# Patient Record
Sex: Female | Born: 1960 | Race: Black or African American | Hispanic: No | Marital: Single | Smoking: Current some day smoker
Health system: Southern US, Community
[De-identification: ages and names within clinical notes are randomized; demographics above are authoritative.]

## PROBLEM LIST (undated history)

## (undated) ENCOUNTER — Ambulatory Visit (HOSPITAL_COMMUNITY): Payer: BC Managed Care – PPO

## (undated) DIAGNOSIS — Z21 Asymptomatic human immunodeficiency virus [HIV] infection status: Secondary | ICD-10-CM

## (undated) DIAGNOSIS — F1910 Other psychoactive substance abuse, uncomplicated: Secondary | ICD-10-CM

## (undated) DIAGNOSIS — F419 Anxiety disorder, unspecified: Secondary | ICD-10-CM

## (undated) DIAGNOSIS — B1920 Unspecified viral hepatitis C without hepatic coma: Secondary | ICD-10-CM

## (undated) DIAGNOSIS — B20 Human immunodeficiency virus [HIV] disease: Secondary | ICD-10-CM

## (undated) DIAGNOSIS — A64 Unspecified sexually transmitted disease: Secondary | ICD-10-CM

## (undated) DIAGNOSIS — I10 Essential (primary) hypertension: Secondary | ICD-10-CM

## (undated) DIAGNOSIS — R011 Cardiac murmur, unspecified: Secondary | ICD-10-CM

## (undated) HISTORY — DX: Human immunodeficiency virus (HIV) disease: B20

## (undated) HISTORY — PX: OTHER SURGICAL HISTORY: SHX169

## (undated) HISTORY — DX: Unspecified viral hepatitis C without hepatic coma: B19.20

## (undated) HISTORY — DX: Other psychoactive substance abuse, uncomplicated: F19.10

## (undated) HISTORY — DX: Essential (primary) hypertension: I10

## (undated) HISTORY — PX: PANCREATECTOMY: SHX1019

## (undated) HISTORY — DX: Unspecified sexually transmitted disease: A64

## (undated) HISTORY — DX: Asymptomatic human immunodeficiency virus (hiv) infection status: Z21

---
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
## 1997-05-21 ENCOUNTER — Inpatient Hospital Stay (HOSPITAL_COMMUNITY): Admission: AD | Admit: 1997-05-21 | Discharge: 1997-05-21 | Payer: Self-pay | Admitting: *Deleted

## 1997-05-24 ENCOUNTER — Encounter (HOSPITAL_COMMUNITY): Admission: RE | Admit: 1997-05-24 | Discharge: 1997-06-18 | Payer: Self-pay | Admitting: Obstetrics & Gynecology

## 1997-05-26 ENCOUNTER — Encounter: Admission: RE | Admit: 1997-05-26 | Discharge: 1997-08-24 | Payer: Self-pay | Admitting: Obstetrics & Gynecology

## 1997-05-31 ENCOUNTER — Encounter: Admission: RE | Admit: 1997-05-31 | Discharge: 1997-05-31 | Payer: Self-pay | Admitting: Internal Medicine

## 1997-06-16 ENCOUNTER — Inpatient Hospital Stay (HOSPITAL_COMMUNITY): Admission: AD | Admit: 1997-06-16 | Discharge: 1997-06-19 | Payer: Self-pay | Admitting: Obstetrics

## 1997-09-22 ENCOUNTER — Ambulatory Visit (HOSPITAL_COMMUNITY): Admission: RE | Admit: 1997-09-22 | Discharge: 1997-09-22 | Payer: Self-pay | Admitting: Internal Medicine

## 1997-10-07 ENCOUNTER — Encounter: Admission: RE | Admit: 1997-10-07 | Discharge: 1997-10-07 | Payer: Self-pay | Admitting: Internal Medicine

## 1997-10-13 ENCOUNTER — Encounter: Admission: RE | Admit: 1997-10-13 | Discharge: 1997-10-13 | Payer: Self-pay | Admitting: Internal Medicine

## 1997-11-05 ENCOUNTER — Encounter: Admission: RE | Admit: 1997-11-05 | Discharge: 1997-11-05 | Payer: Self-pay | Admitting: Internal Medicine

## 1998-02-18 ENCOUNTER — Encounter: Payer: Self-pay | Admitting: Emergency Medicine

## 1998-02-19 ENCOUNTER — Inpatient Hospital Stay (HOSPITAL_COMMUNITY): Admission: EM | Admit: 1998-02-19 | Discharge: 1998-02-21 | Payer: Self-pay | Admitting: Emergency Medicine

## 1998-02-20 ENCOUNTER — Encounter: Payer: Self-pay | Admitting: Internal Medicine

## 1998-08-12 ENCOUNTER — Emergency Department (HOSPITAL_COMMUNITY): Admission: EM | Admit: 1998-08-12 | Discharge: 1998-08-12 | Payer: Self-pay | Admitting: Emergency Medicine

## 1998-08-13 ENCOUNTER — Inpatient Hospital Stay (HOSPITAL_COMMUNITY): Admission: EM | Admit: 1998-08-13 | Discharge: 1998-08-16 | Payer: Self-pay | Admitting: *Deleted

## 1998-08-24 ENCOUNTER — Encounter: Admission: RE | Admit: 1998-08-24 | Discharge: 1998-08-24 | Payer: Self-pay | Admitting: Internal Medicine

## 1998-08-24 ENCOUNTER — Ambulatory Visit (HOSPITAL_COMMUNITY): Admission: RE | Admit: 1998-08-24 | Discharge: 1998-08-24 | Payer: Self-pay | Admitting: Internal Medicine

## 1999-02-20 ENCOUNTER — Encounter: Admission: RE | Admit: 1999-02-20 | Discharge: 1999-02-20 | Payer: Self-pay | Admitting: Internal Medicine

## 1999-05-04 ENCOUNTER — Emergency Department (HOSPITAL_COMMUNITY): Admission: EM | Admit: 1999-05-04 | Discharge: 1999-05-04 | Payer: Self-pay | Admitting: Emergency Medicine

## 1999-05-04 ENCOUNTER — Encounter: Payer: Self-pay | Admitting: Emergency Medicine

## 1999-05-22 ENCOUNTER — Ambulatory Visit (HOSPITAL_COMMUNITY): Admission: RE | Admit: 1999-05-22 | Discharge: 1999-05-22 | Payer: Self-pay | Admitting: Internal Medicine

## 1999-05-22 ENCOUNTER — Encounter: Admission: RE | Admit: 1999-05-22 | Discharge: 1999-05-22 | Payer: Self-pay | Admitting: Internal Medicine

## 1999-10-30 ENCOUNTER — Ambulatory Visit (HOSPITAL_COMMUNITY): Admission: RE | Admit: 1999-10-30 | Discharge: 1999-10-30 | Payer: Self-pay | Admitting: Internal Medicine

## 1999-10-30 ENCOUNTER — Encounter: Admission: RE | Admit: 1999-10-30 | Discharge: 1999-10-30 | Payer: Self-pay | Admitting: Internal Medicine

## 2000-03-26 ENCOUNTER — Ambulatory Visit (HOSPITAL_COMMUNITY): Admission: RE | Admit: 2000-03-26 | Discharge: 2000-03-26 | Payer: Self-pay | Admitting: Internal Medicine

## 2000-03-26 ENCOUNTER — Encounter: Admission: RE | Admit: 2000-03-26 | Discharge: 2000-03-26 | Payer: Self-pay | Admitting: Hematology and Oncology

## 2000-05-08 ENCOUNTER — Emergency Department (HOSPITAL_COMMUNITY): Admission: EM | Admit: 2000-05-08 | Discharge: 2000-05-08 | Payer: Self-pay | Admitting: Emergency Medicine

## 2000-06-05 ENCOUNTER — Emergency Department (HOSPITAL_COMMUNITY): Admission: EM | Admit: 2000-06-05 | Discharge: 2000-06-05 | Payer: Self-pay | Admitting: Emergency Medicine

## 2000-07-29 ENCOUNTER — Encounter: Admission: RE | Admit: 2000-07-29 | Discharge: 2000-07-29 | Payer: Self-pay | Admitting: Internal Medicine

## 2000-07-29 ENCOUNTER — Ambulatory Visit (HOSPITAL_COMMUNITY): Admission: RE | Admit: 2000-07-29 | Discharge: 2000-07-29 | Payer: Self-pay | Admitting: Internal Medicine

## 2000-08-29 ENCOUNTER — Encounter: Admission: RE | Admit: 2000-08-29 | Discharge: 2000-08-29 | Payer: Self-pay | Admitting: Internal Medicine

## 2000-12-17 ENCOUNTER — Encounter: Admission: RE | Admit: 2000-12-17 | Discharge: 2000-12-17 | Payer: Self-pay | Admitting: Internal Medicine

## 2000-12-17 ENCOUNTER — Ambulatory Visit (HOSPITAL_COMMUNITY): Admission: RE | Admit: 2000-12-17 | Discharge: 2000-12-17 | Payer: Self-pay | Admitting: Internal Medicine

## 2000-12-17 ENCOUNTER — Encounter (INDEPENDENT_AMBULATORY_CARE_PROVIDER_SITE_OTHER): Payer: Self-pay | Admitting: *Deleted

## 2001-04-18 ENCOUNTER — Encounter: Payer: Self-pay | Admitting: Emergency Medicine

## 2001-04-18 ENCOUNTER — Emergency Department (HOSPITAL_COMMUNITY): Admission: EM | Admit: 2001-04-18 | Discharge: 2001-04-18 | Payer: Self-pay | Admitting: Emergency Medicine

## 2001-08-13 ENCOUNTER — Encounter: Admission: RE | Admit: 2001-08-13 | Discharge: 2001-08-13 | Payer: Self-pay | Admitting: Internal Medicine

## 2001-08-13 ENCOUNTER — Ambulatory Visit (HOSPITAL_COMMUNITY): Admission: RE | Admit: 2001-08-13 | Discharge: 2001-08-13 | Payer: Self-pay | Admitting: Internal Medicine

## 2001-08-13 ENCOUNTER — Encounter (INDEPENDENT_AMBULATORY_CARE_PROVIDER_SITE_OTHER): Payer: Self-pay | Admitting: *Deleted

## 2001-08-13 ENCOUNTER — Inpatient Hospital Stay (HOSPITAL_COMMUNITY): Admission: EM | Admit: 2001-08-13 | Discharge: 2001-08-15 | Payer: Self-pay | Admitting: Emergency Medicine

## 2001-08-13 ENCOUNTER — Encounter: Payer: Self-pay | Admitting: Internal Medicine

## 2001-08-14 ENCOUNTER — Encounter: Payer: Self-pay | Admitting: Internal Medicine

## 2001-10-02 ENCOUNTER — Encounter: Admission: RE | Admit: 2001-10-02 | Discharge: 2001-10-02 | Payer: Self-pay | Admitting: Obstetrics and Gynecology

## 2001-10-02 ENCOUNTER — Encounter (INDEPENDENT_AMBULATORY_CARE_PROVIDER_SITE_OTHER): Payer: Self-pay | Admitting: Specialist

## 2001-10-02 ENCOUNTER — Other Ambulatory Visit: Admission: RE | Admit: 2001-10-02 | Discharge: 2001-10-02 | Payer: Self-pay | Admitting: Family Medicine

## 2002-02-19 ENCOUNTER — Emergency Department (HOSPITAL_COMMUNITY): Admission: EM | Admit: 2002-02-19 | Discharge: 2002-02-19 | Payer: Self-pay | Admitting: Emergency Medicine

## 2002-04-08 ENCOUNTER — Encounter (INDEPENDENT_AMBULATORY_CARE_PROVIDER_SITE_OTHER): Payer: Self-pay | Admitting: *Deleted

## 2002-04-08 ENCOUNTER — Encounter: Admission: RE | Admit: 2002-04-08 | Discharge: 2002-04-08 | Payer: Self-pay | Admitting: Internal Medicine

## 2002-04-08 LAB — CONVERTED CEMR LAB
CD4 Count: 440 microliters
CD4 T Cell Abs: 440

## 2002-05-25 ENCOUNTER — Encounter: Admission: RE | Admit: 2002-05-25 | Discharge: 2002-05-25 | Payer: Self-pay | Admitting: Infectious Diseases

## 2002-06-23 ENCOUNTER — Other Ambulatory Visit: Admission: RE | Admit: 2002-06-23 | Discharge: 2002-06-23 | Payer: Self-pay | Admitting: *Deleted

## 2002-06-23 ENCOUNTER — Encounter: Admission: RE | Admit: 2002-06-23 | Discharge: 2002-06-23 | Payer: Self-pay | Admitting: Obstetrics and Gynecology

## 2002-06-23 ENCOUNTER — Encounter (INDEPENDENT_AMBULATORY_CARE_PROVIDER_SITE_OTHER): Payer: Self-pay | Admitting: *Deleted

## 2002-09-07 ENCOUNTER — Encounter: Admission: RE | Admit: 2002-09-07 | Discharge: 2002-09-07 | Payer: Self-pay | Admitting: Internal Medicine

## 2002-12-31 ENCOUNTER — Encounter: Admission: RE | Admit: 2002-12-31 | Discharge: 2002-12-31 | Payer: Self-pay | Admitting: Internal Medicine

## 2002-12-31 ENCOUNTER — Ambulatory Visit (HOSPITAL_COMMUNITY): Admission: RE | Admit: 2002-12-31 | Discharge: 2002-12-31 | Payer: Self-pay | Admitting: Internal Medicine

## 2003-02-04 ENCOUNTER — Encounter: Admission: RE | Admit: 2003-02-04 | Discharge: 2003-02-04 | Payer: Self-pay | Admitting: Internal Medicine

## 2003-04-21 ENCOUNTER — Encounter: Admission: RE | Admit: 2003-04-21 | Discharge: 2003-04-21 | Payer: Self-pay | Admitting: Internal Medicine

## 2003-05-31 ENCOUNTER — Ambulatory Visit (HOSPITAL_COMMUNITY): Admission: RE | Admit: 2003-05-31 | Discharge: 2003-05-31 | Payer: Self-pay | Admitting: Internal Medicine

## 2003-05-31 ENCOUNTER — Encounter: Admission: RE | Admit: 2003-05-31 | Discharge: 2003-05-31 | Payer: Self-pay | Admitting: Internal Medicine

## 2003-06-03 ENCOUNTER — Encounter: Admission: RE | Admit: 2003-06-03 | Discharge: 2003-06-03 | Payer: Self-pay | Admitting: Internal Medicine

## 2003-06-03 ENCOUNTER — Ambulatory Visit (HOSPITAL_COMMUNITY): Admission: RE | Admit: 2003-06-03 | Discharge: 2003-06-03 | Payer: Self-pay | Admitting: Internal Medicine

## 2003-07-01 ENCOUNTER — Encounter: Admission: RE | Admit: 2003-07-01 | Discharge: 2003-07-01 | Payer: Self-pay | Admitting: Internal Medicine

## 2003-09-20 ENCOUNTER — Ambulatory Visit (HOSPITAL_COMMUNITY): Admission: RE | Admit: 2003-09-20 | Discharge: 2003-09-20 | Payer: Self-pay | Admitting: Infectious Diseases

## 2003-09-20 ENCOUNTER — Encounter: Admission: RE | Admit: 2003-09-20 | Discharge: 2003-09-20 | Payer: Self-pay | Admitting: Infectious Diseases

## 2003-10-27 ENCOUNTER — Ambulatory Visit: Payer: Self-pay | Admitting: Infectious Diseases

## 2004-01-31 ENCOUNTER — Ambulatory Visit (HOSPITAL_COMMUNITY): Admission: RE | Admit: 2004-01-31 | Discharge: 2004-01-31 | Payer: Self-pay | Admitting: Infectious Diseases

## 2004-01-31 ENCOUNTER — Ambulatory Visit: Payer: Self-pay | Admitting: Infectious Diseases

## 2004-04-12 ENCOUNTER — Ambulatory Visit: Payer: Self-pay | Admitting: Infectious Diseases

## 2004-04-12 ENCOUNTER — Ambulatory Visit (HOSPITAL_COMMUNITY): Admission: RE | Admit: 2004-04-12 | Discharge: 2004-04-12 | Payer: Self-pay | Admitting: Infectious Diseases

## 2004-04-18 ENCOUNTER — Ambulatory Visit (HOSPITAL_COMMUNITY): Admission: RE | Admit: 2004-04-18 | Discharge: 2004-04-18 | Payer: Self-pay | Admitting: Infectious Diseases

## 2004-05-02 ENCOUNTER — Other Ambulatory Visit: Admission: RE | Admit: 2004-05-02 | Discharge: 2004-05-02 | Payer: Self-pay | Admitting: Obstetrics and Gynecology

## 2004-07-20 ENCOUNTER — Ambulatory Visit: Payer: Self-pay | Admitting: Infectious Diseases

## 2004-07-20 ENCOUNTER — Ambulatory Visit (HOSPITAL_COMMUNITY): Admission: RE | Admit: 2004-07-20 | Discharge: 2004-07-20 | Payer: Self-pay | Admitting: Infectious Diseases

## 2004-08-09 ENCOUNTER — Ambulatory Visit: Payer: Self-pay | Admitting: Infectious Diseases

## 2004-10-26 ENCOUNTER — Emergency Department (HOSPITAL_COMMUNITY): Admission: EM | Admit: 2004-10-26 | Discharge: 2004-10-26 | Payer: Self-pay | Admitting: Emergency Medicine

## 2004-11-08 ENCOUNTER — Ambulatory Visit (HOSPITAL_COMMUNITY): Admission: RE | Admit: 2004-11-08 | Discharge: 2004-11-08 | Payer: Self-pay | Admitting: Infectious Diseases

## 2004-11-08 ENCOUNTER — Ambulatory Visit: Payer: Self-pay | Admitting: Infectious Diseases

## 2004-11-20 ENCOUNTER — Ambulatory Visit: Payer: Self-pay | Admitting: Internal Medicine

## 2005-01-31 ENCOUNTER — Ambulatory Visit (HOSPITAL_COMMUNITY): Admission: RE | Admit: 2005-01-31 | Discharge: 2005-01-31 | Payer: Self-pay | Admitting: Infectious Diseases

## 2005-01-31 ENCOUNTER — Ambulatory Visit: Payer: Self-pay | Admitting: Infectious Diseases

## 2005-03-14 ENCOUNTER — Ambulatory Visit: Payer: Self-pay | Admitting: Infectious Diseases

## 2005-03-14 ENCOUNTER — Encounter (INDEPENDENT_AMBULATORY_CARE_PROVIDER_SITE_OTHER): Payer: Self-pay | Admitting: *Deleted

## 2005-03-14 LAB — CONVERTED CEMR LAB
CD4 Count: 940 microliters
HIV 1 RNA Quant: 399 copies/mL

## 2005-03-21 ENCOUNTER — Ambulatory Visit: Payer: Self-pay | Admitting: Infectious Diseases

## 2005-04-18 ENCOUNTER — Ambulatory Visit (HOSPITAL_COMMUNITY): Admission: RE | Admit: 2005-04-18 | Discharge: 2005-04-18 | Payer: Self-pay | Admitting: Internal Medicine

## 2005-06-14 ENCOUNTER — Encounter: Payer: Self-pay | Admitting: Obstetrics and Gynecology

## 2005-06-14 ENCOUNTER — Other Ambulatory Visit: Admission: RE | Admit: 2005-06-14 | Discharge: 2005-06-14 | Payer: Self-pay | Admitting: Obstetrics and Gynecology

## 2005-06-14 ENCOUNTER — Encounter (INDEPENDENT_AMBULATORY_CARE_PROVIDER_SITE_OTHER): Payer: Self-pay | Admitting: Specialist

## 2005-06-14 ENCOUNTER — Ambulatory Visit: Payer: Self-pay | Admitting: Obstetrics and Gynecology

## 2005-06-20 ENCOUNTER — Encounter (INDEPENDENT_AMBULATORY_CARE_PROVIDER_SITE_OTHER): Payer: Self-pay | Admitting: *Deleted

## 2005-06-20 ENCOUNTER — Ambulatory Visit: Payer: Self-pay | Admitting: Infectious Diseases

## 2005-06-20 ENCOUNTER — Encounter: Admission: RE | Admit: 2005-06-20 | Discharge: 2005-06-20 | Payer: Self-pay | Admitting: Infectious Diseases

## 2005-06-20 LAB — CONVERTED CEMR LAB
CD4 Count: 1140 microliters
HIV 1 RNA Quant: 399 copies/mL

## 2005-08-01 ENCOUNTER — Ambulatory Visit: Payer: Self-pay | Admitting: Internal Medicine

## 2005-08-03 ENCOUNTER — Ambulatory Visit (HOSPITAL_COMMUNITY): Admission: RE | Admit: 2005-08-03 | Discharge: 2005-08-03 | Payer: Self-pay | Admitting: Internal Medicine

## 2005-10-24 ENCOUNTER — Encounter: Admission: RE | Admit: 2005-10-24 | Discharge: 2005-10-24 | Payer: Self-pay | Admitting: Infectious Diseases

## 2005-10-24 ENCOUNTER — Encounter (INDEPENDENT_AMBULATORY_CARE_PROVIDER_SITE_OTHER): Payer: Self-pay | Admitting: *Deleted

## 2005-10-24 ENCOUNTER — Ambulatory Visit: Payer: Self-pay | Admitting: Infectious Diseases

## 2005-10-24 LAB — CONVERTED CEMR LAB
CD4 Count: 970 microliters
HIV 1 RNA Quant: 49 copies/mL

## 2005-12-03 ENCOUNTER — Ambulatory Visit: Payer: Self-pay | Admitting: Infectious Diseases

## 2005-12-14 DIAGNOSIS — Z8709 Personal history of other diseases of the respiratory system: Secondary | ICD-10-CM | POA: Insufficient documentation

## 2005-12-14 DIAGNOSIS — Z9189 Other specified personal risk factors, not elsewhere classified: Secondary | ICD-10-CM

## 2005-12-14 DIAGNOSIS — Z720 Tobacco use: Secondary | ICD-10-CM | POA: Insufficient documentation

## 2005-12-14 DIAGNOSIS — F17200 Nicotine dependence, unspecified, uncomplicated: Secondary | ICD-10-CM

## 2005-12-14 DIAGNOSIS — B1710 Acute hepatitis C without hepatic coma: Secondary | ICD-10-CM

## 2005-12-14 DIAGNOSIS — F1910 Other psychoactive substance abuse, uncomplicated: Secondary | ICD-10-CM

## 2005-12-14 DIAGNOSIS — K219 Gastro-esophageal reflux disease without esophagitis: Secondary | ICD-10-CM | POA: Insufficient documentation

## 2005-12-14 DIAGNOSIS — B20 Human immunodeficiency virus [HIV] disease: Secondary | ICD-10-CM

## 2005-12-14 DIAGNOSIS — D539 Nutritional anemia, unspecified: Secondary | ICD-10-CM | POA: Insufficient documentation

## 2006-01-18 ENCOUNTER — Ambulatory Visit: Payer: Self-pay | Admitting: Internal Medicine

## 2006-02-07 ENCOUNTER — Ambulatory Visit: Payer: Self-pay | Admitting: Obstetrics and Gynecology

## 2006-04-03 ENCOUNTER — Ambulatory Visit: Payer: Self-pay | Admitting: Infectious Diseases

## 2006-04-03 ENCOUNTER — Encounter: Admission: RE | Admit: 2006-04-03 | Discharge: 2006-04-03 | Payer: Self-pay | Admitting: Infectious Diseases

## 2006-04-03 DIAGNOSIS — N949 Unspecified condition associated with female genital organs and menstrual cycle: Secondary | ICD-10-CM

## 2006-04-03 LAB — CONVERTED CEMR LAB
ALT: 21 units/L (ref 0–35)
AST: 22 units/L (ref 0–37)
Albumin: 4.1 g/dL (ref 3.5–5.2)
Alkaline Phosphatase: 127 units/L — ABNORMAL HIGH (ref 39–117)
BUN: 11 mg/dL (ref 6–23)
CD4 Count: 790 microliters
CO2: 23 meq/L (ref 19–32)
Calcium: 9.1 mg/dL (ref 8.4–10.5)
Chloride: 109 meq/L (ref 96–112)
Cholesterol: 128 mg/dL (ref 0–200)
Creatinine, Ser: 0.91 mg/dL (ref 0.40–1.20)
Glucose, Bld: 76 mg/dL (ref 70–99)
HCT: 38.2 % (ref 36.0–46.0)
HDL: 36 mg/dL — ABNORMAL LOW (ref >39)
HIV 1 RNA Quant: 89 copies/mL — ABNORMAL HIGH (ref <50)
HIV-1 RNA Quant, Log: 1.95 — ABNORMAL HIGH (ref <1.70)
Hemoglobin: 12 g/dL (ref 12.0–15.0)
LDL Cholesterol: 69 mg/dL (ref 0–99)
MCHC: 31.4 g/dL (ref 30.0–36.0)
MCV: 95.5 fL (ref 78.0–100.0)
Platelets: 296 10*3/uL (ref 150–400)
Potassium: 4.5 meq/L (ref 3.5–5.3)
RBC: 4 M/uL (ref 3.87–5.11)
RDW: 12.8 % (ref 11.5–14.0)
Sodium: 141 meq/L (ref 135–145)
Total Bilirubin: 0.3 mg/dL (ref 0.3–1.2)
Total CHOL/HDL Ratio: 3.6
Total Protein: 7.7 g/dL (ref 6.0–8.3)
Triglycerides: 116 mg/dL (ref <150)
VLDL: 23 mg/dL (ref 0–40)
WBC: 4.7 10*3/uL (ref 4.0–10.5)

## 2006-04-05 ENCOUNTER — Ambulatory Visit (HOSPITAL_COMMUNITY): Admission: RE | Admit: 2006-04-05 | Discharge: 2006-04-05 | Payer: Self-pay | Admitting: Infectious Diseases

## 2006-04-08 ENCOUNTER — Encounter (INDEPENDENT_AMBULATORY_CARE_PROVIDER_SITE_OTHER): Payer: Self-pay | Admitting: *Deleted

## 2006-04-08 LAB — CONVERTED CEMR LAB: HCV Quantitative: 1130000 intl units/mL

## 2006-04-09 ENCOUNTER — Ambulatory Visit (HOSPITAL_COMMUNITY): Admission: RE | Admit: 2006-04-09 | Discharge: 2006-04-09 | Payer: Self-pay | Admitting: Internal Medicine

## 2006-04-12 ENCOUNTER — Encounter: Payer: Self-pay | Admitting: Infectious Diseases

## 2006-04-19 ENCOUNTER — Telehealth (INDEPENDENT_AMBULATORY_CARE_PROVIDER_SITE_OTHER): Payer: Self-pay | Admitting: Infectious Diseases

## 2006-04-21 ENCOUNTER — Encounter (INDEPENDENT_AMBULATORY_CARE_PROVIDER_SITE_OTHER): Payer: Self-pay | Admitting: *Deleted

## 2006-04-29 ENCOUNTER — Telehealth (INDEPENDENT_AMBULATORY_CARE_PROVIDER_SITE_OTHER): Payer: Self-pay | Admitting: Infectious Diseases

## 2006-05-20 ENCOUNTER — Telehealth: Payer: Self-pay | Admitting: Infectious Diseases

## 2006-05-27 ENCOUNTER — Telehealth: Payer: Self-pay | Admitting: Infectious Diseases

## 2006-06-25 ENCOUNTER — Telehealth: Payer: Self-pay | Admitting: Infectious Diseases

## 2006-07-09 ENCOUNTER — Ambulatory Visit: Payer: Self-pay | Admitting: Infectious Diseases

## 2006-07-09 ENCOUNTER — Encounter: Admission: RE | Admit: 2006-07-09 | Discharge: 2006-07-09 | Payer: Self-pay | Admitting: Infectious Diseases

## 2006-07-22 ENCOUNTER — Telehealth: Payer: Self-pay | Admitting: Infectious Diseases

## 2006-07-23 ENCOUNTER — Telehealth: Payer: Self-pay | Admitting: Infectious Diseases

## 2006-07-24 ENCOUNTER — Ambulatory Visit: Payer: Self-pay | Admitting: Infectious Diseases

## 2006-07-24 ENCOUNTER — Encounter: Admission: RE | Admit: 2006-07-24 | Discharge: 2006-07-24 | Payer: Self-pay | Admitting: Infectious Diseases

## 2006-07-24 DIAGNOSIS — F329 Major depressive disorder, single episode, unspecified: Secondary | ICD-10-CM

## 2006-07-24 DIAGNOSIS — F32A Depression, unspecified: Secondary | ICD-10-CM | POA: Insufficient documentation

## 2006-07-26 ENCOUNTER — Telehealth: Payer: Self-pay | Admitting: Infectious Diseases

## 2006-07-26 ENCOUNTER — Ambulatory Visit: Payer: Self-pay | Admitting: Infectious Diseases

## 2006-07-26 ENCOUNTER — Encounter: Payer: Self-pay | Admitting: Infectious Diseases

## 2006-07-29 ENCOUNTER — Encounter: Payer: Self-pay | Admitting: Infectious Diseases

## 2006-07-29 LAB — CONVERTED CEMR LAB: Pap Smear: NORMAL

## 2006-08-01 ENCOUNTER — Encounter: Payer: Self-pay | Admitting: Infectious Diseases

## 2006-08-01 ENCOUNTER — Encounter (INDEPENDENT_AMBULATORY_CARE_PROVIDER_SITE_OTHER): Payer: Self-pay | Admitting: *Deleted

## 2006-08-23 ENCOUNTER — Telehealth: Payer: Self-pay | Admitting: Infectious Diseases

## 2006-09-24 ENCOUNTER — Telehealth: Payer: Self-pay | Admitting: Infectious Diseases

## 2006-09-30 ENCOUNTER — Telehealth: Payer: Self-pay | Admitting: Infectious Diseases

## 2006-10-11 ENCOUNTER — Telehealth: Payer: Self-pay | Admitting: Infectious Diseases

## 2006-10-24 ENCOUNTER — Encounter: Admission: RE | Admit: 2006-10-24 | Discharge: 2006-10-24 | Payer: Self-pay | Admitting: Infectious Diseases

## 2006-10-24 ENCOUNTER — Ambulatory Visit: Payer: Self-pay | Admitting: Infectious Diseases

## 2006-10-24 LAB — CONVERTED CEMR LAB
ALT: 26 units/L (ref 0–35)
AST: 26 units/L (ref 0–37)
Albumin: 4.1 g/dL (ref 3.5–5.2)
Alkaline Phosphatase: 115 units/L (ref 39–117)
BUN: 12 mg/dL (ref 6–23)
Basophils Absolute: 0 10*3/uL (ref 0.0–0.1)
Basophils Relative: 0 % (ref 0–1)
CO2: 22 meq/L (ref 19–32)
Calcium: 8.8 mg/dL (ref 8.4–10.5)
Chloride: 108 meq/L (ref 96–112)
Creatinine, Ser: 0.88 mg/dL (ref 0.40–1.20)
Eosinophils Absolute: 0.1 10*3/uL (ref 0.0–0.7)
Eosinophils Relative: 2 % (ref 0–5)
Glucose, Bld: 86 mg/dL (ref 70–99)
HCT: 36.3 % (ref 36.0–46.0)
HIV 1 RNA Quant: <50 copies/mL (ref <50)
HIV-1 RNA Quant, Log: <1.7 (ref <1.70)
Hemoglobin: 12 g/dL (ref 12.0–15.0)
Lymphocytes Relative: 50 % — ABNORMAL HIGH (ref 12–46)
Lymphs Abs: 2.6 10*3/uL (ref 0.7–3.3)
MCHC: 33.1 g/dL (ref 30.0–36.0)
MCV: 94 fL (ref 78.0–100.0)
Monocytes Absolute: 0.5 10*3/uL (ref 0.2–0.7)
Monocytes Relative: 9 % (ref 3–11)
Neutro Abs: 2.1 10*3/uL (ref 1.7–7.7)
Neutrophils Relative %: 40 % — ABNORMAL LOW (ref 43–77)
Platelets: 222 10*3/uL (ref 150–400)
Potassium: 4.2 meq/L (ref 3.5–5.3)
RBC: 3.86 M/uL — ABNORMAL LOW (ref 3.87–5.11)
RDW: 13 % (ref 11.5–14.0)
Sodium: 139 meq/L (ref 135–145)
Total Bilirubin: 0.3 mg/dL (ref 0.3–1.2)
Total Protein: 7.6 g/dL (ref 6.0–8.3)
WBC: 5.2 10*3/uL (ref 4.0–10.5)

## 2006-10-28 ENCOUNTER — Telehealth: Payer: Self-pay | Admitting: Infectious Diseases

## 2006-11-11 ENCOUNTER — Telehealth: Payer: Self-pay | Admitting: Infectious Diseases

## 2006-11-20 ENCOUNTER — Ambulatory Visit: Payer: Self-pay | Admitting: Infectious Diseases

## 2006-11-20 DIAGNOSIS — N63 Unspecified lump in unspecified breast: Secondary | ICD-10-CM

## 2006-11-21 ENCOUNTER — Encounter (INDEPENDENT_AMBULATORY_CARE_PROVIDER_SITE_OTHER): Payer: Self-pay | Admitting: *Deleted

## 2006-11-22 ENCOUNTER — Telehealth: Payer: Self-pay | Admitting: Infectious Diseases

## 2006-11-22 ENCOUNTER — Encounter: Admission: RE | Admit: 2006-11-22 | Discharge: 2006-11-22 | Payer: Self-pay | Admitting: Internal Medicine

## 2006-12-19 ENCOUNTER — Telehealth: Payer: Self-pay | Admitting: Infectious Diseases

## 2007-01-22 ENCOUNTER — Telehealth: Payer: Self-pay | Admitting: Infectious Diseases

## 2007-02-12 ENCOUNTER — Encounter (INDEPENDENT_AMBULATORY_CARE_PROVIDER_SITE_OTHER): Payer: Self-pay | Admitting: *Deleted

## 2007-02-24 ENCOUNTER — Telehealth: Payer: Self-pay | Admitting: Infectious Diseases

## 2007-03-03 ENCOUNTER — Ambulatory Visit: Payer: Self-pay | Admitting: Infectious Diseases

## 2007-03-03 ENCOUNTER — Encounter: Admission: RE | Admit: 2007-03-03 | Discharge: 2007-03-03 | Payer: Self-pay | Admitting: Infectious Diseases

## 2007-03-03 LAB — CONVERTED CEMR LAB
AST: 20 units/L (ref 0–37)
Albumin: 4.2 g/dL (ref 3.5–5.2)
Alkaline Phosphatase: 108 units/L (ref 39–117)
BUN: 12 mg/dL (ref 6–23)
Basophils Relative: 0 % (ref 0–1)
CO2: 23 meq/L (ref 19–32)
Chloride: 109 meq/L (ref 96–112)
Cholesterol: 123 mg/dL (ref 0–200)
Creatinine, Ser: 0.95 mg/dL (ref 0.40–1.20)
Eosinophils Absolute: 0.1 10*3/uL (ref 0.0–0.7)
HDL: 41 mg/dL (ref >39)
HIV-1 RNA Quant, Log: 2.09 — ABNORMAL HIGH (ref <1.70)
LDL Cholesterol: 64 mg/dL (ref 0–99)
Lymphocytes Relative: 54 % — ABNORMAL HIGH (ref 12–46)
Monocytes Absolute: 0.4 10*3/uL (ref 0.1–1.0)
Platelets: 221 10*3/uL (ref 150–400)
RBC: 4.04 M/uL (ref 3.87–5.11)
RDW: 13.3 % (ref 11.5–15.5)
Sodium: 143 meq/L (ref 135–145)
VLDL: 18 mg/dL (ref 0–40)
WBC: 6.4 10*3/uL (ref 4.0–10.5)

## 2007-03-04 ENCOUNTER — Telehealth: Payer: Self-pay | Admitting: Infectious Diseases

## 2007-03-20 ENCOUNTER — Ambulatory Visit: Payer: Self-pay | Admitting: Infectious Diseases

## 2007-03-20 ENCOUNTER — Encounter: Payer: Self-pay | Admitting: Infectious Diseases

## 2007-03-21 ENCOUNTER — Encounter (INDEPENDENT_AMBULATORY_CARE_PROVIDER_SITE_OTHER): Payer: Self-pay | Admitting: *Deleted

## 2007-03-26 ENCOUNTER — Encounter: Payer: Self-pay | Admitting: Infectious Diseases

## 2007-04-01 ENCOUNTER — Telehealth: Payer: Self-pay | Admitting: Infectious Diseases

## 2007-04-29 ENCOUNTER — Telehealth: Payer: Self-pay | Admitting: Infectious Diseases

## 2007-04-30 ENCOUNTER — Encounter: Admission: RE | Admit: 2007-04-30 | Discharge: 2007-04-30 | Payer: Self-pay | Admitting: Infectious Diseases

## 2007-05-19 ENCOUNTER — Encounter: Admission: RE | Admit: 2007-05-19 | Discharge: 2007-05-19 | Payer: Self-pay | Admitting: Infectious Diseases

## 2007-05-28 ENCOUNTER — Telehealth (INDEPENDENT_AMBULATORY_CARE_PROVIDER_SITE_OTHER): Payer: Self-pay | Admitting: *Deleted

## 2007-06-02 ENCOUNTER — Encounter: Payer: Self-pay | Admitting: Infectious Diseases

## 2007-06-30 ENCOUNTER — Telehealth (INDEPENDENT_AMBULATORY_CARE_PROVIDER_SITE_OTHER): Payer: Self-pay | Admitting: *Deleted

## 2007-07-08 ENCOUNTER — Telehealth: Payer: Self-pay | Admitting: Infectious Diseases

## 2007-07-08 ENCOUNTER — Encounter: Admission: RE | Admit: 2007-07-08 | Discharge: 2007-07-08 | Payer: Self-pay | Admitting: Infectious Diseases

## 2007-07-08 ENCOUNTER — Ambulatory Visit: Payer: Self-pay | Admitting: Infectious Diseases

## 2007-07-08 LAB — CONVERTED CEMR LAB
AST: 31 units/L (ref 0–37)
Alkaline Phosphatase: 104 units/L (ref 39–117)
BUN: 12 mg/dL (ref 6–23)
Basophils Relative: 0 % (ref 0–1)
CO2: 24 meq/L (ref 19–32)
Calcium: 9.1 mg/dL (ref 8.4–10.5)
Creatinine, Ser: 0.9 mg/dL (ref 0.40–1.20)
Eosinophils Absolute: 0.1 10*3/uL (ref 0.0–0.7)
HCT: 39.7 % (ref 36.0–46.0)
HIV 1 RNA Quant: 62 copies/mL — ABNORMAL HIGH (ref <50)
Lymphocytes Relative: 52 % — ABNORMAL HIGH (ref 12–46)
Lymphs Abs: 2.9 10*3/uL (ref 0.7–4.0)
MCV: 93.6 fL (ref 78.0–100.0)
Monocytes Absolute: 0.4 10*3/uL (ref 0.1–1.0)
Neutro Abs: 2.1 10*3/uL (ref 1.7–7.7)
RDW: 12.7 % (ref 11.5–15.5)

## 2007-07-23 ENCOUNTER — Ambulatory Visit: Payer: Self-pay | Admitting: Infectious Diseases

## 2007-07-23 DIAGNOSIS — S025XXA Fracture of tooth (traumatic), initial encounter for closed fracture: Secondary | ICD-10-CM

## 2007-07-28 ENCOUNTER — Telehealth (INDEPENDENT_AMBULATORY_CARE_PROVIDER_SITE_OTHER): Payer: Self-pay | Admitting: *Deleted

## 2007-08-20 ENCOUNTER — Telehealth: Payer: Self-pay

## 2007-08-21 ENCOUNTER — Encounter: Payer: Self-pay | Admitting: Infectious Diseases

## 2007-08-26 ENCOUNTER — Telehealth (INDEPENDENT_AMBULATORY_CARE_PROVIDER_SITE_OTHER): Payer: Self-pay | Admitting: *Deleted

## 2007-09-25 ENCOUNTER — Telehealth (INDEPENDENT_AMBULATORY_CARE_PROVIDER_SITE_OTHER): Payer: Self-pay | Admitting: *Deleted

## 2007-10-27 ENCOUNTER — Telehealth (INDEPENDENT_AMBULATORY_CARE_PROVIDER_SITE_OTHER): Payer: Self-pay | Admitting: *Deleted

## 2007-11-07 ENCOUNTER — Ambulatory Visit: Payer: Self-pay | Admitting: Internal Medicine

## 2007-11-07 DIAGNOSIS — G609 Hereditary and idiopathic neuropathy, unspecified: Secondary | ICD-10-CM | POA: Insufficient documentation

## 2007-11-13 ENCOUNTER — Telehealth (INDEPENDENT_AMBULATORY_CARE_PROVIDER_SITE_OTHER): Payer: Self-pay | Admitting: *Deleted

## 2007-11-25 ENCOUNTER — Telehealth (INDEPENDENT_AMBULATORY_CARE_PROVIDER_SITE_OTHER): Payer: Self-pay | Admitting: *Deleted

## 2007-12-01 ENCOUNTER — Ambulatory Visit: Payer: Self-pay | Admitting: Infectious Diseases

## 2007-12-01 ENCOUNTER — Encounter (INDEPENDENT_AMBULATORY_CARE_PROVIDER_SITE_OTHER): Payer: Self-pay | Admitting: *Deleted

## 2007-12-01 LAB — CONVERTED CEMR LAB
ALT: 29 units/L (ref 0–35)
Albumin: 4.1 g/dL (ref 3.5–5.2)
Alkaline Phosphatase: 105 units/L (ref 39–117)
BUN: 12 mg/dL (ref 6–23)
Basophils Absolute: 0 10*3/uL (ref 0.0–0.1)
Basophils Relative: 0 % (ref 0–1)
Calcium: 9 mg/dL (ref 8.4–10.5)
Chloride: 107 meq/L (ref 96–112)
Creatinine, Ser: 0.94 mg/dL (ref 0.40–1.20)
Eosinophils Relative: 2 % (ref 0–5)
Glucose, Bld: 89 mg/dL (ref 70–99)
HCT: 39.1 % (ref 36.0–46.0)
Hemoglobin: 12.5 g/dL (ref 12.0–15.0)
MCHC: 32 g/dL (ref 30.0–36.0)
MCV: 92.9 fL (ref 78.0–100.0)
Neutrophils Relative %: 40 % — ABNORMAL LOW (ref 43–77)
RBC: 4.21 M/uL (ref 3.87–5.11)
RDW: 12.6 % (ref 11.5–15.5)
Total Bilirubin: 0.3 mg/dL (ref 0.3–1.2)
WBC: 5.9 10*3/uL (ref 4.0–10.5)

## 2007-12-06 ENCOUNTER — Encounter: Payer: Self-pay | Admitting: Infectious Diseases

## 2007-12-06 ENCOUNTER — Inpatient Hospital Stay (HOSPITAL_COMMUNITY): Admission: AD | Admit: 2007-12-06 | Discharge: 2007-12-06 | Payer: Self-pay | Admitting: Obstetrics & Gynecology

## 2007-12-15 ENCOUNTER — Ambulatory Visit: Payer: Self-pay | Admitting: Infectious Diseases

## 2007-12-15 DIAGNOSIS — K8020 Calculus of gallbladder without cholecystitis without obstruction: Secondary | ICD-10-CM | POA: Insufficient documentation

## 2007-12-23 ENCOUNTER — Telehealth (INDEPENDENT_AMBULATORY_CARE_PROVIDER_SITE_OTHER): Payer: Self-pay | Admitting: *Deleted

## 2007-12-30 ENCOUNTER — Telehealth: Payer: Self-pay | Admitting: Internal Medicine

## 2008-01-09 ENCOUNTER — Ambulatory Visit: Payer: Self-pay | Admitting: Gastroenterology

## 2008-01-22 ENCOUNTER — Telehealth (INDEPENDENT_AMBULATORY_CARE_PROVIDER_SITE_OTHER): Payer: Self-pay | Admitting: *Deleted

## 2008-02-23 ENCOUNTER — Telehealth (INDEPENDENT_AMBULATORY_CARE_PROVIDER_SITE_OTHER): Payer: Self-pay | Admitting: *Deleted

## 2008-03-10 ENCOUNTER — Encounter (INDEPENDENT_AMBULATORY_CARE_PROVIDER_SITE_OTHER): Payer: Self-pay | Admitting: *Deleted

## 2008-03-24 ENCOUNTER — Telehealth (INDEPENDENT_AMBULATORY_CARE_PROVIDER_SITE_OTHER): Payer: Self-pay | Admitting: *Deleted

## 2008-03-31 ENCOUNTER — Ambulatory Visit: Payer: Self-pay | Admitting: Infectious Diseases

## 2008-03-31 LAB — CONVERTED CEMR LAB
BUN: 11 mg/dL (ref 6–23)
CO2: 21 meq/L (ref 19–32)
Calcium: 9.3 mg/dL (ref 8.4–10.5)
Chloride: 110 meq/L (ref 96–112)
Cholesterol: 128 mg/dL (ref 0–200)
Creatinine, Ser: 0.81 mg/dL (ref 0.40–1.20)
Eosinophils Relative: 1 % (ref 0–5)
HCT: 39.8 % (ref 36.0–46.0)
HDL: 39 mg/dL — ABNORMAL LOW (ref >39)
HIV 1 RNA Quant: <48 copies/mL (ref <48)
Hemoglobin: 12.6 g/dL (ref 12.0–15.0)
Lymphocytes Relative: 45 % (ref 12–46)
MCHC: 31.7 g/dL (ref 30.0–36.0)
Monocytes Absolute: 0.4 10*3/uL (ref 0.1–1.0)
Monocytes Relative: 6 % (ref 3–12)
Neutro Abs: 3 10*3/uL (ref 1.7–7.7)
RBC: 4.11 M/uL (ref 3.87–5.11)
Total Bilirubin: 0.4 mg/dL (ref 0.3–1.2)
Total CHOL/HDL Ratio: 3.3
Triglycerides: 130 mg/dL (ref <150)

## 2008-04-14 ENCOUNTER — Ambulatory Visit: Payer: Self-pay | Admitting: Infectious Diseases

## 2008-04-14 DIAGNOSIS — R51 Headache: Secondary | ICD-10-CM

## 2008-04-14 DIAGNOSIS — R519 Headache, unspecified: Secondary | ICD-10-CM | POA: Insufficient documentation

## 2008-04-20 ENCOUNTER — Telehealth (INDEPENDENT_AMBULATORY_CARE_PROVIDER_SITE_OTHER): Payer: Self-pay | Admitting: *Deleted

## 2008-04-26 ENCOUNTER — Encounter: Payer: Self-pay | Admitting: Infectious Diseases

## 2008-04-26 ENCOUNTER — Telehealth: Payer: Self-pay

## 2008-04-28 ENCOUNTER — Encounter (INDEPENDENT_AMBULATORY_CARE_PROVIDER_SITE_OTHER): Payer: Self-pay | Admitting: *Deleted

## 2008-05-07 ENCOUNTER — Encounter: Payer: Self-pay | Admitting: Infectious Diseases

## 2008-05-12 ENCOUNTER — Telehealth (INDEPENDENT_AMBULATORY_CARE_PROVIDER_SITE_OTHER): Payer: Self-pay | Admitting: *Deleted

## 2008-05-12 ENCOUNTER — Encounter: Payer: Self-pay | Admitting: Infectious Diseases

## 2008-05-19 ENCOUNTER — Encounter: Payer: Self-pay | Admitting: Infectious Diseases

## 2008-06-01 ENCOUNTER — Ambulatory Visit (HOSPITAL_COMMUNITY): Admission: RE | Admit: 2008-06-01 | Discharge: 2008-06-01 | Payer: Self-pay | Admitting: General Surgery

## 2008-06-01 ENCOUNTER — Encounter (INDEPENDENT_AMBULATORY_CARE_PROVIDER_SITE_OTHER): Payer: Self-pay | Admitting: General Surgery

## 2008-06-18 ENCOUNTER — Encounter: Payer: Self-pay | Admitting: Infectious Diseases

## 2008-07-13 ENCOUNTER — Telehealth (INDEPENDENT_AMBULATORY_CARE_PROVIDER_SITE_OTHER): Payer: Self-pay | Admitting: *Deleted

## 2008-08-04 ENCOUNTER — Ambulatory Visit: Payer: Self-pay | Admitting: Infectious Diseases

## 2008-08-04 LAB — CONVERTED CEMR LAB
AST: 19 units/L (ref 0–37)
Albumin: 4.1 g/dL (ref 3.5–5.2)
BUN: 14 mg/dL (ref 6–23)
Basophils Relative: 0 % (ref 0–1)
CO2: 22 meq/L (ref 19–32)
Calcium: 9.5 mg/dL (ref 8.4–10.5)
Chloride: 110 meq/L (ref 96–112)
Eosinophils Absolute: 0.1 10*3/uL (ref 0.0–0.7)
GFR calc Af Amer: 58 mL/min — ABNORMAL LOW (ref >60)
Glucose, Bld: 80 mg/dL (ref 70–99)
Lymphs Abs: 3.3 10*3/uL (ref 0.7–4.0)
MCV: 95.3 fL (ref 78.0–100.0)
Neutrophils Relative %: 44 % (ref 43–77)
Platelets: 235 10*3/uL (ref 150–400)
Potassium: 5.3 meq/L (ref 3.5–5.3)
WBC: 7.3 10*3/uL (ref 4.0–10.5)

## 2008-08-05 ENCOUNTER — Ambulatory Visit: Payer: Self-pay | Admitting: Obstetrics and Gynecology

## 2008-08-05 ENCOUNTER — Encounter: Payer: Self-pay | Admitting: Obstetrics & Gynecology

## 2008-08-10 ENCOUNTER — Encounter: Payer: Self-pay | Admitting: Infectious Diseases

## 2008-08-10 ENCOUNTER — Telehealth (INDEPENDENT_AMBULATORY_CARE_PROVIDER_SITE_OTHER): Payer: Self-pay | Admitting: *Deleted

## 2008-08-10 ENCOUNTER — Ambulatory Visit (HOSPITAL_COMMUNITY): Admission: RE | Admit: 2008-08-10 | Discharge: 2008-08-10 | Payer: Self-pay | Admitting: Family Medicine

## 2008-08-18 ENCOUNTER — Ambulatory Visit: Payer: Self-pay | Admitting: Infectious Diseases

## 2008-09-20 ENCOUNTER — Telehealth (INDEPENDENT_AMBULATORY_CARE_PROVIDER_SITE_OTHER): Payer: Self-pay | Admitting: *Deleted

## 2008-10-11 ENCOUNTER — Telehealth (INDEPENDENT_AMBULATORY_CARE_PROVIDER_SITE_OTHER): Payer: Self-pay | Admitting: *Deleted

## 2008-11-15 ENCOUNTER — Ambulatory Visit: Payer: Self-pay | Admitting: Infectious Diseases

## 2008-11-15 LAB — CONVERTED CEMR LAB
Alkaline Phosphatase: 87 units/L (ref 39–117)
BUN: 10 mg/dL (ref 6–23)
Basophils Relative: 0 % (ref 0–1)
Eosinophils Absolute: 0.2 10*3/uL (ref 0.0–0.7)
Glucose, Bld: 74 mg/dL (ref 70–99)
HIV 1 RNA Quant: <48 copies/mL (ref <48)
Hemoglobin: 12.5 g/dL (ref 12.0–15.0)
MCHC: 32 g/dL (ref 30.0–36.0)
MCV: 95.8 fL (ref >=78.0)
Monocytes Absolute: 0.6 10*3/uL (ref 0.1–1.0)
Monocytes Relative: 9 % (ref 3–12)
RBC: 4.08 M/uL (ref 3.87–5.11)
Sodium: 138 meq/L (ref 135–145)
Total Bilirubin: 0.4 mg/dL (ref 0.3–1.2)
Total CHOL/HDL Ratio: 2.7
VLDL: 14 mg/dL (ref 0–40)

## 2008-11-17 ENCOUNTER — Telehealth (INDEPENDENT_AMBULATORY_CARE_PROVIDER_SITE_OTHER): Payer: Self-pay | Admitting: *Deleted

## 2008-12-01 ENCOUNTER — Encounter (INDEPENDENT_AMBULATORY_CARE_PROVIDER_SITE_OTHER): Payer: Self-pay | Admitting: *Deleted

## 2008-12-01 ENCOUNTER — Ambulatory Visit: Payer: Self-pay | Admitting: Infectious Diseases

## 2008-12-16 ENCOUNTER — Telehealth (INDEPENDENT_AMBULATORY_CARE_PROVIDER_SITE_OTHER): Payer: Self-pay | Admitting: *Deleted

## 2009-01-13 ENCOUNTER — Ambulatory Visit: Payer: Self-pay | Admitting: Obstetrics and Gynecology

## 2009-01-13 ENCOUNTER — Encounter: Payer: Self-pay | Admitting: Obstetrics & Gynecology

## 2009-01-17 ENCOUNTER — Telehealth (INDEPENDENT_AMBULATORY_CARE_PROVIDER_SITE_OTHER): Payer: Self-pay | Admitting: *Deleted

## 2009-02-18 ENCOUNTER — Telehealth (INDEPENDENT_AMBULATORY_CARE_PROVIDER_SITE_OTHER): Payer: Self-pay | Admitting: *Deleted

## 2009-03-15 ENCOUNTER — Ambulatory Visit: Payer: Self-pay | Admitting: Infectious Diseases

## 2009-03-15 LAB — CONVERTED CEMR LAB
Alkaline Phosphatase: 82 units/L (ref 39–117)
BUN: 11 mg/dL (ref 6–23)
Basophils Absolute: 0 10*3/uL (ref 0.0–0.1)
Basophils Relative: 0 % (ref 0–1)
Creatinine, Ser: 0.87 mg/dL (ref 0.40–1.20)
Eosinophils Absolute: 0.1 10*3/uL (ref 0.0–0.7)
Glucose, Bld: 74 mg/dL (ref 70–99)
HDL: 61 mg/dL (ref >39)
Hemoglobin: 11.8 g/dL — ABNORMAL LOW (ref 12.0–15.0)
LDL Cholesterol: 61 mg/dL (ref 0–99)
MCHC: 31.5 g/dL (ref 30.0–36.0)
Monocytes Absolute: 0.5 10*3/uL (ref 0.1–1.0)
Neutro Abs: 2.6 10*3/uL (ref 1.7–7.7)
RDW: 13.4 % (ref 11.5–15.5)
Sodium: 138 meq/L (ref 135–145)
Total Bilirubin: 0.3 mg/dL (ref 0.3–1.2)
Total CHOL/HDL Ratio: 2.2
Total Protein: 7.2 g/dL (ref 6.0–8.3)
Triglycerides: 74 mg/dL (ref <150)
VLDL: 15 mg/dL (ref 0–40)

## 2009-03-16 ENCOUNTER — Encounter (INDEPENDENT_AMBULATORY_CARE_PROVIDER_SITE_OTHER): Payer: Self-pay | Admitting: *Deleted

## 2009-03-25 ENCOUNTER — Telehealth (INDEPENDENT_AMBULATORY_CARE_PROVIDER_SITE_OTHER): Payer: Self-pay | Admitting: *Deleted

## 2009-04-06 ENCOUNTER — Ambulatory Visit: Payer: Self-pay | Admitting: Infectious Diseases

## 2009-04-28 ENCOUNTER — Telehealth (INDEPENDENT_AMBULATORY_CARE_PROVIDER_SITE_OTHER): Payer: Self-pay | Admitting: *Deleted

## 2009-05-24 ENCOUNTER — Encounter (INDEPENDENT_AMBULATORY_CARE_PROVIDER_SITE_OTHER): Payer: Self-pay | Admitting: *Deleted

## 2009-05-30 ENCOUNTER — Telehealth (INDEPENDENT_AMBULATORY_CARE_PROVIDER_SITE_OTHER): Payer: Self-pay | Admitting: *Deleted

## 2009-06-22 ENCOUNTER — Telehealth (INDEPENDENT_AMBULATORY_CARE_PROVIDER_SITE_OTHER): Payer: Self-pay | Admitting: *Deleted

## 2009-08-24 ENCOUNTER — Ambulatory Visit: Payer: Self-pay | Admitting: Infectious Diseases

## 2009-08-24 LAB — CONVERTED CEMR LAB
ALT: 15 units/L (ref 0–35)
Alkaline Phosphatase: 90 units/L (ref 39–117)
Basophils Absolute: 0 10*3/uL (ref 0.0–0.1)
Creatinine, Ser: 0.88 mg/dL (ref 0.40–1.20)
Eosinophils Absolute: 0.2 10*3/uL (ref 0.0–0.7)
Eosinophils Relative: 3 % (ref 0–5)
HCT: 37.9 % (ref 36.0–46.0)
HIV 1 RNA Quant: <48 copies/mL (ref <48)
HIV-1 RNA Quant, Log: <1.68 (ref <1.68)
Hemoglobin: 12.3 g/dL (ref 12.0–15.0)
MCHC: 32.5 g/dL (ref 30.0–36.0)
MCV: 91.8 fL (ref 78.0–100.0)
Monocytes Absolute: 0.4 10*3/uL (ref 0.1–1.0)
Platelets: 192 10*3/uL (ref 150–400)
RDW: 13.6 % (ref 11.5–15.5)
Sodium: 139 meq/L (ref 135–145)
Total Bilirubin: 0.3 mg/dL (ref 0.3–1.2)
Total Protein: 7.3 g/dL (ref 6.0–8.3)

## 2009-09-05 ENCOUNTER — Ambulatory Visit (HOSPITAL_COMMUNITY): Admission: RE | Admit: 2009-09-05 | Discharge: 2009-09-05 | Payer: Self-pay | Admitting: Anesthesiology

## 2009-09-07 ENCOUNTER — Ambulatory Visit: Payer: Self-pay | Admitting: Infectious Diseases

## 2009-09-13 ENCOUNTER — Encounter: Payer: Self-pay | Admitting: Infectious Diseases

## 2009-09-16 ENCOUNTER — Telehealth (INDEPENDENT_AMBULATORY_CARE_PROVIDER_SITE_OTHER): Payer: Self-pay | Admitting: *Deleted

## 2009-11-09 ENCOUNTER — Encounter: Payer: Self-pay | Admitting: Infectious Diseases

## 2009-11-14 ENCOUNTER — Ambulatory Visit: Payer: Self-pay | Admitting: Infectious Diseases

## 2009-11-14 LAB — CONVERTED CEMR LAB
BUN: 9 mg/dL (ref 6–23)
Basophils Absolute: 0 10*3/uL (ref 0.0–0.1)
CO2: 26 meq/L (ref 19–32)
Calcium: 9 mg/dL (ref 8.4–10.5)
Chloride: 109 meq/L (ref 96–112)
Creatinine, Ser: 0.82 mg/dL (ref 0.40–1.20)
Eosinophils Relative: 3 % (ref 0–5)
Glucose, Bld: 62 mg/dL — ABNORMAL LOW (ref 70–99)
HCT: 35.8 % — ABNORMAL LOW (ref 36.0–46.0)
Hemoglobin: 11.3 g/dL — ABNORMAL LOW (ref 12.0–15.0)
Lymphocytes Relative: 37 % (ref 12–46)
Monocytes Absolute: 0.5 10*3/uL (ref 0.1–1.0)
Monocytes Relative: 9 % (ref 3–12)
Neutro Abs: 2.9 10*3/uL (ref 1.7–7.7)
RBC: 3.9 M/uL (ref 3.87–5.11)
RDW: 13.1 % (ref 11.5–15.5)
Total Bilirubin: 0.3 mg/dL (ref 0.3–1.2)

## 2009-11-30 ENCOUNTER — Ambulatory Visit: Payer: Self-pay | Admitting: Infectious Diseases

## 2009-12-08 ENCOUNTER — Encounter: Payer: Self-pay | Admitting: Infectious Diseases

## 2010-01-03 ENCOUNTER — Encounter (INDEPENDENT_AMBULATORY_CARE_PROVIDER_SITE_OTHER): Payer: Self-pay | Admitting: *Deleted

## 2010-01-09 ENCOUNTER — Encounter (INDEPENDENT_AMBULATORY_CARE_PROVIDER_SITE_OTHER): Payer: Self-pay | Admitting: *Deleted

## 2010-02-28 ENCOUNTER — Ambulatory Visit
Admission: RE | Admit: 2010-02-28 | Discharge: 2010-02-28 | Payer: Self-pay | Source: Home / Self Care | Attending: Infectious Diseases | Admitting: Infectious Diseases

## 2010-02-28 ENCOUNTER — Encounter: Payer: Self-pay | Admitting: Infectious Diseases

## 2010-02-28 LAB — CONVERTED CEMR LAB
ALT: 19 units/L (ref 0–35)
AST: 26 units/L (ref 0–37)
Albumin: 4.1 g/dL (ref 3.5–5.2)
Alkaline Phosphatase: 93 units/L (ref 39–117)
BUN: 10 mg/dL (ref 6–23)
Basophils Absolute: 0 10*3/uL (ref 0.0–0.1)
Basophils Relative: 0 % (ref 0–1)
Bilirubin Urine: NEGATIVE
Blood, UA: NEGATIVE
CO2: 25 meq/L (ref 19–32)
Calcium: 9.1 mg/dL (ref 8.4–10.5)
Chlamydia, Swab/Urine, PCR: NEGATIVE
Chloride: 105 meq/L (ref 96–112)
Cholesterol: 135 mg/dL (ref 0–200)
Creatinine, Ser: 0.89 mg/dL (ref 0.40–1.20)
Eosinophils Absolute: 0.1 10*3/uL (ref 0.0–0.7)
Eosinophils Relative: 2 % (ref 0–5)
GC Probe Amp, Urine: NEGATIVE
Glucose, Bld: 74 mg/dL (ref 70–99)
HCT: 36.2 % (ref 36.0–46.0)
HCV Quantitative: 406000 intl units/mL — ABNORMAL HIGH (ref <43)
HDL: 56 mg/dL (ref >39)
HIV 1 RNA Quant: <20 copies/mL (ref <20)
HIV-1 RNA Quant, Log: <1.3 (ref <1.30)
Hemoglobin: 11.4 g/dL — ABNORMAL LOW (ref 12.0–15.0)
Ketones, ur: NEGATIVE mg/dL
LDL Cholesterol: 62 mg/dL (ref 0–99)
Leukocytes, UA: NEGATIVE
Lymphocytes Relative: 36 % (ref 12–46)
Lymphs Abs: 2 10*3/uL (ref 0.7–4.0)
MCHC: 31.5 g/dL (ref 30.0–36.0)
MCV: 91.9 fL (ref 78.0–100.0)
Monocytes Absolute: 0.5 10*3/uL (ref 0.1–1.0)
Monocytes Relative: 8 % (ref 3–12)
Neutro Abs: 3 10*3/uL (ref 1.7–7.7)
Neutrophils Relative %: 54 % (ref 43–77)
Nitrite: NEGATIVE
Platelets: 205 10*3/uL (ref 150–400)
Potassium: 4.6 meq/L (ref 3.5–5.3)
Protein, ur: NEGATIVE mg/dL
RBC: 3.94 M/uL (ref 3.87–5.11)
RDW: 14.1 % (ref 11.5–15.5)
Sodium: 139 meq/L (ref 135–145)
Specific Gravity, Urine: 1.023 (ref 1.005–1.030)
Total Bilirubin: 0.3 mg/dL (ref 0.3–1.2)
Total CHOL/HDL Ratio: 2.4
Total Protein: 7.3 g/dL (ref 6.0–8.3)
Triglycerides: 84 mg/dL (ref <150)
Urine Glucose: NEGATIVE mg/dL
Urobilinogen, UA: 0.2 (ref 0.0–1.0)
VLDL: 17 mg/dL (ref 0–40)
WBC: 5.6 10*3/uL (ref 4.0–10.5)
pH: 6.5 (ref 5.0–8.0)

## 2010-03-12 LAB — CONVERTED CEMR LAB
ALT: 25 units/L (ref 0–35)
AST: 25 units/L (ref 0–37)
Albumin: 4.5 g/dL (ref 3.5–5.2)
Alkaline Phosphatase: 120 units/L — ABNORMAL HIGH (ref 39–117)
BUN: 13 mg/dL (ref 6–23)
Basophils Absolute: 0 10*3/uL (ref 0.0–0.1)
Basophils Relative: 0 % (ref 0–1)
Bilirubin Urine: NEGATIVE
CD4 Count: 710 microliters
CD4 Count: 870 microliters
CO2: 24 meq/L (ref 19–32)
Calcium: 9.5 mg/dL (ref 8.4–10.5)
Chloride: 110 meq/L (ref 96–112)
Cholesterol: 125 mg/dL (ref 0–200)
Creatinine, Ser: 0.99 mg/dL (ref 0.40–1.20)
Eosinophils Absolute: 0.1 10*3/uL (ref 0.0–0.7)
Eosinophils Relative: 1 % (ref 0–5)
Glucose, Bld: 78 mg/dL (ref 70–99)
HCT: 40.8 % (ref 36.0–46.0)
HDL: 35 mg/dL — ABNORMAL LOW (ref >39)
HIV 1 RNA Quant: 204 copies/mL — ABNORMAL HIGH (ref <50)
HIV-1 RNA Quant, Log: 2.31 — ABNORMAL HIGH (ref <1.70)
Hemoglobin: 12.9 g/dL (ref 12.0–15.0)
Ketones, ur: NEGATIVE mg/dL
LDL Cholesterol: 72 mg/dL (ref 0–99)
Lymphocytes Relative: 60 % — ABNORMAL HIGH (ref 12–46)
Lymphs Abs: 3.1 10*3/uL (ref 0.7–3.3)
MCHC: 31.6 g/dL (ref 30.0–36.0)
MCV: 96.7 fL (ref 78.0–100.0)
Monocytes Absolute: 0.5 10*3/uL (ref 0.2–0.7)
Monocytes Relative: 10 % (ref 3–11)
Neutro Abs: 1.5 10*3/uL — ABNORMAL LOW (ref 1.7–7.7)
Neutrophils Relative %: 29 % — ABNORMAL LOW (ref 43–77)
Nitrite: POSITIVE — AB
Platelets: 240 10*3/uL (ref 150–400)
Potassium: 4.6 meq/L (ref 3.5–5.3)
Protein, ur: NEGATIVE mg/dL
RBC / HPF: NONE SEEN (ref <3)
RBC: 4.22 M/uL (ref 3.87–5.11)
RDW: 13.6 % (ref 11.5–14.0)
Sodium: 141 meq/L (ref 135–145)
Specific Gravity, Urine: 1.023 (ref 1.005–1.03)
TSH: 1.695 microintl units/mL (ref 0.350–5.50)
Total Bilirubin: 0.5 mg/dL (ref 0.3–1.2)
Total CHOL/HDL Ratio: 3.6
Total Protein: 8.2 g/dL (ref 6.0–8.3)
Triglycerides: 91 mg/dL (ref <150)
Urine Glucose: NEGATIVE mg/dL
Urobilinogen, UA: 0.2 (ref 0.0–1.0)
VLDL: 18 mg/dL (ref 0–40)
WBC: 5.2 10*3/uL (ref 4.0–10.5)
pH: 6 (ref 5.0–8.0)

## 2010-03-14 ENCOUNTER — Ambulatory Visit
Admission: RE | Admit: 2010-03-14 | Discharge: 2010-03-14 | Payer: Self-pay | Source: Home / Self Care | Attending: Infectious Diseases | Admitting: Infectious Diseases

## 2010-03-16 NOTE — Progress Notes (Signed)
Summary: [facility]/pt assist med arrived for Apr  Phone Note Refill Request      Prescriptions: ATRIPLA 600-200-300 MG TABS (EFAVIRENZ-EMTRICITAB-TENOFOVIR) Take 1 tablet by mouth once a day  #30 x 0   Entered by:   Paulo Fruit  BS,CPht II,MPH   Authorized by:   Johny Sax MD   Signed by:   Paulo Fruit  BS,CPht II,MPH on 05/30/2009   Method used:   Samples Given   RxID:   [id number]   Patient Assist Medication Verification: Medication:Atripla GMW#[id number] Exp Date:06 2013 Tech approval:MLD               Patient just called to state she is on her way to pickup medication Paulo Fruit  BS,CPht II,MPH  May 30, 2009 9:24 AM

## 2010-03-16 NOTE — Progress Notes (Signed)
Summary: Pt assist med arrived 3 mon supply nxt reorder 11/03/09  Phone Note Refill Request      Prescriptions: ZOLOFT 50 MG TABS (SERTRALINE HCL) 1/2 tab by mouth once daily for 1 week then 1 tab by mouth once daily.  #90 x 0   Entered by:   Paulo Fruit  BS,CPht II,MPH   Authorized by:   Johny Sax MD   Signed by:   Paulo Fruit  BS,CPht II,MPH on 09/16/2009   Method used:   Samples Given   RxID:   [id number]   Patient Assist Medication Verification: Medication: Zoloft 50mg  XFG#[id number] Exp Date:09 2013 Tech approval:MLD Call placed to patient with message that assistance medications are ready for pick-up. patient is coming to pick up today. sign

## 2010-03-16 NOTE — Assessment & Plan Note (Signed)
Summary: 21month f/u [mkj]   CC:  5 month follow up and Depression.  History of Present Illness: 50 yo F with a Hx of HIV+, DUB. Has been on atripla (for convenience) since 2007 after having been successfully maintained on NFV for many years. CD4 720 and VL <48  (08-24-09). Has not had a period in 3 months and has been feeling a little down- belies this is related to her being out of work. Also, her son is ADHD and she is having difficulty managing him. States she had mammo on 09-05-09. Has not had eval by GYN this year.  No problems with her ART.   Depression History:      The patient is having a depressed mood most of the day and has a diminished interest in her usual daily activities.        The patient denies that she feels like life is not worth living, denies that she wishes that she were dead, and denies that she has thought about ending her life.         Preventive Screening-Counseling & Management  Alcohol-Tobacco     Alcohol drinks/day: 0     Smoking Status: current     Smoking Cessation Counseling: yes     Smoke Cessation Stage: contemplative     Packs/Day: 0.5   Updated Prior Medication List: ATRIPLA 600-200-300 MG TABS (EFAVIRENZ-EMTRICITAB-TENOFOVIR) Take 1 tablet by mouth once a day  Current Allergies (reviewed today): ! ASA Past History:  Past medical, surgical, family and social histories (including risk factors) reviewed, and no changes noted (except as noted below).  Past Medical History: Reviewed history from 04/06/2009 and no changes required. GERD Hepatitis C HIV disease Pneumonia, hx of PC Macrocytic anemia substance abuse tobacco abuse Dysfunctional Uterine Bleeding  Past Surgical History: Reviewed history from 12/14/2005 and no changes required. colposcopy - 8/03  Family History: Reviewed history from 07/23/2007 and no changes required. mother deceased with CA of spine sister died with lung CA.   Social History: Reviewed history from  04/06/2009 and no changes required. Current Smoker. can't afford patch.  Alcohol use-no. 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   Review of Systems       wt down 6#. has occas bloating, cramping but no menses.   Vital Signs:  Patient profile:   50 year old female Height:      72 inches (182.88 cm) Weight:      134.0 pounds (60.91 kg) BMI:     18.24 Temp:     97.9 degrees F (36.61 degrees C) oral Pulse rate:   76 / minute BP sitting:   109 / 77  (left arm)  Vitals Entered By: Baxter Hire) (September 07, 2009 10:48 AM) CC: 5 month follow up, Depression Pain Assessment Patient in pain? yes     Location: head Intensity: 6 Type: aching Onset of pain  pain just about every day Nutritional Status BMI of < 19 = underweight Nutritional Status Detail appetite is not good per patient   Physical Exam  General:  well-developed, well-nourished, well-hydrated, and overweight-appearing.   Eyes:  pupils equal, pupils round, and pupils reactive to light.   Mouth:  pharynx pink and moist and no exudates.   Neck:  has palpable carotid bulb on L. o/w no masses.  Lungs:  normal respiratory effort and normal breath sounds.   Heart:  normal rate, regular rhythm, and no murmur.   Abdomen:  soft, non-tender, and normal  bowel sounds.     Impression & Recommendations:  Problem # 1:  HIV DISEASE (ICD-042) she is doing very well. somewhat depressed about her social isolation due to her son and her status. not sexually active. taking meds well. i offered to start her on megace but she defers. return to clinic 2-3 months.   Problem # 2:  DEPRESSION (ICD-311)  would like to be started on medication for this.   Her updated medication list for this problem includes:    Zoloft 50 Mg Tabs (Sertraline hcl) .Marland Kitchen... 1/2 tab by mouth once daily for 1 week then 1 tab by mouth once daily.  Medications Added to Medication List This Visit: 1)  Zoloft 50 Mg Tabs (Sertraline hcl) .... 1/2 tab  by mouth once daily for 1 week then 1 tab by mouth once daily. Prescriptions: ZOLOFT 50 MG TABS (SERTRALINE HCL) 1/2 tab by mouth once daily for 1 week then 1 tab by mouth once daily.  #40 x 3   Entered and Authorized by:   Johny Sax MD   Signed by:   Johny Sax MD on 09/07/2009   Method used:   Print then Give to Patient   RxID:   [id number]   Prevention & Chronic Care Immunizations   Influenza vaccine: Fluvax 3+  (12/01/2008)    Tetanus booster: Not documented    Pneumococcal vaccine: Not documented  Other Screening   Pap smear: NEGATIVE FOR INTRAEPITHELIAL LESIONS OR MALIGNANCY.  (08/05/2008)    Mammogram: normal  (05/19/2007)   Smoking status: current  (09/07/2009)   Smoking cessation counseling: yes  (09/07/2009)  Lipids   Total Cholesterol: 137  (03/15/2009)   LDL: 61  (03/15/2009)   LDL Direct: Not documented   HDL: 61  (03/15/2009)   Triglycerides: 74  (03/15/2009)   Appended Document: Orders Update    Clinical Lists Changes  Orders: Added new Service order of Est. Patient Level IV (14782) - Signed

## 2010-03-16 NOTE — Miscellaneous (Signed)
Summary: RW FINANCIAL UPDATE   Clinical Lists Changes  Observations: Added new observation of PCTFPL: 73.62  (01/09/2010 9:51) Added new observation of HOUSEINCOME: 08657  (01/09/2010 9:51) Added new observation of FINASSESSDT: 12/08/2009  (01/09/2010 9:51)

## 2010-03-16 NOTE — Letter (Signed)
Summary: [facility]  [facility]   Imported By: Florinda Marker 10/05/2009 14:43:55  _____________________________________________________________________  External Attachment:    Type:   Image     Comment:   External Document

## 2010-03-16 NOTE — Assessment & Plan Note (Signed)
Summary: 88month f/u/vs   CC:  4 month follow up.  History of Present Illness: 50 yo F with a Hx of HIV+, DUB. Has been on atripla (for convenience) since 2007 after having been successfully maintained on NFV for many years. CD4 690 and VL 180  (03-16-09). nl mamo 08-10-08. nl pap 6-10 also. taking ART well.  c/o feeling gassy and having heart burn. Has been off pepcid. has q's about stopping her estrace (taking for menopause related symptoms). she is not sure it is helping her. has had 2 periods since she has been on med.   Preventive Screening-Counseling & Management  Alcohol-Tobacco     Alcohol drinks/day: 0     Smoking Status: current     Smoking Cessation Counseling: yes     Packs/Day: 0.5     Year Started: 30 years ago     Passive Smoke Exposure: yes  Caffeine-Diet-Exercise     Caffeine use/day: coffee,tea and sodas     Does Patient Exercise: yes     Type of exercise: active at work     Times/week: 5  Safety-Violence-Falls     Seat Belt Use: yes   Updated Prior Medication List: ATRIPLA 600-200-300 MG TABS (EFAVIRENZ-EMTRICITAB-TENOFOVIR) Take 1 tablet by mouth once a day MEGACE ORAL 40 MG/ML  SUSP (MEGESTROL ACETATE) 10 ml by mouth once daily ESTRACE 1 MG TABS (ESTRADIOL) Take one tablet by mouth every other day  Current Allergies (reviewed today): ! ASA Past History:  Past medical, surgical, family and social histories (including risk factors) reviewed, and no changes noted (except as noted below).  Past Medical History: GERD Hepatitis C HIV disease Pneumonia, hx of PC Macrocytic anemia substance abuse tobacco abuse Dysfunctional Uterine Bleeding  Past Surgical History: Reviewed history from 12/14/2005 and no changes required. colposcopy - 8/03  Family History: Reviewed history from 07/23/2007 and no changes required. mother deceased with CA of spine sister died with lung CA.   Social History: Current Smoker. can't afford patch.  Alcohol  use-no  Review of Systems       wt steady. has a date sat pm.   Vital Signs:  Patient profile:   50 year old female Height:      72 inches (182.88 cm) Weight:      140.3 pounds (63.77 kg) BMI:     19.10 Temp:     97.3 degrees F (36.28 degrees C) oral Pulse rate:   67 / minute BP sitting:   128 / 80  (left arm)  Vitals Entered By: Baxter Hire) (April 06, 2009 10:41 AM) CC: 4 month follow up Pain Assessment Patient in pain? no      Nutritional Status BMI of 19 -24 = normal Nutritional Status Detail appetite is okay per patient  Have you ever been in a relationship where you felt threatened, hurt or afraid?No   Does patient need assistance? Functional Status Self care Ambulation Normal   Physical Exam  General:  well-developed, well-nourished, and well-hydrated.   Eyes:  pupils equal, pupils round, and pupils reactive to light.   Mouth:  pharynx pink and moist and no exudates.   Neck:  no masses.   Lungs:  normal respiratory effort and normal breath sounds.   Heart:  normal rate, regular rhythm, and no murmur.   Abdomen:  soft, non-tender, and normal bowel sounds.          Medication Adherence: 04/06/2009   Adherence to medications reviewed with patient. Counseling to provide adequate adherence  provided   Prevention For Positives: 04/06/2009   Safe sex practices discussed with patient. Condoms offered.                             Impression & Recommendations:  Problem # 1:  HIV DISEASE (ICD-042)  she is doing very well. she is given condoms. will return to clinic 4-5 months. will set up her PAP at that point.   Orders: Mammogram (Screening) (Mammo)  Problem # 2:  GERD (ICD-530.81) will restart her pepcid, use as needed  Her updated medication list for this problem includes:    Pepcid 40 Mg Tabs (Famotidine) .Marland Kitchen... Take 1 tablet by mouth once a day  Problem # 3:  TOBACCO ABUSE (ICD-305.1) encouraged to stop smoking. relayed that this is  the biggest factor to shorten her life expectancya t this point.  The following medications were removed from the medication list:    Nicotine 14 Mg/24hr Pt24 (Nicotine) .Marland Kitchen... 1 patch onto armdaily for 6 weeks, then change to 7mg /qd patch or 2 weeks.  Problem # 4:  DYSFUNCTIONAL UTERINE BLEEDING (ICD-626.8) encouraged her to f/u with GYN. she will cont estrace, may need higher dose?  Problem # 5:  LUMP OR MASS IN BREAST (ICD-611.72)  will send her for f/u mammo.   Orders: Mammogram (Screening) (Mammo)  Medications Added to Medication List This Visit: 1)  Estrace 1 Mg Tabs (Estradiol) .... Take one tablet by mouth every other day  Other Orders: Est. Patient Level IV (04540) Future Orders: T-CD4SP ([facility]) (CD4SP) ... 07/05/2009 T-HIV Viral Load [phone]) ... 07/05/2009 T-Comprehensive Metabolic Panel [phone]) ... 07/05/2009 T-CBC w/Diff ([id number]) ... 07/05/2009  Prescriptions: ESTRACE 1 MG TABS (ESTRADIOL) Take one tablet by mouth every other day  #90 x 3   Entered and Authorized by:   Johny Sax MD   Signed by:   Johny Sax MD on 04/06/2009   Method used:   Historical   RxID:   [id number]  Process Orders Check Orders Results:     [facility]: ABN not required for this insurance Tests Sent for requisitioning (April 06, 2009 11:23 AM):     07/05/2009: [facility] -- T-HIV Viral Load [phone] (signed)     07/05/2009: [facility] -- T-Comprehensive Metabolic Panel [[id number]] (signed)     07/05/2009: [facility] -- [facility] w/Diff [[id number]] (signed)

## 2010-03-16 NOTE — Progress Notes (Signed)
Summary: [facility]/pt assist med arrived for Feb  Phone Note Refill Request      Prescriptions: ATRIPLA 600-200-300 MG TABS (EFAVIRENZ-EMTRICITAB-TENOFOVIR) Take 1 tablet by mouth once a day  #30 x 0   Entered by:   Paulo Fruit  BS,CPht II,MPH   Authorized by:   Cliffton Asters MD   Signed by:   Paulo Fruit  BS,CPht II,MPH on 03/25/2009   Method used:   Samples Given   RxID:   [id number]   Patient Assist Medication Verification: Medication: Atripla Lot# [id number] Exp Date:07 2013 Tech approval:MLD Call placed to patient with message that assistance medications are ready for pick-up. Left message Paulo Fruit  BS,CPht II,MPH  March 25, 2009 2:35 PM

## 2010-03-16 NOTE — Miscellaneous (Signed)
  Clinical Lists Changes  Observations: Added new observation of YEARAIDSPOS: 2002  (01/03/2010 14:37)

## 2010-03-16 NOTE — Progress Notes (Signed)
Summary: [facility]/pt assist med arrived for jan  Phone Note Refill Request      Prescriptions: ATRIPLA 600-200-300 MG TABS (EFAVIRENZ-EMTRICITAB-TENOFOVIR) Take 1 tablet by mouth once a day  #30 x 0   Entered by:   Paulo Fruit  BS,CPht II,MPH   Authorized by:   Johny Sax MD   Signed by:   Paulo Fruit  BS,CPht II,MPH on 02/18/2009   Method used:   Samples Given   RxID:   [id number]   Patient Assist Medication Verification: Medication: Atripla Lot# [id number] Exp Date:06 2013 Tech approval:MLD Call placed to patient with message that assistance medications are ready for pick-up. Left message Paulo Fruit  BS,CPht II,MPH  February 18, 2009 2:38 PM                  Appended Document: [facility]/pt assist med arrived for jan Prescription/Samples picked up by: patient

## 2010-03-16 NOTE — Progress Notes (Signed)
Summary: [facility]/pt assist med arrived for Mar  Phone Note Refill Request      Prescriptions: ATRIPLA 600-200-300 MG TABS (EFAVIRENZ-EMTRICITAB-TENOFOVIR) Take 1 tablet by mouth once a day  #30 x 0   Entered by:   Paulo Fruit  BS,CPht II,MPH   Authorized by:   Johny Sax MD   Signed by:   Paulo Fruit  BS,CPht II,MPH on 04/28/2009   Method used:   Samples Given   RxID:   [id number]   Patient Assist Medication Verification: Medication:Atripla Lot# [id number] Exp Date:02 2013 Tech approval:MLD Call placed to patient with message that assistance medications are ready for pick-up. Left message on VM Paulo Fruit  BS,CPht II,MPH  April 28, 2009 2:56 PM

## 2010-03-16 NOTE — Miscellaneous (Signed)
Summary: Orders Update - labs  Clinical Lists Changes  Orders: Added new Test order of T-CBC w/Diff [phone]) - Signed Added new Test order of T-CD4SP [facility]) (CD4SP) - Signed Added new Test order of T-Comprehensive Metabolic Panel [phone]) - Signed Added new Test order of T-HIV Viral Load [phone]) - Signed     Process Orders Check Orders Results:     [facility]: ABN not required for this insurance Order queued for requisitioning for [facility]: November 09, 2009 11:18 AM  Tests Sent for requisitioning (November 09, 2009 11:18 AM):     11/14/2009: [facility] -- T-CBC w/Diff [[id number]] (signed)     11/14/2009: [facility] -- T-Comprehensive Metabolic Panel [[id number]] (signed)     11/14/2009: [facility] -- T-HIV Viral Load [phone] (signed)

## 2010-03-16 NOTE — Letter (Signed)
Summary: AIDS Drug Assistance Program: Financial Eligibility  AIDS Drug Assistance Program: Financial Eligibility   Imported By: Florinda Marker 12/28/2009 16:03:10  _____________________________________________________________________  External Attachment:    Type:   Image     Comment:   External Document

## 2010-03-16 NOTE — Miscellaneous (Signed)
Summary: clinical update/ryan white ncadap apprv til 05/13/10  Clinical Lists Changes  Observations: Added new observation of AIDSDAP: Yes 2011 (05/24/2009 10:52)

## 2010-03-16 NOTE — Miscellaneous (Signed)
Summary: RW Update  Clinical Lists Changes  Observations: Added new observation of HIV STATUS: CDC-defined AIDS (03/16/2009 16:48)

## 2010-03-16 NOTE — Progress Notes (Signed)
Summary: [facility]/pt assist med arrived for May  Phone Note Refill Request      Prescriptions: ATRIPLA 600-200-300 MG TABS (EFAVIRENZ-EMTRICITAB-TENOFOVIR) Take 1 tablet by mouth once a day  #30 x 0   Entered by:   Paulo Fruit  BS,CPht II,MPH   Authorized by:   Johny Sax MD   Signed by:   Paulo Fruit  BS,CPht II,MPH on 06/22/2009   Method used:   Samples Given   RxID:   [id number]   Patient Assist Medication Verification: Medication:Atripla PPI#[id number] Exp Date:10 2013 Tech approval:MLD Call placed to patient with message that assistance medications are ready for pick-up. Left patient a message to contact [facility] @ [phone] Paulo Fruit  BS,CPht II,MPH  Jun 22, 2009 4:43 PM

## 2010-03-16 NOTE — Assessment & Plan Note (Signed)
Summary: F/U [MKJ]   CC:  3 month follow up.  History of Present Illness: 50 yo F with a Hx of HIV+, DUB. Has been on atripla (for convenience) since 2007 after having been successfully maintained on NFV for many years. No problems with ART.  CD4 600 and VL <20  (09-14-09).  Still feeling depressed. son is still giving her difficulties, defiant. Took zoloft for 1 month- made her angry, quit taking. Had mammo february, told that it was negative.   Preventive Screening-Counseling & Management  Alcohol-Tobacco     Alcohol drinks/day: 0     Smoking Status: current     Smoking Cessation Counseling: yes     Smoke Cessation Stage: contemplative     Packs/Day: 0.5     Year Started: 30 years ago     Passive Smoke Exposure: yes  Caffeine-Diet-Exercise     Caffeine use/day: coffee,tea and sodas     Does Patient Exercise: yes     Type of exercise: active at work     Times/week: 5  Safety-Violence-Falls     Seat Belt Use: yes   Updated Prior Medication List: ATRIPLA 600-200-300 MG TABS (EFAVIRENZ-EMTRICITAB-TENOFOVIR) Take 1 tablet by mouth once a day  Current Allergies (reviewed today): ! ASA Past History:  Past medical, surgical, family and social histories (including risk factors) reviewed, and no changes noted (except as noted below).  Past Medical History: Reviewed history from 04/06/2009 and no changes required. GERD Hepatitis C HIV disease Pneumonia, hx of PC Macrocytic anemia substance abuse tobacco abuse Dysfunctional Uterine Bleeding  Past Surgical History: Reviewed history from 12/14/2005 and no changes required. colposcopy - 8/03  Family History: Reviewed history from 07/23/2007 and no changes required. mother deceased with CA of spine sister died with lung CA.   Social History: Reviewed history from 09/07/2009 and no changes required. Current Smoker. can't afford patch.  Alcohol use-no. 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   Review  of Systems       eating well, passing bm 1-2 x / wk. increases around menses. periods ae NL again.   Vital Signs:  Patient profile:   50 year old female Height:      72 inches (182.88 cm) Weight:      135.12 pounds (61.42 kg) BMI:     18.39 Temp:     98.1 degrees F (36.72 degrees C) oral Pulse rate:   74 / minute BP sitting:   111 / 79  (right arm)  Vitals Entered By: Baxter Hire) (November 30, 2009 9:01 AM) CC: 3 month follow up Pain Assessment Patient in pain? no      Nutritional Status BMI of < 19 = underweight Nutritional Status Detail appetite is still weak per patient  Have you ever been in a relationship where you felt threatened, hurt or afraid?No   Does patient need assistance? Functional Status Self care Ambulation Normal   Physical Exam  General:  well-developed, well-nourished, and well-hydrated.   Eyes:  pupils equal, pupils round, and pupils reactive to light.   Mouth:  pharynx pink and moist and no exudates.   Neck:  no masses.   Lungs:  normal respiratory effort and normal breath sounds.   Heart:  normal rate, regular rhythm, and no murmur.   Abdomen:  soft, non-tender, and normal bowel sounds.   Psych:  Oriented X3, normally interactive, good eye contact, not anxious appearing, and not depressed appearing.  Medication Adherence: 11/30/2009   Adherence to medications reviewed with patient. Counseling to provide adequate adherence provided   Prevention For Positives: 11/30/2009   Safe sex practices discussed with patient. Condoms offered.                             Impression & Recommendations:  Problem # 1:  HIV DISEASE (ICD-042) doing very well. offered condoms. no change in meds, flu shot today, return to clinic 3-4 months for review of depression rx.   Problem # 2:  DEPRESSION (ICD-311) will give trial of paxil.  The following medications were removed from the medication list:    Zoloft 50 Mg Tabs (Sertraline hcl)  .Marland Kitchen... 1/2 tab by mouth once daily for 1 week then 1 tab by mouth once daily. Her updated medication list for this problem includes:    Paxil 20 Mg Tabs (Paroxetine hcl) .Marland Kitchen... Take 1 tablet by mouth once a day  Problem # 3:  LUMP OR MASS IN BREAST (ICD-611.72) resolved.   Medications Added to Medication List This Visit: 1)  Paxil 20 Mg Tabs (Paroxetine hcl) .... Take 1 tablet by mouth once a day  Other Orders: Est. Patient Level IV (04540)  Prescriptions: PAXIL 20 MG TABS (PAROXETINE HCL) Take 1 tablet by mouth once a day  #30 x 3   Entered and Authorized by:   Johny Sax MD   Signed by:   Johny Sax MD on 11/30/2009   Method used:   Print then Give to Patient   RxID:   [id number]   Appended Document: Orders Update-flu shot    Clinical Lists Changes  Orders: Added new Service order of Influenza Vaccine NON MCR (08657) - Signed Observations: Added new observation of FLU VAX#1VIS: 09/06/09 version given November 30, 2009. (11/30/2009 9:44) Added new observation of FLU VAXLOT: 11033P (11/30/2009 9:44) Added new observation of FLU VAX EXP: 05/14/2010 (11/30/2009 9:44) Added new observation of FLU VAXBY: Kathi Simpers [facility]) (11/30/2009 9:44) Added new observation of FLU VAXRTE: IM (11/30/2009 9:44) Added new observation of FLU VAX DSE: 0.5 ml (11/30/2009 9:44) Added new observation of FLU VAXMFR: Novartis (11/30/2009 9:44) Added new observation of FLU VAX SITE: right deltoid (11/30/2009 9:44) Added new observation of FLU VAX: Fluvax Non-MCR (11/30/2009 9:44)       Influenza Vaccine    Vaccine Type: Fluvax Non-MCR    Site: right deltoid    Mfr: Novartis    Dose: 0.5 ml    Route: IM    Given by: Kathi Simpers CMA(AAMA)    Exp. Date: 05/14/2010    Lot #: [id number]    VIS given: 09/06/09 version given November 30, 2009.  Flu Vaccine Consent Questions    Do you have a history of severe allergic reactions to this vaccine? no    Any prior history of allergic  reactions to egg and/or gelatin? no    Do you have a sensitivity to the preservative Thimersol? no    Do you have a past history of Guillan-Barre Syndrome? no    Do you currently have an acute febrile illness? no    Have you ever had a severe reaction to latex? no    Vaccine information given and explained to patient? yes    Are you currently pregnant? no

## 2010-03-20 ENCOUNTER — Inpatient Hospital Stay (INDEPENDENT_AMBULATORY_CARE_PROVIDER_SITE_OTHER)
Admission: RE | Admit: 2010-03-20 | Discharge: 2010-03-20 | Disposition: A | Discharge status: Home / Self Care | Payer: Self-pay | Source: Ambulatory Visit | Attending: Family Medicine | Admitting: Family Medicine

## 2010-03-20 DIAGNOSIS — N390 Urinary tract infection, site not specified: Secondary | ICD-10-CM

## 2010-03-20 LAB — POCT URINALYSIS DIPSTICK
Specific Gravity, Urine: 1.02 (ref 1.005–1.030)
Urine Glucose, Fasting: NEGATIVE mg/dL

## 2010-03-22 NOTE — Assessment & Plan Note (Signed)
Summary: F/U/VS   CC:  follow-up visit & not taking antidepressants. has not picked it up yet and pt. due for PAP.  History of Present Illness: 50 yo F with a Hx of HIV+, DUB. Has been on atripla (for convenience) since 2007 after having been successfully maintained on NFV for many years. No problems with ART.  CD4 710 and VL <20, Hep C VL 406,000  (02-28-10). Concerned that she is underweight. Eating well, in spurts. very active at work. No probs with ART. Has been off Paxil, been unable to afford the rx.   Preventive Screening-Counseling & Management  Alcohol-Tobacco     Alcohol drinks/day: 0     Smoking Status: current     Smoking Cessation Counseling: yes     Smoke Cessation Stage: contemplative     Packs/Day: 0.5     Year Started: 30 years ago     Passive Smoke Exposure: yes  Caffeine-Diet-Exercise     Caffeine use/day: coffee,tea and sodas     Does Patient Exercise: yes     Type of exercise: active at work     Times/week: 5  Hep-HIV-STD-Contraception     HIV Risk Counseling: 04/03/2006  Safety-Violence-Falls     Seat Belt Use: yes      Drug Use:  no.    Comments: pt. given condoms   Updated Prior Medication List: ATRIPLA 600-200-300 MG TABS (EFAVIRENZ-EMTRICITAB-TENOFOVIR) Take 1 tablet by mouth once a day PAXIL 20 MG TABS (PAROXETINE HCL) Take 1 tablet by mouth once a day  Current Allergies (reviewed today): ! ASA Review of Systems       no menstraul cycle since last visit. has signs that she is going to have one - headache, back ache, cramping.   Vital Signs:  Patient profile:   50 year old female LMP:     12/13/2009 Height:      72 inches Weight:      137.4 pounds BMI:     18.70 Temp:     98 degrees F oral Pulse rate:   87 / minute BP sitting:   134 / 86  Vitals Entered By: Golden Circle RN (March 14, 2010 2:25 PM) CC: follow-up visit & not taking antidepressants. has not picked it up yet, pt. due for PAP Is Patient Diabetic? No Pain  Assessment Patient in pain? no      Nutritional Status BMI of < 19 = underweight Nutritional Status Detail appetite is "not any better"  Have you ever been in a relationship where you felt threatened, hurt or afraid?No   Does patient need assistance? Functional Status Self care Ambulation Normal Comments has not missed any atripla doses LMP (date): 12/13/2009 LMP - Character: normal     Enter LMP: 12/13/2009 Last PAP Result NEGATIVE FOR INTRAEPITHELIAL LESIONS OR MALIGNANCY.   Physical Exam  General:  well-developed, well-nourished, and well-hydrated.   Eyes:  pupils equal, pupils round, and pupils reactive to light.   Mouth:  pharynx pink and moist and no exudates.   Neck:  no masses.   Lungs:  normal respiratory effort and normal breath sounds.   Heart:  normal rate, regular rhythm, and no murmur.   Abdomen:  soft, non-tender, and normal bowel sounds.     Impression & Recommendations:  Problem # 1:  HIV DISEASE (ICD-042)  have her set up for mammo. she is doing well. offered condoms. cont her current rxs. will see her back in 4-5 months.   Orders: Mammogram (Mammogram)  Problem # 2:  COLPOSCOPY, HX OF (ICD-V15.89) will have her [facility] /high risk clinic.  Problem # 3:  HEPATITIS C (ICD-070.51) will cont to follow Hep C VL and lfts.   Other Orders: Est. Patient Level IV (16109) Future Orders: T-CD4SP ([facility]) (CD4SP) ... 06/12/2010 T-HIV Viral Load [phone]) ... 06/12/2010 T-Comprehensive Metabolic Panel [phone]) ... 06/12/2010 T-CBC w/Diff ([id number]) ... 06/12/2010         Medication Adherence: 03/14/2010   Adherence to medications reviewed with patient. Counseling to provide adequate adherence provided   Prevention For Positives: 03/14/2010   Safe sex practices discussed with patient. Condoms offered.                            Appended Document: F/U/VS    Clinical Lists Changes  Orders: Added new Service  order of Pneumococcal Vaccine (96295) - Signed Added new Service order of Admin 1st Vaccine (28413) - Signed Added new Service order of Admin 1st Vaccine [facility]) [phone]) - Signed Observations: Added new observation of PNEUMOVAXVIS: 01/17/09 version given March 14, 2010. (03/14/2010 15:21) Added new observation of PNEUMOVAXLOT: 1723AA (03/14/2010 15:21) Added new observation of PNEUMOVAXEXP: 09/08/2011 (03/14/2010 15:21) Added new observation of PNEUMOVAXBY: Golden Circle RN (03/14/2010 15:21) Added new observation of PNEUMOVAXRTE: IM (03/14/2010 15:21) Added new observation of PNEUMOVAXMFR: Merck (03/14/2010 15:21) Added new observation of PNEUMOVAXSIT: right deltoid (03/14/2010 15:21) Added new observation of PNEUMOVAX: Pneumovax (03/14/2010 15:21)       Pneumovax Vaccine    Vaccine Type: Pneumovax    Site: right deltoid    Mfr: Merck    Dose: 0.5 ml    Route: IM    Given by: Golden Circle RN    Exp. Date: 09/08/2011    Lot #: [id number]    VIS given: 01/17/09 version given March 14, 2010.

## 2010-03-23 ENCOUNTER — Ambulatory Visit: Payer: Self-pay | Admitting: Family Medicine

## 2010-03-29 ENCOUNTER — Telehealth (INDEPENDENT_AMBULATORY_CARE_PROVIDER_SITE_OTHER): Payer: Self-pay | Admitting: *Deleted

## 2010-04-05 NOTE — Progress Notes (Signed)
  Phone Note Other Incoming   Summary of Call: ADAP submitted/RW docs filed

## 2010-04-20 NOTE — Miscellaneous (Signed)
Summary: lab orders  Clinical Lists Changes  Orders: Added new Test order of T-Chlamydia  Probe, urine [phone]) - Signed Added new Test order of T-CBC w/Diff [phone]) - Signed Added new Test order of T-CD4SP [facility]) (CD4SP) - Signed Added new Test order of T-GC Probe, urine [phone]) - Signed Added new Test order of T-Comprehensive Metabolic Panel [phone]) - Signed Added new Test order of T-Hepatitis C Viral Load [phone]) - Signed Added new Test order of T-Lipid Profile ([id number]) - Signed Added new Test order of T-RPR (Syphilis) [phone]) - Signed Added new Test order of T-Urinalysis ([id number]) - Signed Added new Test order of T-HIV Viral Load ([id number]) - Signed

## 2010-04-21 ENCOUNTER — Ambulatory Visit: Payer: Self-pay | Admitting: Family Medicine

## 2010-04-21 ENCOUNTER — Other Ambulatory Visit: Payer: Self-pay | Admitting: Family Medicine

## 2010-04-21 DIAGNOSIS — N951 Menopausal and female climacteric states: Secondary | ICD-10-CM

## 2010-04-21 DIAGNOSIS — Z01419 Encounter for gynecological examination (general) (routine) without abnormal findings: Secondary | ICD-10-CM

## 2010-04-25 ENCOUNTER — Encounter: Payer: Self-pay | Admitting: Infectious Diseases

## 2010-04-26 ENCOUNTER — Encounter (INDEPENDENT_AMBULATORY_CARE_PROVIDER_SITE_OTHER): Payer: Self-pay | Admitting: *Deleted

## 2010-04-26 ENCOUNTER — Telehealth (INDEPENDENT_AMBULATORY_CARE_PROVIDER_SITE_OTHER): Payer: Self-pay | Admitting: *Deleted

## 2010-04-27 LAB — T-HELPER CELL (CD4) - (RCID CLINIC ONLY)
CD4 % Helper T Cell: 33 % (ref 33–55)
CD4 T Cell Abs: 600 uL (ref 400–2700)

## 2010-04-30 LAB — T-HELPER CELL (CD4) - (RCID CLINIC ONLY)
CD4 % Helper T Cell: 35 % (ref 33–55)
CD4 T Cell Abs: 720 uL (ref 400–2700)

## 2010-05-02 NOTE — Progress Notes (Signed)
  Phone Note Other Incoming   Summary of Call: ADAP approved.  RW docs submitted to Kandice Robinsons this date.

## 2010-05-02 NOTE — Miscellaneous (Signed)
Summary: pt. requesting RX for ensure  Clinical Lists Changes  Medications: Added new medication of ENSURE  LIQD (NUTRITIONAL SUPPLEMENTS) drink one can twice a day - Signed Rx of ENSURE  LIQD (NUTRITIONAL SUPPLEMENTS) drink one can twice a day;  #2 cases x prn;  Signed;  Entered by: Wendall Mola CMA ( AAMA);  Authorized by: Johny Sax MD;  Method used: Print then Give to Patient    Prescriptions: ENSURE  LIQD (NUTRITIONAL SUPPLEMENTS) drink one can twice a day  #2 cases x prn   Entered by:   Wendall Mola CMA ( AAMA)   Authorized by:   Johny Sax MD   Signed by:   Wendall Mola CMA ( AAMA) on 04/25/2010   Method used:   Print then Give to Patient   RxID:   [id number]

## 2010-05-02 NOTE — Miscellaneous (Signed)
Summary: adap approved til 11/12/10  Clinical Lists Changes  Observations: Added new observation of AIDSDAP: Yes 2012 (04/26/2010 10:03) Added new observation of PCTFPL: 73.51  (04/26/2010 10:03) Added new observation of HOUSEINCOME: 16109  (04/26/2010 10:03) Added new observation of FINASSESSDT: 03/23/2010  (04/26/2010 10:03)

## 2010-05-05 NOTE — Progress Notes (Signed)
NAME:  [name], [name]          ACCOUNT NO.:  [id number]  MEDICAL RECORD NO.:  [id number]           PATIENT TYPE:  A  LOCATION:  [facility]                   FACILITY:  [facility]  PHYSICIAN:  Tinnie Gens, MD        DATE OF BIRTH:  03/11/1960  DATE OF SERVICE:  04/21/2010                                 CLINIC NOTE  CHIEF COMPLAINT:  Yearly exam.  HISTORY OF PRESENT ILLNESS:  The patient is a 50 year old, gravida 4, para 2-0-2-2, who is [facility] for history of abnormal Pap smear, LEEP, HIV, and needing a Pap smear and mammogram.  On review of records, her last mammogram was in July of last year and was normal.  She has a mammogram scholarship, but will not schedule her until that has been at least a year since her last one. She has previously been treated with Prempro for hot flashes and symptomatic menopause.  She is presently having a cycle about once every 4-6 months.  She reports Prempro works for her in the past and did lead to increased bleeding at all.  PAST MEDICAL HISTORY:  Significant for HIV, asthma, history of heart murmur.  PAST SURGICAL HISTORY:  She has had a cholecystectomy, tracheostomy, and wrist tendon repair.  MEDICATIONS:  She is on Atripla one p.o. daily, ibuprofen as needed for pain.  ALLERGIES:  ASPIRIN.  OB HISTORY:  She is G4, P2, with 2 vaginal deliveries.  She has a 40- year-old son and a 64 year old daughter.  GYN HISTORY:  She has a history of abnormal Pap and colposcopy according to records from Infectious Disease.  FAMILY HISTORY:  Mother and sister both had cancer.  SOCIAL HISTORY:  The patient works.  She is a smoker approximately half pack per day for the last 26 years.  No tobacco or drug use anymore. 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  REVIEW OF SYSTEMS:  A 14-point review of systems is reviewed.  The patient really is most worried about her weight loss.  She is 166 pounds when she was  here in this office in December 2007.  Her treatment slowly lose the weight since then she is 5 feet 10 inches, and today she weighs 129.8.  After she discussed this with her Infectious Disease doctor, Dr. Ninetta Lights, she states that are not really.  She denies chest pain, shortness of breath, headache, vision changes, nausea, vomiting, diarrhea, constipation blood in stool, blood in her urine.  PHYSICAL EXAMINATION:  VITAL SIGNS:  On exam today, blood pressure 116/79, temperature 98.2, weight 129.80, pulse 81. GENERAL:  She is a thin female, who is cachectic in appearance. HEENT:  Normocephalic, atraumatic.  Sclerae anicteric. NECK:  Supple.  Normal thyroid. LUNGS:  Clear bilaterally. CV:  Regular rate and rhythm. No rubs, gallops, or murmurs. ABDOMEN:  Soft, nontender, nondistended.  No masses. EXTREMITIES:  No cyanosis, clubbing or edema.  2+ distal pulses. BREASTS:  Symmetric with everted nipples.  No masses.  She has diffuse fibrocystic change bilaterally.  No supraclavicular or axillary adenopathy. GU:  Normal external female genitalia.  BUS norma.  Vagina is pink and rugated.  Cervix is parous without lesions.  Uterus is small, anteverted.  No adnexal mass or tenderness.  IMPRESSION: 1. GYN exam with Pap smear. 2. Human immunodeficiency virus disease. 3. Menopausal symptoms, that had required treatment in the past.  PLAN: 1. Restart Prempro 6.25/2.5 daily. 2. Continue her Atripla. 3. Pap smear today. 4. Schedule mammogram for July 2012. 5. Explore weight loss with her primary care provider.          ______________________________ Tinnie Gens, MD    TP/MEDQ  D:  04/21/2010  T:  04/22/2010  Job:  [id number]

## 2010-05-16 LAB — POCT URINALYSIS DIP (DEVICE)
Bilirubin Urine: NEGATIVE
Nitrite: POSITIVE — AB
Protein, ur: 30 mg/dL — AB
pH: 5 (ref 5.0–8.0)

## 2010-05-22 LAB — POCT PREGNANCY, URINE: Preg Test, Ur: NEGATIVE

## 2010-05-24 LAB — HEMOGLOBIN AND HEMATOCRIT, BLOOD
HCT: 39 % (ref 36.0–46.0)
Hemoglobin: 12.9 g/dL (ref 12.0–15.0)

## 2010-05-30 LAB — T-HELPER CELL (CD4) - (RCID CLINIC ONLY): CD4 % Helper T Cell: 31 % — ABNORMAL LOW (ref 33–55)

## 2010-06-27 NOTE — Group Therapy Note (Signed)
NAME:  [name], [name]          ACCOUNT NO.:  [id number]   MEDICAL RECORD NO.:  [id number]          PATIENT TYPE:  WOC   LOCATION:  [facility]                   FACILITY:  [facility]   PHYSICIAN:  Allie Bossier, MD        DATE OF BIRTH:  1960/08/20   DATE OF SERVICE:  08/05/2008                                  CLINIC NOTE   [name] is a 50 year old single black gravida 4, para 2, abortus 2  who was sent here from the family practice clinic for evaluation of  irregular periods.  [name] tells me that for the last 2 years she  will have a period and then skip 1-2 months and have another period.  These periods are of normal duration, lasting approximately 3-4 days.  They are not particularly heavy and she never has clots.  She has also  been experiencing night sweats for 1 year and mood swings in the last 6  months.  She does tell me that her Pap smear has not been done for over  a year, the same status for her mammogram.   BREAST EXAM:  Normal.  ABDOMINAL EXAM:  Benign.  External genitalia:  Normal.  Normal estrogen effect of her mucosa.  Cervix parous, no lesions.  Uterus is 6-week size, globular, consistent  with fibroids, nontender.  Adnexa are nontender and nonenlarged.   ASSESSMENT/PLAN:  1. History of skipping periods.  I have explained to her that this is      entirely fitting with her perimenopausal age.  2. Health maintenance.  I have done a Pap smear and we will schedule a      mammogram.      Allie Bossier, MD     MCD/MEDQ  D:  08/05/2008  T:  08/05/2008  Job:  [id number]

## 2010-06-27 NOTE — Op Note (Signed)
[name], [name]          ACCOUNT NO.:  [id number]   MEDICAL RECORD NO.:  [id number]          PATIENT TYPE:  AMB   LOCATION:  DAY                          FACILITY:  [facility]   PHYSICIAN:  Almond Lint, MD       DATE OF BIRTH:  10-Jul-1960   DATE OF PROCEDURE:  06/01/2008  DATE OF DISCHARGE:                               OPERATIVE REPORT   PREOPERATIVE DIAGNOSES:  Symptomatic cholelithiasis.   POSTOPERATIVE DIAGNOSES:  Symptomatic cholelithiasis.   PROCEDURE PERFORMED:  Laparoscopic cholecystectomy.   SURGEON:  Almond Lint, MD   ASSISTANT:  Lennie Muckle, MD.   ANESTHESIA:  General and local.   FINDINGS:  Large stone and non-inflamed gallbladder.   SPECIMEN:  Gallbladder to pathology.   ESTIMATED BLOOD LOSS:  Minimal.   COMPLICATIONS:  None known.   PROCEDURE:  The patient was identified in the holding area and taken to  the operating room where she was placed supine on the operating room  table.  General endotracheal anesthesia was induced.  Abdomen was  prepped and draped in a sterile fashion.  A time-out was performed  according to the surgical safety checklist.  When all was correct with  continued.  The infraumbilical skin was anesthetized with a mixture of  0.25% Marcaine plain and 1% lidocaine with epinephrine.  A curvilinear  transverse incision was made in this region.  The subcutaneous tissue  with spread with a Kelly clamp and a Kocher clamp was used to elevate  the umbilical stalk.  The fascia was incised in the midline vertically.  The Tresa Endo was used to ensure that we had entered the peritoneum.  A  Kocher was placed on one side of the fascial incision and a 0 Vicryl was  used to make a pursestring around this incision.  The trocar was  advanced into the abdomen and pneumoperitoneum was achieved to a  pressure of 15 mmHg.  The patient was rotated to the left and placed  into reverse Trendelenburg position.  Attention was then directed to the  epigastric region and under direct visualization an 11 mm port was  placed after administration of local anesthesia.  Two 5-mm ports were  placed in the right upper quadrant in a similar fashion.  The liver had  adhesions over the top of it and these were taken down with Pott  scissors.  The gallbladder fundus was grasped an elevated toward the  head.  The infundibulum was then obscured by peritoneal attachments and  these were taken down bluntly.  The infundibulum was then grasped and  retracted laterally.  Cystic duct was skeletonized.  The cystic artery  was skeletonized and in doing so a small branch off the side was torn  and started to ooze.  The artery was clipped twice on the patient's side  and once on the specimen side and the bleeding stopped.  The peritoneum  was stripped the rest of the way up the side of the gallbladder all the  way up to the liver edge.  The cystic duct was the only structure  entering the gallbladder.  This was clipped three  times on the patient's  side and once on the specimen side.  The duct was then transected.  The  Bovie electrocautery was used to take the gallbladder off of the  gallbladder fossa.  The camera was switched to the epigastric port and  the gallbladder was placed in the EndoCatch bag and removed through the  umbilical cord.  The camera was relocated at the umbilical port and the  gallbladder fossa examined for bleeding.  Additionally the clips were  examined.  There was no sign of bleeding or biliary leakage.  Small  amount of irrigation was used to wash the region.  Again the gallbladder  fossa was reinspected for bleeding and biliary leakage and none was  seen.  The epigastric and right upper quadrant ports were removed under  direct visualization with no bleeding seen.  Pneumoperitoneum was  allowed to evacuate from the umbilical port and then the Hasson trocar  was removed.  The fascia was closed with the pursestring suture.  This   was then palpated to ensure that no additional fascial defect was  present.  The skin was then closed using 4-0 Monocryl in a running  subcuticular fashion.  The skin was clean, dry and dressed with  Dermabond.      Almond Lint, MD  Electronically Signed     FB/MEDQ  D:  06/01/2008  T:  06/01/2008  Job:  [id number]   cc:   Lacretia Leigh. Ninetta Lights, M.D.  Fax: [phone]

## 2010-06-30 NOTE — Discharge Summary (Signed)
[place]  Patient:    [name], [name] Visit Number: [id number] MRN: [id number]          Service Type: MED Location: [phone] Attending Physician:  Farley Ly Dictated by:   Dwana Melena, M.D. Admit Date:  08/13/2001 Discharge Date: 08/15/2001   CC:         [facility] c/o Dwana Melena, M.D.   Discharge Summary  DATE OF BIRTH:  07-30-1960  DISCHARGE DIAGNOSES: 1. Community-acquired pneumonia. 2. Bacterial vaginosis. 3. Volume depletion. 4. Human immunodeficiency virus positive. 5. Substance abuse:  cocaine, tobacco, alcohol.  DISCHARGE MEDICATIONS: 1. Tequin 400 mg one p.o. q.d. x7 days. 2. Flagyl 500 mg one p.o. b.i.d. x7 days. 3. Combivir one p.o. b.i.d. 4. Viracept 1250 mg five tablets at once p.o. b.i.d. 5. Zulfadim 10 mg one p.o. q.h.s. 6. Tylenol 325 mg one to two tablets p.o. q.6h. p.r.n. fever or pain.  FOLLOW-UP:  The patient will follow up with Dr. Margo Aye in the outpatient clinic at [facility] to be scheduled by the clinic either Tuesday or Wednesday, 8 or 9 of July or the following Wednesday, July 16.  CHIEF COMPLAINT:  Shoulder and lower back pain, nausea, chills, and productive cough x3 days, and shortness of breath.  HISTORY OF PRESENT ILLNESS:  Ms. [name] is a 50 year old African-American female with past medical history significant for HIV positive, previous CD4 count was 560 and viral load less than 400 in November of 2002.  She presented with one-week history of shoulder and lower back pain, nausea, chills, productive cough x3 days, positive shortness of breath, also has lower abdominal pain and pelvic pain following being seen in the [facility]  Positive URI contact with 71-year-old son.  Denies any chest pain, denies any dysuria, denies any vaginal discharge.  Positive cocaine use three days previous to admission.  Chest x-ray was taken and started on Tequin 400 mg IV.  She was also  feeling lightheaded x1 day prior to admission, but denies any hemoptysis. No vomiting.  She was admitted to work up all of these issues.  PAST MEDICAL HISTORY: 1. HIV x10 years noncompliant with medicines. 2. History of VDRF secondary to pneumonia. 3. Substance abuse. 4. Depression.  FAMILY HISTORY:  Sister had lung cancer and mother died with bone cancer. Her father is alive and healthy.  SOCIAL HISTORY:  She lives with a 68-year-old son, a 64 year old daughter, and her sister.  She has been out of work x7 months.  She smokes 1/2 to 1 pack per day.  She drinks 1-1/2 quarts of EtOH daily.  Positive cocaine use.  MEDICATIONS:  The only medications she was on at home were Combivir and Viracept.  ALLERGIES:  No known drug allergies.  REVIEW OF SYSTEMS:  See HPI.  PHYSICAL EXAMINATION:  VITAL SIGNS: On discharge, TMAX/24 is 101.2, T.current is 98.0, blood pressure 117/20, pulse 78, respirations 18, 97% on room air. GENERAL: African-American female resting in bed in no acute distress.  HEENT: Normocephalic and atraumatic.  Pupils are equally round and reactive to light and accommodation.  Oropharynx is clear.  Mild erythema seen in posterior oropharynx.  HEART:  Regular rate and rhythm with no murmurs.  LUNGS: Decreased left lower lobe breath sounds, good air movement throughout. Improvement in left lower lobe breath sounds over previous assessment. ABDOMEN: Soft, mildly tender left greater than right, more superficial pain, minimal deep pain to palpation.  EXTREMITIES: No edema, 2+ pulses  bilaterally. NEUROLOGICAL: Cranial nerves II-XII intact.  Strength 5/5 in all extremities. Alert and oriented x3.  LABORATORY DATA:  Blood culture still pending, no growth to point of discharge. Sputum evaluation deemed not a good specimen.  CD4 count 360, viral load still pending.  Chest x-ray on July 3, read as interval enlargement of focal opacity in right upper lobe, may represent  inflammatory focus.  Bilateral lower lobar peribronchial infiltrates compatible with acute pneumonia.  Discharge CBC; White blood cell count 13.7, hemoglobin 11.7, hematocrit 35.5, platelets 152.  BMP on discharge; sodium 136, potassium 3.5, chloride 106, CO2 24, BUN 9, creatinine 0.9, glucose 68, calcium 8.4.  Liver studies from July 2, ALT 24, alkaline phosphatase 115, total bilirubin 0.6, lipase 16. Urinalysis from July 2; specific gravity 1.027, pH 6.0, bilirubin small, ketones 15, hyaline casts seen, LE moderate, nit negative, bacteria rare, White blood cells 7 to 10, protein 100.  ASSESSMENT:  This is a 50 year old African-American female HIV positive who presented with shoulder and lower back pain, nausea, chills, productive cough x3 days.  She is much improved today, cough better, less production of sputum. No shortness of breath.  Pain in abdomen has lessened.  Appears to be more musculoskeletal strain from coughing, but may be some underlying infection of uterus (PID) or kidney infection (pyelonephritis).  PROBLEM LIST:  #1 - Community-acquired pneumonia.  Given HIV status treating as such with 10 days of Tequin 400 mg.  Received three days of IV Tequin 400 mg in the hospital and was given seven days of p.o. medication.  Encouraged to take all medication as written.  #2 - Abdominal pain.  The pain has decreased and appears to be more superficial pain related to musculoskeletal.  Could be underlying PID or pyelonephritis which should respond to Tequin.  Pap smear was taken. Gonorrhea and Chlamydia culture sent.  Wet prep done.  #3 - Bacterial vaginosis.  The patient was given Flagyl 500 mg one p.o. b.i.d. x7 days.  She was discouraged from drinking alcohol due to disulfaram reaction with Flagyl.  She is told it is a sexually transmitted diseases and precautions should be taken.  #4 - Volume depletion.  Resolved with IV fluids.  May have been caused from  nausea and  decreased intake.  Also given substance abuse issues, malnutrition is certainly a possibility and decreased fluid intake.  Encouraged fluid intake upon discharge.  #5 - HIV positive.  Continue treatment with Combivir and Viracept.  She is currently on no prophylactic treatment for any infections.  #6 - Substance abuse.  She has substance abuse issues with both tobacco use, alcohol use, and cocaine use.  Discussed briefly with the patient about decreasing smoking and consideration of stopping smoking as well as decreasing alcohol consumption and no alcohol consumption while she is on flagyl. Cocaine use not addressed. Dictated by:   Dwana Melena, M.D. Attending Physician:  Farley Ly DD:  08/15/01 TD:  08/19/01 Job: [id number]

## 2010-06-30 NOTE — Group Therapy Note (Signed)
[name], [name]          ACCOUNT NO.:  [id number]   MEDICAL RECORD NO.:  [id number]          PATIENT TYPE:  WOC   LOCATION:  [facility]                   FACILITY:  [facility]   PHYSICIAN:  Argentina Donovan, MD        DATE OF BIRTH:  06-Mar-1960   DATE OF SERVICE:  02/07/2006                                  CLINIC NOTE   The patient is a 50 year old African American female with HIV and on  many drugs, who comes in complaining of 2 years of amenorrhea up until  recently, when she is started bleeding and having severe breast  tenderness.  In talking to the patient, I find that she has been for the  last 2 years on Megace for appetite stimulation and has recently stopped  it.  I have explained to her how Megace works.  I explained to her the  cycle of estrogen, progesterone, and what we have suggested is she takes  her Megace for 21 days each month and stays off until the end of the  month.  Hopefully, that will put her back on a normal cycle.  If it does  not, she will return and we will talk about some alternatives.           ______________________________  Argentina Donovan, MD     PR/MEDQ  D:  02/07/2006  T:  02/07/2006  Job:  [id number]

## 2010-07-03 ENCOUNTER — Other Ambulatory Visit: Payer: Self-pay | Admitting: Obstetrics & Gynecology

## 2010-07-03 DIAGNOSIS — Z1231 Encounter for screening mammogram for malignant neoplasm of breast: Secondary | ICD-10-CM

## 2010-08-31 DIAGNOSIS — B20 Human immunodeficiency virus [HIV] disease: Secondary | ICD-10-CM

## 2010-09-06 ENCOUNTER — Other Ambulatory Visit (INDEPENDENT_AMBULATORY_CARE_PROVIDER_SITE_OTHER): Payer: Self-pay

## 2010-09-06 DIAGNOSIS — B20 Human immunodeficiency virus [HIV] disease: Secondary | ICD-10-CM

## 2010-09-06 LAB — CBC WITH DIFFERENTIAL/PLATELET
Basophils Absolute: 0 10*3/uL (ref 0.0–0.1)
Basophils Relative: 0 % (ref 0–1)
Eosinophils Absolute: 0.1 10*3/uL (ref 0.0–0.7)
Eosinophils Relative: 2 % (ref 0–5)
HCT: 41.9 % (ref 36.0–46.0)
Hemoglobin: 12.8 g/dL (ref 12.0–15.0)
Lymphocytes Relative: 26 % (ref 12–46)
Lymphs Abs: 1.8 10*3/uL (ref 0.7–4.0)
MCH: 29.4 pg (ref 26.0–34.0)
MCHC: 30.5 g/dL (ref 30.0–36.0)
MCV: 96.1 fL (ref 78.0–100.0)
Monocytes Absolute: 0.4 10*3/uL (ref 0.1–1.0)
Monocytes Relative: 6 % (ref 3–12)
Neutro Abs: 4.4 10*3/uL (ref 1.7–7.7)
Neutrophils Relative %: 66 % (ref 43–77)
Platelets: 226 10*3/uL (ref 150–400)
RBC: 4.36 MIL/uL (ref 3.87–5.11)
RDW: 13.8 % (ref 11.5–15.5)
WBC: 6.7 10*3/uL (ref 4.0–10.5)

## 2010-09-07 LAB — COMPLETE METABOLIC PANEL WITH GFR
ALT: 19 U/L (ref 0–35)
AST: 29 U/L (ref 0–37)
Albumin: 4.2 g/dL (ref 3.5–5.2)
Alkaline Phosphatase: 104 U/L (ref 39–117)
BUN: 11 mg/dL (ref 6–23)
CO2: 27 mEq/L (ref 19–32)
Calcium: 9.4 mg/dL (ref 8.4–10.5)
Chloride: 105 mEq/L (ref 96–112)
Creat: 1.04 mg/dL (ref 0.50–1.10)
GFR, Est African American: >60 mL/min (ref >60)
GFR, Est Non African American: 56 mL/min — ABNORMAL LOW (ref >60)
Glucose, Bld: 98 mg/dL (ref 70–99)
Potassium: 4.9 mEq/L (ref 3.5–5.3)
Sodium: 140 mEq/L (ref 135–145)
Total Bilirubin: 0.4 mg/dL (ref 0.3–1.2)
Total Protein: 7.7 g/dL (ref 6.0–8.3)

## 2010-09-07 LAB — HIV-1 RNA QUANT-NO REFLEX-BLD
HIV 1 RNA Quant: <20 copies/mL (ref <20)
HIV-1 RNA Quant, Log: <1.3 {Log} (ref <1.30)

## 2010-09-07 LAB — T-HELPER CELL (CD4) - (RCID CLINIC ONLY)
CD4 % Helper T Cell: 29 % — ABNORMAL LOW (ref 33–55)
CD4 T Cell Abs: 570 uL (ref 400–2700)

## 2010-09-11 ENCOUNTER — Ambulatory Visit (HOSPITAL_COMMUNITY)
Admission: RE | Admit: 2010-09-11 | Discharge: 2010-09-11 | Disposition: A | Discharge status: Home / Self Care | Payer: Self-pay | Source: Ambulatory Visit | Attending: Obstetrics & Gynecology | Admitting: Obstetrics & Gynecology

## 2010-09-11 DIAGNOSIS — Z1231 Encounter for screening mammogram for malignant neoplasm of breast: Secondary | ICD-10-CM

## 2010-09-20 ENCOUNTER — Ambulatory Visit (INDEPENDENT_AMBULATORY_CARE_PROVIDER_SITE_OTHER): Payer: Self-pay | Admitting: Infectious Diseases

## 2010-09-20 ENCOUNTER — Telehealth: Payer: Self-pay | Admitting: Infectious Diseases

## 2010-09-20 ENCOUNTER — Encounter: Payer: Self-pay | Admitting: Infectious Diseases

## 2010-09-20 DIAGNOSIS — Z113 Encounter for screening for infections with a predominantly sexual mode of transmission: Secondary | ICD-10-CM

## 2010-09-20 DIAGNOSIS — B1710 Acute hepatitis C without hepatic coma: Secondary | ICD-10-CM

## 2010-09-20 DIAGNOSIS — B20 Human immunodeficiency virus [HIV] disease: Secondary | ICD-10-CM

## 2010-09-20 DIAGNOSIS — F329 Major depressive disorder, single episode, unspecified: Secondary | ICD-10-CM

## 2010-09-20 DIAGNOSIS — Z79899 Other long term (current) drug therapy: Secondary | ICD-10-CM

## 2010-09-20 MED ORDER — SERTRALINE HCL 25 MG PO TABS: 25.0000 mg | ORAL_TABLET | Freq: Every day | ORAL | Status: DC

## 2010-09-20 NOTE — Assessment & Plan Note (Signed)
Appears to be doing well. Hep A immune.

## 2010-09-20 NOTE — Telephone Encounter (Signed)
[name] was in today for an appt with Dr. Ninetta Lights.  Her eligibility through [employer] for Zoloft expired on 8/2.  A new application was completed and once Dr. Ninetta Lights signs the application it will be mailed.  Will need to check the status in one week.

## 2010-09-20 NOTE — Progress Notes (Signed)
  Subjective:    Patient ID: [name], female    DOB: December 28, 1960, 50 y.o.   MRN: [id number]  HPI 50 yo F with a Hx of HIV+, DUB. Has been on atripla (for convenience) since 2007 after having been successfully maintained on NFV for many years. No problems with ART.  CD4 570 and VL <20 (09-06-10), Hep C VL 406,000 (02-28-10).  She has been stressed with taking care of her son who is ADHD and has behavioral issues. She is not sure if she will have to throw him out of the house. She has been losing wt, not eating well, had trouble with having enough food (out of work over the summer, food stamps only $15/wk).     Review of Systems  Constitutional: Positive for unexpected weight change.       Objective:   Physical Exam  Constitutional: She appears well-developed.  Eyes: EOM are normal. Pupils are equal, round, and reactive to light.  Neck: Neck supple.  Cardiovascular: Normal rate, regular rhythm and normal heart sounds.   Pulmonary/Chest: Effort normal and breath sounds normal. She has no wheezes. She has no rales.  Abdominal: Soft. Bowel sounds are normal. There is no tenderness. There is no rebound.  Lymphadenopathy:    She has no cervical adenopathy.          Assessment & Plan:

## 2010-09-20 NOTE — Assessment & Plan Note (Signed)
She is doing well. Will continue her current meds. She is offered food from the food pantry (refuses). Encourage to take ensure after she takes a full meal. Given condoms. Will see her back in 4-5 months.

## 2010-09-20 NOTE — Assessment & Plan Note (Signed)
Will start her on Zoloft and have her back in 4 months. She will meet med assistance personnel.

## 2010-09-27 NOTE — Telephone Encounter (Signed)
Called [employer] to check status of renewal application.  Approved through 09/26/2011. Order # [id number] for Zoloft was shipped on 09/27/2010 and will take 7 - 10 days for delivery.  Called patient to make aware, there was no answer and I was not able to leave a message.

## 2010-09-29 ENCOUNTER — Telehealth: Payer: Self-pay | Admitting: Licensed Clinical Social Worker

## 2010-09-29 NOTE — Telephone Encounter (Signed)
I called patient to let her know medications arrived today from patient assistance.

## 2010-10-12 ENCOUNTER — Other Ambulatory Visit: Payer: Self-pay | Admitting: Licensed Clinical Social Worker

## 2010-10-12 DIAGNOSIS — B20 Human immunodeficiency virus [HIV] disease: Secondary | ICD-10-CM

## 2010-10-12 MED ORDER — EFAVIRENZ-EMTRICITAB-TENOFOVIR 600-200-300 MG PO TABS: 1.0000 | ORAL_TABLET | Freq: Every day | ORAL | Status: DC

## 2010-11-03 LAB — T-HELPER CELL (CD4) - (RCID CLINIC ONLY)
CD4 % Helper T Cell: 27 — ABNORMAL LOW
CD4 T Cell Abs: 960

## 2010-11-13 ENCOUNTER — Other Ambulatory Visit: Payer: Self-pay | Admitting: *Deleted

## 2010-11-13 DIAGNOSIS — B20 Human immunodeficiency virus [HIV] disease: Secondary | ICD-10-CM

## 2010-11-13 MED ORDER — ENSURE PO LIQD: 1.0000 | Freq: Two times a day (BID) | ORAL | Status: DC

## 2010-11-14 LAB — COMPREHENSIVE METABOLIC PANEL
Albumin: 3.4 — ABNORMAL LOW
BUN: 8
Calcium: 8.7
Chloride: 108
Creatinine, Ser: 0.85
Total Bilirubin: 1.3 — ABNORMAL HIGH

## 2010-11-14 LAB — LIPASE, BLOOD: Lipase: 21

## 2010-11-14 LAB — DIFFERENTIAL
Basophils Absolute: 0
Lymphocytes Relative: 24
Monocytes Absolute: 0.2
Neutro Abs: 5.4

## 2010-11-14 LAB — CBC
HCT: 36.6
MCHC: 32.5
MCV: 94.5
Platelets: 202
RDW: 12.8
WBC: 7.5

## 2010-11-14 LAB — URINE CULTURE: Colony Count: >=100000

## 2010-11-14 LAB — URINE MICROSCOPIC-ADD ON: RBC / HPF: NONE SEEN

## 2010-11-14 LAB — URINALYSIS, ROUTINE W REFLEX MICROSCOPIC
Glucose, UA: NEGATIVE
Hgb urine dipstick: NEGATIVE
Protein, ur: NEGATIVE
pH: 6

## 2010-11-24 ENCOUNTER — Telehealth: Payer: Self-pay | Admitting: Infectious Diseases

## 2010-11-24 LAB — T-HELPER CELL (CD4) - (RCID CLINIC ONLY)
CD4 % Helper T Cell: 32 — ABNORMAL LOW
CD4 T Cell Abs: 770

## 2010-11-24 NOTE — Telephone Encounter (Signed)
The renewal application for [employer] for Zoloft has been started.  I have not contacted the patient because it doesn't need to go out until the middle of November.

## 2010-11-30 LAB — T-HELPER CELL (CD4) - (RCID CLINIC ONLY)
CD4 % Helper T Cell: 24 — ABNORMAL LOW
CD4 T Cell Abs: 710

## 2010-12-01 ENCOUNTER — Ambulatory Visit (INDEPENDENT_AMBULATORY_CARE_PROVIDER_SITE_OTHER): Payer: Self-pay | Admitting: *Deleted

## 2010-12-01 DIAGNOSIS — Z23 Encounter for immunization: Secondary | ICD-10-CM

## 2010-12-01 DIAGNOSIS — B20 Human immunodeficiency virus [HIV] disease: Secondary | ICD-10-CM

## 2010-12-18 ENCOUNTER — Telehealth: Payer: Self-pay | Admitting: Infectious Diseases

## 2010-12-18 NOTE — Telephone Encounter (Signed)
Called [facility] and re-ordered Zoloft.  Should receive within 7 to 10 days

## 2010-12-25 ENCOUNTER — Telehealth: Payer: Self-pay | Admitting: Infectious Diseases

## 2010-12-25 NOTE — Telephone Encounter (Signed)
Called [employer] - Patient's Zoloft should be here tomorrow 12-26-10.  Does not need to re-apply until July 2013.

## 2010-12-25 NOTE — Telephone Encounter (Signed)
Called [facility].  We should receive her Zoloft tomorrow from [employer] per rep.

## 2010-12-26 ENCOUNTER — Other Ambulatory Visit: Payer: Self-pay | Admitting: *Deleted

## 2010-12-26 DIAGNOSIS — F329 Major depressive disorder, single episode, unspecified: Secondary | ICD-10-CM

## 2010-12-26 MED ORDER — SERTRALINE HCL 25 MG PO TABS: 25.0000 mg | ORAL_TABLET | Freq: Every day | ORAL | Status: DC

## 2010-12-26 NOTE — Telephone Encounter (Signed)
LM for pt that her meds were here

## 2011-01-09 ENCOUNTER — Other Ambulatory Visit (INDEPENDENT_AMBULATORY_CARE_PROVIDER_SITE_OTHER): Payer: Self-pay

## 2011-01-09 ENCOUNTER — Other Ambulatory Visit: Payer: Self-pay | Admitting: Internal Medicine

## 2011-01-09 DIAGNOSIS — Z79899 Other long term (current) drug therapy: Secondary | ICD-10-CM

## 2011-01-09 DIAGNOSIS — Z113 Encounter for screening for infections with a predominantly sexual mode of transmission: Secondary | ICD-10-CM

## 2011-01-09 DIAGNOSIS — B20 Human immunodeficiency virus [HIV] disease: Secondary | ICD-10-CM

## 2011-01-09 LAB — CBC
MCH: 30.3 pg (ref 26.0–34.0)
MCHC: 32.5 g/dL (ref 30.0–36.0)
Platelets: 226 10*3/uL (ref 150–400)

## 2011-01-09 LAB — COMPREHENSIVE METABOLIC PANEL
AST: 33 U/L (ref 0–37)
Albumin: 4 g/dL (ref 3.5–5.2)
Alkaline Phosphatase: 106 U/L (ref 39–117)
Potassium: 4.5 mEq/L (ref 3.5–5.3)
Sodium: 140 mEq/L (ref 135–145)
Total Protein: 7.9 g/dL (ref 6.0–8.3)

## 2011-01-09 LAB — LIPID PANEL: Total CHOL/HDL Ratio: 2.5 Ratio

## 2011-01-09 LAB — RPR

## 2011-01-10 LAB — T-HELPER CELL (CD4) - (RCID CLINIC ONLY)
CD4 % Helper T Cell: 30 % — ABNORMAL LOW (ref 33–55)
CD4 T Cell Abs: 660 uL (ref 400–2700)

## 2011-01-11 LAB — HIV-1 RNA QUANT-NO REFLEX-BLD
HIV 1 RNA Quant: <20 copies/mL (ref <20)
HIV-1 RNA Quant, Log: <1.3 {Log} (ref <1.30)

## 2011-01-23 ENCOUNTER — Encounter: Payer: Self-pay | Admitting: Infectious Diseases

## 2011-01-23 ENCOUNTER — Other Ambulatory Visit: Payer: Self-pay | Admitting: *Deleted

## 2011-01-23 ENCOUNTER — Ambulatory Visit (INDEPENDENT_AMBULATORY_CARE_PROVIDER_SITE_OTHER): Payer: Self-pay | Admitting: Infectious Diseases

## 2011-01-23 DIAGNOSIS — Z113 Encounter for screening for infections with a predominantly sexual mode of transmission: Secondary | ICD-10-CM

## 2011-01-23 DIAGNOSIS — J45909 Unspecified asthma, uncomplicated: Secondary | ICD-10-CM

## 2011-01-23 DIAGNOSIS — B20 Human immunodeficiency virus [HIV] disease: Secondary | ICD-10-CM

## 2011-01-23 DIAGNOSIS — Z79899 Other long term (current) drug therapy: Secondary | ICD-10-CM

## 2011-01-23 MED ORDER — ALBUTEROL SULFATE HFA 108 (90 BASE) MCG/ACT IN AERS: 2.0000 | INHALATION_SPRAY | Freq: Four times a day (QID) | RESPIRATORY_TRACT | Status: DC | PRN

## 2011-01-23 MED ORDER — MEGESTROL ACETATE 625 MG/5ML PO SUSP: 625.0000 mg | Freq: Every day | ORAL | Status: DC

## 2011-01-23 MED ORDER — BUDESONIDE 180 MCG/ACT IN AEPB: 2.0000 | INHALATION_SPRAY | Freq: Two times a day (BID) | RESPIRATORY_TRACT | Status: DC

## 2011-01-23 NOTE — Assessment & Plan Note (Signed)
She is doing well except she needs to gain weight. Will restart her megace. Her vax are up to date as is her PAP. Will see her back in 4-5 months.

## 2011-01-23 NOTE — Assessment & Plan Note (Signed)
The etiology of her cough is ? She did not cough while in visit and is in no distress. She states that her sx remind her of her childhood asthma. Will give her steroid and albuterol inhaler.

## 2011-01-23 NOTE — Progress Notes (Signed)
  Subjective:    Patient ID: [name], female    DOB: 1960-12-02, 50 y.o.   MRN: [id number]  HPI 50 yo F with a Hx of HIV+, DUB. Has been on atripla (for convenience) since 2007 after having been successfully maintained on NFV for many years. No problems with ART.  CD4 660 and VL <20 (01-09-11), NL pap March 2012.  Hep C VL 406,000 (02-28-10).  Still having stress with her son (38 yo with ADHD). Getting home help.  States she has AM cough that reminds her of her childhood asthma.   Review of Systems  Constitutional: Negative for fever, chills, appetite change and unexpected weight change.  Gastrointestinal: Negative for diarrhea and constipation.  Genitourinary: Negative for dysuria.  Hematological: Does not bruise/bleed easily.       Objective:   Physical Exam  Constitutional: She appears well-developed.  HENT:  Mouth/Throat: Oropharynx is clear and moist. No oropharyngeal exudate.  Eyes: EOM are normal. Pupils are equal, round, and reactive to light.  Neck: Neck supple.  Cardiovascular: Normal rate, regular rhythm and normal heart sounds.   Pulmonary/Chest: Effort normal and breath sounds normal.  Abdominal: Soft. Bowel sounds are normal. She exhibits no mass. There is no tenderness. There is no rebound and no guarding.  Lymphadenopathy:    She has no cervical adenopathy.          Assessment & Plan:

## 2011-01-24 ENCOUNTER — Other Ambulatory Visit: Payer: Self-pay | Admitting: *Deleted

## 2011-01-24 DIAGNOSIS — B20 Human immunodeficiency virus [HIV] disease: Secondary | ICD-10-CM

## 2011-01-24 MED ORDER — MEGESTROL ACETATE 625 MG/5ML PO SUSP: 625.0000 mg | Freq: Every day | ORAL | Status: DC

## 2011-01-24 MED ORDER — ALBUTEROL SULFATE HFA 108 (90 BASE) MCG/ACT IN AERS: 2.0000 | INHALATION_SPRAY | Freq: Four times a day (QID) | RESPIRATORY_TRACT | Status: DC | PRN

## 2011-02-02 ENCOUNTER — Telehealth: Payer: Self-pay | Admitting: *Deleted

## 2011-02-02 ENCOUNTER — Other Ambulatory Visit: Payer: Self-pay | Admitting: *Deleted

## 2011-02-02 ENCOUNTER — Other Ambulatory Visit: Payer: Self-pay

## 2011-02-02 DIAGNOSIS — F329 Major depressive disorder, single episode, unspecified: Secondary | ICD-10-CM

## 2011-02-02 MED ORDER — SERTRALINE HCL 25 MG PO TABS: 25.0000 mg | ORAL_TABLET | Freq: Every day | ORAL | Status: DC

## 2011-02-02 NOTE — Telephone Encounter (Signed)
She had left a message asking for a med to be called in for her "hot flashes" I called her back & left a message that she should continue to go to [facility] for her issues since she is established there. I left her the number

## 2011-02-02 NOTE — Telephone Encounter (Signed)
Pt called and stated that she needed [place] to fax over to [facility] the medication that was prescribed for her hot flashes, Prempro 6.25/2.5 so that she could get free samples from the their pharmacy.  I faxed info to [facility] pharmacy at [phone].

## 2011-03-19 ENCOUNTER — Encounter: Payer: Self-pay | Admitting: *Deleted

## 2011-04-11 ENCOUNTER — Encounter: Payer: Self-pay | Admitting: Obstetrics and Gynecology

## 2011-04-11 ENCOUNTER — Ambulatory Visit (INDEPENDENT_AMBULATORY_CARE_PROVIDER_SITE_OTHER): Payer: Self-pay | Admitting: Obstetrics and Gynecology

## 2011-04-11 VITALS — BP 133/89 | HR 101 | Temp 98.1°F | Resp 20 | Ht 69.5 in | Wt 148.5 lb

## 2011-04-11 DIAGNOSIS — Z01419 Encounter for gynecological examination (general) (routine) without abnormal findings: Secondary | ICD-10-CM

## 2011-04-11 MED ORDER — ESTRADIOL 1 MG PO TABS: 1.0000 mg | ORAL_TABLET | Freq: Every day | ORAL | Status: DC

## 2011-04-11 MED ORDER — MEDROXYPROGESTERONE ACETATE 2.5 MG PO TABS: 2.5000 mg | ORAL_TABLET | Freq: Every day | ORAL | Status: DC

## 2011-04-11 MED ORDER — ESTRADIOL 25 MCG VA TABS: 25.0000 ug | ORAL_TABLET | Freq: Every day | VAGINAL | Status: DC

## 2011-04-11 NOTE — Progress Notes (Signed)
Subjective:    Patient ID: [name], female    DOB: Apr 13, 1960, 51 y.o.   MRN: [id number] CC: Hot flashes and irregular menses  HPI G2P2002 AA female describing vasomotor symptoms with hot flashes that occur every night and several times during the day. She would like treatment for the hot flashes as she feels they interfere significantly with activities of daily living .Her periods have been irregular for a year occurring every 2-4 months and at times she has heavy bleeding. Not bleeding at present. Denies intermenstrual spotting or bleeding. FH neg for Gyn or colon cancers. Due for Pap. PMD Dr. Ninetta Lights who she sees every 3 months.    Review of Systems  Constitutional: Positive for unexpected weight change.       Concerned she is undernourished and taking herbs (echinacea) and ensure.  Respiratory: Negative for cough.   Cardiovascular: Negative for chest pain.  Gastrointestinal: Negative for abdominal pain, diarrhea, constipation and abdominal distention.       Describes swelling below rt ribs with exertion that gradually dissipates  Musculoskeletal:       Nightly leg cramps in calves  Skin: Negative for color change.  Psychiatric/Behavioral:       On Zoloft to even mood. Stress with son   Past Medical History  Diagnosis Date  . HIV (human immunodeficiency virus infection)   . Asthma    History reviewed. No pertinent past surgical history. History   Social History  . Marital Status: Single    Spouse Name: N/A    Number of Children: N/A  . Years of Education: N/A   Social History Main Topics  . Smoking status: Current Everyday Smoker -- 0.5 packs/day for 33 years    Types: Cigarettes  . Smokeless tobacco: Never Used  . Alcohol Use: No     recovered alcoholic for 9 years  . 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 Sexually Active: No     pt. given condoms   Other Topics Concern  . None   Social History Narrative  . None   Current Outpatient Prescriptions  on File Prior to Visit  Medication Sig Dispense Refill  . albuterol (PROVENTIL HFA;VENTOLIN HFA) 108 (90 BASE) MCG/ACT inhaler Inhale 2 puffs into the lungs every 6 (six) hours as needed for wheezing.  2 Inhaler  6  . budesonide (PULMICORT) 180 MCG/ACT inhaler Inhale 2 puffs into the lungs 2 (two) times daily.  2 Inhaler  6  . efavirenz-emtrictabine-tenofovir (ATRIPLA) 600-200-300 MG per tablet Take 1 tablet by mouth at bedtime.  30 tablet  11  . ENSURE (ENSURE) Take 1 Can by mouth 2 (two) times daily between meals.  237 mL  11  . sertraline (ZOLOFT) 25 MG tablet Take 1 tablet (25 mg total) by mouth daily.  90 tablet  3   Allergies  Allergen Reactions  . Aspirin     REACTION: ? heart murmur      Objective:   Physical Exam  Constitutional: She is oriented to person, place, and time. No distress.  HENT:  Head: Normocephalic.  Eyes: EOM are normal.  Neck: Neck supple.  Cardiovascular: Normal rate.   Pulmonary/Chest: Effort normal.  Abdominal: Soft. She exhibits no mass. There is no tenderness.  Genitourinary: Vagina normal and uterus normal. No vaginal discharge found.       Minimal atrophy of vaginal mucosa - pale pink with no ruggae. Cx slightly friable with Pap  Musculoskeletal: Normal range of  motion.  Neurological: She is alert and oriented to person, place, and time.  Skin: Skin is warm and dry.  Psychiatric: She has a normal mood and affect.   Breasts; sym, no masses or lymphadenopathy; no nipple discharge        Assessment & Plan:  1. 51 yo with Vasomotor symptoms and perimenopausal bleeding pattern _ Consulted Dr. Debroah Loop re initiation of estradiol 1 mg po qd and Provera 2.5 mg po qd. Recheck in 3 months. Discussed perimenopause at length. 2. HIV 3. Asthma 4. Undernutrition

## 2011-04-11 NOTE — Patient Instructions (Signed)

## 2011-04-11 NOTE — Progress Notes (Signed)
Pt is needing refill of prempro for hot flashes

## 2011-04-16 MED ORDER — MEDROXYPROGESTERONE ACETATE 2.5 MG PO TABS: 2.5000 mg | ORAL_TABLET | Freq: Every day | ORAL | Status: DC

## 2011-04-16 MED ORDER — ESTRADIOL 1 MG PO TABS: 1.0000 mg | ORAL_TABLET | Freq: Every day | ORAL | Status: DC

## 2011-04-16 NOTE — Progress Notes (Addendum)
Addended by: Sherre Lain A on: 04/16/2011 08:50 AM  Modules accepted: Orders  Pt stopped by clinic today because she didn't get her prescription when she left on Wednesday. Prescription reprinted for patient.

## 2011-04-27 ENCOUNTER — Other Ambulatory Visit: Payer: Self-pay | Admitting: *Deleted

## 2011-04-27 DIAGNOSIS — B20 Human immunodeficiency virus [HIV] disease: Secondary | ICD-10-CM

## 2011-05-14 ENCOUNTER — Other Ambulatory Visit (INDEPENDENT_AMBULATORY_CARE_PROVIDER_SITE_OTHER): Payer: Self-pay

## 2011-05-14 ENCOUNTER — Encounter: Payer: Self-pay | Admitting: *Deleted

## 2011-05-14 DIAGNOSIS — B20 Human immunodeficiency virus [HIV] disease: Secondary | ICD-10-CM

## 2011-05-14 DIAGNOSIS — Z79899 Other long term (current) drug therapy: Secondary | ICD-10-CM

## 2011-05-14 DIAGNOSIS — Z113 Encounter for screening for infections with a predominantly sexual mode of transmission: Secondary | ICD-10-CM

## 2011-05-14 LAB — CBC
HCT: 42.6 % (ref 36.0–46.0)
MCH: 30.6 pg (ref 26.0–34.0)
MCHC: 31.9 g/dL (ref 30.0–36.0)
MCV: 95.7 fL (ref 78.0–100.0)
RDW: 13.4 % (ref 11.5–15.5)
WBC: 6.3 10*3/uL (ref 4.0–10.5)

## 2011-05-14 LAB — URINALYSIS, ROUTINE W REFLEX MICROSCOPIC
Bilirubin Urine: NEGATIVE
Glucose, UA: NEGATIVE mg/dL
Specific Gravity, Urine: 1.012 (ref 1.005–1.030)

## 2011-05-14 LAB — COMPLETE METABOLIC PANEL WITH GFR
AST: 35 U/L (ref 0–37)
BUN: 16 mg/dL (ref 6–23)
Calcium: 9.9 mg/dL (ref 8.4–10.5)
Chloride: 103 mEq/L (ref 96–112)
Creat: 1.08 mg/dL (ref 0.50–1.10)
GFR, Est African American: 69 mL/min

## 2011-05-14 LAB — URINALYSIS, MICROSCOPIC ONLY
Casts: NONE SEEN
Crystals: NONE SEEN

## 2011-05-14 LAB — LIPID PANEL
Cholesterol: 155 mg/dL (ref 0–200)
Triglycerides: 108 mg/dL (ref <150)
VLDL: 22 mg/dL (ref 0–40)

## 2011-05-15 LAB — T-HELPER CELL (CD4) - (RCID CLINIC ONLY)
CD4 % Helper T Cell: 32 % — ABNORMAL LOW (ref 33–55)
CD4 T Cell Abs: 770 uL (ref 400–2700)

## 2011-05-21 ENCOUNTER — Other Ambulatory Visit: Payer: Self-pay | Admitting: Licensed Clinical Social Worker

## 2011-05-21 DIAGNOSIS — B20 Human immunodeficiency virus [HIV] disease: Secondary | ICD-10-CM

## 2011-05-21 MED ORDER — ENSURE PO LIQD: 1.0000 | Freq: Two times a day (BID) | ORAL | Status: DC

## 2011-05-28 ENCOUNTER — Ambulatory Visit (INDEPENDENT_AMBULATORY_CARE_PROVIDER_SITE_OTHER): Payer: Self-pay | Admitting: Infectious Diseases

## 2011-05-28 ENCOUNTER — Encounter: Payer: Self-pay | Admitting: Infectious Diseases

## 2011-05-28 ENCOUNTER — Encounter: Payer: Self-pay | Admitting: *Deleted

## 2011-05-28 DIAGNOSIS — F17200 Nicotine dependence, unspecified, uncomplicated: Secondary | ICD-10-CM

## 2011-05-28 DIAGNOSIS — B1710 Acute hepatitis C without hepatic coma: Secondary | ICD-10-CM

## 2011-05-28 DIAGNOSIS — B20 Human immunodeficiency virus [HIV] disease: Secondary | ICD-10-CM

## 2011-05-28 DIAGNOSIS — J45909 Unspecified asthma, uncomplicated: Secondary | ICD-10-CM

## 2011-05-28 DIAGNOSIS — N926 Irregular menstruation, unspecified: Secondary | ICD-10-CM

## 2011-05-28 DIAGNOSIS — N949 Unspecified condition associated with female genital organs and menstrual cycle: Secondary | ICD-10-CM

## 2011-05-28 MED ORDER — NICOTINE 7 MG/24HR TD PT24: 1.0000 | MEDICATED_PATCH | TRANSDERMAL | Status: AC

## 2011-05-28 MED ORDER — NICOTINE 14 MG/24HR TD PT24: 1.0000 | MEDICATED_PATCH | TRANSDERMAL | Status: AC

## 2011-05-28 NOTE — Assessment & Plan Note (Signed)
She is doing well. Taking atripla in the AM without difficulty. Offered condoms. She is using marinol as well as OTCs for her weight.  rtc 6 months with labs prior.

## 2011-05-28 NOTE — Assessment & Plan Note (Signed)
Has improved on OCPs. My great appreciation to GYN.

## 2011-05-28 NOTE — Assessment & Plan Note (Signed)
[name] on [address]  She is encouraged to quit, wants to try patches.

## 2011-05-28 NOTE — Assessment & Plan Note (Signed)
Will contiue to follow her LFTs. Last nl.

## 2011-05-28 NOTE — Assessment & Plan Note (Signed)
She is using her inhalers prn, mostly due to pollen.

## 2011-05-28 NOTE — Progress Notes (Signed)
  Subjective:    Patient ID: [name], female    DOB: 24-Dec-1960, 51 y.o.   MRN: [id number]  HPI 51 yo F with a Hx of HIV+, Hep C. Has been on atripla (for convenience) since 2007 after having been successfully maintained on NFV for many years. Recently started on OCPs for irregular menses and hot flashes.   HIV 1 RNA Quant (copies/mL)  Date Value  05/14/2011 <20   01/09/2011 <20   09/06/2010 <20      CD4 T Cell Abs (cmm)  Date Value  05/14/2011 770   01/09/2011 660   09/06/2010 570     Has been taking vitamin supplements to try and gain wt. She is aware that her smoking is dangerous with her OCPs. Has been LE cramping and occas back pain. Mother had spinal ca.     Review of Systems  Constitutional: Negative for appetite change and unexpected weight change.  Respiratory: Negative for cough and shortness of breath.   Gastrointestinal: Negative for diarrhea and constipation.  Genitourinary: Negative for dysuria and vaginal bleeding.       Objective:   Physical Exam  Constitutional: She appears well-developed and well-nourished.  HENT:  Mouth/Throat: No oropharyngeal exudate.  Eyes: EOM are normal. Pupils are equal, round, and reactive to light. No scleral icterus.  Cardiovascular: Normal rate, regular rhythm and normal heart sounds.   Pulmonary/Chest: Effort normal and breath sounds normal.  Abdominal: Soft. Bowel sounds are normal. She exhibits no distension. There is no tenderness.  Musculoskeletal: She exhibits no edema and no tenderness.       She has no cordis in her calves, no tenderness. They are =, there is no swelling.           Assessment & Plan:

## 2011-08-14 ENCOUNTER — Other Ambulatory Visit: Payer: Self-pay | Admitting: Infectious Diseases

## 2011-08-20 ENCOUNTER — Telehealth: Payer: Self-pay | Admitting: Infectious Diseases

## 2011-08-20 NOTE — Telephone Encounter (Signed)
Called [name], left V/M she needs to call and set up an appointment to re-apply with [facility] for her Zoloft.

## 2011-09-14 ENCOUNTER — Other Ambulatory Visit: Payer: Self-pay | Admitting: *Deleted

## 2011-09-14 DIAGNOSIS — B20 Human immunodeficiency virus [HIV] disease: Secondary | ICD-10-CM

## 2011-09-14 MED ORDER — MEGESTROL ACETATE 625 MG/5ML PO SUSP: 625.0000 mg | Freq: Every day | ORAL | Status: DC

## 2011-09-17 ENCOUNTER — Telehealth: Payer: Self-pay | Admitting: Infectious Diseases

## 2011-09-17 NOTE — Telephone Encounter (Signed)
Called and left another V/M for [name] to return my call to set up an appointment to re-apply for her Zoloft

## 2011-10-12 ENCOUNTER — Other Ambulatory Visit: Payer: Self-pay | Admitting: *Deleted

## 2011-10-12 DIAGNOSIS — B20 Human immunodeficiency virus [HIV] disease: Secondary | ICD-10-CM

## 2011-10-12 MED ORDER — EFAVIRENZ-EMTRICITAB-TENOFOVIR 600-200-300 MG PO TABS: 1.0000 | ORAL_TABLET | Freq: Every day | ORAL | Status: DC

## 2011-11-14 ENCOUNTER — Other Ambulatory Visit (INDEPENDENT_AMBULATORY_CARE_PROVIDER_SITE_OTHER): Payer: Self-pay

## 2011-11-14 DIAGNOSIS — B20 Human immunodeficiency virus [HIV] disease: Secondary | ICD-10-CM

## 2011-11-14 LAB — CBC
HCT: 37.5 % (ref 36.0–46.0)
Hemoglobin: 12 g/dL (ref 12.0–15.0)
MCH: 28.9 pg (ref 26.0–34.0)
MCHC: 32 g/dL (ref 30.0–36.0)
RBC: 4.15 MIL/uL (ref 3.87–5.11)

## 2011-11-14 LAB — COMPREHENSIVE METABOLIC PANEL
Albumin: 3.8 g/dL (ref 3.5–5.2)
Alkaline Phosphatase: 119 U/L — ABNORMAL HIGH (ref 39–117)
BUN: 15 mg/dL (ref 6–23)
CO2: 27 mEq/L (ref 19–32)
Glucose, Bld: 65 mg/dL — ABNORMAL LOW (ref 70–99)
Potassium: 4.6 mEq/L (ref 3.5–5.3)
Sodium: 141 mEq/L (ref 135–145)
Total Bilirubin: 0.4 mg/dL (ref 0.3–1.2)
Total Protein: 7.2 g/dL (ref 6.0–8.3)

## 2011-11-15 LAB — T-HELPER CELL (CD4) - (RCID CLINIC ONLY)
CD4 % Helper T Cell: 31 % — ABNORMAL LOW (ref 33–55)
CD4 T Cell Abs: 580 uL (ref 400–2700)

## 2011-11-15 LAB — HIV-1 RNA QUANT-NO REFLEX-BLD: HIV 1 RNA Quant: <20 copies/mL (ref <20)

## 2011-11-28 ENCOUNTER — Ambulatory Visit: Payer: Self-pay | Admitting: Infectious Diseases

## 2011-12-05 ENCOUNTER — Encounter: Payer: Self-pay | Admitting: Infectious Diseases

## 2011-12-05 ENCOUNTER — Ambulatory Visit (INDEPENDENT_AMBULATORY_CARE_PROVIDER_SITE_OTHER): Payer: Self-pay | Admitting: Infectious Diseases

## 2011-12-05 ENCOUNTER — Telehealth: Payer: Self-pay | Admitting: *Deleted

## 2011-12-05 VITALS — BP 133/86 | HR 91 | Temp 97.2°F | Ht 72.0 in | Wt 151.0 lb

## 2011-12-05 DIAGNOSIS — Z23 Encounter for immunization: Secondary | ICD-10-CM

## 2011-12-05 DIAGNOSIS — B1710 Acute hepatitis C without hepatic coma: Secondary | ICD-10-CM

## 2011-12-05 DIAGNOSIS — Z113 Encounter for screening for infections with a predominantly sexual mode of transmission: Secondary | ICD-10-CM

## 2011-12-05 DIAGNOSIS — M79609 Pain in unspecified limb: Secondary | ICD-10-CM

## 2011-12-05 DIAGNOSIS — M79671 Pain in right foot: Secondary | ICD-10-CM | POA: Insufficient documentation

## 2011-12-05 DIAGNOSIS — Z1231 Encounter for screening mammogram for malignant neoplasm of breast: Secondary | ICD-10-CM

## 2011-12-05 DIAGNOSIS — Z79899 Other long term (current) drug therapy: Secondary | ICD-10-CM

## 2011-12-05 DIAGNOSIS — B20 Human immunodeficiency virus [HIV] disease: Secondary | ICD-10-CM

## 2011-12-05 NOTE — Assessment & Plan Note (Signed)
Will check Hep A Ab levels.

## 2011-12-05 NOTE — Telephone Encounter (Signed)
Called patient to find out if she has the discount card for a podiatry appt.  Left her a message to come in and pick up a mammogram scholarship form, as she is uninsured. Wendall Mola CMA

## 2011-12-05 NOTE — Assessment & Plan Note (Signed)
Suggested she try motrin or ibuprofen, shoe insert. She wants to be sent to podiatry. Will try to get her into podiatry.

## 2011-12-05 NOTE — Addendum Note (Signed)
Addended by: Laurell Josephs on: 12/05/2011 03:52 PM   Modules accepted: Orders

## 2011-12-05 NOTE — Assessment & Plan Note (Signed)
She is doing well. Will send her for mammo. She is interested in signing up for health insurance. She is given condoms. Needs flu shot. See her back in 6 months.

## 2011-12-05 NOTE — Progress Notes (Signed)
  Subjective:    Patient ID: [name], female    DOB: 1960-03-06, 51 y.o.   MRN: [id number]  HPI 51 yo F with a Hx of HIV+, Hep C. Has been on atripla (for convenience) since 2007 after having been successfully maintained on NFV for many years. Previously started on OCPs for irregular menses and hot flashes.  Feeling well. Medicine going well. Still taking ensure/megace.  Having BM q3 days. Never tried laxatives.  Believes she had PAP first of the year.  C/o pain in top of her R foot. Believes she has swelling in her knees. Pain in those as well.   HIV 1 RNA Quant (copies/mL)  Date Value  11/14/2011 <20   05/14/2011 <20   01/09/2011 <20      CD4 T Cell Abs (cmm)  Date Value  11/14/2011 580   05/14/2011 770   01/09/2011 660       Review of Systems  Constitutional: Negative for appetite change and unexpected weight change.  Gastrointestinal: Positive for constipation. Negative for blood in stool.       Objective:   Physical Exam  Constitutional: She appears well-developed and well-nourished.  HENT:  Mouth/Throat: No oropharyngeal exudate.  Eyes: EOM are normal. Pupils are equal, round, and reactive to light.  Neck: Neck supple.  Cardiovascular: Normal rate, regular rhythm and normal heart sounds.   Pulmonary/Chest: Effort normal and breath sounds normal. She has no wheezes. She has no rales.  Abdominal: Soft. Bowel sounds are normal. She exhibits no distension. There is no tenderness.  Lymphadenopathy:    She has no cervical adenopathy.          Assessment & Plan:

## 2011-12-14 ENCOUNTER — Other Ambulatory Visit: Payer: Self-pay | Admitting: Infectious Diseases

## 2011-12-14 DIAGNOSIS — Z1231 Encounter for screening mammogram for malignant neoplasm of breast: Secondary | ICD-10-CM

## 2011-12-24 ENCOUNTER — Other Ambulatory Visit: Payer: Self-pay | Admitting: *Deleted

## 2011-12-24 DIAGNOSIS — B20 Human immunodeficiency virus [HIV] disease: Secondary | ICD-10-CM

## 2011-12-24 MED ORDER — ENSURE PO LIQD: 1.0000 | Freq: Two times a day (BID) | ORAL | Status: DC

## 2012-01-02 ENCOUNTER — Ambulatory Visit (HOSPITAL_COMMUNITY)
Admission: RE | Admit: 2012-01-02 | Discharge: 2012-01-02 | Disposition: A | Discharge status: Home / Self Care | Payer: Self-pay | Source: Ambulatory Visit | Attending: Infectious Diseases | Admitting: Infectious Diseases

## 2012-01-02 DIAGNOSIS — Z1231 Encounter for screening mammogram for malignant neoplasm of breast: Secondary | ICD-10-CM

## 2012-01-07 ENCOUNTER — Telehealth: Payer: Self-pay | Admitting: *Deleted

## 2012-01-07 NOTE — Telephone Encounter (Signed)
Patient called in response to a call made to her by Annice Pih. She wanted to get information on why she needed the [employer]. Advised her that if she needed a referral to another doctors office she may need to have one. Transferred the patient to Britta Mccreedy to find out what information she needs to bring with her to apply for the card and advised her to call Annice Pih back once she had gotten it so she can work on referral she has pending.

## 2012-01-16 ENCOUNTER — Ambulatory Visit: Payer: Self-pay

## 2012-04-09 ENCOUNTER — Encounter: Payer: Self-pay | Admitting: *Deleted

## 2012-05-21 ENCOUNTER — Other Ambulatory Visit: Payer: Self-pay | Admitting: Infectious Diseases

## 2012-05-21 ENCOUNTER — Other Ambulatory Visit (INDEPENDENT_AMBULATORY_CARE_PROVIDER_SITE_OTHER): Payer: No Typology Code available for payment source

## 2012-05-21 DIAGNOSIS — Z79899 Other long term (current) drug therapy: Secondary | ICD-10-CM

## 2012-05-21 DIAGNOSIS — B20 Human immunodeficiency virus [HIV] disease: Secondary | ICD-10-CM

## 2012-05-21 DIAGNOSIS — B1710 Acute hepatitis C without hepatic coma: Secondary | ICD-10-CM

## 2012-05-21 DIAGNOSIS — Z113 Encounter for screening for infections with a predominantly sexual mode of transmission: Secondary | ICD-10-CM

## 2012-05-21 LAB — LIPID PANEL
Cholesterol: 126 mg/dL (ref 0–200)
HDL: 49 mg/dL (ref >39)
LDL Cholesterol: 59 mg/dL (ref 0–99)
Total CHOL/HDL Ratio: 2.6 Ratio
Triglycerides: 90 mg/dL (ref <150)
VLDL: 18 mg/dL (ref 0–40)

## 2012-05-21 LAB — COMPREHENSIVE METABOLIC PANEL
ALT: 22 U/L (ref 0–35)
AST: 29 U/L (ref 0–37)
Albumin: 3.9 g/dL (ref 3.5–5.2)
Alkaline Phosphatase: 128 U/L — ABNORMAL HIGH (ref 39–117)
BUN: 12 mg/dL (ref 6–23)
CO2: 27 mEq/L (ref 19–32)
Calcium: 9.2 mg/dL (ref 8.4–10.5)
Chloride: 105 mEq/L (ref 96–112)
Creat: 0.93 mg/dL (ref 0.50–1.10)
Glucose, Bld: 91 mg/dL (ref 70–99)
Potassium: 3.9 mEq/L (ref 3.5–5.3)
Sodium: 139 mEq/L (ref 135–145)
Total Bilirubin: 0.4 mg/dL (ref 0.3–1.2)
Total Protein: 7.6 g/dL (ref 6.0–8.3)

## 2012-05-21 LAB — CBC
HCT: 38.1 % (ref 36.0–46.0)
Hemoglobin: 12.6 g/dL (ref 12.0–15.0)
MCH: 29.4 pg (ref 26.0–34.0)
MCHC: 33.1 g/dL (ref 30.0–36.0)
MCV: 89 fL (ref 78.0–100.0)
Platelets: 204 10*3/uL (ref 150–400)
RBC: 4.28 MIL/uL (ref 3.87–5.11)
RDW: 13.4 % (ref 11.5–15.5)
WBC: 4 10*3/uL (ref 4.0–10.5)

## 2012-05-21 LAB — RPR: RPR Ser Ql: REACTIVE — AB

## 2012-05-21 LAB — RPR TITER: RPR Titer: 1:1 {titer}

## 2012-05-22 LAB — T-HELPER CELL (CD4) - (RCID CLINIC ONLY)
CD4 % Helper T Cell: 33 % (ref 33–55)
CD4 T Cell Abs: 530 uL (ref 400–2700)

## 2012-05-22 LAB — T.PALLIDUM AB, TOTAL: T pallidum Antibodies (TP-PA): 0.12 S/CO (ref <0.90)

## 2012-05-22 LAB — HEPATITIS A ANTIBODY, TOTAL: Hep A Total Ab: POSITIVE — AB

## 2012-05-23 LAB — HIV-1 RNA QUANT-NO REFLEX-BLD
HIV 1 RNA Quant: <20 copies/mL (ref <20)
HIV-1 RNA Quant, Log: <1.3 {Log} (ref <1.30)

## 2012-06-04 ENCOUNTER — Encounter: Payer: Self-pay | Admitting: *Deleted

## 2012-06-04 ENCOUNTER — Ambulatory Visit (INDEPENDENT_AMBULATORY_CARE_PROVIDER_SITE_OTHER): Payer: No Typology Code available for payment source | Admitting: Infectious Diseases

## 2012-06-04 ENCOUNTER — Encounter: Payer: Self-pay | Admitting: Infectious Diseases

## 2012-06-04 ENCOUNTER — Other Ambulatory Visit: Payer: Self-pay | Admitting: *Deleted

## 2012-06-04 VITALS — BP 134/84 | HR 75 | Temp 98.3°F | Ht 72.0 in | Wt 140.0 lb

## 2012-06-04 DIAGNOSIS — B1710 Acute hepatitis C without hepatic coma: Secondary | ICD-10-CM

## 2012-06-04 DIAGNOSIS — Z0000 Encounter for general adult medical examination without abnormal findings: Secondary | ICD-10-CM

## 2012-06-04 DIAGNOSIS — B20 Human immunodeficiency virus [HIV] disease: Secondary | ICD-10-CM

## 2012-06-04 NOTE — Progress Notes (Signed)
  Subjective:    Patient ID: [name], female    DOB: [birth year], 52 y.o.   MRN: [id number]  HPI 52 yo F with a Hx of HIV+, Hep C. Has been on atripla (for convenience) since 2007 after having been successfully maintained on NFV for many years.  Last PAP 04-11-11 (nl).  Without complaints. Wt down 10#. Getting married August 29, 2013. Partner (-), is aware. Using condoms. Not sexually active, partner has heart failure (just out of hospital). Has been tested regularly.    HIV 1 RNA Quant (copies/mL)  Date Value  05/21/2012 <20   11/14/2011 <20   05/14/2011 <20      CD4 T Cell Abs (cmm)  Date Value  05/21/2012 530   11/14/2011 580   05/14/2011 770      Review of Systems  Constitutional: Positive for unexpected weight change. Negative for appetite change.  Gastrointestinal: Negative for diarrhea and constipation.  Genitourinary: Negative for difficulty urinating.  still having hot flashes.      Objective:   Physical Exam  Constitutional: She appears well-developed and well-nourished.  HENT:  Mouth/Throat: No oropharyngeal exudate.  Eyes: EOM are normal. Pupils are equal, round, and reactive to light.  Neck: Neck supple.  Cardiovascular: Normal rate, regular rhythm and normal heart sounds.   Pulmonary/Chest: Effort normal and breath sounds normal.  Abdominal: Soft. Bowel sounds are normal. There is no tenderness.  Lymphadenopathy:    She has no cervical adenopathy.          Assessment & Plan:

## 2012-06-04 NOTE — Assessment & Plan Note (Addendum)
She is doing very well. She is not sexually active. Will continue her current meds. She is reassured regarding her CD4 counts (dropping but normal). Will see her back. She will make appt for repeat PAP and mammo.

## 2012-06-04 NOTE — Assessment & Plan Note (Signed)
Hep A immune. Will cont to watch her LFTs, consider treatment in future?

## 2012-06-05 ENCOUNTER — Other Ambulatory Visit: Payer: Self-pay | Admitting: Infectious Diseases

## 2012-06-05 ENCOUNTER — Telehealth: Payer: Self-pay | Admitting: *Deleted

## 2012-06-05 DIAGNOSIS — Z0000 Encounter for general adult medical examination without abnormal findings: Secondary | ICD-10-CM

## 2012-06-05 NOTE — Telephone Encounter (Signed)
referal in orders

## 2012-06-05 NOTE — Telephone Encounter (Signed)
Message left for pt to call [facility] [phone]) to make appt to review hormone therapy and for annual PAP smear.

## 2012-07-02 ENCOUNTER — Encounter: Payer: Self-pay | Admitting: Obstetrics & Gynecology

## 2012-07-02 ENCOUNTER — Ambulatory Visit (INDEPENDENT_AMBULATORY_CARE_PROVIDER_SITE_OTHER): Payer: No Typology Code available for payment source | Admitting: Obstetrics & Gynecology

## 2012-07-02 VITALS — BP 101/72 | HR 76 | Temp 98.0°F | Ht 72.0 in | Wt 137.2 lb

## 2012-07-02 DIAGNOSIS — Z01419 Encounter for gynecological examination (general) (routine) without abnormal findings: Secondary | ICD-10-CM

## 2012-07-02 DIAGNOSIS — R232 Flushing: Secondary | ICD-10-CM

## 2012-07-02 MED ORDER — PAROXETINE HCL 10 MG PO TABS: 10.0000 mg | ORAL_TABLET | ORAL | Status: DC

## 2012-07-02 NOTE — Patient Instructions (Signed)

## 2012-07-02 NOTE — Progress Notes (Signed)
Patient ID: [name], female   DOB: 1960/08/18, 52 y.o.   MRN: [id number] Subjective:     [name] is a 52 y.o. female here for a routine exam.  Current complaints: vasomotor sx.  Pt reports    Gynecologic History Patient's last menstrual period was 03/27/2011. Contraception: menopause Last Pap: 04/2011. Results were: normal Last mammogram: 2014. Results were: normal per pt  Obstetric History OB History   Grav Para Term Preterm Abortions TAB SAB Ect Mult Living   4 2 2  2 2    2      # Outc Date GA Lbr Len/2nd Wgt Sex Del Anes PTL Lv   1 TRM 1989 108w0d   F SVD      2 TRM 1999 [redacted]w[redacted]d   M SVD      3 TAB            4 TAB                The following portions of the patient's history were reviewed and updated as appropriate: allergies, current medications, past family history, past medical history, past social history, past surgical history and problem list.  Review of Systems Pertinent items are noted in HPI.    Objective:    BP 101/72  Pulse 76  Temp(Src) 98 F (36.7 C) (Oral)  Ht 6' (1.829 m)  Wt 137 lb 3.2 oz (62.234 kg)  BMI 18.6 kg/m2  LMP 03/27/2011  General Appearance:    Alert, cooperative, no distress, appears stated age                 Neck:   Supple, symmetrical, trachea midline, no adenopathy;    thyroid:  no enlargement/tenderness/nodules; no carotid   bruit or JVD  Back:     Symmetric, no curvature, ROM normal, no CVA tenderness  Lungs:     Clear to auscultation bilaterally, respirations unlabored  Chest Wall:    No tenderness or deformity   Heart:    Regular rate and rhythm, S1 and S2 normal, no murmur, rub   or gallop  Breast Exam:    No tenderness, masses, or nipple abnormality  Abdomen:     Soft, non-tender, bowel sounds active all four quadrants,    no masses, no organomegaly  Genitalia:    Normal female without lesion, discharge or tenderness: EGBUS: no lesions noted; cervix: no CMT      Extremities:   Extremities normal,  atraumatic, no cyanosis or edema  Pulses:   2+ and symmetric all extremities  Skin:   Skin color, texture, turgor normal, no rashes or lesions  Lymph nodes:   Cervical, supraclavicular, and axillary nodes normal         Assessment:    Healthy female exam.    Plan:    Follow up in: 1 year.  for annual Keep Megace Paxil 10mg  q day F/u 3 months to recheck menopausal state

## 2012-07-16 ENCOUNTER — Other Ambulatory Visit: Payer: Self-pay | Admitting: Licensed Clinical Social Worker

## 2012-07-16 DIAGNOSIS — B20 Human immunodeficiency virus [HIV] disease: Secondary | ICD-10-CM

## 2012-07-16 MED ORDER — MEGESTROL ACETATE 625 MG/5ML PO SUSP: 625.0000 mg | Freq: Every day | ORAL | Status: DC

## 2012-08-18 ENCOUNTER — Other Ambulatory Visit: Payer: Self-pay | Admitting: Licensed Clinical Social Worker

## 2012-08-18 DIAGNOSIS — B20 Human immunodeficiency virus [HIV] disease: Secondary | ICD-10-CM

## 2012-08-18 MED ORDER — ENSURE PO LIQD: 1.0000 | Freq: Two times a day (BID) | ORAL | Status: DC

## 2012-10-23 ENCOUNTER — Other Ambulatory Visit: Payer: Self-pay | Admitting: *Deleted

## 2012-10-23 DIAGNOSIS — B20 Human immunodeficiency virus [HIV] disease: Secondary | ICD-10-CM

## 2012-10-23 MED ORDER — EFAVIRENZ-EMTRICITAB-TENOFOVIR 600-200-300 MG PO TABS: 1.0000 | ORAL_TABLET | Freq: Every day | ORAL | Status: DC

## 2012-12-15 ENCOUNTER — Other Ambulatory Visit (INDEPENDENT_AMBULATORY_CARE_PROVIDER_SITE_OTHER): Payer: Self-pay

## 2012-12-15 DIAGNOSIS — B20 Human immunodeficiency virus [HIV] disease: Secondary | ICD-10-CM

## 2012-12-15 DIAGNOSIS — Z113 Encounter for screening for infections with a predominantly sexual mode of transmission: Secondary | ICD-10-CM

## 2012-12-15 LAB — COMPREHENSIVE METABOLIC PANEL
ALT: 19 U/L (ref 0–35)
AST: 26 U/L (ref 0–37)
Albumin: 4.2 g/dL (ref 3.5–5.2)
Alkaline Phosphatase: 141 U/L — ABNORMAL HIGH (ref 39–117)
CO2: 27 mEq/L (ref 19–32)
Calcium: 9.6 mg/dL (ref 8.4–10.5)
Chloride: 105 mEq/L (ref 96–112)
Creat: 0.97 mg/dL (ref 0.50–1.10)
Potassium: 4.1 mEq/L (ref 3.5–5.3)
Total Protein: 7.9 g/dL (ref 6.0–8.3)

## 2012-12-15 LAB — CBC
MCHC: 33.2 g/dL (ref 30.0–36.0)
MCV: 88.6 fL (ref 78.0–100.0)
Platelets: 231 10*3/uL (ref 150–400)
RDW: 13.3 % (ref 11.5–15.5)
WBC: 4.2 10*3/uL (ref 4.0–10.5)

## 2012-12-16 LAB — HIV-1 RNA QUANT-NO REFLEX-BLD
HIV 1 RNA Quant: <20 copies/mL (ref <20)
HIV-1 RNA Quant, Log: <1.3 {Log} (ref <1.30)

## 2012-12-22 ENCOUNTER — Telehealth: Payer: Self-pay | Admitting: *Deleted

## 2012-12-22 NOTE — Telephone Encounter (Signed)
Drenda Freeze from [employer] called re: refill on Paxil. Marland Kitchen  Per chart review was ordered Paxil by Dr. Burnice Logan Katrinka Blazing for menopausal symptoms. Plan come back in 3 months to review menopausal sympoms.  Has appointment 01/05/13.  Will send to Dr. Burnice LoganKatrinka Blazing for review and approval or denial.

## 2012-12-23 ENCOUNTER — Ambulatory Visit: Payer: Self-pay | Attending: Internal Medicine

## 2012-12-24 ENCOUNTER — Other Ambulatory Visit: Payer: Self-pay | Admitting: Obstetrics & Gynecology

## 2012-12-24 ENCOUNTER — Ambulatory Visit: Payer: Self-pay | Admitting: Infectious Diseases

## 2012-12-24 DIAGNOSIS — B20 Human immunodeficiency virus [HIV] disease: Secondary | ICD-10-CM

## 2012-12-24 DIAGNOSIS — R232 Flushing: Secondary | ICD-10-CM

## 2012-12-24 MED ORDER — MEGESTROL ACETATE 20 MG PO TABS: 40.0000 mg | ORAL_TABLET | Freq: Every day | ORAL | Status: DC

## 2012-12-24 MED ORDER — PAROXETINE HCL 10 MG PO TABS: 20.0000 mg | ORAL_TABLET | ORAL | Status: DC

## 2012-12-24 NOTE — Progress Notes (Signed)
Pt walked in early.  Wanted refill for for her meds.  Wants an increase in the Paxil for vasomotro sx.  Will f/u in 3 months  Latrell Reitan L. Harraway-Smith, M.D., Evern Core

## 2012-12-25 NOTE — Telephone Encounter (Signed)
Dr. Erin Fulling spoke with pt on 12/24/12 and prescribed pt medication.

## 2012-12-31 ENCOUNTER — Telehealth: Payer: Self-pay | Admitting: *Deleted

## 2012-12-31 ENCOUNTER — Encounter: Payer: Self-pay | Admitting: Infectious Diseases

## 2012-12-31 ENCOUNTER — Ambulatory Visit (INDEPENDENT_AMBULATORY_CARE_PROVIDER_SITE_OTHER): Payer: Self-pay | Admitting: Infectious Diseases

## 2012-12-31 ENCOUNTER — Other Ambulatory Visit: Payer: Self-pay | Admitting: Obstetrics & Gynecology

## 2012-12-31 VITALS — BP 132/83 | HR 79 | Temp 97.8°F | Ht 72.0 in | Wt 137.0 lb

## 2012-12-31 DIAGNOSIS — N63 Unspecified lump in unspecified breast: Secondary | ICD-10-CM

## 2012-12-31 DIAGNOSIS — R232 Flushing: Secondary | ICD-10-CM

## 2012-12-31 DIAGNOSIS — F17200 Nicotine dependence, unspecified, uncomplicated: Secondary | ICD-10-CM

## 2012-12-31 DIAGNOSIS — Z23 Encounter for immunization: Secondary | ICD-10-CM

## 2012-12-31 DIAGNOSIS — Z1231 Encounter for screening mammogram for malignant neoplasm of breast: Secondary | ICD-10-CM

## 2012-12-31 DIAGNOSIS — B20 Human immunodeficiency virus [HIV] disease: Secondary | ICD-10-CM

## 2012-12-31 DIAGNOSIS — N938 Other specified abnormal uterine and vaginal bleeding: Secondary | ICD-10-CM

## 2012-12-31 DIAGNOSIS — N949 Unspecified condition associated with female genital organs and menstrual cycle: Secondary | ICD-10-CM

## 2012-12-31 DIAGNOSIS — B1710 Acute hepatitis C without hepatic coma: Secondary | ICD-10-CM

## 2012-12-31 DIAGNOSIS — Z113 Encounter for screening for infections with a predominantly sexual mode of transmission: Secondary | ICD-10-CM

## 2012-12-31 DIAGNOSIS — Z79899 Other long term (current) drug therapy: Secondary | ICD-10-CM

## 2012-12-31 MED ORDER — PAROXETINE HCL 10 MG PO TABS: 20.0000 mg | ORAL_TABLET | ORAL | Status: DC

## 2012-12-31 MED ORDER — NICOTINE 21 MG/24HR TD PT24: 21.0000 mg | MEDICATED_PATCH | Freq: Every day | TRANSDERMAL | Status: DC

## 2012-12-31 NOTE — Progress Notes (Signed)
  Subjective:    Patient ID: [name], female    DOB: 07-21-1960, 52 y.o.   MRN: [id number]  HPI 52 yo F with a Hx of HIV+, Hep C, tobacco use. Has been on atripla (for convenience) since 2007 after having been successfully maintained on NFV for many years.  Last PAP May 2014 (nl).  Missing some doses of meds (weekly). Her engagement fell through. Having stress from her son, has been smoking more.   HIV 1 RNA Quant (copies/mL)  Date Value  12/15/2012 <20   05/21/2012 <20   11/14/2011 <20      CD4 T Cell Abs (/uL)  Date Value  12/15/2012 620   05/21/2012 530   11/14/2011 580      Review of Systems  Constitutional: Positive for appetite change. Negative for unexpected weight change.  Gastrointestinal: Positive for constipation. Negative for diarrhea.  Genitourinary: Negative for difficulty urinating and menstrual problem.       Ammenrrheic x 3 yrs. Having hot flashes.        Objective:   Physical Exam  Constitutional: She appears well-developed and well-nourished.  HENT:  Mouth/Throat: No oropharyngeal exudate.  Eyes: EOM are normal. Pupils are equal, round, and reactive to light.  Neck: Neck supple.  Cardiovascular: Normal rate, regular rhythm and normal heart sounds.   Pulmonary/Chest: Effort normal and breath sounds normal.  Abdominal: Soft. Bowel sounds are normal. She exhibits no distension. There is no tenderness.  Lymphadenopathy:    She has no cervical adenopathy.          Assessment & Plan:

## 2012-12-31 NOTE — Assessment & Plan Note (Signed)
Normal mammo 12-2011. Will repeat.

## 2012-12-31 NOTE — Assessment & Plan Note (Signed)
Appreciate GYN f/u.  

## 2012-12-31 NOTE — Assessment & Plan Note (Signed)
She is doing well. Has had PAP, gets flu shot today. Getting GYN f/u for hot flashes. Asked her to increase H2O intake for her constipation, try OTC fiber (metamucil). Given condoms. Will see her back in 6 months.

## 2012-12-31 NOTE — Telephone Encounter (Signed)
Received inbox message from ID nurse patient wanted paxil changed to 60 tabs instead of 30 so that it would last one month. Discussed with Dr. Erin Fulling and she ordered new refill to 20mg  daily with 30 tabs instead of 10mg  - take 2 daily , 30 tabs. Called [facility] and left a message we have sent a new refill to her pharmacy- call if questions.

## 2012-12-31 NOTE — Assessment & Plan Note (Signed)
Continue to follow her LFTS

## 2012-12-31 NOTE — Assessment & Plan Note (Signed)
Will write her for nicotine patches.

## 2013-01-05 ENCOUNTER — Ambulatory Visit: Payer: Self-pay | Admitting: Obstetrics & Gynecology

## 2013-02-27 ENCOUNTER — Telehealth: Payer: Self-pay | Admitting: *Deleted

## 2013-02-27 NOTE — Telephone Encounter (Signed)
Left patient message asking her to call back.  She needs to fill out mammogram scholarship paperwork to have her mammogram done at [facility]. Landis Gandy, RN

## 2013-03-27 ENCOUNTER — Other Ambulatory Visit: Payer: Self-pay | Admitting: Infectious Diseases

## 2013-03-27 ENCOUNTER — Telehealth: Payer: Self-pay | Admitting: *Deleted

## 2013-03-27 DIAGNOSIS — Z1231 Encounter for screening mammogram for malignant neoplasm of breast: Secondary | ICD-10-CM

## 2013-03-27 NOTE — Telephone Encounter (Signed)
Mammogram scholarship application faxed to [phone]. Landis Gandy, RN

## 2013-04-03 ENCOUNTER — Ambulatory Visit (HOSPITAL_COMMUNITY)
Admission: RE | Admit: 2013-04-03 | Discharge: 2013-04-03 | Disposition: A | Discharge status: Home / Self Care | Payer: 59 | Source: Ambulatory Visit | Attending: Infectious Diseases | Admitting: Infectious Diseases

## 2013-04-03 DIAGNOSIS — Z1231 Encounter for screening mammogram for malignant neoplasm of breast: Secondary | ICD-10-CM

## 2013-04-17 ENCOUNTER — Encounter: Payer: Self-pay | Admitting: *Deleted

## 2013-04-23 ENCOUNTER — Other Ambulatory Visit: Payer: Self-pay | Admitting: Obstetrics & Gynecology

## 2013-05-04 ENCOUNTER — Emergency Department (HOSPITAL_COMMUNITY)
Admission: EM | Admit: 2013-05-04 | Discharge: 2013-05-05 | Disposition: A | Discharge status: Home / Self Care | Payer: 59 | Attending: Emergency Medicine | Admitting: Emergency Medicine

## 2013-05-04 ENCOUNTER — Encounter (HOSPITAL_COMMUNITY): Payer: Self-pay | Admitting: Emergency Medicine

## 2013-05-04 DIAGNOSIS — F17200 Nicotine dependence, unspecified, uncomplicated: Secondary | ICD-10-CM | POA: Insufficient documentation

## 2013-05-04 DIAGNOSIS — IMO0001 Reserved for inherently not codable concepts without codable children: Secondary | ICD-10-CM | POA: Insufficient documentation

## 2013-05-04 DIAGNOSIS — Z8619 Personal history of other infectious and parasitic diseases: Secondary | ICD-10-CM | POA: Insufficient documentation

## 2013-05-04 DIAGNOSIS — Z21 Asymptomatic human immunodeficiency virus [HIV] infection status: Secondary | ICD-10-CM | POA: Insufficient documentation

## 2013-05-04 DIAGNOSIS — Y9389 Activity, other specified: Secondary | ICD-10-CM | POA: Insufficient documentation

## 2013-05-04 DIAGNOSIS — S1096XA Insect bite of unspecified part of neck, initial encounter: Secondary | ICD-10-CM | POA: Insufficient documentation

## 2013-05-04 DIAGNOSIS — Z79899 Other long term (current) drug therapy: Secondary | ICD-10-CM | POA: Insufficient documentation

## 2013-05-04 DIAGNOSIS — S40269A Insect bite (nonvenomous) of unspecified shoulder, initial encounter: Secondary | ICD-10-CM | POA: Insufficient documentation

## 2013-05-04 DIAGNOSIS — J45909 Unspecified asthma, uncomplicated: Secondary | ICD-10-CM | POA: Insufficient documentation

## 2013-05-04 DIAGNOSIS — Y92009 Unspecified place in unspecified non-institutional (private) residence as the place of occurrence of the external cause: Secondary | ICD-10-CM | POA: Insufficient documentation

## 2013-05-04 DIAGNOSIS — W57XXXA Bitten or stung by nonvenomous insect and other nonvenomous arthropods, initial encounter: Secondary | ICD-10-CM | POA: Insufficient documentation

## 2013-05-04 NOTE — ED Notes (Signed)
Pt. reports bed bug bites at arms and neck onset 2 days ago , pt. staed her house was treated by exterminator  from bed bugs .

## 2013-05-04 NOTE — ED Notes (Signed)
Pt states that she has had her house treated twice for bed bugs. Pt states that she is the only one in the house who has the bites. Pt states that she has been using different soap than normal. Pt states that bumps that she has started afterwards.

## 2013-05-05 MED ORDER — HYDROCORTISONE 1 % EX CREA: TOPICAL_CREAM | CUTANEOUS | Status: DC

## 2013-05-05 MED ORDER — HYDROXYZINE HCL 10 MG PO TABS: 10.0000 mg | ORAL_TABLET | Freq: Four times a day (QID) | ORAL | Status: DC | PRN

## 2013-05-05 NOTE — ED Provider Notes (Signed)
CSN: [id number]     Arrival date & time 05/04/13  2224 History   First MD Initiated Contact with Patient 05/04/13 2347     Chief Complaint  Patient presents with  . Insect Bite     (Consider location/radiation/quality/duration/timing/severity/associated sxs/prior Treatment) Patient is a 53 y.o. female presenting with rash. The history is provided by the patient. No language interpreter was used.  Rash Location:  Head/neck, shoulder/arm and torso Head/neck rash location:  L neck and R ear Shoulder/arm rash location:  R wrist and R forearm Torso rash location:  L flank Quality: itchiness and redness   Severity:  Moderate Onset quality:  Gradual Duration:  2 days Timing:  Intermittent Progression:  Waxing and waning Chronicity:  New Context: insect bite/sting   Relieved by:  None tried Associated symptoms: no shortness of breath, no throat swelling and not wheezing     Past Medical History  Diagnosis Date  . HIV (human immunodeficiency virus infection)   . Asthma   . Hepatitis C    History reviewed. No pertinent past surgical history. Family History  Problem Relation Age of Onset  . Cancer Mother     bone  . Diabetes Sister    History  Substance Use Topics  . Smoking status: Current Every Day Smoker -- 0.50 packs/day for 33 years    Types: Cigarettes  . Smokeless tobacco: Never Used     Comment: would like to try patches  . Alcohol Use: No     Comment: recovered alcoholic for 10 years   OB History   Grav Para Term Preterm Abortions TAB SAB Ect Mult Living   4 2 2  2 2    2      Review of Systems  Respiratory: Negative for shortness of breath and wheezing.   Skin: Positive for rash.  All other systems reviewed and are negative.      Allergies  Aspirin  Home Medications   Current Outpatient Rx  Name  Route  Sig  Dispense  Refill  . albuterol (PROVENTIL HFA;VENTOLIN HFA) 108 (90 BASE) MCG/ACT inhaler   Inhalation   Inhale 2 puffs into the lungs every  6 (six) hours as needed for wheezing.   2 Inhaler   6   . efavirenz-emtricitabine-tenofovir (ATRIPLA) 600-200-300 MG per tablet   Oral   Take 1 tablet by mouth at bedtime.   30 tablet   5   . ENSURE (ENSURE)   Oral   Take 1 Can by mouth 2 (two) times daily between meals.   237 mL   11   . megestrol (MEGACE) 20 MG tablet   Oral   Take 2 tablets (40 mg total) by mouth daily.   30 tablet   4   . nicotine (NICODERM CQ) 21 mg/24hr patch   Transdermal   Place 1 patch (21 mg total) onto the skin daily.   28 patch   0   . PARoxetine (PAXIL) 10 MG tablet   Oral   Take 2 tablets (20 mg total) by mouth every morning.   60 tablet   6    BP 130/83  Pulse 87  Temp(Src) 97.8 F (36.6 C) (Oral)  Resp 20  Ht 6' (1.829 m)  Wt 130 lb (58.968 kg)  BMI 17.63 kg/m2  SpO2 99%  LMP 03/27/2011 Physical Exam  Vitals reviewed. Constitutional: She is oriented to person, place, and time. She appears well-developed and well-nourished.  HENT:  Head: Normocephalic.  Eyes: Pupils are equal,  round, and reactive to light.  Neck: Normal range of motion.  Cardiovascular: Normal rate and regular rhythm.   Pulmonary/Chest: Effort normal and breath sounds normal.  Abdominal: Soft. Bowel sounds are normal.  Musculoskeletal: She exhibits no edema and no tenderness.  Neurological: She is alert and oriented to person, place, and time.  Skin: Skin is warm and dry. Rash noted.  Pruritic rash with urticarial appearance of lesions to neck and torso.  Psychiatric: She has a normal mood and affect. Her behavior is normal. Judgment and thought content normal.    ED Course  Procedures (including critical care time) Labs Review Labs Reviewed - No data to display Imaging Review No results found.   EKG Interpretation None      MDM   Final diagnoses:  None    Insect bites.  House recently treated for bed bugs.  Localized skin reaction, denies shortness of breath, difficulty  swallowing.    Norman Herrlich, NP 05/05/13 [phone]

## 2013-05-05 NOTE — ED Provider Notes (Signed)
Medical screening examination/treatment/procedure(s) were performed by non-physician practitioner and as supervising physician I was immediately available for consultation/collaboration.   EKG Interpretation None        Wynetta Fines, MD 05/05/13 [phone]

## 2013-05-05 NOTE — Discharge Instructions (Signed)
Insect Bite °Mosquitoes, flies, fleas, bedbugs, and other insects can bite. Insect bites are different from insect stings. The bite may be red, puffy (swollen), and itchy for 2 to 4 days. Most bites get better on their own. °HOME CARE  °· Do not scratch the bite. °· Keep the bite clean and dry. Wash the bite with soap and water. °· Put ice on the bite. °· Put ice in a plastic bag. °· Place a towel between your skin and the bag. °· Leave the ice on for 20 minutes, 4 times a day. Do this for the first 2 to 3 days, or as told by your doctor. °· You may use medicated lotions or creams to lessen itching as told by your doctor. °· Only take medicines as told by your doctor. °· If you are given medicines (antibiotics), take them as told. Finish them even if you start to feel better. °You may need a tetanus shot if: °· You cannot remember when you had your last tetanus shot. °· You have never had a tetanus shot. °· The injury broke your skin. °If you need a tetanus shot and you choose not to have one, you may get tetanus. Sickness from tetanus can be serious. °GET HELP RIGHT AWAY IF:  °· You have more pain, redness, or puffiness. °· You see a red line on the skin coming from the bite. °· You have a fever. °· You have joint pain. °· You have a headache or neck pain. °· You feel weak. °· You have a rash. °· You have chest pain, or you are short of breath. °· You have belly (abdominal) pain. °· You feel sick to your stomach (nauseous) or throw up (vomit). °· You feel very tired or sleepy. °MAKE SURE YOU:  °· Understand these instructions. °· Will watch your condition. °· Will get help right away if you are not doing well or get worse. °Document Released: 01/27/2000 Document Revised: 04/23/2011 Document Reviewed: 08/30/2010 °ExitCare® Patient Information ©2014 [employer]. ° °

## 2013-05-15 ENCOUNTER — Other Ambulatory Visit: Payer: Self-pay | Admitting: Infectious Diseases

## 2013-06-11 ENCOUNTER — Other Ambulatory Visit: Payer: 59

## 2013-06-11 DIAGNOSIS — Z113 Encounter for screening for infections with a predominantly sexual mode of transmission: Secondary | ICD-10-CM

## 2013-06-11 DIAGNOSIS — Z79899 Other long term (current) drug therapy: Secondary | ICD-10-CM

## 2013-06-11 DIAGNOSIS — B20 Human immunodeficiency virus [HIV] disease: Secondary | ICD-10-CM

## 2013-06-11 LAB — COMPLETE METABOLIC PANEL WITH GFR
ALT: 22 U/L (ref 0–35)
AST: 28 U/L (ref 0–37)
Albumin: 4.3 g/dL (ref 3.5–5.2)
Alkaline Phosphatase: 122 U/L — ABNORMAL HIGH (ref 39–117)
BUN: 13 mg/dL (ref 6–23)
CO2: 27 mEq/L (ref 19–32)
Calcium: 9.5 mg/dL (ref 8.4–10.5)
Chloride: 105 mEq/L (ref 96–112)
Creat: 1.03 mg/dL (ref 0.50–1.10)
GFR, Est African American: 72 mL/min
GFR, Est Non African American: 63 mL/min
Glucose, Bld: 93 mg/dL (ref 70–99)
Potassium: 4.7 mEq/L (ref 3.5–5.3)
Sodium: 139 mEq/L (ref 135–145)
Total Bilirubin: 0.5 mg/dL (ref 0.2–1.2)
Total Protein: 7.6 g/dL (ref 6.0–8.3)

## 2013-06-11 LAB — CBC WITH DIFFERENTIAL/PLATELET
Basophils Absolute: 0 10*3/uL (ref 0.0–0.1)
Basophils Relative: 0 % (ref 0–1)
Eosinophils Absolute: 0.1 10*3/uL (ref 0.0–0.7)
Eosinophils Relative: 2 % (ref 0–5)
HCT: 37.9 % (ref 36.0–46.0)
Hemoglobin: 12.5 g/dL (ref 12.0–15.0)
Lymphocytes Relative: 45 % (ref 12–46)
Lymphs Abs: 2 10*3/uL (ref 0.7–4.0)
MCH: 30.1 pg (ref 26.0–34.0)
MCHC: 33 g/dL (ref 30.0–36.0)
MCV: 91.3 fL (ref 78.0–100.0)
Monocytes Absolute: 0.4 10*3/uL (ref 0.1–1.0)
Monocytes Relative: 9 % (ref 3–12)
Neutro Abs: 1.9 10*3/uL (ref 1.7–7.7)
Neutrophils Relative %: 44 % (ref 43–77)
Platelets: 217 10*3/uL (ref 150–400)
RBC: 4.15 MIL/uL (ref 3.87–5.11)
RDW: 12.9 % (ref 11.5–15.5)
WBC: 4.4 10*3/uL (ref 4.0–10.5)

## 2013-06-11 LAB — LIPID PANEL
Cholesterol: 144 mg/dL (ref 0–200)
HDL: 65 mg/dL (ref >39)
LDL Cholesterol: 65 mg/dL (ref 0–99)
Total CHOL/HDL Ratio: 2.2 Ratio
Triglycerides: 71 mg/dL (ref <150)
VLDL: 14 mg/dL (ref 0–40)

## 2013-06-12 LAB — T-HELPER CELL (CD4) - (RCID CLINIC ONLY)
CD4 % Helper T Cell: 31 % — ABNORMAL LOW (ref 33–55)
CD4 T Cell Abs: 580 /uL (ref 400–2700)

## 2013-06-12 LAB — RPR TITER: RPR Titer: 1:4 {titer}

## 2013-06-12 LAB — HIV-1 RNA QUANT-NO REFLEX-BLD
HIV 1 RNA Quant: <20 copies/mL (ref <20)
HIV-1 RNA Quant, Log: <1.3 {Log} (ref <1.30)

## 2013-06-12 LAB — T.PALLIDUM AB, TOTAL: T pallidum Antibodies (TP-PA): 0.15 S/CO (ref <0.90)

## 2013-06-12 LAB — RPR: RPR Ser Ql: REACTIVE — AB

## 2013-06-22 ENCOUNTER — Emergency Department (INDEPENDENT_AMBULATORY_CARE_PROVIDER_SITE_OTHER)
Admission: EM | Admit: 2013-06-22 | Discharge: 2013-06-22 | Disposition: A | Discharge status: Home / Self Care | Payer: 59 | Source: Home / Self Care | Attending: Family Medicine | Admitting: Family Medicine

## 2013-06-22 ENCOUNTER — Emergency Department (INDEPENDENT_AMBULATORY_CARE_PROVIDER_SITE_OTHER): Payer: 59

## 2013-06-22 ENCOUNTER — Encounter (HOSPITAL_COMMUNITY): Payer: Self-pay | Admitting: Emergency Medicine

## 2013-06-22 DIAGNOSIS — J45909 Unspecified asthma, uncomplicated: Secondary | ICD-10-CM

## 2013-06-22 MED ORDER — METHYLPREDNISOLONE 4 MG PO KIT: PACK | ORAL | Status: DC

## 2013-06-22 MED ORDER — IPRATROPIUM-ALBUTEROL 0.5-2.5 (3) MG/3ML IN SOLN
RESPIRATORY_TRACT | Status: AC
  Filled 2013-06-22: qty 3

## 2013-06-22 MED ORDER — ALBUTEROL SULFATE HFA 108 (90 BASE) MCG/ACT IN AERS: 2.0000 | INHALATION_SPRAY | RESPIRATORY_TRACT | Status: DC | PRN

## 2013-06-22 MED ORDER — FEXOFENADINE-PSEUDOEPHED ER 60-120 MG PO TB12: 1.0000 | ORAL_TABLET | Freq: Two times a day (BID) | ORAL | Status: DC

## 2013-06-22 MED ORDER — OLOPATADINE HCL 0.2 % OP SOLN: OPHTHALMIC | Status: DC

## 2013-06-22 MED ORDER — FLUTICASONE PROPIONATE 50 MCG/ACT NA SUSP: 2.0000 | Freq: Two times a day (BID) | NASAL | Status: DC

## 2013-06-22 MED ORDER — IPRATROPIUM-ALBUTEROL 0.5-2.5 (3) MG/3ML IN SOLN
3.0000 mL | Freq: Once | RESPIRATORY_TRACT | Status: AC
  Administered 2013-06-22: 3 mL via RESPIRATORY_TRACT

## 2013-06-22 NOTE — ED Notes (Signed)
C/o  Productive cough with yellow sputum.  Cough worse at night, not able to sleep.  States "feels like throat is closing".   Sob.   otc allergy meds tried with no relief in symptoms.  Denies fever, n/v/d

## 2013-06-22 NOTE — ED Provider Notes (Signed)
CSN: [id number]     Arrival date & time 06/22/13  1056 History   First MD Initiated Contact with Patient 06/22/13 1209     Chief Complaint  Patient presents with  . Asthma  . Cough   (Consider location/radiation/quality/duration/timing/severity/associated sxs/prior Treatment) HPI Comments: 53 year old female with history of HIV, hepatitis C, pneumonia, presents complaining of allergies and asthma. She has a history of asthma but has not had an attack and so long that she is not even keep an inhaler at home anymore. She was out working in the yard a lot the last couple of days and since then she has started to feel wheezing, chest tightness, shortness of breath, itchy watery eyes, nasal congestion, sneezing. The shortness of breath is worse when lying flat and worse with coughing. She does have some shortness of breath when she is not actually coughing. She has no chest pain at this time. She has taken Claritin and other cough and cold medications without much relief of her symptoms. She denies fever, chills, NVD. No recent travel or sick contacts.  Patient is a 53 y.o. female presenting with asthma and cough.  Asthma Associated symptoms include shortness of breath. Pertinent negatives include no chest pain.  Cough Associated symptoms: eye discharge, rhinorrhea and shortness of breath   Associated symptoms: no chest pain, no chills, no ear pain, no fever, no rash and no sore throat     Past Medical History  Diagnosis Date  . HIV (human immunodeficiency virus infection)   . Asthma   . Hepatitis C    History reviewed. No pertinent past surgical history. Family History  Problem Relation Age of Onset  . Cancer Mother     bone  . Diabetes Sister    History  Substance Use Topics  . Smoking status: Current Every Day Smoker -- 0.50 packs/day for 33 years    Types: Cigarettes  . Smokeless tobacco: Never Used     Comment: would like to try patches  . Alcohol Use: No     Comment: recovered  alcoholic for 10 years   OB History   Grav Para Term Preterm Abortions TAB SAB Ect Mult Living   4 2 2  2 2    2      Review of Systems  Constitutional: Negative for fever, chills and fatigue.  HENT: Positive for congestion, postnasal drip, rhinorrhea and sneezing. Negative for ear discharge, ear pain, sinus pressure and sore throat.        Subjective Throat swelling  Eyes: Positive for discharge and itching.  Respiratory: Positive for cough, chest tightness and shortness of breath.   Cardiovascular: Negative for chest pain.  Gastrointestinal: Negative for nausea, vomiting and diarrhea.  Skin: Negative for rash.  All other systems reviewed and are negative.   Allergies  Aspirin  Home Medications   Prior to Admission medications   Medication Sig Start Date End Date Taking? Authorizing Provider  ATRIPLA [phone] MG per tablet TAKE 1 TABLET BY MOUTH EVERY NIGHT AT BEDTIME   Yes Campbell Riches, MD  PARoxetine (PAXIL) 10 MG tablet Take 2 tablets (20 mg total) by mouth every morning. 12/31/12  Yes Lavonia Drafts, MD  albuterol (PROVENTIL HFA;VENTOLIN HFA) 108 (90 BASE) MCG/ACT inhaler Inhale 2 puffs into the lungs every 6 (six) hours as needed for wheezing. 01/24/11   Campbell Riches, MD  ENSURE (ENSURE) Take 1 Can by mouth 2 (two) times daily between meals. 08/18/12   Campbell Riches, MD  hydrocortisone  cream 1 % Apply to affected area 2 times daily 05/05/13   Norman Herrlich, NP  hydrOXYzine (ATARAX/VISTARIL) 10 MG tablet Take 1 tablet (10 mg total) by mouth every 6 (six) hours as needed for itching. 05/05/13   Norman Herrlich, NP  megestrol (MEGACE) 20 MG tablet Take 2 tablets (40 mg total) by mouth daily. 12/24/12   Lavonia Drafts, MD  nicotine (NICODERM CQ) 21 mg/24hr patch Place 1 patch (21 mg total) onto the skin daily. 12/31/12   Campbell Riches, MD   BP 144/89  Pulse 90  Temp(Src) 98 F (36.7 C) (Oral)  Resp 16  SpO2 97%  LMP 03/27/2011 Physical  Exam  Nursing note and vitals reviewed. Constitutional: She is oriented to person, place, and time. Vital signs are normal. She appears well-developed and well-nourished. No distress.  HENT:  Head: Normocephalic and atraumatic.  Right Ear: External ear normal.  Nose: Nose normal.  Mouth/Throat: Oropharynx is clear and moist. No oropharyngeal exudate.  Eyes: EOM are normal. Right eye exhibits no discharge. Left eye exhibits no discharge.  Glassy conjunctiva  Neck: Normal range of motion. Neck supple.  Scar from previous tracheostomy from pneumonia 17 years ago  Cardiovascular: Normal rate, regular rhythm and normal heart sounds.   Pulmonary/Chest: Effort normal. No accessory muscle usage. Not tachypneic. No respiratory distress. She has wheezes in the right lower field. She has no rhonchi. She has no rales.  Lymphadenopathy:    She has no cervical adenopathy.  Neurological: She is alert and oriented to person, place, and time. She has normal strength. Coordination normal.  Skin: Skin is warm and dry. No rash noted. She is not diaphoretic.  Psychiatric: She has a normal mood and affect. Judgment normal.    ED Course  Procedures (including critical care time) Labs Review Labs Reviewed - No data to display  Imaging Review Dg Chest 2 View  06/22/2013   CLINICAL DATA:  Cough and asthma  EXAM: CHEST  2 VIEW  COMPARISON:  May 31, 2003  FINDINGS: There is a focal opacity in the right upper lobe which was present on the prior study and most likely represents scarring given the chronicity since that study. Elsewhere lungs are clear. Heart size and pulmonary vascularity are normal. No adenopathy. No bone lesions.  IMPRESSION: Stable opacity in the right upper lobe measuring 2.0 x 1.3 cm. The stability of this opacity is consistent with benign etiology, most likely representing old scar. Elsewhere lungs are clear.   Electronically Signed   By: Lowella Grip M.D.   On: 06/22/2013 13:00      MDM   1. Hay fever with asthma    X-ray reveals a stable scar in the lung, no acute pneumonia. Improvement with DuoNeb. Treating for hay fever allergies with bronchospasm. Followup if worsening despite treatment   Meds ordered this encounter  Medications  . ipratropium-albuterol (DUONEB) 0.5-2.5 (3) MG/3ML nebulizer solution 3 mL    Sig:   . fexofenadine-pseudoephedrine (ALLEGRA-D) 60-120 MG per tablet    Sig: Take 1 tablet by mouth every 12 (twelve) hours.    Dispense:  30 tablet    Refill:  0    Order Specific Question:  Supervising Provider    Answer:  Lynne Leader, [place]  . methylPREDNISolone (MEDROL DOSEPAK) 4 MG tablet    Sig: Use as directed on package instructions    Dispense:  21 tablet    Refill:  0    Order Specific Question:  Supervising  Provider    Answer:  Lynne Leader, [place]  . fluticasone (FLONASE) 50 MCG/ACT nasal spray    Sig: Place 2 sprays into both nostrils 2 (two) times daily. Decrease to 2 sprays/nostril daily after 5 days    Dispense:  16 g    Refill:  2    Order Specific Question:  Supervising Provider    Answer:  Lynne Leader, [place]  . Olopatadine HCl (PATADAY) 0.2 % SOLN    Sig: 1 drop per eye once daily as needed for redness, itching, or irritation    Dispense:  2.5 mL    Refill:  0    Order Specific Question:  Supervising Provider    Answer:  Lynne Leader, [place]  . albuterol (PROVENTIL HFA;VENTOLIN HFA) 108 (90 BASE) MCG/ACT inhaler    Sig: Inhale 2 puffs into the lungs every 4 (four) hours as needed for wheezing.    Dispense:  1 Inhaler    Refill:  0    Order Specific Question:  Supervising Provider    Answer:  Lynne Leader, [place]      Liam Graham, PA-C 06/22/13 1319

## 2013-06-24 ENCOUNTER — Encounter: Payer: Self-pay | Admitting: Infectious Diseases

## 2013-06-24 ENCOUNTER — Ambulatory Visit (INDEPENDENT_AMBULATORY_CARE_PROVIDER_SITE_OTHER): Payer: 59 | Admitting: Infectious Diseases

## 2013-06-24 VITALS — BP 146/87 | HR 88 | Temp 98.3°F | Ht 72.0 in | Wt 125.0 lb

## 2013-06-24 DIAGNOSIS — J45909 Unspecified asthma, uncomplicated: Secondary | ICD-10-CM

## 2013-06-24 DIAGNOSIS — B1710 Acute hepatitis C without hepatic coma: Secondary | ICD-10-CM

## 2013-06-24 DIAGNOSIS — B20 Human immunodeficiency virus [HIV] disease: Secondary | ICD-10-CM

## 2013-06-24 DIAGNOSIS — F17200 Nicotine dependence, unspecified, uncomplicated: Secondary | ICD-10-CM

## 2013-06-24 LAB — COMPREHENSIVE METABOLIC PANEL
ALBUMIN: 4.4 g/dL (ref 3.5–5.2)
ALK PHOS: 120 U/L — AB (ref 39–117)
ALT: 17 U/L (ref 0–35)
AST: 25 U/L (ref 0–37)
BUN: 10 mg/dL (ref 6–23)
CALCIUM: 9.5 mg/dL (ref 8.4–10.5)
CHLORIDE: 106 meq/L (ref 96–112)
CO2: 23 mEq/L (ref 19–32)
Creat: 0.95 mg/dL (ref 0.50–1.10)
Glucose, Bld: 99 mg/dL (ref 70–99)
POTASSIUM: 4.5 meq/L (ref 3.5–5.3)
Sodium: 138 mEq/L (ref 135–145)
Total Bilirubin: 0.4 mg/dL (ref 0.2–1.2)
Total Protein: 8 g/dL (ref 6.0–8.3)

## 2013-06-24 LAB — PROTIME-INR
INR: 0.96 (ref <1.50)
PROTHROMBIN TIME: 12.7 s (ref 11.6–15.2)

## 2013-06-24 LAB — IRON: IRON: 89 ug/dL (ref 42–145)

## 2013-06-24 NOTE — Assessment & Plan Note (Signed)
Encouraged to quit. 

## 2013-06-24 NOTE — Progress Notes (Signed)
   Subjective:    Patient ID: [name], female    DOB: 06-Oct-1960, 53 y.o.   MRN: [id number]  HPI 53 yo F with a Hx of HIV+, Hep C, tobacco use. Has been on atripla (for convenience) since 2007 after having been successfully maintained on NFV for many years.  Was seen recently for allergies/asthma in urgent care after applying hey to her yard. She was given allegra d, prednisone, albuterol inhaler, eye drops. Still coughing but breathing is better.  Still smoking.  Last PAP May 2014 (nl).   HIV 1 RNA Quant (copies/mL)  Date Value  06/11/2013 <20   12/15/2012 <20   05/21/2012 <20      CD4 T Cell Abs (/uL)  Date Value  06/11/2013 580   12/15/2012 620   05/21/2012 530     Review of Systems  Constitutional: Positive for appetite change and unexpected weight change. Negative for fever and chills.  Respiratory: Positive for cough.   Gastrointestinal: Negative for diarrhea and constipation.  Genitourinary: Negative for difficulty urinating and menstrual problem.       Ammenorrheic       Objective:   Physical Exam  Constitutional: She appears well-developed and well-nourished.  HENT:  Mouth/Throat: No oropharyngeal exudate.  Eyes: EOM are normal. Pupils are equal, round, and reactive to light.  Neck: Neck supple.  Cardiovascular: Normal rate, regular rhythm and normal heart sounds.   Pulmonary/Chest: Effort normal and breath sounds normal.  Abdominal: Soft. Bowel sounds are normal. She exhibits no distension. There is no tenderness.  Musculoskeletal: She exhibits no edema.  Lymphadenopathy:    She has no cervical adenopathy.          Assessment & Plan:

## 2013-06-24 NOTE — Assessment & Plan Note (Signed)
She is doing well. Some concern about wt loss (has family stress). Needs PNVX next year. Given condoms. Needs PAP. Will see her back in 6 months.

## 2013-06-24 NOTE — Addendum Note (Signed)
Addended by: Dolan Amen D on: 06/24/2013 09:58 AM   Modules accepted: Orders

## 2013-06-24 NOTE — Assessment & Plan Note (Signed)
Appears to be improving. Continue her previous rx's.

## 2013-06-24 NOTE — Assessment & Plan Note (Addendum)
Will see if we can get her started on harvoni. Check screening labs. See her back in 2 months.

## 2013-06-25 LAB — ANA: Anti Nuclear Antibody(ANA): NEGATIVE

## 2013-06-25 LAB — HCV RNA QUANT RFLX ULTRA OR GENOTYP
HCV Quantitative Log: 5.24 {Log} — ABNORMAL HIGH (ref <1.18)
HCV Quantitative: 174963 IU/mL — ABNORMAL HIGH (ref <15)

## 2013-06-25 NOTE — ED Provider Notes (Signed)
Medical screening examination/treatment/procedure(s) were performed by a resident physician or non-physician practitioner and as the supervising physician I was immediately available for consultation/collaboration.  Lynne Leader, MD    Gregor Hams, MD 06/25/13 [phone]

## 2013-06-26 ENCOUNTER — Ambulatory Visit (INDEPENDENT_AMBULATORY_CARE_PROVIDER_SITE_OTHER): Payer: 59 | Admitting: *Deleted

## 2013-06-26 DIAGNOSIS — Z124 Encounter for screening for malignant neoplasm of cervix: Secondary | ICD-10-CM

## 2013-06-26 LAB — HEPATITIS C GENOTYPE

## 2013-06-26 NOTE — Patient Instructions (Signed)
Your results will be ready in about a week.  I will mail them to you.  Thank you for coming to the [facility] for your care.  Denise,  RN 

## 2013-06-26 NOTE — Progress Notes (Signed)
  Subjective:     [name] is a 53 y.o. woman who comes in today for a  pap smear only.  Previous abnormal Pap smears: no.  Contraception: condoms  Objective:    LMP 03/27/2011 Pelvic Exam:  Pap smear obtained.   Assessment:    Screening pap smear.   Plan:    Follow up in year, or as indicated by Pap results.  Pt given educational materials re: HIV and women, self-esteem, BSE, nutrition and diet management, PAP smears and partner safety.  Pt given condoms.

## 2013-06-30 ENCOUNTER — Other Ambulatory Visit: Payer: Self-pay | Admitting: Infectious Diseases

## 2013-06-30 DIAGNOSIS — B1920 Unspecified viral hepatitis C without hepatic coma: Secondary | ICD-10-CM

## 2013-06-30 MED ORDER — LEDIPASVIR-SOFOSBUVIR 90-400 MG PO TABS: 1.0000 | ORAL_TABLET | Freq: Every day | ORAL | Status: DC

## 2013-07-01 ENCOUNTER — Encounter: Payer: Self-pay | Admitting: *Deleted

## 2013-07-08 ENCOUNTER — Telehealth: Payer: Self-pay | Admitting: *Deleted

## 2013-07-08 NOTE — Telephone Encounter (Signed)
Prior Authorization for [employer] received.  Fax given to [facility] pharmacist to process per Dr. Johnnye Sima.

## 2013-07-27 ENCOUNTER — Telehealth: Payer: Self-pay | Admitting: *Deleted

## 2013-07-27 NOTE — Telephone Encounter (Signed)
Received Denial letter from [employer].  placed fax in M. Pham, pharmacist desk drawer to process for appeal or patient assistance.

## 2013-08-11 ENCOUNTER — Telehealth: Payer: Self-pay | Admitting: *Deleted

## 2013-08-11 NOTE — Telephone Encounter (Signed)
Called patient (3x) to have her come and sign forms for PAP on Harvoni and the numbers listed in the chart are not valid.

## 2013-08-24 ENCOUNTER — Ambulatory Visit: Payer: 59 | Admitting: Infectious Diseases

## 2013-09-01 ENCOUNTER — Ambulatory Visit (INDEPENDENT_AMBULATORY_CARE_PROVIDER_SITE_OTHER): Payer: Self-pay | Admitting: Internal Medicine

## 2013-09-01 ENCOUNTER — Encounter: Payer: Self-pay | Admitting: Internal Medicine

## 2013-09-01 VITALS — BP 121/80 | HR 84 | Temp 98.4°F | Wt 128.0 lb

## 2013-09-01 DIAGNOSIS — T3995XA Adverse effect of unspecified nonopioid analgesic, antipyretic and antirheumatic, initial encounter: Secondary | ICD-10-CM

## 2013-09-01 DIAGNOSIS — G4440 Drug-induced headache, not elsewhere classified, not intractable: Secondary | ICD-10-CM

## 2013-09-01 MED ORDER — NAPROXEN 500 MG PO TABS: 500.0000 mg | ORAL_TABLET | Freq: Two times a day (BID) | ORAL | Status: DC

## 2013-09-01 MED ORDER — AMITRIPTYLINE HCL 25 MG PO TABS: 25.0000 mg | ORAL_TABLET | Freq: Every day | ORAL | Status: DC

## 2013-09-01 NOTE — Progress Notes (Signed)
Subjective:    Patient ID: [name], female    DOB: 1960/03/02, 53 y.o.   MRN: [id number]  HPI 53yo F with HIV-HCV, genotype 1a. CD 4 count /VL<20, on atripla great adherence, tolerates without sleep disturbance. She started to notice having a left submandibular swelling roughly 2 months ago. Slowly increasing in size, nontender, mobile. occaissional feels sharp pain from the neck lesion. Coincidentally having daily headache, mostly in the morning. Takes ibuprofen 400mg  to treat it. Takes it 4 x per week for hte past 2 months. + stressor, attributes it to sinus headache since has pain over maxilllary sinus. Ibuprofen helps. Also takes nyquil sinus that helps her sleep and ha. No difference in headache while taking coffee  Current Outpatient Prescriptions on File Prior to Visit  Medication Sig Dispense Refill  . albuterol (PROVENTIL HFA;VENTOLIN HFA) 108 (90 BASE) MCG/ACT inhaler Inhale 2 puffs into the lungs every 4 (four) hours as needed for wheezing.  1 Inhaler  0  . ATRIPLA 600-200-300 MG per tablet TAKE 1 TABLET BY MOUTH EVERY NIGHT AT BEDTIME  30 tablet  4  . ENSURE (ENSURE) Take 1 Can by mouth 2 (two) times daily between meals.  237 mL  11  . fexofenadine-pseudoephedrine (ALLEGRA-D) 60-120 MG per tablet Take 1 tablet by mouth every 12 (twelve) hours.  30 tablet  0  . hydrOXYzine (ATARAX/VISTARIL) 10 MG tablet Take 1 tablet (10 mg total) by mouth every 6 (six) hours as needed for itching.  20 tablet  0  . megestrol (MEGACE) 20 MG tablet Take 2 tablets (40 mg total) by mouth daily.  30 tablet  4  . PARoxetine (PAXIL) 10 MG tablet Take 2 tablets (20 mg total) by mouth every morning.  60 tablet  6  . fluticasone (FLONASE) 50 MCG/ACT nasal spray Place 2 sprays into both nostrils 2 (two) times daily. Decrease to 2 sprays/nostril daily after 5 days  16 g  2  . hydrocortisone cream 1 % Apply to affected area 2 times daily  15 g  0  . Ledipasvir-Sofosbuvir (HARVONI) 90-400 MG TABS Take 1  tablet by mouth daily.  30 tablet  3  . methylPREDNISolone (MEDROL DOSEPAK) 4 MG tablet Use as directed on package instructions  21 tablet  0  . nicotine (NICODERM CQ) 21 mg/24hr patch Place 1 patch (21 mg total) onto the skin daily.  28 patch  0  . Olopatadine HCl (PATADAY) 0.2 % SOLN 1 drop per eye once daily as needed for redness, itching, or irritation  2.5 mL  0   No current facility-administered medications on file prior to visit.   Active Ambulatory Problems    Diagnosis Date Noted  . HIV DISEASE 12/14/2005  . HEPATITIS C 12/14/2005  . 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 TOBACCO ABUSE 12/14/2005  . SUBSTANCE ABUSE 12/14/2005  . DEPRESSION 07/24/2006  . PERIPHERAL NEUROPATHY 11/07/2007  . GERD 12/14/2005  . CHOLELITHIASIS 12/15/2007  . LUMP OR MASS IN BREAST 11/20/2006  . DYSFUNCTIONAL UTERINE BLEEDING 04/03/2006  . HEADACHE 04/14/2008  . BROKEN TOOTH 07/23/2007  . PNEUMONIA, HX OF 12/14/2005  . COLPOSCOPY, HX OF 12/14/2005  . Asthma 01/23/2011   Resolved Ambulatory Problems    Diagnosis Date Noted  . Foot pain, right 12/05/2011   Past Medical History  Diagnosis Date  . HIV (human immunodeficiency virus infection)   . Asthma   . Hepatitis C     Soc hx:   Review of Systems  Constitutional: Negative for fever,  chills, diaphoresis, activity change, appetite change, fatigue and unexpected weight change.  HENT: Negative for congestion, sore throat, rhinorrhea, sneezing, trouble swallowing and sinus pressure.  Eyes: Negative for photophobia and visual disturbance.  Respiratory: Negative for cough, chest tightness, shortness of breath, wheezing and stridor.  Cardiovascular: Negative for chest pain, palpitations and leg swelling.  Gastrointestinal: Negative for nausea, vomiting, abdominal pain, diarrhea, constipation, blood in stool, abdominal distention and anal bleeding.  Genitourinary: Negative for dysuria, hematuria, flank pain and difficulty urinating.  Musculoskeletal:  Negative for myalgias, back pain, joint swelling, arthralgias and gait problem.  Skin: Negative for color change, pallor, rash and wound.  Neurological: + headaches. Negative for dizziness, tremors, weakness and light-headedness.  Hematological: Negative for adenopathy. Does not bruise/bleed easily.  Psychiatric/Behavioral: Negative for behavioral problems, confusion, sleep disturbance, dysphoric mood, decreased concentration and agitation.       Objective:   Physical Exam BP 121/80  Pulse 84  Temp(Src) 98.4 F (36.9 C) (Oral)  Wt 128 lb (58.06 kg)  LMP 03/27/2011 Physical Exam  Constitutional:  oriented to person, place, and time. appears well-developed and well-nourished. No distress.  HENT: very subtle lymphadenopathy, submandibular, anterior cervical chain nontender on left Mouth/Throat: Oropharynx is clear and moist. No oropharyngeal exudate.  Cardiovascular: Normal rate, regular rhythm and normal heart sounds. Exam reveals no gallop and no friction rub.  No murmur heard.  Pulmonary/Chest: Effort normal and breath sounds normal. No respiratory distress.  has no wheezes.  Abdominal: Soft. Bowel sounds are normal.  exhibits no distension. There is no tenderness.  Lymphadenopathy: no cervical adenopathy.  Neurological: alert and oriented to person, place, and time.  Skin: Skin is warm and dry. No rash noted. No erythema.  Psychiatric: a normal mood and affect. behavior is normal.         Assessment & Plan:  hiv = well  Controlled. Continue on atripla. Has done adap today  Lymphadenopathy = possibly due to viral infection. Not impressive on physical exam. Would like to keep in mind at next visit to see if need to do furhter imaging or resolves on its own.   Chronic daily ha = may have element on nsaid rebound headache? Will try to do scheduled naproxen 500mg  BID x 10 days, stop other ibuprofen. Avoid tylenol. Once adap kicks in, can consider to start amiltriptyline 12.5mg  x 7  day then increase to 25mg  daily. Will have her follow up with Dr. Johnnye Sima to see if this helps her.  Hepatitis c = we are re-applying under patient assistance since she has no insurance. Avoid tylenol

## 2013-09-14 ENCOUNTER — Other Ambulatory Visit: Payer: Self-pay | Admitting: *Deleted

## 2013-09-14 DIAGNOSIS — B20 Human immunodeficiency virus [HIV] disease: Secondary | ICD-10-CM

## 2013-09-14 MED ORDER — EFAVIRENZ-EMTRICITAB-TENOFOVIR 600-200-300 MG PO TABS: ORAL_TABLET | ORAL | Status: DC

## 2013-09-15 ENCOUNTER — Telehealth: Payer: Self-pay | Admitting: *Deleted

## 2013-09-15 DIAGNOSIS — R232 Flushing: Secondary | ICD-10-CM

## 2013-09-15 NOTE — Telephone Encounter (Addendum)
Received message from [facility] @ [facility] [phone].  She stated pt is requesting refill of Paroxetine 10 mg and Megestrol 20 mg. Per chart review, pt was given refill of Megestrol on 12/24/12 and Paroxetine on 12/31/12. She then [facility] on 01/05/13 for fu w/Dr. Ihor Dow. Message sent to Dr. Ihor Dow. 8/12  [phone]  Received message from Dr. Ihor Dow. She agreed to refill the requested medications for 1 month only- Rx sent to pharmacy. Pt needs Annual Gyn exam within a month - no additional refills until she has this appt.  Appt scheduled on 9/10 @ 1345. Called pt on mobile number and it is not valid. I then called pt @ work and left a message for her to call back and leave a message on nurse voice mail with a telephone number where she can be contacted.

## 2013-09-23 MED ORDER — PAROXETINE HCL 10 MG PO TABS: 20.0000 mg | ORAL_TABLET | ORAL | Status: DC

## 2013-09-23 MED ORDER — MEGESTROL ACETATE 20 MG PO TABS: 40.0000 mg | ORAL_TABLET | Freq: Every day | ORAL | Status: DC

## 2013-09-23 NOTE — Telephone Encounter (Signed)
Patient called back and left vm stating we can leave a detailed message. I called patient and left message informing her that the doctor has refilled her meds for one month and that she will need to call to make an appointment for an annual exam.

## 2013-09-30 ENCOUNTER — Encounter: Payer: Self-pay | Admitting: Infectious Diseases

## 2013-09-30 ENCOUNTER — Ambulatory Visit (INDEPENDENT_AMBULATORY_CARE_PROVIDER_SITE_OTHER): Payer: Self-pay | Admitting: Infectious Diseases

## 2013-09-30 VITALS — BP 126/79 | HR 86 | Temp 98.3°F | Ht 72.0 in | Wt 133.0 lb

## 2013-09-30 DIAGNOSIS — J4520 Mild intermittent asthma, uncomplicated: Secondary | ICD-10-CM

## 2013-09-30 DIAGNOSIS — B1710 Acute hepatitis C without hepatic coma: Secondary | ICD-10-CM

## 2013-09-30 DIAGNOSIS — B20 Human immunodeficiency virus [HIV] disease: Secondary | ICD-10-CM

## 2013-09-30 DIAGNOSIS — R51 Headache: Secondary | ICD-10-CM

## 2013-09-30 DIAGNOSIS — J45909 Unspecified asthma, uncomplicated: Secondary | ICD-10-CM

## 2013-09-30 DIAGNOSIS — Z23 Encounter for immunization: Secondary | ICD-10-CM

## 2013-09-30 DIAGNOSIS — F17200 Nicotine dependence, unspecified, uncomplicated: Secondary | ICD-10-CM

## 2013-09-30 DIAGNOSIS — N63 Unspecified lump in unspecified breast: Secondary | ICD-10-CM

## 2013-09-30 NOTE — Assessment & Plan Note (Signed)
Has meds, appears to be well controlled at this point.

## 2013-09-30 NOTE — Assessment & Plan Note (Signed)
She appears to be doing well. She did not have labs prior. Will see her back with labs in 4 months. Offered/refused condoms. Gets flu shot.

## 2013-09-30 NOTE — Assessment & Plan Note (Signed)
Encouraged to quit. 

## 2013-09-30 NOTE — Assessment & Plan Note (Signed)
Will d/i pharmacy to see where her application stands.

## 2013-09-30 NOTE — Progress Notes (Signed)
   Subjective:    Patient ID: [name], female    DOB: 1960/02/26, 53 y.o.   MRN: [id number]  HPI 53 yo F with a Hx of HIV+, Hep C, tobacco use. Has been on atripla (for convenience) since 2007 after having been successfully maintained on NFV for many years.  Previously seen allergies/asthma in urgent care after applying hey to her yard.  Still smoking.  Last PAP May 2015 (nl).   Headache is better. Her cervical LN is unresolved.  Hep C- was unable get her rx. Will try to get via pt assistance program.   Moves bowels once weekly.   Review of Systems  Constitutional: Negative for fever, chills, appetite change and unexpected weight change.  Gastrointestinal: Positive for constipation. Negative for diarrhea.  Genitourinary: Negative for difficulty urinating.  Hematological: Positive for adenopathy.       Objective:   Physical Exam  Constitutional: She appears well-developed and well-nourished.  HENT:  Mouth/Throat: No oropharyngeal exudate.  Eyes: EOM are normal. Pupils are equal, round, and reactive to light.  Neck: Neck supple.  Cardiovascular: Normal rate, regular rhythm and normal heart sounds.   Pulmonary/Chest: Effort normal and breath sounds normal.  Abdominal: Soft. Bowel sounds are normal. She exhibits no distension. There is no tenderness. There is no rebound.  Lymphadenopathy:    She has no cervical adenopathy.          Assessment & Plan:

## 2013-09-30 NOTE — Assessment & Plan Note (Signed)
Normal mammo 03-2013. Will mark as resolved.

## 2013-09-30 NOTE — Assessment & Plan Note (Signed)
Appears to be improving. Will rx imitrex if worsens.

## 2013-10-12 ENCOUNTER — Other Ambulatory Visit: Payer: Self-pay | Admitting: Infectious Diseases

## 2013-10-12 DIAGNOSIS — B20 Human immunodeficiency virus [HIV] disease: Secondary | ICD-10-CM

## 2013-10-14 ENCOUNTER — Telehealth: Payer: Self-pay | Admitting: *Deleted

## 2013-10-14 NOTE — Telephone Encounter (Signed)
[name] application faxed 04/20/43.  Requested pt call when she starts medication so that 4 weeks f/u visit may be scheduled.

## 2013-10-22 ENCOUNTER — Ambulatory Visit: Payer: Self-pay | Admitting: Obstetrics & Gynecology

## 2013-10-22 ENCOUNTER — Telehealth: Payer: Self-pay

## 2013-10-22 NOTE — Telephone Encounter (Signed)
Patient missed scheduled GYN appointment today. Attempted to call patient. NO answer. Left message stating we are sorry your missed your appointment, if you would like to reschedule please call clinic.

## 2013-11-23 ENCOUNTER — Ambulatory Visit (INDEPENDENT_AMBULATORY_CARE_PROVIDER_SITE_OTHER): Payer: Self-pay | Admitting: Infectious Diseases

## 2013-11-23 ENCOUNTER — Encounter: Payer: Self-pay | Admitting: Infectious Diseases

## 2013-11-23 VITALS — BP 145/96 | HR 83 | Temp 98.0°F | Wt 131.0 lb

## 2013-11-23 DIAGNOSIS — F17200 Nicotine dependence, unspecified, uncomplicated: Secondary | ICD-10-CM

## 2013-11-23 DIAGNOSIS — B1711 Acute hepatitis C with hepatic coma: Secondary | ICD-10-CM

## 2013-11-23 DIAGNOSIS — B20 Human immunodeficiency virus [HIV] disease: Secondary | ICD-10-CM

## 2013-11-23 DIAGNOSIS — F32A Depression, unspecified: Secondary | ICD-10-CM

## 2013-11-23 DIAGNOSIS — Z720 Tobacco use: Secondary | ICD-10-CM

## 2013-11-23 DIAGNOSIS — F329 Major depressive disorder, single episode, unspecified: Secondary | ICD-10-CM

## 2013-11-23 LAB — COMPREHENSIVE METABOLIC PANEL
ALBUMIN: 4 g/dL (ref 3.5–5.2)
ALT: 14 U/L (ref 0–35)
AST: 24 U/L (ref 0–37)
Alkaline Phosphatase: 98 U/L (ref 39–117)
BUN: 13 mg/dL (ref 6–23)
CHLORIDE: 110 meq/L (ref 96–112)
CO2: 25 mEq/L (ref 19–32)
CREATININE: 1.25 mg/dL — AB (ref 0.50–1.10)
Calcium: 9.1 mg/dL (ref 8.4–10.5)
GLUCOSE: 75 mg/dL (ref 70–99)
Potassium: 4 mEq/L (ref 3.5–5.3)
Sodium: 141 mEq/L (ref 135–145)
Total Bilirubin: 0.3 mg/dL (ref 0.2–1.2)
Total Protein: 7 g/dL (ref 6.0–8.3)

## 2013-11-23 LAB — CBC
HCT: 35.4 % — ABNORMAL LOW (ref 36.0–46.0)
HEMOGLOBIN: 11.5 g/dL — AB (ref 12.0–15.0)
MCH: 30.3 pg (ref 26.0–34.0)
MCHC: 32.5 g/dL (ref 30.0–36.0)
MCV: 93.4 fL (ref 78.0–100.0)
Platelets: 212 10*3/uL (ref 150–400)
RBC: 3.79 MIL/uL — ABNORMAL LOW (ref 3.87–5.11)
RDW: 13.4 % (ref 11.5–15.5)
WBC: 4.5 10*3/uL (ref 4.0–10.5)

## 2013-11-23 MED ORDER — ONDANSETRON HCL 4 MG PO TABS: 4.0000 mg | ORAL_TABLET | Freq: Three times a day (TID) | ORAL | Status: DC | PRN

## 2013-11-23 MED ORDER — PAROXETINE HCL 20 MG PO TABS: 20.0000 mg | ORAL_TABLET | Freq: Every day | ORAL | Status: DC

## 2013-11-23 NOTE — Assessment & Plan Note (Signed)
Will refill her paxil Will give her zofran.

## 2013-11-23 NOTE — Progress Notes (Signed)
   Subjective:    Patient ID: [name], female    DOB: 10-Dec-1960, 53 y.o.   MRN: [id number]  HPI 53 yo F with a Hx of HIV+, Hep C, tobacco use. Has been on atripla (for convenience) since 2007 after having been successfully maintained on NFV for many years.  She was started on Harvoni 1 month ago. Has been having nausea for the last week. She recently ran out of her hot-flashes rx (paxil).  She also developed chin swelling. Thinks she was bitten.   HIV 1 RNA Quant (copies/mL)  Date Value  06/11/2013 <20   12/15/2012 <20   05/21/2012 <20      CD4 T Cell Abs (/uL)  Date Value  06/11/2013 580   12/15/2012 620   05/21/2012 530     Review of Systems  Constitutional: Negative for fever, chills, appetite change and unexpected weight change.  Gastrointestinal: Positive for nausea and diarrhea.  Genitourinary: Negative for difficulty urinating.       Objective:   Physical Exam  Constitutional: She appears well-developed and well-nourished.  HENT:  Mouth/Throat: No oropharyngeal exudate.  Eyes: EOM are normal. Pupils are equal, round, and reactive to light.  Neck: Neck supple.  Cardiovascular: Normal rate.   Pulmonary/Chest: Effort normal and breath sounds normal.  Abdominal: Soft. Bowel sounds are normal. She exhibits no distension. There is no tenderness.  Lymphadenopathy:    She has no cervical adenopathy.          Assessment & Plan:

## 2013-11-23 NOTE — Assessment & Plan Note (Signed)
Encouraged to quit. 

## 2013-11-23 NOTE — Assessment & Plan Note (Signed)
She will continue harvoni. Will check her labs today.

## 2013-11-23 NOTE — Assessment & Plan Note (Signed)
Has gotten flu shot.  Offered/given condoms.  Will see back in 3 months.

## 2013-11-24 LAB — HEPATITIS C RNA QUANTITATIVE: HCV Quantitative Log: <1.18 {Log} (ref <1.18)

## 2013-11-24 LAB — HIV-1 RNA QUANT-NO REFLEX-BLD: HIV-1 RNA Quant, Log: <1.3 {Log} (ref <1.30)

## 2013-11-25 LAB — T-HELPER CELL (CD4) - (RCID CLINIC ONLY)
CD4 % Helper T Cell: 40 % (ref 33–55)
CD4 T Cell Abs: 850 /uL (ref 400–2700)

## 2013-12-14 ENCOUNTER — Other Ambulatory Visit: Payer: Self-pay | Admitting: Licensed Clinical Social Worker

## 2013-12-14 ENCOUNTER — Encounter: Payer: Self-pay | Admitting: Infectious Diseases

## 2013-12-14 DIAGNOSIS — B20 Human immunodeficiency virus [HIV] disease: Secondary | ICD-10-CM

## 2013-12-14 MED ORDER — ENSURE PO LIQD: 1.0000 | Freq: Two times a day (BID) | ORAL | Status: DC

## 2014-01-06 ENCOUNTER — Other Ambulatory Visit: Payer: Self-pay

## 2014-01-11 ENCOUNTER — Encounter: Payer: Self-pay | Admitting: Family Medicine

## 2014-01-11 ENCOUNTER — Ambulatory Visit (INDEPENDENT_AMBULATORY_CARE_PROVIDER_SITE_OTHER): Payer: Self-pay | Admitting: Family Medicine

## 2014-01-11 VITALS — BP 133/84 | HR 84 | Temp 98.1°F | Ht 72.0 in | Wt 125.2 lb

## 2014-01-11 DIAGNOSIS — F329 Major depressive disorder, single episode, unspecified: Secondary | ICD-10-CM

## 2014-01-11 DIAGNOSIS — Z01419 Encounter for gynecological examination (general) (routine) without abnormal findings: Secondary | ICD-10-CM

## 2014-01-11 DIAGNOSIS — R232 Flushing: Secondary | ICD-10-CM

## 2014-01-11 DIAGNOSIS — F32A Depression, unspecified: Secondary | ICD-10-CM

## 2014-01-11 MED ORDER — PAROXETINE HCL 20 MG PO TABS: 20.0000 mg | ORAL_TABLET | Freq: Every day | ORAL | Status: DC

## 2014-01-11 NOTE — Progress Notes (Signed)
  Subjective:     [name] is a 53 y.o. female and is here for a comprehensive physical exam. The patient reports problems - headache as side effect of the Harvoni.  She also complains of hot flashes.  She has been on paxil, which reduces her hot flashes.  She ran out of her medication and had some withdrawal symptoms.  Her ID doctor prescribed some until she could get in here.  History   Social History  . Marital Status: Single    Spouse Name: N/A    Number of Children: N/A  . Years of Education: N/A   Occupational History  . Not on file.   Social History Main Topics  . Smoking status: Current Every Day Smoker -- 0.50 packs/day for 33 years    Types: Cigarettes  . Smokeless tobacco: Never Used     Comment: cutting back  . Alcohol Use: No     Comment: recovered alcoholic for 10 years  . Drug Use: No     Comment: clean for 10 years  . Sexual Activity: No     Comment: pt. given condoms   Other Topics Concern  . Not on file   Social History Narrative   Health Maintenance  Topic Date Due  . Samul Dada  09/30/1979  . COLONOSCOPY  09/30/2010  . PAP SMEAR  06/27/2014  . INFLUENZA VACCINE  09/13/2014  . MAMMOGRAM  04/04/2015    The following portions of the patient's history were reviewed and updated as appropriate: allergies, current medications, past family history, past medical history, past social history, past surgical history and problem list.  Review of Systems A comprehensive review of systems was negative.   Objective:    BP 133/84 mmHg  Pulse 84  Temp(Src) 98.1 F (36.7 C) (Oral)  Ht 6' (1.829 m)  Wt 125 lb 3.2 oz (56.79 kg)  BMI 16.98 kg/m2  LMP 03/27/2011 General appearance: alert, cooperative and no distress Head: Normocephalic, without obvious abnormality, atraumatic Lungs: clear to auscultation bilaterally Breasts: normal appearance, no masses or tenderness Heart: regular rate and rhythm, S1, S2 normal, no murmur, click, rub or  gallop Abdomen: soft, non-tender; bowel sounds normal; no masses,  no organomegaly Pelvic: cervix normal in appearance, external genitalia normal, no adnexal masses or tenderness, no cervical motion tenderness, rectovaginal septum normal, uterus normal size, shape, and consistency and vagina normal without discharge Extremities: extremities normal, atraumatic, no cyanosis or edema Pulses: 2+ and symmetric Skin: Skin color, texture, turgor normal. No rashes or lesions Lymph nodes: Cervical, supraclavicular, and axillary nodes normal. Neurologic: Alert and oriented X 3, normal strength and tone. Normal symmetric reflexes. Normal coordination and gait    Assessment:    Healthy female exam. Hot flashes     Plan:     Continue paxil.  F/u in 1 year Due for mammogram in February See After Visit Summary for Counseling Recommendations

## 2014-01-20 ENCOUNTER — Ambulatory Visit: Payer: Self-pay | Admitting: Infectious Diseases

## 2014-01-27 ENCOUNTER — Ambulatory Visit (INDEPENDENT_AMBULATORY_CARE_PROVIDER_SITE_OTHER): Payer: Self-pay | Admitting: Infectious Diseases

## 2014-01-27 ENCOUNTER — Other Ambulatory Visit: Payer: Self-pay | Admitting: *Deleted

## 2014-01-27 ENCOUNTER — Encounter: Payer: Self-pay | Admitting: Infectious Diseases

## 2014-01-27 VITALS — BP 137/93 | HR 81 | Temp 97.8°F | Wt 122.0 lb

## 2014-01-27 DIAGNOSIS — F17200 Nicotine dependence, unspecified, uncomplicated: Secondary | ICD-10-CM

## 2014-01-27 DIAGNOSIS — Z79899 Other long term (current) drug therapy: Secondary | ICD-10-CM

## 2014-01-27 DIAGNOSIS — N951 Menopausal and female climacteric states: Secondary | ICD-10-CM

## 2014-01-27 DIAGNOSIS — B20 Human immunodeficiency virus [HIV] disease: Secondary | ICD-10-CM

## 2014-01-27 DIAGNOSIS — Z113 Encounter for screening for infections with a predominantly sexual mode of transmission: Secondary | ICD-10-CM

## 2014-01-27 DIAGNOSIS — B1710 Acute hepatitis C without hepatic coma: Secondary | ICD-10-CM

## 2014-01-27 DIAGNOSIS — F32A Depression, unspecified: Secondary | ICD-10-CM

## 2014-01-27 DIAGNOSIS — F329 Major depressive disorder, single episode, unspecified: Secondary | ICD-10-CM

## 2014-01-27 DIAGNOSIS — Z720 Tobacco use: Secondary | ICD-10-CM

## 2014-01-27 LAB — COMPREHENSIVE METABOLIC PANEL
ALK PHOS: 124 U/L — AB (ref 39–117)
ALT: 13 U/L (ref 0–35)
AST: 23 U/L (ref 0–37)
Albumin: 4.2 g/dL (ref 3.5–5.2)
BUN: 12 mg/dL (ref 6–23)
CO2: 27 meq/L (ref 19–32)
CREATININE: 1.08 mg/dL (ref 0.50–1.10)
Calcium: 9.1 mg/dL (ref 8.4–10.5)
Chloride: 105 mEq/L (ref 96–112)
GLUCOSE: 76 mg/dL (ref 70–99)
Potassium: 4.4 mEq/L (ref 3.5–5.3)
SODIUM: 139 meq/L (ref 135–145)
Total Bilirubin: 0.3 mg/dL (ref 0.2–1.2)
Total Protein: 7.2 g/dL (ref 6.0–8.3)

## 2014-01-27 MED ORDER — ENSURE PO LIQD: 1.0000 | Freq: Two times a day (BID) | ORAL | Status: DC

## 2014-01-27 MED ORDER — PAROXETINE HCL 20 MG PO TABS: 20.0000 mg | ORAL_TABLET | Freq: Every day | ORAL | Status: DC

## 2014-01-27 NOTE — Assessment & Plan Note (Addendum)
Very worried about her wt loss. She is given rx for ensure. She is on megace. Offered/refused condoms. Has gotten flu shot. Will see her back in 6 months. Pap smear in May at [facility].

## 2014-01-27 NOTE — Assessment & Plan Note (Signed)
Her paxil is refilled.

## 2014-01-27 NOTE — Progress Notes (Signed)
Patient ID: [name], female   DOB: [birth year], 53 y.o.   MRN: [id number] HPI: [name] is a 53 y.o. female who is here for follow up for her HIV and HEP C.   Lab Results  Component Value Date   HCVGENOTYPE 1a 06/24/2013    Allergies: Allergies  Allergen Reactions  . Aspirin     REACTION: ? heart murmur    Vitals: Temp: 97.8 F (36.6 C) (12/16 1530) Temp Source: Oral (12/16 1530) BP: 137/93 mmHg (12/16 1530) Pulse Rate: 81 (12/16 1530)  Past Medical History: Past Medical History  Diagnosis Date  . HIV (human immunodeficiency virus infection)   . Asthma   . Hepatitis C     Social History: History   Social History  . Marital Status: Single    Spouse Name: N/A    Number of Children: N/A  . Years of Education: N/A   Social History Main Topics  . Smoking status: Current Every Day Smoker -- 0.50 packs/day for 33 years    Types: Cigarettes  . Smokeless tobacco: Never Used     Comment: cutting back  . Alcohol Use: No     Comment: recovered alcoholic for 10 years  . Drug Use: No     Comment: clean for 10 years  . Sexual Activity: No     Comment: pt. given condoms   Other Topics Concern  . None   Social History Narrative    Labs: HIV 1 RNA QUANT (copies/mL)  Date Value  11/23/2013 <20  06/11/2013 <20  12/15/2012 <20   CD4 T CELL ABS (/uL)  Date Value  11/23/2013 850  06/11/2013 580  12/15/2012 620   HEP B S AB (no units)  Date Value  04/08/2006 YES   HEPATITIS B SURFACE AG (no units)  Date Value  04/08/2006 NO   HCV AB (no units)  Date Value  04/08/2006 YES    Lab Results  Component Value Date   HCVGENOTYPE 1a 06/24/2013    Hepatitis C RNA quantitative Latest Ref Rng 11/23/2013 06/24/2013 02/28/2010 04/08/2006  HCV Quantitative <15 IU/mL <15 174963(H) 406000(H) 1130000 (?)  HCV Quantitative Log <1.18 log 10 <1.18 5.24(H) - -    AST (U/L)  Date Value  11/23/2013 24  06/24/2013 25  06/11/2013 28   ALT (U/L)   Date Value  11/23/2013 14  06/24/2013 17  06/11/2013 22   INR (no units)  Date Value  06/24/2013 0.96    CrCl: Estimated Creatinine Clearance: 45.4 mL/min (by C-G formula based on Cr of 1.25).  Fibrosis Score:  Child-Pugh Score: Class A  Previous Treatment Regimen: Naive  Assessment: [name] has done very well for her HIV. She is still on Atripla which is prob ok with Harvoni. She got Harvoni through patient assistance. She just completed Harvoni 3-4 days ago. Her previous Hep VL was undetectable. She stated that she may have missed only  1 dose. We'll try to get her end of treatment VL today and a 12 wks SVR testing. Otherwise, she has done very well.   Recommendations: Cont Atripla Check hep C VL then in 3 months  Onnie Boer [name], [place].D., BCPS, AAHIVP Clinical Infectious [facility] 01/27/2014, 4:19 PM

## 2014-01-27 NOTE — Assessment & Plan Note (Signed)
Will recheck her labs today.  

## 2014-01-27 NOTE — Progress Notes (Signed)
   Subjective:    Patient ID: [name], female    DOB: 11-22-1960, 53 y.o.   MRN: [id number]  HPI 53 yo F with a Hx of HIV+, treated Hep C, tobacco use. Has been on atripla (for convenience) since 2007 after having been successfully maintained on NFV for many years.  Completed Harvoni 3 days ago.  She recently ran out of her hot-flashes rx (paxil).   Has lost ~ 30#. No appetite.  Taking ART without problems.  Ammonirrhea for ~ 5 years. Still having hot flashes, does not have insurance.  Last PAP was nl 06-2013.  HIV 1 RNA QUANT (copies/mL)  Date Value  11/23/2013 <20  06/11/2013 <20  12/15/2012 <20   CD4 T CELL ABS (/uL)  Date Value  11/23/2013 850  06/11/2013 580  12/15/2012 620    Review of Systems  Constitutional: Positive for appetite change and unexpected weight change.  Gastrointestinal: Negative for diarrhea and constipation.  Genitourinary: Negative for difficulty urinating and menstrual problem.       Objective:   Physical Exam  Constitutional: She appears cachectic.  HENT:  Mouth/Throat: No oropharyngeal exudate.  Eyes: EOM are normal. Pupils are equal, round, and reactive to light.  Neck: Neck supple.  Cardiovascular: Normal rate, regular rhythm and normal heart sounds.   Pulmonary/Chest: Effort normal and breath sounds normal.  Abdominal: Soft. Bowel sounds are normal. She exhibits no distension. There is no tenderness.  Lymphadenopathy:    She has no cervical adenopathy.          Assessment & Plan:

## 2014-01-27 NOTE — Assessment & Plan Note (Signed)
Encourage her to quit smoking

## 2014-01-29 LAB — HEPATITIS C RNA QUANTITATIVE: HCV QUANT: NOT DETECTED [IU]/mL (ref <15)

## 2014-02-16 ENCOUNTER — Other Ambulatory Visit: Payer: Self-pay | Admitting: *Deleted

## 2014-02-16 DIAGNOSIS — B20 Human immunodeficiency virus [HIV] disease: Secondary | ICD-10-CM

## 2014-02-16 MED ORDER — EFAVIRENZ-EMTRICITAB-TENOFOVIR 600-200-300 MG PO TABS: 1.0000 | ORAL_TABLET | Freq: Every day | ORAL | Status: DC

## 2014-04-28 ENCOUNTER — Other Ambulatory Visit (INDEPENDENT_AMBULATORY_CARE_PROVIDER_SITE_OTHER): Payer: Self-pay

## 2014-04-28 DIAGNOSIS — B1710 Acute hepatitis C without hepatic coma: Secondary | ICD-10-CM

## 2014-04-28 DIAGNOSIS — Z113 Encounter for screening for infections with a predominantly sexual mode of transmission: Secondary | ICD-10-CM

## 2014-04-28 DIAGNOSIS — B20 Human immunodeficiency virus [HIV] disease: Secondary | ICD-10-CM

## 2014-04-28 DIAGNOSIS — Z79899 Other long term (current) drug therapy: Secondary | ICD-10-CM

## 2014-04-28 LAB — COMPREHENSIVE METABOLIC PANEL
ALT: 13 U/L (ref 0–35)
AST: 22 U/L (ref 0–37)
Albumin: 4.2 g/dL (ref 3.5–5.2)
Alkaline Phosphatase: 111 U/L (ref 39–117)
BUN: 15 mg/dL (ref 6–23)
CALCIUM: 8.8 mg/dL (ref 8.4–10.5)
CHLORIDE: 109 meq/L (ref 96–112)
CO2: 26 meq/L (ref 19–32)
CREATININE: 0.95 mg/dL (ref 0.50–1.10)
Glucose, Bld: 76 mg/dL (ref 70–99)
POTASSIUM: 4.2 meq/L (ref 3.5–5.3)
Sodium: 142 mEq/L (ref 135–145)
Total Bilirubin: 0.3 mg/dL (ref 0.2–1.2)
Total Protein: 7.2 g/dL (ref 6.0–8.3)

## 2014-04-28 LAB — LIPID PANEL
Cholesterol: 162 mg/dL (ref 0–200)
HDL: 67 mg/dL (ref >=46)
LDL Cholesterol: 71 mg/dL (ref 0–99)
Total CHOL/HDL Ratio: 2.4 Ratio
Triglycerides: 120 mg/dL (ref <150)
VLDL: 24 mg/dL (ref 0–40)

## 2014-04-28 LAB — CBC
HCT: 35.4 % — ABNORMAL LOW (ref 36.0–46.0)
Hemoglobin: 11.1 g/dL — ABNORMAL LOW (ref 12.0–15.0)
MCH: 29.3 pg (ref 26.0–34.0)
MCHC: 31.4 g/dL (ref 30.0–36.0)
MCV: 93.4 fL (ref 78.0–100.0)
MPV: 10.3 fL (ref 8.6–12.4)
PLATELETS: 178 10*3/uL (ref 150–400)
RBC: 3.79 MIL/uL — AB (ref 3.87–5.11)
RDW: 13.8 % (ref 11.5–15.5)
WBC: 4.3 10*3/uL (ref 4.0–10.5)

## 2014-04-28 LAB — RPR: RPR: REACTIVE — AB

## 2014-04-28 LAB — RPR TITER: RPR Titer: 1:2 {titer}

## 2014-04-28 NOTE — Addendum Note (Signed)
Addended by: Dolan Amen D on: 04/28/2014 04:51 PM   Modules accepted: Orders

## 2014-04-29 ENCOUNTER — Other Ambulatory Visit: Payer: Self-pay | Admitting: *Deleted

## 2014-04-29 DIAGNOSIS — B20 Human immunodeficiency virus [HIV] disease: Secondary | ICD-10-CM

## 2014-04-29 LAB — T-HELPER CELL (CD4) - (RCID CLINIC ONLY)
CD4 % Helper T Cell: 34 % (ref 33–55)
CD4 T Cell Abs: 650 /uL (ref 400–2700)

## 2014-04-29 LAB — FLUORESCENT TREPONEMAL AB(FTA)-IGG-BLD: Fluorescent Treponemal ABS: NONREACTIVE

## 2014-04-29 MED ORDER — EFAVIRENZ-EMTRICITAB-TENOFOVIR 600-200-300 MG PO TABS: 1.0000 | ORAL_TABLET | Freq: Every day | ORAL | Status: DC

## 2014-04-29 NOTE — Telephone Encounter (Signed)
ADAP Application 

## 2014-04-30 LAB — HIV-1 RNA QUANT-NO REFLEX-BLD
HIV 1 RNA Quant: <20 copies/mL (ref <20)
HIV-1 RNA Quant, Log: <1.3 {Log} (ref <1.30)

## 2014-04-30 LAB — URINE CYTOLOGY ANCILLARY ONLY
Chlamydia: NEGATIVE
Neisseria Gonorrhea: NEGATIVE

## 2014-04-30 LAB — HEPATITIS C RNA QUANTITATIVE: HCV Quantitative: NOT DETECTED IU/mL (ref <15)

## 2014-07-15 ENCOUNTER — Other Ambulatory Visit: Payer: Self-pay

## 2014-07-15 DIAGNOSIS — B20 Human immunodeficiency virus [HIV] disease: Secondary | ICD-10-CM

## 2014-07-15 LAB — COMPLETE METABOLIC PANEL WITH GFR
ALBUMIN: 4.1 g/dL (ref 3.5–5.2)
ALT: 11 U/L (ref 0–35)
AST: 20 U/L (ref 0–37)
Alkaline Phosphatase: 123 U/L — ABNORMAL HIGH (ref 39–117)
BUN: 11 mg/dL (ref 6–23)
CO2: 26 meq/L (ref 19–32)
Calcium: 9.2 mg/dL (ref 8.4–10.5)
Chloride: 106 mEq/L (ref 96–112)
Creat: 0.86 mg/dL (ref 0.50–1.10)
GFR, EST AFRICAN AMERICAN: 89 mL/min
GFR, Est Non African American: 77 mL/min
GLUCOSE: 113 mg/dL — AB (ref 70–99)
POTASSIUM: 4.8 meq/L (ref 3.5–5.3)
Sodium: 141 mEq/L (ref 135–145)
Total Bilirubin: 0.3 mg/dL (ref 0.2–1.2)
Total Protein: 7.2 g/dL (ref 6.0–8.3)

## 2014-07-16 LAB — T-HELPER CELL (CD4) - (RCID CLINIC ONLY)
CD4 % Helper T Cell: 33 % (ref 33–55)
CD4 T CELL ABS: 440 /uL (ref 400–2700)

## 2014-07-18 LAB — HIV-1 RNA QUANT-NO REFLEX-BLD

## 2014-07-29 ENCOUNTER — Other Ambulatory Visit: Payer: Self-pay | Admitting: Infectious Diseases

## 2014-07-29 ENCOUNTER — Encounter: Payer: Self-pay | Admitting: Infectious Diseases

## 2014-07-29 ENCOUNTER — Ambulatory Visit (INDEPENDENT_AMBULATORY_CARE_PROVIDER_SITE_OTHER): Payer: Self-pay | Admitting: Infectious Diseases

## 2014-07-29 VITALS — BP 145/89 | HR 74 | Temp 98.5°F | Ht 72.0 in | Wt 124.0 lb

## 2014-07-29 DIAGNOSIS — Z720 Tobacco use: Secondary | ICD-10-CM

## 2014-07-29 DIAGNOSIS — F17200 Nicotine dependence, unspecified, uncomplicated: Secondary | ICD-10-CM

## 2014-07-29 DIAGNOSIS — R636 Underweight: Secondary | ICD-10-CM

## 2014-07-29 DIAGNOSIS — B20 Human immunodeficiency virus [HIV] disease: Secondary | ICD-10-CM

## 2014-07-29 DIAGNOSIS — B1710 Acute hepatitis C without hepatic coma: Secondary | ICD-10-CM

## 2014-07-29 DIAGNOSIS — Z1231 Encounter for screening mammogram for malignant neoplasm of breast: Secondary | ICD-10-CM

## 2014-07-29 HISTORY — DX: Underweight: R63.6

## 2014-07-29 MED ORDER — EMTRICITABINE-TENOFOVIR AF 200-25 MG PO TABS: 1.0000 | ORAL_TABLET | Freq: Every day | ORAL | Status: DC

## 2014-07-29 MED ORDER — ENSURE PO LIQD
1.0000 | Freq: Two times a day (BID) | ORAL | Status: DC
Start: 2014-07-29 — End: 2016-03-08

## 2014-07-29 MED ORDER — ENSURE PO LIQD: 1.0000 | Freq: Three times a day (TID) | ORAL | Status: DC

## 2014-07-29 NOTE — Progress Notes (Signed)
   Subjective:    Patient ID: [name], female    DOB: 1960-08-29, 54 y.o.   MRN: [id number]  HPI 54 yo F with HIV+ and cured Hep C and tobacco use. Has been on atripla (for convenience) since 2007 after having been successfully maintained on NFV for many years.  Last pap and mammo were (-) 06-2013 and 03-2013 respectively.  No apetite, does not feel that megace is helping. Using ensure.   HIV 1 RNA QUANT (copies/mL)  Date Value  07/15/2014 <20  04/28/2014 <20  11/23/2013 <20   CD4 T CELL ABS (/uL)  Date Value  07/15/2014 440  04/28/2014 650  11/23/2013 850   Review of Systems  Constitutional: Positive for unexpected weight change. Negative for appetite change.  Respiratory: Negative for shortness of breath.   Cardiovascular: Negative for chest pain.  Neurological: Positive for headaches.       Objective:   Physical Exam  Constitutional: She appears well-developed.  HENT:  Mouth/Throat: No oropharyngeal exudate.  Eyes: EOM are normal. Pupils are equal, round, and reactive to light.  Neck: Neck supple.  Cardiovascular: Normal rate, regular rhythm and normal heart sounds.   Pulmonary/Chest: Effort normal and breath sounds normal.  Abdominal: Soft. Bowel sounds are normal. There is no tenderness.  Musculoskeletal: She exhibits no edema.  Lymphadenopathy:    She has no cervical adenopathy.       Assessment & Plan:

## 2014-07-29 NOTE — Assessment & Plan Note (Addendum)
She is doing very well.  Offered/refused condoms.  Will change her to descovey Will get her Pap, mammo Will see her back in 3-4 months with labs prior.

## 2014-07-29 NOTE — Assessment & Plan Note (Signed)
Encouraged her to take megace Encouraged her to take ensure TID Encouraged her to eat TID.

## 2014-07-29 NOTE — Assessment & Plan Note (Signed)
Encouraged to quit. 

## 2014-07-29 NOTE — Assessment & Plan Note (Signed)
Resolved

## 2014-08-02 ENCOUNTER — Ambulatory Visit
Admission: RE | Admit: 2014-08-02 | Discharge: 2014-08-02 | Disposition: A | Discharge status: Home / Self Care | Payer: No Typology Code available for payment source | Source: Ambulatory Visit | Attending: Infectious Diseases | Admitting: Infectious Diseases

## 2014-08-02 ENCOUNTER — Other Ambulatory Visit: Payer: Self-pay | Admitting: Infectious Diseases

## 2014-08-02 DIAGNOSIS — R928 Other abnormal and inconclusive findings on diagnostic imaging of breast: Secondary | ICD-10-CM

## 2014-08-02 DIAGNOSIS — Z1231 Encounter for screening mammogram for malignant neoplasm of breast: Secondary | ICD-10-CM

## 2014-08-02 DIAGNOSIS — B20 Human immunodeficiency virus [HIV] disease: Secondary | ICD-10-CM

## 2014-08-09 ENCOUNTER — Other Ambulatory Visit: Payer: Self-pay

## 2014-08-09 ENCOUNTER — Other Ambulatory Visit: Payer: Self-pay | Admitting: Licensed Clinical Social Worker

## 2014-08-09 ENCOUNTER — Telehealth: Payer: Self-pay | Admitting: Licensed Clinical Social Worker

## 2014-08-09 ENCOUNTER — Other Ambulatory Visit: Payer: Self-pay | Admitting: Infectious Diseases

## 2014-08-09 DIAGNOSIS — R928 Other abnormal and inconclusive findings on diagnostic imaging of breast: Secondary | ICD-10-CM

## 2014-08-09 DIAGNOSIS — B20 Human immunodeficiency virus [HIV] disease: Secondary | ICD-10-CM

## 2014-08-09 MED ORDER — EMTRICITAB-RILPIVIR-TENOFOV AF 200-25-25 MG PO TABS: 1.0000 | ORAL_TABLET | Freq: Every day | ORAL | Status: DC

## 2014-08-09 NOTE — Telephone Encounter (Signed)
This should be TAF/EMT/RPV A new Rx was sent Thank you

## 2014-08-09 NOTE — Telephone Encounter (Signed)
Pharmacy called to clarify prescription for Descovey, they would like to make sure that this is the regimen that Dr. Johnnye Sima wants patient to be on. Please advise.

## 2014-08-10 ENCOUNTER — Ambulatory Visit
Admission: RE | Admit: 2014-08-10 | Discharge: 2014-08-10 | Disposition: A | Discharge status: Home / Self Care | Payer: No Typology Code available for payment source | Source: Ambulatory Visit | Attending: Infectious Diseases | Admitting: Infectious Diseases

## 2014-08-10 DIAGNOSIS — R928 Other abnormal and inconclusive findings on diagnostic imaging of breast: Secondary | ICD-10-CM

## 2014-09-13 ENCOUNTER — Ambulatory Visit: Payer: Self-pay

## 2014-11-15 ENCOUNTER — Other Ambulatory Visit (INDEPENDENT_AMBULATORY_CARE_PROVIDER_SITE_OTHER): Payer: Self-pay

## 2014-11-15 DIAGNOSIS — B1710 Acute hepatitis C without hepatic coma: Secondary | ICD-10-CM

## 2014-11-15 DIAGNOSIS — B20 Human immunodeficiency virus [HIV] disease: Secondary | ICD-10-CM

## 2014-11-15 LAB — TSH: TSH: 1.618 u[IU]/mL (ref 0.350–4.500)

## 2014-11-16 LAB — HEPATITIS C RNA QUANTITATIVE: HCV Quantitative: NOT DETECTED IU/mL (ref <15)

## 2014-11-17 LAB — HIV-1 RNA QUANT-NO REFLEX-BLD: HIV-1 RNA Quant, Log: <1.3 {Log} (ref <1.30)

## 2014-11-17 LAB — T-HELPER CELL (CD4) - (RCID CLINIC ONLY)
CD4 T CELL HELPER: 35 % (ref 33–55)
CD4 T Cell Abs: 600 /uL (ref 400–2700)

## 2014-11-30 ENCOUNTER — Encounter: Payer: Self-pay | Admitting: Infectious Diseases

## 2014-11-30 ENCOUNTER — Ambulatory Visit (INDEPENDENT_AMBULATORY_CARE_PROVIDER_SITE_OTHER): Payer: Self-pay | Admitting: Infectious Diseases

## 2014-11-30 VITALS — BP 143/94 | HR 71 | Temp 97.9°F | Ht 72.0 in | Wt 132.0 lb

## 2014-11-30 DIAGNOSIS — I10 Essential (primary) hypertension: Secondary | ICD-10-CM

## 2014-11-30 DIAGNOSIS — R636 Underweight: Secondary | ICD-10-CM

## 2014-11-30 DIAGNOSIS — Z79899 Other long term (current) drug therapy: Secondary | ICD-10-CM

## 2014-11-30 DIAGNOSIS — Z23 Encounter for immunization: Secondary | ICD-10-CM

## 2014-11-30 DIAGNOSIS — F17200 Nicotine dependence, unspecified, uncomplicated: Secondary | ICD-10-CM

## 2014-11-30 DIAGNOSIS — Z113 Encounter for screening for infections with a predominantly sexual mode of transmission: Secondary | ICD-10-CM

## 2014-11-30 DIAGNOSIS — B20 Human immunodeficiency virus [HIV] disease: Secondary | ICD-10-CM

## 2014-11-30 DIAGNOSIS — K5900 Constipation, unspecified: Secondary | ICD-10-CM | POA: Insufficient documentation

## 2014-11-30 MED ORDER — PSYLLIUM 48.57 % PO POWD: 1.0000 | Freq: Every day | ORAL | Status: DC

## 2014-11-30 NOTE — Progress Notes (Signed)
   Subjective:    Patient ID: [name], female    DOB: [birth year], 54 y.o.   MRN: [id number]  HPI 54 yo F with HIV+ and cured Hep C (no u/s) and tobacco use. Has been on Odefsy, prior was on atripla (for convenience) since 2007 after having been successfully maintained on NFV for many years.  Last pap and mammo were (-) 06-2013 and 03-2013 respectively.  Has been having heartburn and constipation. Has tried Gas-X, taking colace as needed.  Does eat canned vegetables.   HIV 1 RNA QUANT (copies/mL)  Date Value  11/15/2014 <20  07/15/2014 <20  04/28/2014 <20   CD4 T CELL ABS (/uL)  Date Value  11/15/2014 600  07/15/2014 440  04/28/2014 650   Review of Systems  Constitutional: Negative for appetite change and unexpected weight change.  HENT: Negative for postnasal drip and rhinorrhea.   Respiratory: Positive for cough. Negative for shortness of breath.   Gastrointestinal: Positive for constipation and abdominal distention. Negative for diarrhea.  Genitourinary: Negative for difficulty urinating.  Neurological: Positive for headaches.       Objective:   Physical Exam  Constitutional: She appears well-developed and well-nourished.  HENT:  Mouth/Throat: No oropharyngeal exudate.  Eyes: EOM are normal. Pupils are equal, round, and reactive to light.  Neck: Neck supple.  Cardiovascular: Normal rate, regular rhythm and normal heart sounds.   Pulmonary/Chest: Effort normal and breath sounds normal.  Abdominal: Soft. Bowel sounds are normal. There is no tenderness. There is no rebound.  Lymphadenopathy:    She has no cervical adenopathy.          Assessment & Plan:

## 2014-11-30 NOTE — Assessment & Plan Note (Signed)
Will give her metamucil

## 2014-11-30 NOTE — Assessment & Plan Note (Signed)
I have asked her to check her BP at home/pharmacy.  Will hold on starting her on anti-htn at this point.

## 2014-11-30 NOTE — Assessment & Plan Note (Signed)
She has cut down to 4 cigarettes/day.  I encouraged her to quit!

## 2014-11-30 NOTE — Assessment & Plan Note (Signed)
Will refill her megace as needed.

## 2014-11-30 NOTE — Assessment & Plan Note (Addendum)
She is doing well.  Offered/refused condoms.  Flu shot today Wants repeat PAP May 2017 Will see her back in 6 months.

## 2014-12-07 ENCOUNTER — Other Ambulatory Visit: Payer: Self-pay | Admitting: Infectious Diseases

## 2015-02-21 ENCOUNTER — Ambulatory Visit: Payer: Self-pay

## 2015-03-21 ENCOUNTER — Other Ambulatory Visit: Payer: Self-pay | Admitting: Infectious Diseases

## 2015-04-19 ENCOUNTER — Ambulatory Visit: Payer: Self-pay

## 2015-05-23 ENCOUNTER — Other Ambulatory Visit (INDEPENDENT_AMBULATORY_CARE_PROVIDER_SITE_OTHER): Payer: Self-pay

## 2015-05-23 DIAGNOSIS — Z79899 Other long term (current) drug therapy: Secondary | ICD-10-CM

## 2015-05-23 DIAGNOSIS — B20 Human immunodeficiency virus [HIV] disease: Secondary | ICD-10-CM

## 2015-05-23 DIAGNOSIS — Z113 Encounter for screening for infections with a predominantly sexual mode of transmission: Secondary | ICD-10-CM

## 2015-05-23 LAB — CBC
HCT: 36.2 % (ref 35.0–45.0)
Hemoglobin: 11.4 g/dL — ABNORMAL LOW (ref 11.7–15.5)
MCH: 28.6 pg (ref 27.0–33.0)
MCHC: 31.5 g/dL — ABNORMAL LOW (ref 32.0–36.0)
MCV: 90.7 fL (ref 80.0–100.0)
MPV: 10.7 fL (ref 7.5–12.5)
PLATELETS: 192 10*3/uL (ref 140–400)
RBC: 3.99 MIL/uL (ref 3.80–5.10)
RDW: 13.1 % (ref 11.0–15.0)
WBC: 4.3 10*3/uL (ref 3.8–10.8)

## 2015-05-24 LAB — COMPREHENSIVE METABOLIC PANEL
ALK PHOS: 98 U/L (ref 33–130)
ALT: 12 U/L (ref 6–29)
AST: 20 U/L (ref 10–35)
Albumin: 4.1 g/dL (ref 3.6–5.1)
BILIRUBIN TOTAL: 0.4 mg/dL (ref 0.2–1.2)
BUN: 14 mg/dL (ref 7–25)
CO2: 27 mmol/L (ref 20–31)
CREATININE: 1.31 mg/dL — AB (ref 0.50–1.05)
Calcium: 9.4 mg/dL (ref 8.6–10.4)
Chloride: 107 mmol/L (ref 98–110)
Glucose, Bld: 71 mg/dL (ref 65–99)
Potassium: 4.3 mmol/L (ref 3.5–5.3)
SODIUM: 140 mmol/L (ref 135–146)
TOTAL PROTEIN: 7.2 g/dL (ref 6.1–8.1)

## 2015-05-24 LAB — T-HELPER CELL (CD4) - (RCID CLINIC ONLY)
CD4 % Helper T Cell: 33 % (ref 33–55)
CD4 T CELL ABS: 570 /uL (ref 400–2700)

## 2015-05-24 LAB — RPR TITER

## 2015-05-24 LAB — HIV-1 RNA QUANT-NO REFLEX-BLD
HIV 1 RNA Quant: <20 copies/mL (ref <20)
HIV-1 RNA Quant, Log: <1.3 Log copies/mL (ref <1.30)

## 2015-05-24 LAB — LIPID PANEL
CHOLESTEROL: 141 mg/dL (ref 125–200)
HDL: 57 mg/dL (ref >=46)
LDL Cholesterol: 57 mg/dL (ref <130)
Total CHOL/HDL Ratio: 2.5 Ratio (ref <=5.0)
Triglycerides: 133 mg/dL (ref <150)
VLDL: 27 mg/dL (ref <30)

## 2015-05-24 LAB — RPR: RPR Ser Ql: REACTIVE — AB

## 2015-05-24 NOTE — Addendum Note (Signed)
Addended byMeriel Pica F on: 05/24/2015 08:44 AM   Modules accepted: Orders

## 2015-05-25 LAB — FLUORESCENT TREPONEMAL AB(FTA)-IGG-BLD: FLUORESCENT TREPONEMAL ABS: NONREACTIVE

## 2015-05-25 LAB — URINE CYTOLOGY ANCILLARY ONLY
CHLAMYDIA, DNA PROBE: NEGATIVE
NEISSERIA GONORRHEA: NEGATIVE

## 2015-05-30 ENCOUNTER — Telehealth: Payer: Self-pay | Admitting: *Deleted

## 2015-05-30 NOTE — Telephone Encounter (Signed)
-----   Message from Campbell Riches, MD sent at 05/30/2015  8:46 AM EDT ----- Pt needs repeat Cr this week please  ----- Message -----    From: Lab in [facility] Interface    Sent: 05/23/2015  11:36 PM      To: Campbell Riches, MD

## 2015-05-30 NOTE — Telephone Encounter (Signed)
Left message for patient asking her to call back regarding a lab result.  Let her know that Dr. Johnnye Sima would like to repeat some bloodwork. Landis Gandy, RN

## 2015-05-31 ENCOUNTER — Other Ambulatory Visit (INDEPENDENT_AMBULATORY_CARE_PROVIDER_SITE_OTHER): Payer: Self-pay

## 2015-05-31 ENCOUNTER — Telehealth: Payer: Self-pay | Admitting: *Deleted

## 2015-05-31 ENCOUNTER — Other Ambulatory Visit: Payer: Self-pay | Admitting: *Deleted

## 2015-05-31 DIAGNOSIS — B20 Human immunodeficiency virus [HIV] disease: Secondary | ICD-10-CM

## 2015-05-31 NOTE — Telephone Encounter (Signed)
-----   Message from Campbell Riches, MD sent at 05/30/2015  8:46 AM EDT ----- Pt needs repeat Cr this week please  ----- Message -----    From: Lab in [facility] Interface    Sent: 05/23/2015  11:36 PM      To: Campbell Riches, MD

## 2015-05-31 NOTE — Telephone Encounter (Signed)
Attempted to call patient and left another voice mail [name] left one yesterday) to call the clinic regarding lab results. [name]

## 2015-05-31 NOTE — Telephone Encounter (Signed)
Patient called and advised her per Dr Johnnye Sima she needs repeat labs. She advised she will try to get here by 4 pm today 05/31/15 to have them drawn. Advised how important the labs are and she advised she will do her best to make it before close today.

## 2015-05-31 NOTE — Telephone Encounter (Signed)
Patient came in for lab work today. [name]

## 2015-06-01 LAB — COMPLETE METABOLIC PANEL WITH GFR
ALT: 10 U/L (ref 6–29)
AST: 22 U/L (ref 10–35)
Albumin: 4.1 g/dL (ref 3.6–5.1)
Alkaline Phosphatase: 101 U/L (ref 33–130)
BUN: 18 mg/dL (ref 7–25)
CO2: 28 mmol/L (ref 20–31)
Calcium: 9.3 mg/dL (ref 8.6–10.4)
Chloride: 106 mmol/L (ref 98–110)
Creat: 1.06 mg/dL — ABNORMAL HIGH (ref 0.50–1.05)
GFR, EST AFRICAN AMERICAN: 69 mL/min (ref >=60)
GFR, EST NON AFRICAN AMERICAN: 60 mL/min (ref >=60)
GLUCOSE: 59 mg/dL — AB (ref 65–99)
POTASSIUM: 4.6 mmol/L (ref 3.5–5.3)
SODIUM: 143 mmol/L (ref 135–146)
Total Bilirubin: 0.3 mg/dL (ref 0.2–1.2)
Total Protein: 7.4 g/dL (ref 6.1–8.1)

## 2015-06-08 ENCOUNTER — Encounter: Payer: Self-pay | Admitting: Infectious Diseases

## 2015-06-08 ENCOUNTER — Ambulatory Visit (INDEPENDENT_AMBULATORY_CARE_PROVIDER_SITE_OTHER): Payer: Self-pay | Admitting: Infectious Diseases

## 2015-06-08 VITALS — BP 147/85 | HR 80 | Temp 98.2°F | Wt 153.0 lb

## 2015-06-08 DIAGNOSIS — B20 Human immunodeficiency virus [HIV] disease: Secondary | ICD-10-CM

## 2015-06-08 DIAGNOSIS — F17200 Nicotine dependence, unspecified, uncomplicated: Secondary | ICD-10-CM

## 2015-06-08 DIAGNOSIS — K5900 Constipation, unspecified: Secondary | ICD-10-CM

## 2015-06-08 NOTE — Assessment & Plan Note (Signed)
i encouraged her to drink at least 2 L of water daily.

## 2015-06-08 NOTE — Progress Notes (Signed)
   Subjective:    Patient ID: [name], female    DOB: September 19, 1960, 55 y.o.   MRN: [id number]  HPI 56 yo F with HIV+ and cured Hep C (no u/s) and tobacco use. Has been on Odefsy, prior was on atripla (for convenience) since 2007 after having been successfully maintained on NFV for many years.  Last pap and mammo were (-) 06-2013 and 07-2014 respectively.   HIV 1 RNA QUANT (copies/mL)  Date Value  05/23/2015 <20  11/15/2014 <20  07/15/2014 <20   CD4 T CELL ABS (/uL)  Date Value  05/23/2015 570  11/15/2014 600  07/15/2014 440   C/o constipation- going once every 2 weeks. Using OTC laxatives. drinking very little water.  Quit drinking coffee.  Still smoking.   Review of Systems  Constitutional: Negative for appetite change and unexpected weight change.  Respiratory: Positive for cough. Negative for shortness of breath.   Gastrointestinal: Positive for constipation.  Genitourinary: Negative for difficulty urinating.       Objective:   Physical Exam  Constitutional: She appears well-developed and well-nourished.  HENT:  Mouth/Throat: No oropharyngeal exudate.  Eyes: EOM are normal. Pupils are equal, round, and reactive to light.  Neck: Neck supple.  Cardiovascular: Normal rate, regular rhythm and normal heart sounds.   Pulmonary/Chest: Effort normal and breath sounds normal.  Abdominal: Soft. Bowel sounds are normal. There is no tenderness. There is no rebound.  Musculoskeletal: She exhibits no edema.  Lymphadenopathy:    She has no cervical adenopathy.       Assessment & Plan:

## 2015-06-08 NOTE — Assessment & Plan Note (Signed)
Encouraged to quit. 

## 2015-06-08 NOTE — Assessment & Plan Note (Signed)
She is doing well. Has had elevations in Cr prior. Encouraged her to drink more water.  Will see her back in 6 months.  Offered/refused condoms.  Will get her set up for PAP

## 2015-06-27 ENCOUNTER — Other Ambulatory Visit: Payer: Self-pay | Admitting: Infectious Diseases

## 2015-07-01 ENCOUNTER — Ambulatory Visit (INDEPENDENT_AMBULATORY_CARE_PROVIDER_SITE_OTHER): Payer: Self-pay | Admitting: *Deleted

## 2015-07-01 DIAGNOSIS — Z124 Encounter for screening for malignant neoplasm of cervix: Secondary | ICD-10-CM

## 2015-07-01 NOTE — Progress Notes (Signed)
  Subjective:     [name] is a 55 y.o. woman who comes in today for a  pap smear only.  Previous abnormal Pap smears: yes. Contraception: condoms  Objective:    LMP 03/27/2011 Pelvic Exam:  Pap smear obtained.   Assessment:    Screening pap smear.   Plan:    Follow up in one year, or as indicated by Pap results.  Pt given educational materials re: HIV and women, self-esteem, BSE, nutrition and diet management, PAP smears and partner safety. Pt given condoms

## 2015-07-01 NOTE — Patient Instructions (Signed)
Your results will be ready in about a week.  I will mail them to you.  Please obtain an "Orange" card through [facility] and bring the card to [facility] to be scanned into [facility].  If Dr. Johnnye Sima makes a referral then it will be processed through [facility] and they will contact you about the appointment.  The "Orange" card will also cover your Mammogram.  Thank you for coming to the Center for your care.  Langley Gauss, RN

## 2015-07-04 LAB — CYTOLOGY - PAP

## 2015-07-05 ENCOUNTER — Encounter: Payer: Self-pay | Admitting: *Deleted

## 2015-07-25 ENCOUNTER — Other Ambulatory Visit: Payer: Self-pay | Admitting: Infectious Diseases

## 2015-07-29 ENCOUNTER — Telehealth: Payer: Self-pay | Admitting: *Deleted

## 2015-07-29 NOTE — Telephone Encounter (Addendum)
Call from patient stating she now has the orange card and spoke to [facility] about stomach pain she has been having. I advised her to come in on Monday to have her card scanned and will ask Dr. Johnnye Sima to put in a referral to [place]. [place] notifies patient's when the appt has been scheduled. Myrtis Hopping

## 2015-08-11 ENCOUNTER — Telehealth: Payer: Self-pay

## 2015-08-11 DIAGNOSIS — K5900 Constipation, unspecified: Secondary | ICD-10-CM

## 2015-08-11 NOTE — Telephone Encounter (Signed)
Patient states at last office visit she was referred for GI consult. No one from our office has called.  She is still having constipation without relief of laxative.  Lower abdominal pain present.  She now has orange card and I explained she will go to a wait list for specialist.  Primary care referral needed.    Laverle Patter, RN    [place] appointment with [facility] to establish with primary care physician    September 16, 2015  Dr [place] Hollis.

## 2015-08-17 ENCOUNTER — Other Ambulatory Visit: Payer: Self-pay

## 2015-09-07 ENCOUNTER — Ambulatory Visit: Payer: Self-pay | Admitting: Infectious Diseases

## 2015-09-13 ENCOUNTER — Ambulatory Visit: Payer: Self-pay

## 2015-09-14 ENCOUNTER — Encounter: Payer: Self-pay | Admitting: Infectious Diseases

## 2015-09-16 ENCOUNTER — Encounter: Payer: Self-pay | Admitting: Family Medicine

## 2015-09-16 ENCOUNTER — Ambulatory Visit (INDEPENDENT_AMBULATORY_CARE_PROVIDER_SITE_OTHER): Payer: No Typology Code available for payment source | Admitting: Family Medicine

## 2015-09-16 VITALS — BP 130/81 | HR 81 | Temp 98.3°F | Resp 18 | Ht 72.0 in | Wt 154.0 lb

## 2015-09-16 DIAGNOSIS — Z1211 Encounter for screening for malignant neoplasm of colon: Secondary | ICD-10-CM

## 2015-09-16 MED ORDER — POLYETHYLENE GLYCOL 3350 17 GM/SCOOP PO POWD
17.0000 g | Freq: Two times a day (BID) | ORAL | 1 refills | Status: DC | PRN
Start: 2015-09-16 — End: 2016-03-19

## 2015-09-16 NOTE — Patient Instructions (Signed)
Try to eat more fruits and vegetables Use miralax as bottle instructions Return in 6 months. Sooner if needed.

## 2015-09-16 NOTE — Progress Notes (Signed)
[name], is a 55 y.o. female  [id number]  [id number]  DOB - 07-09-1960  CC:  Chief Complaint  Patient presents with  . Establish Care       HPI: [name] is a 55 y.o. female here to establish care. She has a history of HIV and Hep C who is being followed by [facility]. Her only complaint with which she needs our help is chronic constipation. She reports smoking 1/2 cigarettes daily, denies alcohol and drug use. She reports her tetanus is up to date, but she is need of colon cancer screen. She reports her PAP and mammogram and up to date. She is not on a restricted diet and does not exercise regularly.   Allergies  Allergen Reactions  . Aspirin     REACTION: ? heart murmur   Past Medical History:  Diagnosis Date  . Asthma   . Hepatitis C   . HIV (human immunodeficiency virus infection) ([place])    Current Outpatient Prescriptions on File Prior to Visit  Medication Sig Dispense Refill  . albuterol (PROVENTIL HFA;VENTOLIN HFA) 108 (90 BASE) MCG/ACT inhaler Inhale 2 puffs into the lungs every 4 (four) hours as needed for wheezing. 1 Inhaler 0  . ENSURE (ENSURE) Take 1 Can by mouth 2 (two) times daily between meals. 237 mL 11  . megestrol (MEGACE) 20 MG tablet Take 2 tablets (40 mg total) by mouth daily. 60 tablet 0  . ODEFSEY 200-25-25 MG TABS tablet TAKE 1 TABLET BY MOUTH DAILY WITH BREAKFAST 30 tablet 3  . PARoxetine (PAXIL) 20 MG tablet TAKE 1 TABLET BY MOUTH EVERY DAY 90 tablet 2   No current facility-administered medications on file prior to visit.    Family History  Problem Relation Age of Onset  . Cancer Mother     bone  . Diabetes Sister    Social History   Social History  . Marital status: Single    Spouse name: N/A  . Number of children: N/A  . Years of education: N/A   Occupational History  . Not on file.   Social History Main Topics  . Smoking status: Current Every Day Smoker    Packs/day: 0.50    Years: 33.00    Types:  Cigarettes  . Smokeless tobacco: Never Used     Comment: cutting back  . Alcohol use No     Comment: recovered alcoholic for 10 years  . Drug use: No     Comment: clean for 10 years  . Sexual activity: No     Comment: pt. given condoms   Other Topics Concern  . Not on file   Social History Narrative  . No narrative on file    Review of Systems: Constitutional: Negative for fever, chills, appetite change, weight loss,  Fatigue. Skin: Negative for rashes or lesions of concern. HENT: Negative for ear pain, ear discharge.nose bleeds Eyes: Negative for pain, discharge, redness, itching and visual disturbance. Neck: Negative for pain, stiffness Respiratory: Negative for cough, shortness of breath,   Cardiovascular: Negative for chest pain, palpitations and leg swelling. Gastrointestinal: Negative for abdominal pain, nausea, vomiting, diarrhea. Positive for constipation.  Genitourinary: Negative for dysuria, urgency, frequency, hematuria,  Musculoskeletal: Negative for back pain, joint pain, joint  swelling, and gait problem.Negative for weakness.Positive for low back and knee pain Neurological: Negative for dizziness, tremors, seizures, syncope,   light-headedness, numbness. Positive for headaches Hematological: Negative for easy bruising or bleeding Psychiatric/Behavioral: Negative for depression, anxiety, decreased concentration, confusion  Objective:   Vitals:   09/16/15 1401  BP: 130/81  Pulse: 81  Resp: 18  Temp: 98.3 F (36.8 C)    Physical Exam: Constitutional: Patient appears well-developed and well-nourished. No distress. HENT: Normocephalic, atraumatic, External right and left ear normal. Oropharynx is clear and moist.  Eyes: Conjunctivae and EOM are normal. PERRLA, no scleral icterus. Neck: Normal ROM. Neck supple. No lymphadenopathy, No thyromegaly. CVS: RRR, S1/S2 +, no murmurs, no gallops, no rubs Pulmonary: Effort and breath sounds normal, no stridor,  rhonchi, wheezes, rales.  Abdominal: Soft. Normoactive BS,, no distension, tenderness, rebound or guarding.  Musculoskeletal: Normal range of motion. No edema and no tenderness.  Neuro: Alert.Normal muscle tone coordination. Non-focal Skin: Skin is warm and dry. No rash noted. Not diaphoretic. No erythema. No pallor. Psychiatric: Normal mood and affect. Behavior, judgment, thought content normal.  Lab Results  Component Value Date   WBC 4.3 05/23/2015   HGB 11.4 (L) 05/23/2015   HCT 36.2 05/23/2015   MCV 90.7 05/23/2015   PLT 192 05/23/2015   Lab Results  Component Value Date   CREATININE 1.06 (H) 05/31/2015   BUN 18 05/31/2015   NA 143 05/31/2015   K 4.6 05/31/2015   CL 106 05/31/2015   CO2 28 05/31/2015    No results found for: HGBA1C Lipid Panel     Component Value Date/Time   CHOL 141 05/23/2015 1526   TRIG 133 05/23/2015 1526   HDL 57 05/23/2015 1526   CHOLHDL 2.5 05/23/2015 1526   VLDL 27 05/23/2015 1526   LDLCALC 57 05/23/2015 1526       Assessment and plan:   1. Screening for colon cancer  - POC Hemoccult Bld/Stl (3-Cd Home Screen); Future  2. Constipation -Miralax  Return in about 6 months (around 03/18/2016).  The patient was given clear instructions to go to ER or return to medical center if symptoms don't improve, worsen or new problems develop. The patient verbalized understanding.    Micheline Chapman FNP  09/16/2015, 2:40 PM

## 2015-09-16 NOTE — Progress Notes (Signed)
Patient is here to Establish Care  Patient denies pain at this time.  Patient has taken medication today and patient has eaten.

## 2015-10-10 ENCOUNTER — Other Ambulatory Visit (INDEPENDENT_AMBULATORY_CARE_PROVIDER_SITE_OTHER): Payer: No Typology Code available for payment source

## 2015-10-10 DIAGNOSIS — Z1211 Encounter for screening for malignant neoplasm of colon: Secondary | ICD-10-CM

## 2015-10-10 LAB — POC HEMOCCULT BLD/STL (HOME/3-CARD/SCREEN)
Card #2 Fecal Occult Blod, POC: NEGATIVE
FECAL OCCULT BLD: NEGATIVE
Fecal Occult Blood, POC: NEGATIVE

## 2015-10-10 MED FILL — POLYETHYLENE GLYCOL 3350: 15 days supply | Qty: 510 | Fill #0

## 2015-10-31 ENCOUNTER — Other Ambulatory Visit (INDEPENDENT_AMBULATORY_CARE_PROVIDER_SITE_OTHER): Payer: No Typology Code available for payment source

## 2015-10-31 DIAGNOSIS — B20 Human immunodeficiency virus [HIV] disease: Secondary | ICD-10-CM

## 2015-11-01 LAB — HIV-1 RNA QUANT-NO REFLEX-BLD: HIV-1 RNA Quant, Log: <1.3 Log copies/mL (ref <1.30)

## 2015-11-01 LAB — T-HELPER CELL (CD4) - (RCID CLINIC ONLY)
CD4 % Helper T Cell: 27 % — ABNORMAL LOW (ref 33–55)
CD4 T Cell Abs: 520 /uL (ref 400–2700)

## 2015-11-14 ENCOUNTER — Encounter: Payer: Self-pay | Admitting: Infectious Diseases

## 2015-11-14 ENCOUNTER — Ambulatory Visit (INDEPENDENT_AMBULATORY_CARE_PROVIDER_SITE_OTHER): Payer: No Typology Code available for payment source | Admitting: Infectious Diseases

## 2015-11-14 VITALS — BP 125/83 | HR 82 | Temp 98.1°F | Ht 72.0 in | Wt 156.0 lb

## 2015-11-14 DIAGNOSIS — Z0000 Encounter for general adult medical examination without abnormal findings: Secondary | ICD-10-CM

## 2015-11-14 DIAGNOSIS — Z1211 Encounter for screening for malignant neoplasm of colon: Secondary | ICD-10-CM

## 2015-11-14 DIAGNOSIS — Z113 Encounter for screening for infections with a predominantly sexual mode of transmission: Secondary | ICD-10-CM

## 2015-11-14 DIAGNOSIS — Z23 Encounter for immunization: Secondary | ICD-10-CM

## 2015-11-14 DIAGNOSIS — Z87898 Personal history of other specified conditions: Secondary | ICD-10-CM

## 2015-11-14 DIAGNOSIS — Z79899 Other long term (current) drug therapy: Secondary | ICD-10-CM

## 2015-11-14 DIAGNOSIS — K5900 Constipation, unspecified: Secondary | ICD-10-CM

## 2015-11-14 DIAGNOSIS — B20 Human immunodeficiency virus [HIV] disease: Secondary | ICD-10-CM

## 2015-11-14 NOTE — Assessment & Plan Note (Signed)
Will send her for mammo Will send her for colon Will send her for live u/s to f/u her prev Hep C She is doing well She is given flu and pnvx Will see her back in 6 months with labs.

## 2015-11-14 NOTE — Progress Notes (Signed)
   Subjective:    Patient ID: [name], female    DOB: Dec 16, 1960, 55 y.o.   MRN: [id number]  HPI 55 yo F with HIV+ and cured Hep C (no u/s) and tobacco use. Has been on Odefsy, prior was on atripla (for convenience) since 2007 after having been successfully maintained on NFV for many years.  Last pap and mammo were (-) 06-2015 and 07-2014 respectively.   Has been feeling well.  No problems with medications. Taking with food.  Taking powder medicine for her constipation.   HIV 1 RNA Quant (copies/mL)  Date Value  10/31/2015 <20  05/23/2015 <20  11/15/2014 <20   CD4 T Cell Abs (/uL)  Date Value  10/31/2015 520  05/23/2015 570  11/15/2014 600    Review of Systems  Constitutional: Negative for appetite change and unexpected weight change.  Gastrointestinal: Positive for constipation. Negative for diarrhea.  Genitourinary: Negative for difficulty urinating.  drinking "not much water".   No prev colon     Objective:   Physical Exam  Constitutional: She appears well-developed and well-nourished.  HENT:  Mouth/Throat: No oropharyngeal exudate.  Eyes: EOM are normal. Pupils are equal, round, and reactive to light.  Neck: Neck supple.  Cardiovascular: Normal rate, regular rhythm and normal heart sounds.   Pulmonary/Chest: Effort normal and breath sounds normal.  Abdominal: Soft. Bowel sounds are normal. There is no tenderness. There is no rebound.  Musculoskeletal: She exhibits no edema.  Lymphadenopathy:    She has no cervical adenopathy.       Assessment & Plan:

## 2015-11-14 NOTE — Addendum Note (Signed)
Addended by: Landis Gandy on: 11/14/2015 12:30 PM   Modules accepted: Orders

## 2015-11-14 NOTE — Assessment & Plan Note (Signed)
Continue rx from PCP Encourage water intake.

## 2015-11-15 ENCOUNTER — Telehealth: Payer: Self-pay | Admitting: *Deleted

## 2015-11-15 NOTE — Telephone Encounter (Signed)
Per Dr Johnnye Sima called the patient to advise that the referrals for mammogram will be scheduled once she fills out scholarship information and returns it to the office. She advised she had received a call from the imaging center and they are sending it to her today. Advised to fill out as soon as possible and return.  Patient is uninsured and her information was sent to [facility] for GI appt and Ultrasound appt as well. We wait to hear if she get scheduled and if not will resend 12/14/15.

## 2015-11-15 NOTE — Addendum Note (Signed)
Addended by: Landis Gandy on: 11/15/2015 10:32 AM   Modules accepted: Orders

## 2015-11-17 NOTE — Addendum Note (Signed)
Addended by: Landis Gandy on: 11/17/2015 09:54 AM   Modules accepted: Orders

## 2015-11-18 ENCOUNTER — Telehealth: Payer: Self-pay | Admitting: Lab

## 2015-11-18 NOTE — Telephone Encounter (Signed)
Please disregard previous message regarding PCP referral.  It is a mistake

## 2015-11-18 NOTE — Telephone Encounter (Signed)
Dr Johnnye Sima created a referral for pt to be given an Internal Medicine PCP to establish care.  I called the pt on 11/17/2015.  I let her know that in order for her to get a PCP that would take her plan she would need to call the customer service number on the back of her insurance card.  I also told her that if  she had any problem with that, she could call me back and I would do what I could to help her

## 2016-01-16 ENCOUNTER — Telehealth: Payer: Self-pay | Admitting: Lab

## 2016-01-16 NOTE — Telephone Encounter (Signed)
Received an  email from [facility] stating that they are unable to fill this request.  It has to be a medical reason and this is a referral for a  Screening colonoscopy.

## 2016-01-16 NOTE — Telephone Encounter (Signed)
Left message for patient regarding mammogram.  11/15/15 Patient was informed to fill out the mammogram scholarship form and return it . Since her records don't reflect that the mamo has been done, I reached out to the patient to get an update

## 2016-01-18 NOTE — Telephone Encounter (Signed)
Patient called today stating that she never filled out the form for the [place] given to her in October.  She said that she would come by on 12/7 to fill out the form.

## 2016-01-20 NOTE — Telephone Encounter (Signed)
Patient came by the office on 01/19/16 to fill out the scholarship form for a mammogram.  I faxed it to the [facility] today.

## 2016-02-01 ENCOUNTER — Other Ambulatory Visit: Payer: Self-pay | Admitting: Infectious Diseases

## 2016-02-01 DIAGNOSIS — Z23 Encounter for immunization: Secondary | ICD-10-CM

## 2016-02-01 DIAGNOSIS — Z0000 Encounter for general adult medical examination without abnormal findings: Secondary | ICD-10-CM

## 2016-02-01 DIAGNOSIS — B20 Human immunodeficiency virus [HIV] disease: Secondary | ICD-10-CM

## 2016-02-01 DIAGNOSIS — Z87898 Personal history of other specified conditions: Secondary | ICD-10-CM

## 2016-02-01 DIAGNOSIS — Z1231 Encounter for screening mammogram for malignant neoplasm of breast: Secondary | ICD-10-CM

## 2016-02-09 ENCOUNTER — Ambulatory Visit
Admission: RE | Admit: 2016-02-09 | Discharge: 2016-02-09 | Disposition: A | Discharge status: Home / Self Care | Payer: No Typology Code available for payment source | Source: Ambulatory Visit | Attending: Infectious Diseases | Admitting: Infectious Diseases

## 2016-02-09 DIAGNOSIS — Z1231 Encounter for screening mammogram for malignant neoplasm of breast: Secondary | ICD-10-CM

## 2016-02-10 ENCOUNTER — Other Ambulatory Visit: Payer: Self-pay | Admitting: Infectious Diseases

## 2016-02-10 DIAGNOSIS — B20 Human immunodeficiency virus [HIV] disease: Secondary | ICD-10-CM

## 2016-03-08 ENCOUNTER — Other Ambulatory Visit: Payer: Self-pay | Admitting: *Deleted

## 2016-03-08 DIAGNOSIS — B20 Human immunodeficiency virus [HIV] disease: Secondary | ICD-10-CM

## 2016-03-08 MED ORDER — ENSURE PO LIQD: 1.0000 | Freq: Two times a day (BID) | ORAL | 11 refills | Status: DC

## 2016-03-19 ENCOUNTER — Encounter: Payer: Self-pay | Admitting: Family Medicine

## 2016-03-19 ENCOUNTER — Ambulatory Visit (INDEPENDENT_AMBULATORY_CARE_PROVIDER_SITE_OTHER): Payer: Self-pay | Admitting: Family Medicine

## 2016-03-19 VITALS — BP 140/84 | HR 81 | Temp 97.9°F | Resp 18 | Ht 72.0 in | Wt 159.4 lb

## 2016-03-19 DIAGNOSIS — R635 Abnormal weight gain: Secondary | ICD-10-CM

## 2016-03-19 DIAGNOSIS — K581 Irritable bowel syndrome with constipation: Secondary | ICD-10-CM

## 2016-03-19 DIAGNOSIS — B20 Human immunodeficiency virus [HIV] disease: Secondary | ICD-10-CM

## 2016-03-19 LAB — POCT GLYCOSYLATED HEMOGLOBIN (HGB A1C): HEMOGLOBIN A1C: 4.8

## 2016-03-19 LAB — TSH: TSH: 1.64 m[IU]/L

## 2016-03-19 MED ORDER — LUBIPROSTONE 8 MCG PO CAPS
8.0000 ug | ORAL_CAPSULE | Freq: Every day | ORAL | 5 refills | Status: DC
Start: 2016-03-19 — End: 2016-04-05

## 2016-03-19 MED FILL — AMITIZA 8 MCG CAPSULE: 8 | 30 days supply | Qty: 30 | Fill #0

## 2016-03-19 NOTE — Progress Notes (Signed)
Subjective:    Patient ID: [name], female    DOB: [birth year], 56 y.o.   MRN: [id number]  Constipation  This is a chronic problem. The current episode started more than 1 year ago. The problem is unchanged. Her stool frequency is 1 time per week or less. The stool is described as firm. The patient is not on a high fiber diet. She does not exercise regularly. There has not been adequate water intake. Associated symptoms include abdominal pain, bloating and nausea. Pertinent negatives include no anorexia, diarrhea, difficulty urinating, fecal incontinence, fever, flatus, hematochezia, hemorrhoids, rectal pain, vomiting or weight loss. Risk factors include dietary change. She has tried laxatives and stool softeners for the symptoms.   Past Medical History:  Diagnosis Date  . Asthma   . Hepatitis C   . HIV (human immunodeficiency virus infection) ([place])    Social History   Social History  . Marital status: Single    Spouse name: N/A  . Number of children: N/A  . Years of education: N/A   Occupational History  . Not on file.   Social History Main Topics  . Smoking status: Current Every Day Smoker    Packs/day: 0.50    Years: 33.00    Types: Cigarettes  . Smokeless tobacco: Never Used     Comment: cutting back  . Alcohol use No     Comment: recovered alcoholic for 10 years  . Drug use: No     Comment: clean for 10 years  . Sexual activity: No     Comment: pt. given condoms   Other Topics Concern  . Not on file   Social History Narrative  . No narrative on file   Allergies  Allergen Reactions  . Aspirin     REACTION: ? heart murmur   Immunization History  Administered Date(s) Administered  . Hepatitis A 03/06/2002  . Hepatitis B 01/05/2003, 02/04/2003, 07/01/2003  . Influenza Split 12/01/2010, 12/05/2011  . Influenza Whole 10/24/2005, 11/07/2007, 12/01/2008, 11/30/2009  . Influenza,inj,Quad PF,36+ Mos 12/31/2012, 09/30/2013, 11/30/2014, 11/14/2015  .  Pneumococcal Polysaccharide-23 03/14/2010, 11/14/2015     Review of Systems  Constitutional: Negative.  Negative for fever and weight loss.  HENT: Negative.   Respiratory: Negative.   Cardiovascular: Negative.   Gastrointestinal: Positive for abdominal pain, bloating, constipation and nausea. Negative for anorexia, diarrhea, flatus, hematochezia, hemorrhoids, rectal pain and vomiting.  Genitourinary: Negative.  Negative for difficulty urinating.  Musculoskeletal: Negative.   Allergic/Immunologic: Positive for immunocompromised state (HIV +, Hep C +).  Neurological: Negative.   Hematological: Negative.   Psychiatric/Behavioral: Negative.        Objective:   Physical Exam  Constitutional: She is oriented to person, place, and time. She appears well-developed and well-nourished.  HENT:  Head: Normocephalic and atraumatic.  Right Ear: External ear normal.  Left Ear: External ear normal.  Mouth/Throat: Oropharynx is clear and moist.  Eyes: Conjunctivae and EOM are normal. Pupils are equal, round, and reactive to light.  Neck: Normal range of motion. Neck supple.  Cardiovascular: Normal rate, regular rhythm, normal heart sounds and intact distal pulses.   Pulmonary/Chest: Effort normal.  Abdominal: Soft. Bowel sounds are normal. She exhibits distension.  Neurological: She is alert and oriented to person, place, and time. She has normal reflexes.  Skin: Skin is warm and dry.  Psychiatric: She has a normal mood and affect. Her behavior is normal. Judgment and thought content normal.       BP 140/84 (BP Location:  Right Arm, Patient Position: Sitting, Cuff Size: Normal)   Pulse 81   Temp 97.9 F (36.6 C) (Oral)   Resp 18   Ht 6' (1.829 m)   Wt 159 lb 6.4 oz (72.3 kg)   LMP 03/27/2011   SpO2 100%   BMI 21.62 kg/m  Assessment & Plan:  1. Irritable bowel syndrome with constipation A high fiber diet with plenty of fluids (up to 8 glasses of water daily) is suggested to relieve  these symptoms.   - lubiprostone (AMITIZA) 8 MCG capsule; Take 1 capsule (8 mcg total) by mouth daily with breakfast.  Dispense: 30 capsule; Refill: 5  2. Weight gain - TSH - HgB A1c  3. Human immunodeficiency virus (HIV) disease ([place]) Consistent with medication regimen. Follow up with Dr. Johnnye Sima as previously scheduled   RTC: 3 months for IBS  Zaneta Lightcap M, FNP    The patient was given clear instructions to go to ER or return to medical center if symptoms do not improve, worsen or new problems develop. The patient verbalized understanding. Will notify patient with laboratory results.

## 2016-03-19 NOTE — Patient Instructions (Addendum)
Diet for Irritable Bowel Syndrome Introduction When you have irritable bowel syndrome (IBS), the foods you eat and your eating habits are very important. IBS may cause various symptoms, such as abdominal pain, constipation, or diarrhea. Choosing the right foods can help ease discomfort caused by these symptoms. Work with your health care provider and dietitian to find the best eating plan to help control your symptoms. What general guidelines do I need to follow?  Keep a food diary. This will help you identify foods that cause symptoms. Write down:  What you eat and when.  What symptoms you have.  When symptoms occur in relation to your meals.  Avoid foods that cause symptoms. Talk with your dietitian about other ways to get the same nutrients that are in these foods.  Eat more foods that contain fiber. Take a fiber supplement if directed by your dietitian.  Eat your meals slowly, in a relaxed setting.  Aim to eat 5-6 small meals per day. Do not skip meals.  Drink enough fluids to keep your urine clear or pale yellow.  Ask your health care provider if you should take an over-the-counter probiotic during flare-ups to help restore healthy gut bacteria.  If you have cramping or diarrhea, try making your meals low in fat and high in carbohydrates. Examples of carbohydrates are pasta, rice, whole grain breads and cereals, fruits, and vegetables.  If dairy products cause your symptoms to flare up, try eating less of them. You might be able to handle yogurt better than other dairy products because it contains bacteria that help with digestion. What foods are not recommended? The following are some foods and drinks that may worsen your symptoms:  Fatty foods, such as [place] fries.  Milk products, such as cheese or ice cream.  Chocolate.  Alcohol.  Products with caffeine, such as coffee.  Carbonated drinks, such as soda. The items listed above may not be a complete list of foods and  beverages to avoid. Contact your dietitian for more information.  What foods are good sources of fiber? Your health care provider or dietitian may recommend that you eat more foods that contain fiber. Fiber can help reduce constipation and other IBS symptoms. Add foods with fiber to your diet a little at a time so that your body can get used to them. Too much fiber at once might cause gas and swelling of your abdomen. The following are some foods that are good sources of fiber:  Apples.  Peaches.  Pears.  Berries.  Figs.  Broccoli (raw).  Cabbage.  Carrots.  Raw peas.  Kidney beans.  Lima beans.  Whole grain bread.  Whole grain cereal. Where to find more information: [employer] for Functional Gastrointestinal Disorders: www.iffgd.[employer] of Diabetes and Digestive and Kidney Diseases: NetworkAffair.co.za.aspx This information is not intended to replace advice given to you by your health care provider. Make sure you discuss any questions you have with your health care provider. Document Released: 04/21/2003 Document Revised: 07/07/2015 Document Reviewed: 05/01/2013  2017 Elsevier

## 2016-03-22 ENCOUNTER — Ambulatory Visit: Payer: Self-pay

## 2016-03-26 ENCOUNTER — Telehealth: Payer: Self-pay | Admitting: Lab

## 2016-03-26 ENCOUNTER — Ambulatory Visit: Payer: Self-pay | Attending: Internal Medicine

## 2016-03-26 NOTE — Telephone Encounter (Signed)
I left a message for patient 01/12/& 03/26/2016 regarding orange card renewal.  I explained that I cant resubmit to [facility] until orange card is renewed.  Last time submitted was December 2017.

## 2016-03-29 ENCOUNTER — Encounter: Payer: Self-pay | Admitting: Infectious Diseases

## 2016-04-05 ENCOUNTER — Other Ambulatory Visit: Payer: Self-pay | Admitting: *Deleted

## 2016-04-05 DIAGNOSIS — K581 Irritable bowel syndrome with constipation: Secondary | ICD-10-CM

## 2016-04-05 MED ORDER — LUBIPROSTONE 8 MCG PO CAPS: 8.0000 ug | ORAL_CAPSULE | Freq: Every day | ORAL | 3 refills | Status: DC

## 2016-04-05 NOTE — Telephone Encounter (Signed)
PRINTED FOR PASS PROGRAM 

## 2016-04-16 ENCOUNTER — Other Ambulatory Visit: Payer: Self-pay | Admitting: Family Medicine

## 2016-04-16 DIAGNOSIS — K581 Irritable bowel syndrome with constipation: Secondary | ICD-10-CM

## 2016-04-16 MED ORDER — LUBIPROSTONE 24 MCG PO CAPS: 24.0000 ug | ORAL_CAPSULE | Freq: Every day | ORAL | 5 refills | Status: DC

## 2016-04-17 ENCOUNTER — Other Ambulatory Visit: Payer: Self-pay

## 2016-04-17 DIAGNOSIS — K581 Irritable bowel syndrome with constipation: Secondary | ICD-10-CM

## 2016-04-17 MED ORDER — LUBIPROSTONE 24 MCG PO CAPS: 24.0000 ug | ORAL_CAPSULE | Freq: Every day | ORAL | 5 refills | Status: DC

## 2016-04-17 NOTE — Telephone Encounter (Signed)
Called and left a message patient [facility] Thanks!

## 2016-04-18 MED FILL — $AMITIZA 8 MCG CAPSULE: 8 MCG | 20 days supply | Qty: 60 | Fill #0

## 2016-05-02 ENCOUNTER — Other Ambulatory Visit (INDEPENDENT_AMBULATORY_CARE_PROVIDER_SITE_OTHER): Payer: Self-pay

## 2016-05-02 DIAGNOSIS — Z113 Encounter for screening for infections with a predominantly sexual mode of transmission: Secondary | ICD-10-CM

## 2016-05-02 DIAGNOSIS — Z23 Encounter for immunization: Secondary | ICD-10-CM

## 2016-05-02 DIAGNOSIS — Z79899 Other long term (current) drug therapy: Secondary | ICD-10-CM

## 2016-05-02 DIAGNOSIS — B20 Human immunodeficiency virus [HIV] disease: Secondary | ICD-10-CM

## 2016-05-02 LAB — COMPREHENSIVE METABOLIC PANEL
ALT: 9 U/L (ref 6–29)
AST: 19 U/L (ref 10–35)
Albumin: 3.9 g/dL (ref 3.6–5.1)
Alkaline Phosphatase: 100 U/L (ref 33–130)
BUN: 17 mg/dL (ref 7–25)
CALCIUM: 9 mg/dL (ref 8.6–10.4)
CO2: 28 mmol/L (ref 20–31)
Chloride: 108 mmol/L (ref 98–110)
Creat: 1.11 mg/dL — ABNORMAL HIGH (ref 0.50–1.05)
GLUCOSE: 80 mg/dL (ref 65–99)
POTASSIUM: 4.1 mmol/L (ref 3.5–5.3)
Sodium: 142 mmol/L (ref 135–146)
Total Bilirubin: 0.2 mg/dL (ref 0.2–1.2)
Total Protein: 6.8 g/dL (ref 6.1–8.1)

## 2016-05-02 LAB — LIPID PANEL
Cholesterol: 135 mg/dL (ref <200)
HDL: 47 mg/dL — AB (ref >50)
LDL CALC: 59 mg/dL (ref <100)
TRIGLYCERIDES: 143 mg/dL (ref <150)
Total CHOL/HDL Ratio: 2.9 Ratio (ref <5.0)
VLDL: 29 mg/dL (ref <30)

## 2016-05-02 LAB — CBC
HEMATOCRIT: 36.2 % (ref 35.0–45.0)
Hemoglobin: 11.4 g/dL — ABNORMAL LOW (ref 11.7–15.5)
MCH: 29.2 pg (ref 27.0–33.0)
MCHC: 31.5 g/dL — AB (ref 32.0–36.0)
MCV: 92.8 fL (ref 80.0–100.0)
MPV: 10.7 fL (ref 7.5–12.5)
Platelets: 188 10*3/uL (ref 140–400)
RBC: 3.9 MIL/uL (ref 3.80–5.10)
RDW: 13.1 % (ref 11.0–15.0)
WBC: 4.1 10*3/uL (ref 3.8–10.8)

## 2016-05-03 LAB — T-HELPER CELL (CD4) - (RCID CLINIC ONLY)
CD4 % Helper T Cell: 32 % — ABNORMAL LOW (ref 33–55)
CD4 T CELL ABS: 520 /uL (ref 400–2700)

## 2016-05-03 LAB — RPR

## 2016-05-03 LAB — URINE CYTOLOGY ANCILLARY ONLY
CHLAMYDIA, DNA PROBE: NEGATIVE
NEISSERIA GONORRHEA: NEGATIVE

## 2016-05-04 LAB — HIV-1 RNA QUANT-NO REFLEX-BLD
HIV 1 RNA Quant: 60 copies/mL — ABNORMAL HIGH
HIV-1 RNA Quant, Log: 1.78 Log copies/mL — ABNORMAL HIGH

## 2016-05-16 ENCOUNTER — Ambulatory Visit (INDEPENDENT_AMBULATORY_CARE_PROVIDER_SITE_OTHER): Payer: Self-pay | Admitting: Infectious Diseases

## 2016-05-16 ENCOUNTER — Encounter: Payer: Self-pay | Admitting: Infectious Diseases

## 2016-05-16 VITALS — BP 148/99 | HR 80 | Temp 97.6°F | Wt 167.0 lb

## 2016-05-16 DIAGNOSIS — R636 Underweight: Secondary | ICD-10-CM

## 2016-05-16 DIAGNOSIS — B20 Human immunodeficiency virus [HIV] disease: Secondary | ICD-10-CM

## 2016-05-16 DIAGNOSIS — K5909 Other constipation: Secondary | ICD-10-CM

## 2016-05-16 DIAGNOSIS — F17200 Nicotine dependence, unspecified, uncomplicated: Secondary | ICD-10-CM

## 2016-05-16 NOTE — Assessment & Plan Note (Signed)
Will have her seen by GI.  She is due for colonoscopy.

## 2016-05-16 NOTE — Progress Notes (Signed)
   Subjective:    Patient ID: [name], female    DOB: 12-28-1960, 56 y.o.   MRN: [id number]  HPI 56 yo F with HIV+ and cured Hep C (no u/s) and tobacco use. Has been on Odefsy, prior was on atripla (for convenience) since 2007 after having been successfully maintained on NFV for many years.  Last pap and mammo were (-) 06-2015 and 01-2016 respectively.    HIV 1 RNA Quant (copies/mL)  Date Value  05/02/2016 60 (H)  10/31/2015 <20  05/23/2015 <20   CD4 T Cell Abs (/uL)  Date Value  05/02/2016 520  10/31/2015 520  05/23/2015 570   No problems taking ART.  Has gained wt, BMI now over 20.  Has been seen by GYN, put on paroxitine amitiza for menopause.    Review of Systems  Constitutional: Negative for appetite change and unexpected weight change.  Respiratory: Negative for cough and shortness of breath.   Gastrointestinal: Positive for constipation. Negative for diarrhea.  Genitourinary: Negative for difficulty urinating and menstrual problem.  Neurological: Positive for headaches.  Psychiatric/Behavioral: Negative for sleep disturbance.   No menses 4-5 yrs. Hot flashes better.     Has been vaping, down to 2 cigarettes today Objective:   Physical Exam  Constitutional: She appears well-developed.  HENT:  Mouth/Throat: No oropharyngeal exudate.  Eyes: EOM are normal. Pupils are equal, round, and reactive to light.  Neck: Neck supple.  Cardiovascular: Normal rate, regular rhythm and normal heart sounds.   Pulmonary/Chest: Effort normal and breath sounds normal.  Abdominal: Soft. Bowel sounds are normal. There is no tenderness. There is no rebound.  Musculoskeletal: She exhibits no edema.  Lymphadenopathy:    She has no cervical adenopathy.  Psychiatric: She has a normal mood and affect.      Assessment & Plan:

## 2016-05-16 NOTE — Assessment & Plan Note (Signed)
Her wt has improved dramatically.  Will mark as resolved.

## 2016-05-16 NOTE — Assessment & Plan Note (Signed)
Has cut down, encouraged her to quit (she is close and committed to quitting).

## 2016-05-16 NOTE — Assessment & Plan Note (Signed)
She is doing well.  Has blip Will continue her current meds, taking with food.  Offered/refused condoms.  Will see her back in 6 months.

## 2016-06-19 ENCOUNTER — Ambulatory Visit (INDEPENDENT_AMBULATORY_CARE_PROVIDER_SITE_OTHER): Payer: Self-pay | Admitting: Family Medicine

## 2016-06-19 ENCOUNTER — Encounter: Payer: Self-pay | Admitting: Family Medicine

## 2016-06-19 VITALS — BP 143/86 | HR 74 | Temp 97.8°F | Resp 16 | Ht 72.0 in | Wt 156.0 lb

## 2016-06-19 DIAGNOSIS — F552 Abuse of laxatives: Secondary | ICD-10-CM

## 2016-06-19 DIAGNOSIS — K581 Irritable bowel syndrome with constipation: Secondary | ICD-10-CM

## 2016-06-19 DIAGNOSIS — Z23 Encounter for immunization: Secondary | ICD-10-CM

## 2016-06-19 MED ORDER — POLYETHYLENE GLYCOL 3350 17 GM/SCOOP PO POWD: 17.0000 g | Freq: Two times a day (BID) | ORAL | 1 refills | Status: DC | PRN

## 2016-06-19 MED ORDER — SENNOSIDES-DOCUSATE SODIUM 8.6-50 MG PO TABS: 1.0000 | ORAL_TABLET | Freq: Every day | ORAL | 0 refills | Status: DC

## 2016-06-19 NOTE — Patient Instructions (Addendum)
Begin Miralax daily for relief of constipation . This is okay to mix with Ensure.  This medication is available at [facility].  Discontinue to Amitiza.   Start Senokot daily which is a stool softener to help reduce your episodes of constipation.  If you haven't experienced a bowel movement by Friday, please notify me via phone.  I am ordering an abdominal ultrasound to investigate the cause of worsening constipation.   Constipation, Adult Constipation is when a person:  Poops (has a bowel movement) fewer times in a week than normal.  Has a hard time pooping.  Has poop that is dry, hard, or bigger than normal. Follow these instructions at home: Eating and drinking    Eat foods that have a lot of fiber, such as:  Fresh fruits and vegetables.  Whole grains.  Beans.  Eat less of foods that are high in fat, low in fiber, or overly processed, such as:  [place] fries.  Hamburgers.  Cookies.  Candy.  Soda.  Drink enough fluid to keep your pee (urine) clear or pale yellow. General instructions   Exercise regularly or as told by your doctor.  Go to the restroom when you feel like you need to poop. Do not hold it in.  Take over-the-counter and prescription medicines only as told by your doctor. These include any fiber supplements.  Do pelvic floor retraining exercises, such as:  Doing deep breathing while relaxing your lower belly (abdomen).  Relaxing your pelvic floor while pooping.  Watch your condition for any changes.  Keep all follow-up visits as told by your doctor. This is important. Contact a doctor if:  You have pain that gets worse.  You have a fever.  You have not pooped for 4 days.  You throw up (vomit).  You are not hungry.  You lose weight.  You are bleeding from the anus.  You have thin, pencil-like poop (stool). Get help right away if:  You have a fever, and your symptoms suddenly get worse.  You leak poop or  have blood in your poop.  Your belly feels hard or bigger than normal (is bloated).  You have very bad belly pain.  You feel dizzy or you faint. This information is not intended to replace advice given to you by your health care provider. Make sure you discuss any questions you have with your health care provider. Document Released: 07/18/2007 Document Revised: 08/19/2015 Document Reviewed: 07/20/2015 Elsevier Interactive Patient Education  2017 [employer].

## 2016-06-19 NOTE — Progress Notes (Signed)
Patient ID: [name], female    DOB: [birth year], 56 y.o.   MRN: [id number]  PCP: Scot Jun, FNP  Chief Complaint  Patient presents with  . Follow-up    3 MONTH ON IBS     Subjective:  HPI [name] is a 56 y.o. female presents for evaluation of irritable bowel syndrome. Medical problems include: Hypertension, Asthma , GERD, IBS, and HIV She was previously seen in office for chronic constipation and treated for IBS in February. She was placed on Amitiza 24 MCG and reports no imrpovement in symptoms. Over the last 3 weeks, she has only had one bowel movement which occurred with the use of laxative. Reports chronic bloating, gas, and abdominal distention. She admits to poor water intake and likely is not ingesting  Enough fiber.  Denies nausea or vomiting. She is overdue for colonoscopy and ID recently placed a referral for  her to have procedure completed.  Social History   Social History  . Marital status: Single    Spouse name: N/A  . Number of children: N/A  . Years of education: N/A   Occupational History  . Not on file.   Social History Main Topics  . Smoking status: Current Every Day Smoker    Packs/day: 0.50    Years: 33.00    Types: Cigarettes  . Smokeless tobacco: Never Used     Comment: cutting back  . Alcohol use No     Comment: recovered alcoholic for 10 years  . Drug use: No     Comment: clean for 10 years  . Sexual activity: No     Comment: pt. given condoms   Other Topics Concern  . Not on file   Social History Narrative  . No narrative on file    Family History  Problem Relation Age of Onset  . Cancer Mother     bone  . Diabetes Sister    Review of Systems HPI Patient Active Problem List   Diagnosis Date Noted  . Constipation 11/30/2014  . Essential hypertension 11/30/2014  . Hot flashes, menopausal 01/27/2014  . Asthma 01/23/2011  . HEADACHE 04/14/2008  . CHOLELITHIASIS 12/15/2007  . PERIPHERAL  NEUROPATHY 11/07/2007  . BROKEN TOOTH 07/23/2007  . Depression 07/24/2006  . Human immunodeficiency virus (HIV) disease ([place]) 12/14/2005  . 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 TOBACCO ABUSE 12/14/2005  . SUBSTANCE ABUSE 12/14/2005  . GERD 12/14/2005  . PNEUMONIA, HX OF 12/14/2005  . COLPOSCOPY, HX OF 12/14/2005    Allergies  Allergen Reactions  . Aspirin     REACTION: ? heart murmur    Prior to Admission medications   Medication Sig Start Date End Date Taking? Authorizing Provider  albuterol (PROVENTIL HFA;VENTOLIN HFA) 108 (90 BASE) MCG/ACT inhaler Inhale 2 puffs into the lungs every 4 (four) hours as needed for wheezing. 06/22/13   Luana Shu, Freeman Caldron, PA-C  ENSURE (ENSURE) Take 1 Can by mouth 2 (two) times daily between meals. 03/08/16   Campbell Riches, MD  lubiprostone (AMITIZA) 24 MCG capsule Take 1 capsule (24 mcg total) by mouth daily with breakfast. 04/17/16   Dorena Dew, FNP  ODEFSEY 200-25-25 MG TABS tablet TAKE 1 TABLET BY MOUTH DAILY WITH BREAKFAST 02/10/16   Campbell Riches, MD  PARoxetine (PAXIL) 20 MG tablet TAKE 1 TABLET BY MOUTH EVERY DAY 06/27/15   Campbell Riches, MD    Past Medical, Surgical Family and Social History reviewed and updated.    Objective:  There were no vitals filed for this visit.  Wt Readings from Last 3 Encounters:  05/16/16 167 lb (75.8 kg)  03/19/16 159 lb 6.4 oz (72.3 kg)  11/14/15 156 lb (70.8 kg)    Physical Exam  Constitutional: She is oriented to person, place, and time. She appears well-developed and well-nourished.  HENT:  Head: Normocephalic and atraumatic.  Eyes: Conjunctivae and EOM are normal. Pupils are equal, round, and reactive to light.  Neck: Normal range of motion. Neck supple.  Cardiovascular: Normal rate and regular rhythm.   Pulmonary/Chest: Effort normal and breath sounds normal.  Abdominal: She exhibits distension. Bowel sounds are decreased.  Musculoskeletal: Normal range of motion.  Neurological: She  is alert and oriented to person, place, and time.  Skin: Skin is warm.  Psychiatric: She has a normal mood and affect. Her behavior is normal. Judgment and thought content normal.     Assessment & Plan:  1. Irritable bowel syndrome with constipation -Discontinue Amitiza  -Began Miralax daily to improve regularity of bowel movement -Start daily Senokot as stool maintenance   2. Harmful use of non-dependence-producing laxative Discontinue Ex-lax and all other stimulant laxatives -Start Miralax  -Increase water intake to minimally 6-8 oz glasses of water daily  3. Need for diphtheria-tetanus-pertussis (Tdap) vaccine - Tdap vaccine greater than or equal to 7yo IM   RTC: 6 weeks follow-up IBS  Carroll Sage. Kenton Kingfisher, MSN, [facility] [address] [place], [place] [address] [phone]

## 2016-06-20 ENCOUNTER — Other Ambulatory Visit: Payer: Self-pay | Admitting: Gastroenterology

## 2016-06-20 MED FILL — POLYETHYLENE GLYCOL 3350 PO: 30 days supply | Qty: 1020 | Fill #0

## 2016-06-21 ENCOUNTER — Other Ambulatory Visit: Payer: Self-pay | Admitting: Gastroenterology

## 2016-06-21 MED FILL — PEG-3350 SOLUTION: 420 | 1 days supply | Qty: 4000 | Fill #0

## 2016-06-26 ENCOUNTER — Encounter (HOSPITAL_COMMUNITY)
Admission: RE | Disposition: A | Discharge status: Home / Self Care | Payer: Self-pay | Source: Ambulatory Visit | Attending: Gastroenterology

## 2016-06-26 ENCOUNTER — Encounter (HOSPITAL_COMMUNITY): Payer: Self-pay | Admitting: Certified Registered Nurse Anesthetist

## 2016-06-26 ENCOUNTER — Ambulatory Visit (HOSPITAL_COMMUNITY): Payer: Self-pay | Admitting: Certified Registered Nurse Anesthetist

## 2016-06-26 ENCOUNTER — Ambulatory Visit (HOSPITAL_COMMUNITY)
Admission: RE | Admit: 2016-06-26 | Discharge: 2016-06-26 | Disposition: A | Discharge status: Home / Self Care | Payer: Self-pay | Source: Ambulatory Visit | Attending: Gastroenterology | Admitting: Gastroenterology

## 2016-06-26 DIAGNOSIS — D123 Benign neoplasm of transverse colon: Secondary | ICD-10-CM | POA: Insufficient documentation

## 2016-06-26 DIAGNOSIS — K219 Gastro-esophageal reflux disease without esophagitis: Secondary | ICD-10-CM | POA: Insufficient documentation

## 2016-06-26 DIAGNOSIS — K648 Other hemorrhoids: Secondary | ICD-10-CM | POA: Insufficient documentation

## 2016-06-26 DIAGNOSIS — K562 Volvulus: Secondary | ICD-10-CM | POA: Insufficient documentation

## 2016-06-26 DIAGNOSIS — R194 Change in bowel habit: Secondary | ICD-10-CM | POA: Insufficient documentation

## 2016-06-26 DIAGNOSIS — I10 Essential (primary) hypertension: Secondary | ICD-10-CM | POA: Insufficient documentation

## 2016-06-26 DIAGNOSIS — F329 Major depressive disorder, single episode, unspecified: Secondary | ICD-10-CM | POA: Insufficient documentation

## 2016-06-26 DIAGNOSIS — F17200 Nicotine dependence, unspecified, uncomplicated: Secondary | ICD-10-CM | POA: Insufficient documentation

## 2016-06-26 DIAGNOSIS — J45909 Unspecified asthma, uncomplicated: Secondary | ICD-10-CM | POA: Insufficient documentation

## 2016-06-26 HISTORY — PX: COLONOSCOPY WITH PROPOFOL: SHX5780

## 2016-06-26 SURGERY — COLONOSCOPY WITH PROPOFOL
Anesthesia: Monitor Anesthesia Care

## 2016-06-26 MED ORDER — ONDANSETRON HCL 4 MG/2ML IJ SOLN
INTRAMUSCULAR | Status: AC
  Filled 2016-06-26: qty 2

## 2016-06-26 MED ORDER — LIDOCAINE 2% (20 MG/ML) 5 ML SYRINGE
INTRAMUSCULAR | Status: DC | PRN
  Administered 2016-06-26: 100 mg via INTRAVENOUS

## 2016-06-26 MED ORDER — SODIUM CHLORIDE 0.9 % IV SOLN: INTRAVENOUS | Status: DC

## 2016-06-26 MED ORDER — LACTATED RINGERS IV SOLN
INTRAVENOUS | Status: DC
  Administered 2016-06-26: 1000 mL via INTRAVENOUS

## 2016-06-26 MED ORDER — ONDANSETRON HCL 4 MG/2ML IJ SOLN
INTRAMUSCULAR | Status: DC | PRN
  Administered 2016-06-26: 4 mg via INTRAVENOUS

## 2016-06-26 MED ORDER — PROPOFOL 10 MG/ML IV BOLUS
INTRAVENOUS | Status: DC | PRN
  Administered 2016-06-26 (×8): 20 mg via INTRAVENOUS
  Administered 2016-06-26: 10 mg via INTRAVENOUS
  Administered 2016-06-26: 20 mg via INTRAVENOUS

## 2016-06-26 MED ORDER — PROPOFOL 10 MG/ML IV BOLUS
INTRAVENOUS | Status: AC
  Filled 2016-06-26: qty 60

## 2016-06-26 MED ORDER — LIDOCAINE 2% (20 MG/ML) 5 ML SYRINGE
INTRAMUSCULAR | Status: AC
  Filled 2016-06-26: qty 5

## 2016-06-26 MED ORDER — PROPOFOL 10 MG/ML IV BOLUS
INTRAVENOUS | Status: AC
Start: 2016-06-26 — End: 2016-06-26
  Filled 2016-06-26: qty 60

## 2016-06-26 MED ORDER — PROPOFOL 500 MG/50ML IV EMUL
INTRAVENOUS | Status: DC | PRN
  Administered 2016-06-26: 75 ug/kg/min via INTRAVENOUS

## 2016-06-26 MED ORDER — PROPOFOL 10 MG/ML IV BOLUS
INTRAVENOUS | Status: AC
  Filled 2016-06-26: qty 20

## 2016-06-26 SURGICAL SUPPLY — 22 items

## 2016-06-26 NOTE — Anesthesia Preprocedure Evaluation (Signed)
Anesthesia Evaluation  Patient identified by MRN, date of birth, ID band Patient awake    Reviewed: Allergy & Precautions, NPO status , Patient's Chart, lab work & pertinent test results  Airway Mallampati: II  TM Distance: >3 FB Neck ROM: Full    Dental no notable dental hx. (+) Dental Advisory Given   Pulmonary asthma , Current Smoker,    Pulmonary exam normal        Cardiovascular hypertension, negative cardio ROS Normal cardiovascular exam     Neuro/Psych PSYCHIATRIC DISORDERS Depression negative neurological ROS  negative psych ROS   GI/Hepatic GERD  ,(+) Hepatitis -, C  Endo/Other  negative endocrine ROS  Renal/GU negative Renal ROS  negative genitourinary   Musculoskeletal negative musculoskeletal ROS (+)   Abdominal   Peds negative pediatric ROS (+)  Hematology  (+) HIV,   Anesthesia Other Findings   Reproductive/Obstetrics negative OB ROS                             Anesthesia Physical Anesthesia Plan  ASA: III  Anesthesia Plan: MAC   Post-op Pain Management:    Induction: Intravenous  Airway Management Planned: Natural Airway  Additional Equipment:   Intra-op Plan:   Post-operative Plan:   Informed Consent: I have reviewed the patients History and Physical, chart, labs and discussed the procedure including the risks, benefits and alternatives for the proposed anesthesia with the patient or authorized representative who has indicated his/her understanding and acceptance.   Dental advisory given  Plan Discussed with: CRNA and Anesthesiologist  Anesthesia Plan Comments:         Anesthesia Quick Evaluation

## 2016-06-26 NOTE — H&P (Signed)
   [name] is a 56 y.o. female has presented with change in bowel habits and constipation.  The various methods of treatment have been discussed with the patient and family. After consideration of risks, benefits and other options for treatment, the patient has consented to  Procedure(s): colonoscopy as a surgical intervention .  The patient's history has been reviewed, patient examined, no change in status, stable for surgery.  I have reviewed the patient's chart and labs.  Questions were answered to the patient's satisfaction.

## 2016-06-26 NOTE — Transfer of Care (Signed)
Immediate Anesthesia Transfer of Care Note  Patient: [name]  Procedure(s) Performed: Procedure(s): COLONOSCOPY WITH PROPOFOL (N/A)  Patient Location: ENDO  Anesthesia Type:MAC  Level of Consciousness:  sedated, patient cooperative and responds to stimulation  Airway & Oxygen Therapy:Patient Spontanous Breathing and Patient connected to face mask oxgen  Post-op Assessment:  Report given to ENDO RN and Post -op Vital signs reviewed and stable  Post vital signs:  Reviewed and stable  Last Vitals:  Vitals:   06/26/16 1213  BP: (!) 168/98  Resp: 12  Temp: 41.0 C    Complications: No apparent anesthesia complications

## 2016-06-26 NOTE — Discharge Instructions (Signed)

## 2016-06-26 NOTE — Op Note (Signed)
[facility] Patient Name: [name] Procedure Date: 06/26/2016 MRN: [id number] Attending MD: Otis Brace , MD Date of Birth: 05-24-1960 CSN: [id number] Age: 56 Admit Type: Outpatient Procedure:                Colonoscopy Indications:              This is the patient's first colonoscopy, Change in                            bowel habits Providers:                Otis Brace, MD, Vista Lawman, RN, William Dalton, Technician, Tinnie Gens, Technician Referring MD:              Medicines:                Sedation Administered by an Anesthesia Professional Complications:            No immediate complications. Estimated Blood Loss:     Estimated blood loss was minimal. Procedure:                Pre-Anesthesia Assessment:                           - Prior to the procedure, a History and Physical                            was performed, and patient medications and                            allergies were reviewed. The patient's tolerance of                            previous anesthesia was also reviewed. The risks                            and benefits of the procedure and the sedation                            options and risks were discussed with the patient.                            All questions were answered, and informed consent                            was obtained. Prior Anticoagulants: The patient has                            taken no previous anticoagulant or antiplatelet                            agents. ASA Grade Assessment: III - A patient with  severe systemic disease. After reviewing the risks                            and benefits, the patient was deemed in                            satisfactory condition to undergo the procedure.                           After obtaining informed consent, the colonoscope                            was passed under direct vision. Throughout the                             procedure, the patient's blood pressure, pulse, and                            oxygen saturations were monitored continuously. The                            was introduced through the anus with the intention                            of advancing to the cecum. The scope was advanced                            to the transverse colon before the procedure was                            aborted. Medications were given. The colonoscopy                            was technically difficult and complex due to                            inadequate bowel prep. Successful completion of the                            procedure was aided by lavage. The colonoscopy was                            technically difficult and complex due to                            significant looping and a tortuous colon.                            Successful completion of the procedure was aided by                            changing the patient to a supine position. The  patient tolerated the procedure well. The quality                            of the bowel preparation was fair. The rectum was                            photographed. The quality of the bowel preparation                            was fair. Findings:      The perianal and digital rectal examinations were normal. The scope was       advanced to the transverse colon before the procedure was aborted.      A 4 mm polyp was found in the transverse colon. The polyp was sessile.       The polyp was removed with a cold biopsy forceps. Resection and       retrieval were complete.      The colon (entire examined portion) was significantly tortuous.       Advancing the scope required changing the patient to a supine position       and applying abdominal pressure.      Internal hemorrhoids were found during retroflexion. The hemorrhoids       were small. Impression:               - Preparation of the colon  was fair.                           - One 4 mm polyp in the transverse colon, removed                            with a cold biopsy forceps. Resected and retrieved.                           - Tortuous colon.                           - Internal hemorrhoids. Moderate Sedation:      Moderate (conscious) sedation was personally administered by an       anesthesia professional. The following parameters were monitored: oxygen       saturation, heart rate, blood pressure, and response to care. Recommendation:           - Patient has a contact number available for                            emergencies. The signs and symptoms of potential                            delayed complications were discussed with the                            patient. Return to normal activities tomorrow.                            Written discharge instructions were provided to the  patient.                           - Resume previous diet.                           - Continue present medications.                           - Await pathology results.                           - Repeat colonoscopy based on virtual colonoscopy                            finding because the examination was incomplete.                           - Return to my office as previously scheduled.                           - Perform a virtual colonoscopy at appointment to                            be scheduled. Procedure Code(s):        --- Professional ---                           [id number], 46, Colonoscopy, flexible; with biopsy,                            single or multiple Diagnosis Code(s):        --- Professional ---                           K64.8, Other hemorrhoids                           D12.3, Benign neoplasm of transverse colon (hepatic                            flexure or splenic flexure)                           R19.4, Change in bowel habit                           Q43.8, Other specified congenital  malformations of                            intestine CPT copyright 2016 American Medical Association. All rights reserved. The codes documented in this report are preliminary and upon coder review may  be revised to meet current compliance requirements. Otis Brace, MD Otis Brace, MD 06/26/2016 1:51:50 PM Number of Addenda: 0

## 2016-06-26 NOTE — Anesthesia Postprocedure Evaluation (Addendum)
Anesthesia Post Note  Patient: [name]  Procedure(s) Performed: Procedure(s) (LRB): COLONOSCOPY WITH PROPOFOL (N/A)  Patient location during evaluation: Endoscopy Anesthesia Type: MAC Level of consciousness: awake and alert Pain management: pain level controlled Vital Signs Assessment: post-procedure vital signs reviewed and stable Respiratory status: spontaneous breathing and respiratory function stable Cardiovascular status: stable Anesthetic complications: no       Last Vitals:  Vitals:   06/26/16 1405 06/26/16 1410  BP:  (!) 171/103  Pulse: (!) 56 61  Resp: 10 14  Temp:      Last Pain:  Vitals:   06/26/16 1352  TempSrc: Oral                 Floyd Lusignan DANIEL

## 2016-06-27 ENCOUNTER — Encounter (HOSPITAL_COMMUNITY): Payer: Self-pay | Admitting: Gastroenterology

## 2016-06-28 ENCOUNTER — Other Ambulatory Visit: Payer: Self-pay | Admitting: Gastroenterology

## 2016-06-28 DIAGNOSIS — Z86010 Personal history of colonic polyps: Secondary | ICD-10-CM

## 2016-06-29 ENCOUNTER — Other Ambulatory Visit: Payer: Self-pay | Admitting: Infectious Diseases

## 2016-07-02 ENCOUNTER — Telehealth: Payer: Self-pay | Admitting: *Deleted

## 2016-07-02 NOTE — Telephone Encounter (Signed)
Call from pharmacist stating interaction between omeprazole and Odefsey. Spoke to [place], Software engineer and this is correct. Attempted to call back and had to hang up after long wait. Omeprazole is no longer on the patient's med list. Tried to call patient and no answer and no voice mail. If patient is still taking it she should be scheduled with pharmacy to change meds. Myrtis Hopping

## 2016-07-06 ENCOUNTER — Ambulatory Visit
Admission: RE | Admit: 2016-07-06 | Discharge: 2016-07-06 | Disposition: A | Discharge status: Home / Self Care | Payer: No Typology Code available for payment source | Source: Ambulatory Visit | Attending: Gastroenterology | Admitting: Gastroenterology

## 2016-07-06 DIAGNOSIS — Z86010 Personal history of colonic polyps: Secondary | ICD-10-CM

## 2016-07-10 ENCOUNTER — Other Ambulatory Visit: Payer: Self-pay | Admitting: Gastroenterology

## 2016-07-10 DIAGNOSIS — N281 Cyst of kidney, acquired: Secondary | ICD-10-CM

## 2016-07-13 ENCOUNTER — Inpatient Hospital Stay
Admission: RE | Admit: 2016-07-13 | Discharge: 2016-07-13 | Disposition: A | Discharge status: Home / Self Care | Payer: No Typology Code available for payment source | Source: Ambulatory Visit | Attending: Gastroenterology | Admitting: Gastroenterology

## 2016-07-14 NOTE — Addendum Note (Signed)
Addendum  created 07/14/16 1007 by Toshiro Hanken, MD   Sign clinical note    

## 2016-07-26 ENCOUNTER — Ambulatory Visit
Admission: RE | Admit: 2016-07-26 | Discharge: 2016-07-26 | Disposition: A | Discharge status: Home / Self Care | Payer: No Typology Code available for payment source | Source: Ambulatory Visit | Attending: Gastroenterology | Admitting: Gastroenterology

## 2016-07-26 DIAGNOSIS — N281 Cyst of kidney, acquired: Secondary | ICD-10-CM

## 2016-08-01 ENCOUNTER — Other Ambulatory Visit: Payer: Self-pay

## 2016-08-01 ENCOUNTER — Other Ambulatory Visit: Payer: Self-pay | Admitting: Infectious Diseases

## 2016-08-01 DIAGNOSIS — N289 Disorder of kidney and ureter, unspecified: Secondary | ICD-10-CM

## 2016-08-07 ENCOUNTER — Ambulatory Visit: Payer: No Typology Code available for payment source | Admitting: Family Medicine

## 2016-08-13 ENCOUNTER — Ambulatory Visit: Payer: No Typology Code available for payment source

## 2016-08-14 ENCOUNTER — Ambulatory Visit
Admission: RE | Admit: 2016-08-14 | Discharge: 2016-08-14 | Disposition: A | Discharge status: Home / Self Care | Payer: No Typology Code available for payment source | Source: Ambulatory Visit | Attending: Infectious Diseases | Admitting: Infectious Diseases

## 2016-08-14 ENCOUNTER — Encounter: Payer: Self-pay | Admitting: Infectious Diseases

## 2016-08-14 DIAGNOSIS — N289 Disorder of kidney and ureter, unspecified: Secondary | ICD-10-CM

## 2016-08-14 DIAGNOSIS — Q6102 Congenital multiple renal cysts: Secondary | ICD-10-CM | POA: Insufficient documentation

## 2016-08-14 MED ORDER — GADOBENATE DIMEGLUMINE 529 MG/ML IV SOLN
14.0000 mL | Freq: Once | INTRAVENOUS | Status: AC | PRN
  Administered 2016-08-14: 14 mL via INTRAVENOUS
  Administered 2016-08-14: 15 mL via INTRAVENOUS

## 2016-09-18 ENCOUNTER — Other Ambulatory Visit: Payer: Self-pay | Admitting: Infectious Diseases

## 2016-09-18 DIAGNOSIS — B20 Human immunodeficiency virus [HIV] disease: Secondary | ICD-10-CM

## 2016-09-19 ENCOUNTER — Emergency Department (HOSPITAL_COMMUNITY)
Admission: EM | Admit: 2016-09-19 | Discharge: 2016-09-19 | Disposition: A | Discharge status: Home / Self Care | Payer: No Typology Code available for payment source | Attending: Emergency Medicine | Admitting: Emergency Medicine

## 2016-09-19 ENCOUNTER — Emergency Department (HOSPITAL_COMMUNITY): Payer: No Typology Code available for payment source

## 2016-09-19 ENCOUNTER — Encounter (HOSPITAL_COMMUNITY): Payer: Self-pay | Admitting: Emergency Medicine

## 2016-09-19 DIAGNOSIS — S80211A Abrasion, right knee, initial encounter: Secondary | ICD-10-CM | POA: Insufficient documentation

## 2016-09-19 DIAGNOSIS — W19XXXA Unspecified fall, initial encounter: Secondary | ICD-10-CM

## 2016-09-19 DIAGNOSIS — W101XXA Fall (on)(from) sidewalk curb, initial encounter: Secondary | ICD-10-CM | POA: Insufficient documentation

## 2016-09-19 DIAGNOSIS — F17210 Nicotine dependence, cigarettes, uncomplicated: Secondary | ICD-10-CM | POA: Insufficient documentation

## 2016-09-19 DIAGNOSIS — Z79899 Other long term (current) drug therapy: Secondary | ICD-10-CM | POA: Insufficient documentation

## 2016-09-19 DIAGNOSIS — M25562 Pain in left knee: Secondary | ICD-10-CM | POA: Insufficient documentation

## 2016-09-19 DIAGNOSIS — Y9389 Activity, other specified: Secondary | ICD-10-CM | POA: Insufficient documentation

## 2016-09-19 DIAGNOSIS — M545 Low back pain, unspecified: Secondary | ICD-10-CM

## 2016-09-19 DIAGNOSIS — Y929 Unspecified place or not applicable: Secondary | ICD-10-CM | POA: Insufficient documentation

## 2016-09-19 DIAGNOSIS — B20 Human immunodeficiency virus [HIV] disease: Secondary | ICD-10-CM | POA: Insufficient documentation

## 2016-09-19 DIAGNOSIS — M25561 Pain in right knee: Secondary | ICD-10-CM | POA: Insufficient documentation

## 2016-09-19 DIAGNOSIS — Y999 Unspecified external cause status: Secondary | ICD-10-CM | POA: Insufficient documentation

## 2016-09-19 DIAGNOSIS — J45909 Unspecified asthma, uncomplicated: Secondary | ICD-10-CM | POA: Insufficient documentation

## 2016-09-19 MED ORDER — IBUPROFEN 800 MG PO TABS
800.0000 mg | ORAL_TABLET | Freq: Once | ORAL | Status: AC
  Administered 2016-09-19: 800 mg via ORAL
  Filled 2016-09-19: qty 1

## 2016-09-19 MED ORDER — CYCLOBENZAPRINE HCL 10 MG PO TABS: 10.0000 mg | ORAL_TABLET | Freq: Two times a day (BID) | ORAL | 0 refills | Status: DC | PRN

## 2016-09-19 NOTE — ED Provider Notes (Signed)
[facility] DEPT Provider Note   CSN: [id number] Arrival date & time: 09/19/16  1418  By signing my name below, I, Margit Banda, attest that this documentation has been prepared under the direction and in the presence of Jaia Alonge A. Vaibhav Fogleman, PA-C. Electronically Signed: Margit Banda, ED Scribe. 09/19/16. 6:29 PM.  History   Chief Complaint Chief Complaint  Patient presents with  . Fall    HPI [name] is a 57 y.o. female with a PMHx of HIV, who presents to the Emergency Department complaining of bilateral knee pain, L>R s/p tripping and falling off a curb Saturday, 09/15/16. Associated sx include lower back pain. She notes falling forward, and denies hitting her back when falling. States she "jarred her back." Walking exacerbates her knee pain. Pt is not currently on anticoagulant or antiplatelet therapy. No past back or knee surgeries. Pt has had broken bones in the past, but states her discomfort doesn't feel like that. Pt denies head injury, nausea, vomiting, bilateral leg weakness, numbness, tingling, incontinence or any other complaints at this time.   The history is provided by the patient. No language interpreter was used.    Past Medical History:  Diagnosis Date  . Asthma   . Hepatitis C   . HIV (human immunodeficiency virus infection) [facility])     Patient Active Problem List   Diagnosis Date Noted  . Multiple renal cysts 08/14/2016  . Constipation 11/30/2014  . Essential hypertension 11/30/2014  . Hot flashes, menopausal 01/27/2014  . Asthma 01/23/2011  . HEADACHE 04/14/2008  . CHOLELITHIASIS 12/15/2007  . PERIPHERAL NEUROPATHY 11/07/2007  . BROKEN TOOTH 07/23/2007  . Depression 07/24/2006  . Human immunodeficiency virus (HIV) disease ([place]) 12/14/2005  . 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 TOBACCO ABUSE 12/14/2005  . SUBSTANCE ABUSE 12/14/2005  . GERD 12/14/2005  . PNEUMONIA, HX OF 12/14/2005  . COLPOSCOPY, HX OF 12/14/2005    Past Surgical History:    Procedure Laterality Date  . COLONOSCOPY WITH PROPOFOL N/A 06/26/2016   Procedure: COLONOSCOPY WITH PROPOFOL;  Surgeon: Otis Brace, MD;  Location: [facility] ENDOSCOPY;  Service: Gastroenterology;  Laterality: N/A;    OB History    Gravida Para Term Preterm AB Living   4 2 2   2 2    SAB TAB Ectopic Multiple Live Births     2             Home Medications    Prior to Admission medications   Medication Sig Start Date End Date Taking? Authorizing Provider  cetirizine (ZYRTEC) 10 MG tablet Take 10 mg by mouth daily as needed for allergies.    [provider]  cyclobenzaprine (FLEXERIL) 10 MG tablet Take 1 tablet (10 mg total) by mouth 2 (two) times daily as needed for muscle spasms. 09/19/16   Reyan Helle A, PA-C  ENSURE (ENSURE) Take 1 Can by mouth 2 (two) times daily between meals. Patient taking differently: Take 1 Can by mouth See admin instructions. Drink one can once to twice daily 03/08/16   Campbell Riches, MD  linaclotide [facility]) 145 MCG CAPS capsule Take 145 mcg by mouth daily before breakfast.    [provider]  linaclotide (LINZESS) 72 MCG capsule Take 72 mcg by mouth daily before breakfast.    [provider]  naproxen sodium (ANAPROX) 220 MG tablet Take 220 mg by mouth 2 (two) times daily as needed (for pain.).    [provider]  ODEFSEY 200-25-25 MG TABS tablet TAKE 1 TABLET BY MOUTH DAILY  WITH BREAKFAST 09/18/16   Campbell Riches, MD  PARoxetine (PAXIL) 20 MG tablet TAKE 1 TABLET BY MOUTH EVERY DAY 06/29/16   Campbell Riches, MD  Polyethyl Glycol-Propyl Glycol (SYSTANE ULTRA) 0.4-0.3 % SOLN Place 1-2 drops into both eyes 3 (three) times daily as needed (for dry/irritated eyes.).    [provider]  polyethylene glycol powder (GLYCOLAX/MIRALAX) powder Take 17 g by mouth 2 (two) times daily as needed. Patient taking differently: Take 17 g by mouth 2 (two) times daily as needed (FOR CONSTIPATION).  06/19/16   Scot Jun,  FNP    Family History Family History  Problem Relation Age of Onset  . Cancer Mother        bone  . Diabetes Sister     Social History Social History  Substance Use Topics  . Smoking status: Current Every Day Smoker    Packs/day: 0.50    Years: 33.00    Types: Cigarettes  . Smokeless tobacco: Never Used     Comment: cutting back  . Alcohol use No     Comment: recovered alcoholic for 10 years     Allergies   Aspirin   Review of Systems Review of Systems  Constitutional: Negative for activity change.  Respiratory: Negative for shortness of breath.   Cardiovascular: Negative for chest pain.  Gastrointestinal: Negative for abdominal pain, nausea and vomiting.       - bowel incontinence  Genitourinary:       - bladder incontinence  Musculoskeletal: Positive for arthralgias and back pain.  Skin: Negative for rash.  Neurological: Negative for numbness.     Physical Exam Updated Vital Signs LMP 03/27/2011   Physical Exam  Constitutional: No distress.  HENT:  Head: Normocephalic.  Eyes: Conjunctivae are normal.  Neck: Neck supple.  Cardiovascular: Normal rate and regular rhythm.  Exam reveals no gallop and no friction rub.   No murmur heard. Pulmonary/Chest: Effort normal. No respiratory distress.  Abdominal: Soft. She exhibits no distension.  Musculoskeletal:  1 cm X 0.5 cm superficial abrasion to the skin overlying the right patella. TTP over the  spinous processus of the lumbar spine. No tenderness to palpation of the cervical or thoracic spinous processus or surrounding paraspinal muscles. Patient is able to ambulate without difficulty. Bilateral hips and knees are non-tender with FROM. 2+ DP and PT pulses. 5/5 strength of the bilateral lower extremities. No sensory deficits. Full active and passive of ROM of the bilateral knees. Increased pain with extension of the right knee. No TTP of the lateral joint line of the left knee. No medial joint line tenderness.  Patella tracks well. Negative anterior and posterior drawer test. No valgus or varus instability. DP and PT pulses are 2+ bilaterally. NVI. Patellar DTRs are 2+ bilaterally. NVI. Antalgic gait.    Neurological: She is alert.  Skin: Skin is warm. No rash noted.  Psychiatric: Her behavior is normal.  Nursing note and vitals reviewed.    ED Treatments / Results  COORDINATION OF CARE: 6:29 PM-Discussed next steps with pt which includes bilateral knee and lower back XR's. She will be given ibuprofen for pain. And she is to follow up with her PCP if she sx worsen or change. Pt verbalized understanding and is agreeable with the plan.   Labs (all labs ordered are listed, but only abnormal results are displayed) Labs Reviewed - No data to display  EKG  EKG Interpretation None       Radiology Dg Lumbar Spine  Complete  Result Date: 09/19/2016 CLINICAL DATA:  Pt. Fell while walking on pavement 4 days ago. Pain left knee - medial aspect. Pain right knee with abrasion on anterior distal knee Pain lower lumbar spine. EXAM: LUMBAR SPINE - COMPLETE 4+ VIEW COMPARISON:  None. FINDINGS: There is no evidence of lumbar spine fracture. Alignment is normal. Intervertebral disc spaces are maintained. There is mild atherosclerosis of the abdominal aorta. IMPRESSION: No evidence for acute  abnormality. Electronically Signed   By: Nolon Nations M.D.   On: 09/19/2016 20:07   Dg Knee Complete 4 Views Left  Result Date: 09/19/2016 CLINICAL DATA:  Pt. Fell while walking on pavement 4 days ago. Pain left knee - medial aspect. Pain right knee with abrasion on anterior distal knee Pain lower lumbar spine. EXAM: LEFT KNEE - COMPLETE 4+ VIEW COMPARISON:  None. FINDINGS: No evidence of fracture, dislocation, or joint effusion. No evidence of arthropathy or other focal bone abnormality. Soft tissues are unremarkable. IMPRESSION: Negative. Electronically Signed   By: Nolon Nations M.D.   On: 09/19/2016 20:04   Dg Knee  Complete 4 Views Right  Result Date: 09/19/2016 CLINICAL DATA:  Pt. Fell while walking on pavement 4 days ago. Pain left knee - medial aspect. Pain right knee with abrasion on anterior distal knee Pain lower lumbar spine. EXAM: RIGHT KNEE - COMPLETE 4+ VIEW COMPARISON:  None. FINDINGS: No evidence of fracture, dislocation, or joint effusion. No evidence of arthropathy or other focal bone abnormality. Soft tissues are unremarkable. IMPRESSION: Negative. Electronically Signed   By: Nolon Nations M.D.   On: 09/19/2016 20:05    Procedures Procedures (including critical care time)  Medications Ordered in ED Medications  ibuprofen (ADVIL,MOTRIN) tablet 800 mg (800 mg Oral Given 09/19/16 1833)     Initial Impression / Assessment and Plan / ED Course  I have reviewed the triage vital signs and the nursing notes.  Pertinent labs & imaging results that were available during my care of the patient were reviewed by me and considered in my medical decision making (see chart for details).     Patient with back and knee pain, mechanical in nature s/p fall.  No acute pathology noted on X-rays. No neurological deficits and normal neuro exam.  Patient can walk but states it is painful.  No urinary or bowel incontinence.  No concern for cauda equina, infection, AAA, infection, or fracture.  No constitutional symptoms including fever, chills, or weight loss,  No h/o cancer, IVDU.  Will discharge to home with RICE protocol and Tylenol. Discussed ED return precaution and indications for PCP follow up. The patient acknowledges the plan and is agreeable at this time.   Final Clinical Impressions(s) / ED Diagnoses   Final diagnoses:  Fall, initial encounter  Acute pain of both knees  Acute midline low back pain without sciatica    New Prescriptions New Prescriptions   CYCLOBENZAPRINE (FLEXERIL) 10 MG TABLET    Take 1 tablet (10 mg total) by mouth 2 (two) times daily as needed for muscle spasms.   I  personally performed the services described in this documentation, which was scribed in my presence. The recorded information has been reviewed and is accurate.     Joline Maxcy A, PA-C 09/19/16 2034    Orlie Dakin, MD 09/19/16 2245

## 2016-09-19 NOTE — ED Notes (Signed)
See EDP assessment 

## 2016-09-19 NOTE — ED Triage Notes (Signed)
PT fell Saturday- tripped off curb; right knee and left knee hurts. Lower back hurting now. No signs of distress.

## 2016-09-19 NOTE — Discharge Instructions (Signed)
Please return to the emergency department if symptoms worsen or if he develops new symptoms. Please do not drive or go to work after taking Flexeril. You can take 650 mg of Tylenol every 6 hours as needed for pain control. Please don't take anti-inflammatories like ibuprofen because they can decrease your kidney functions when taken with some of your other medications.  Muscle strains can take some time to heal. If symptoms persist for more than one week, you can call the number to get established with a primary care provider for follow-up. Here are some stretches that may help the muscles in your lower back.   Stretches for Low Back Pain      Back Flexion Stretch. Lying on the back, pull both knees to the chest while simultaneously flexing the head forward until a comfortable stretch is felt across the mid and low back.      Knee to Chest Stretch. Lie on the back with the knees bent and both heels on the floor, then place both hands behind one knee and pull it toward the chest, stretching the gluteus and piriformis muscles in the buttock.  Kneeling Lunge Stretch. Starting on both knees, move one leg forward so the foot is flat on the ground, keeping weight evenly distributed through both hips (rather than on one side or the other). Place both hands on the top of the thigh, and gently lean the body forward to feel a stretch in the front of the other leg. This stretch affects the hip flexor muscles, which attach to the pelvis and can impact posture if too tight.    Piriformis Muscle Stretch. Lie on the back with knees bent and both heels on the floor. Cross one leg over the other, resting the ankle on the bent knee, then gently pull the bottom knee toward the chest until a stretch is felt in the buttock. Or, lying on the floor, cross one leg over the other and pull it forward over the body at the knee, keeping the other leg flat.

## 2016-11-01 ENCOUNTER — Other Ambulatory Visit: Payer: No Typology Code available for payment source

## 2016-11-01 DIAGNOSIS — B20 Human immunodeficiency virus [HIV] disease: Secondary | ICD-10-CM

## 2016-11-02 LAB — T-HELPER CELL (CD4) - (RCID CLINIC ONLY)
CD4 T CELL ABS: 490 /uL (ref 400–2700)
CD4 T CELL HELPER: 28 % — AB (ref 33–55)

## 2016-11-05 LAB — HIV-1 RNA QUANT-NO REFLEX-BLD
HIV 1 RNA QUANT: NOT DETECTED {copies}/mL
HIV-1 RNA QUANT, LOG: NOT DETECTED {Log_copies}/mL

## 2016-11-06 ENCOUNTER — Encounter: Payer: Self-pay | Admitting: Infectious Diseases

## 2016-11-15 ENCOUNTER — Ambulatory Visit: Payer: No Typology Code available for payment source | Admitting: Infectious Diseases

## 2016-12-12 ENCOUNTER — Ambulatory Visit (INDEPENDENT_AMBULATORY_CARE_PROVIDER_SITE_OTHER): Payer: Self-pay | Admitting: Infectious Diseases

## 2016-12-12 ENCOUNTER — Encounter: Payer: Self-pay | Admitting: Infectious Diseases

## 2016-12-12 VITALS — BP 114/83 | HR 89 | Temp 98.5°F | Ht 72.0 in | Wt 158.0 lb

## 2016-12-12 DIAGNOSIS — K5909 Other constipation: Secondary | ICD-10-CM

## 2016-12-12 DIAGNOSIS — F17200 Nicotine dependence, unspecified, uncomplicated: Secondary | ICD-10-CM

## 2016-12-12 DIAGNOSIS — Z113 Encounter for screening for infections with a predominantly sexual mode of transmission: Secondary | ICD-10-CM

## 2016-12-12 DIAGNOSIS — Z23 Encounter for immunization: Secondary | ICD-10-CM

## 2016-12-12 DIAGNOSIS — Z79899 Other long term (current) drug therapy: Secondary | ICD-10-CM

## 2016-12-12 DIAGNOSIS — B20 Human immunodeficiency virus [HIV] disease: Secondary | ICD-10-CM

## 2016-12-12 DIAGNOSIS — L84 Corns and callosities: Secondary | ICD-10-CM | POA: Insufficient documentation

## 2016-12-12 NOTE — Progress Notes (Signed)
   Subjective:    Patient ID: [name], female    DOB: 1960-09-01, 56 y.o.   MRN: [id number]  HPI 56yo F with HIV+ and cured Hep C (no u/s) and tobacco use. Has been on Odefsy, prior was on atripla (for convenience) since 2007 after having been successfully maintained on Nelfinavir for many years.  Last pap and mammo were (-) 5-2017and 01-2016 respectively. Was told she needs PAP every 5 years by her provider. She had colon 06-2016 (single tubular adenoma, hemorrhoids).  Wants to have foot surgery- has calluses, hurt all the time, flat.  No problems with her odefsy, no missed.  Still smoking.   HIV 1 RNA Quant (copies/mL)  Date Value  11/01/2016 <20 NOT DETECTED  05/02/2016 60 (H)  10/31/2015 <20   CD4 T Cell Abs (/uL)  Date Value  11/01/2016 490  05/02/2016 520  10/31/2015 520    Review of Systems  Constitutional: Negative for appetite change, chills, fever and unexpected weight change.  Gastrointestinal: Positive for constipation. Negative for diarrhea.  Genitourinary: Negative for difficulty urinating and menstrual problem.  Psychiatric/Behavioral: Negative for dysphoric mood and sleep disturbance.  has R groin pain with laying down. No swelling. Not related to movement. Feels like a "charlie horse".  Using samples for constipation (linzess).  Please see HPI. All other systems reviewed and negative.     Objective:   Physical Exam  Constitutional: She appears well-developed and well-nourished.  HENT:  Mouth/Throat: No oropharyngeal exudate.  Eyes: Pupils are equal, round, and reactive to light. EOM are normal.  Neck: Neck supple.  Cardiovascular: Normal rate, regular rhythm and normal heart sounds.   Pulmonary/Chest: Effort normal and breath sounds normal.  Abdominal: Soft. Bowel sounds are normal. There is no tenderness. There is no rebound.  Musculoskeletal: She exhibits no edema.  Lymphadenopathy:    She has no cervical adenopathy.       Assessment  & Plan:

## 2016-12-12 NOTE — Assessment & Plan Note (Signed)
Will have her seen by podiatry 

## 2016-12-12 NOTE — Assessment & Plan Note (Signed)
Have encouraged her to quit.

## 2016-12-12 NOTE — Assessment & Plan Note (Addendum)
She is doing well Flu shot today, other vax uptodate.  Continue odefsy She defers pap/mammo til 2019 rtc in 6 months

## 2016-12-12 NOTE — Assessment & Plan Note (Signed)
She will f/u with GI for more samples

## 2016-12-21 ENCOUNTER — Ambulatory Visit: Payer: No Typology Code available for payment source | Admitting: Podiatry

## 2017-01-01 ENCOUNTER — Encounter: Payer: Self-pay | Admitting: Podiatry

## 2017-01-01 ENCOUNTER — Ambulatory Visit: Payer: No Typology Code available for payment source

## 2017-01-01 ENCOUNTER — Ambulatory Visit (HOSPITAL_COMMUNITY)
Admission: EM | Admit: 2017-01-01 | Discharge: 2017-01-01 | Disposition: A | Discharge status: Home / Self Care | Payer: Self-pay

## 2017-01-01 ENCOUNTER — Telehealth: Payer: Self-pay | Admitting: *Deleted

## 2017-01-01 ENCOUNTER — Encounter (HOSPITAL_COMMUNITY): Payer: Self-pay | Admitting: Emergency Medicine

## 2017-01-01 ENCOUNTER — Ambulatory Visit: Payer: No Typology Code available for payment source | Admitting: Podiatry

## 2017-01-01 DIAGNOSIS — M722 Plantar fascial fibromatosis: Secondary | ICD-10-CM

## 2017-01-01 DIAGNOSIS — S61212A Laceration without foreign body of right middle finger without damage to nail, initial encounter: Secondary | ICD-10-CM

## 2017-01-01 DIAGNOSIS — M7750 Other enthesopathy of unspecified foot: Secondary | ICD-10-CM

## 2017-01-01 NOTE — Progress Notes (Signed)
Subjective:  Patient ID: [name], female    DOB: [birth year],  MRN: [id number] HPI Chief Complaint  Patient presents with  . Foot Pain    Patient states her feet hurt all over - multiple callused areas plantarly, 5th toe right is discolored and developing corn-rubs shoes, sharp pains throught bottoms of both feet, AM pain, on feet all day at work    56 y.o. female presents with the above complaint.     Past Medical History:  Diagnosis Date  . Asthma   . Hepatitis C   . HIV (human immunodeficiency virus infection) ([place])    Past Surgical History:  Procedure Laterality Date  . COLONOSCOPY WITH PROPOFOL N/A 06/26/2016   Procedure: COLONOSCOPY WITH PROPOFOL;  Surgeon: Otis Brace, MD;  Location: [facility] END[facility];  Service: Gastroenterology;  Laterality: N/A;    Current Outpatient Medications:  .  cetirizine (ZYRTEC) 10 MG tablet, Take 10 mg by mouth daily as needed for allergies., Disp: , Rfl:  .  cyclobenzaprine (FLEXERIL) 10 MG tablet, Take 1 tablet (10 mg total) by mouth 2 (two) times daily as needed for muscle spasms., Disp: 20 tablet, Rfl: 0 .  ENSURE (ENSURE), Take 1 Can by mouth 2 (two) times daily between meals. (Patient taking differently: Take 1 Can by mouth See admin instructions. Drink one can once to twice daily), Disp: 237 mL, Rfl: 11 .  linaclotide (LINZESS) 145 MCG CAPS capsule, Take 145 mcg by mouth daily before breakfast., Disp: , Rfl:  .  linaclotide (LINZESS) 72 MCG capsule, Take 72 mcg by mouth daily before breakfast., Disp: , Rfl:  .  naproxen sodium (ANAPROX) 220 MG tablet, Take 220 mg by mouth 2 (two) times daily as needed (for pain.)., Disp: , Rfl:  .  ODEFSEY 200-25-25 MG TABS tablet, TAKE 1 TABLET BY MOUTH DAILY WITH BREAKFAST, Disp: 30 tablet, Rfl: 5 .  omeprazole (PRILOSEC) 40 MG capsule, TK 1 C PO QD, Disp: , Rfl: 0 .  PARoxetine (PAXIL) 20 MG tablet, TAKE 1 TABLET BY MOUTH EVERY DAY (Patient not taking: Reported on 12/12/2016), Disp: 90  tablet, Rfl: 1 .  Polyethyl Glycol-Propyl Glycol (SYSTANE ULTRA) 0.4-0.3 % SOLN, Place 1-2 drops into both eyes 3 (three) times daily as needed (for dry/irritated eyes.)., Disp: , Rfl:  .  polyethylene glycol powder (GLYCOLAX/MIRALAX) powder, Take 17 g by mouth 2 (two) times daily as needed. (Patient not taking: Reported on 12/12/2016), Disp: 3350 g, Rfl: 1  Allergies  Allergen Reactions  . Aspirin Other (See Comments)    heart murmur   Review of Systems  All other systems reviewed and are negative.  Objective:  There were no vitals filed for this visit.  General: Well developed, nourished, in no acute distress, alert and oriented x3   Dermatological: Skin is warm, dry and supple bilateral. Nails x 10 are well maintained; remaining integument appears unremarkable at this time. There are no open sores, no preulcerative lesions, no rash or signs of infection present. Frank hyperkeratosis plantar aspect bilateral foot lesions or wounds are noted.  Vascular: Dorsalis Pedis artery and Posterior Tibial artery pedal pulses are 2/4 bilateral with immedate capillary fill time. Pedal hair growth present. No varicosities and no lower extremity edema present bilateral.   Neruologic: Grossly intact via light touch bilateral. Vibratory intact via tuning fork bilateral. Protective threshold with Semmes Wienstein monofilament intact to all pedal sites bilateral. Patellar and Achilles deep tendon reflexes 2+ bilateral. No Babinski or clonus noted bilateral.   Musculoskeletal: No  gross boney pedal deformities bilateral. No pain, crepitus, or limitation noted with foot and ankle range of motion bilateral. Muscular strength 5/5 in all groups tested bilateral. Adductor varus rotated hammertoe deformity fifth right with capsulitis at the level of PIPJ. She also has hallux valgus deformities bilateral. Pes planus flexible in nature and pain on palpation medial calcaneal tubercles bilateral.  Gait: Unassisted,  Nonantalgic.    Radiographs:  Radiographs 3 views bilaterally taken today demonstrates plantar distally oriented tails for soft tissue increase in density at the plantar fascia insertion site indicative of plantar fasciitis. Pes planus is also noted.  Assessment & Plan:   Assessment: Plantar fasciitis bilateral hallux abductovalgus deformities hammertoe deformity fifth right multiple callosities plantar aspect of bilateral foot  Plan: Debridement and reactive hyperkeratosis bilateral. Injected the bilateral heels today injected the fifth digit of the right foot. Discussed the need for possible surgical intervention. Wrote a note so that she were able to stand on rubber mats at work. Discussed proper shoe gear stretching shows and iced it.     Eulogio Requena T. Leaf, [place]

## 2017-01-01 NOTE — ED Provider Notes (Signed)
01/01/2017 6:51 PM   DOB: 19-Sep-1960 / MRN: [id number]  SUBJECTIVE:  [name] is a 56 y.o. female presenting for hand laceration to the finger that occurred at work.  Tells me she cut it on a metal pot while working.  Denies weakness.  Has had some trouble with hemostasis prior to the injury.   Immunization History  Administered Date(s) Administered  . Hepatitis A 03/06/2002  . Hepatitis B 01/05/2003, 02/04/2003, 07/01/2003  . Influenza Split 12/01/2010, 12/05/2011  . Influenza Whole 10/24/2005, 11/07/2007, 12/01/2008, 11/30/2009  . Influenza,inj,Quad PF,6+ Mos 12/31/2012, 09/30/2013, 11/30/2014, 11/14/2015, 12/12/2016  . Pneumococcal Polysaccharide-23 03/14/2010, 11/14/2015  . Tdap 06/19/2016     She is allergic to aspirin.   She  has a past medical history of Asthma, Hepatitis C, and HIV (human immunodeficiency virus infection) ([place]).    She  reports that she has been smoking cigarettes.  She has a 16.50 pack-year smoking history. she has never used smokeless tobacco. She reports that she does not drink alcohol or use drugs. She  reports that she does not engage in sexual activity. The patient  has a past surgical history that includes Colonoscopy with propofol (N/A, 06/26/2016).  Her family history includes Cancer in her mother; Diabetes in her sister.  Review of Systems  Musculoskeletal: Negative for myalgias.  Neurological: Negative for tingling and focal weakness.    OBJECTIVE:  BP (!) 162/99 (BP Location: Left Arm)   Pulse 80   Temp 98.2 F (36.8 C) (Oral)   Resp 18   LMP 03/27/2011   SpO2 100%   Physical Exam  Constitutional: She is active.  Non-toxic appearance.  Cardiovascular: Normal rate.  Pulmonary/Chest: Effort normal. No tachypnea.  Musculoskeletal:       Hands: Neurological: She is alert.  Skin: Skin is warm and dry. She is not diaphoretic. No pallor.     Risk and benefits discussed and verbal consent obtained. Anesthetic allergies  reviewed. Patient anesthetized using 1:1 mix of 2% lidocaine with epi and Marcaine. The wound was cleansed thoroughly with soap and water. Sterile prep and drape. Wound closed with 4 HM throws using 4-0 disolvable suture material. Hemostasis achieved. Mupirocin applied to the wound and bandage placed. The patient tolerated well. Wound instructions were provided and the patient is to return as needed.    ASSESSMENT AND PLAN:  The encounter diagnosis was Laceration of right middle finger without damage to nail, foreign body presence unspecified, initial encounter. I did advise her to seek the care of a worker compensation medical office for documentation further documentation of this injury and provided a note stating such.  She will provide that to her HR department for further direction.     The patient is advised to call or return to clinic if she does not see an improvement in symptoms, or to seek the care of the closest emergency department if she worsens with the above plan.   Philis Fendt, MHS, PA-C 01/01/2017 6:51 PM   Tereasa Coop, PA-C 01/01/17 1853

## 2017-01-01 NOTE — Telephone Encounter (Signed)
Pt presents to front reception for her appt, stating she cut her finger at work and she can't get it to stop bleeding. I took pt to an exam room to establish what had happened, then I took pt to the nurses station and assisted pt to wash her hands and the right middle finger that is lacerated in between the index finger, I did not pull the laceration to check depth, but pt was able to flex and extend the finger. I patted the area dry and wrapped with sterile gauze with the middle finger slightly flexed and told pt to go to the Occupational nurse at work or contact the after hours nurse. Pt states understanding.

## 2017-01-01 NOTE — Discharge Instructions (Signed)
Apply neosporin daily and reapply a bandage.  Come back if you have any problems.

## 2017-01-01 NOTE — ED Triage Notes (Signed)
Pt here for right middle finger laceration or metal sink; bleeding controlled

## 2017-01-02 ENCOUNTER — Encounter: Payer: Self-pay | Admitting: Podiatry

## 2017-01-29 ENCOUNTER — Ambulatory Visit: Payer: No Typology Code available for payment source | Admitting: Podiatry

## 2017-02-08 ENCOUNTER — Ambulatory Visit: Payer: No Typology Code available for payment source | Admitting: Podiatry

## 2017-02-08 DIAGNOSIS — M722 Plantar fascial fibromatosis: Secondary | ICD-10-CM

## 2017-02-09 ENCOUNTER — Encounter: Payer: Self-pay | Admitting: Podiatry

## 2017-02-09 NOTE — Progress Notes (Signed)
  Subjective:  Patient ID: [name], female    DOB: 10-30-1960,  MRN: [id number]  Chief Complaint  Patient presents with  . Callouses     4 wk callus/ankle pain f/u   56 y.o. female returns for the above complaint.  States that she felt much better after her injection.  Still reporting pain in the left heel.  Objective:  There were no vitals filed for this visit. General AA&O x3. Normal mood and affect.  Vascular Pedal pulses palpable.  Neurologic Epicritic sensation grossly intact.  Dermatologic No open lesions. Skin normal texture and turgor.  Orthopedic:  Pain to palpation left plantar medial calcaneal tuber   Assessment & Plan:  Patient was evaluated and treated and all questions answered.  Plantar fasciitis left foot -Injection delivered as below  Procedure: Injection Tendon/Ligament Consent: Verbal consent obrained. Location: Left plantar fascia at the glabrous junction; medial approach. Skin Prep: Alcohol. Injectate: 1 cc 0.5% marcaine plain, 1 cc dexamethasone phosphate, 0.5 cc kenalog 10. Disposition: Patient tolerated procedure well. Injection site dressed with a band-aid.  Follow-up PRN

## 2017-02-21 ENCOUNTER — Other Ambulatory Visit: Payer: Self-pay | Admitting: Infectious Diseases

## 2017-02-27 ENCOUNTER — Other Ambulatory Visit: Payer: Self-pay | Admitting: *Deleted

## 2017-02-27 DIAGNOSIS — B20 Human immunodeficiency virus [HIV] disease: Secondary | ICD-10-CM

## 2017-02-27 MED ORDER — ENSURE PO LIQD: 1.0000 | ORAL | 11 refills | Status: DC

## 2017-03-19 ENCOUNTER — Encounter: Payer: Self-pay | Admitting: Infectious Diseases

## 2017-04-26 ENCOUNTER — Other Ambulatory Visit: Payer: Self-pay | Admitting: Infectious Diseases

## 2017-04-26 DIAGNOSIS — Z1231 Encounter for screening mammogram for malignant neoplasm of breast: Secondary | ICD-10-CM

## 2017-05-29 ENCOUNTER — Other Ambulatory Visit: Payer: No Typology Code available for payment source

## 2017-06-12 ENCOUNTER — Ambulatory Visit: Payer: No Typology Code available for payment source | Admitting: Infectious Diseases

## 2017-06-25 ENCOUNTER — Telehealth: Payer: Self-pay | Admitting: *Deleted

## 2017-06-25 NOTE — Telephone Encounter (Signed)
[facility] called to confirm regimen, as patient last filled odefsey 02/21/17. RN spoke to patient, she states she had extra at home and did not need refills until now. She has missed 4 pills.  RN confirmed upcoming appointment. Landis Gandy, RN

## 2017-06-26 ENCOUNTER — Ambulatory Visit
Admission: RE | Admit: 2017-06-26 | Discharge: 2017-06-26 | Disposition: A | Discharge status: Home / Self Care | Payer: No Typology Code available for payment source | Source: Ambulatory Visit | Attending: Infectious Diseases | Admitting: Infectious Diseases

## 2017-06-26 DIAGNOSIS — Z1231 Encounter for screening mammogram for malignant neoplasm of breast: Secondary | ICD-10-CM

## 2017-06-28 ENCOUNTER — Ambulatory Visit: Payer: Self-pay | Attending: Family Medicine

## 2017-07-03 ENCOUNTER — Encounter: Payer: Self-pay | Admitting: Infectious Diseases

## 2017-07-03 ENCOUNTER — Ambulatory Visit (INDEPENDENT_AMBULATORY_CARE_PROVIDER_SITE_OTHER): Payer: Self-pay | Admitting: Infectious Diseases

## 2017-07-03 VITALS — BP 149/93 | HR 73 | Temp 97.6°F | Ht 72.0 in | Wt 157.0 lb

## 2017-07-03 DIAGNOSIS — I10 Essential (primary) hypertension: Secondary | ICD-10-CM

## 2017-07-03 DIAGNOSIS — Z23 Encounter for immunization: Secondary | ICD-10-CM

## 2017-07-03 DIAGNOSIS — K5909 Other constipation: Secondary | ICD-10-CM

## 2017-07-03 DIAGNOSIS — Z113 Encounter for screening for infections with a predominantly sexual mode of transmission: Secondary | ICD-10-CM

## 2017-07-03 DIAGNOSIS — B20 Human immunodeficiency virus [HIV] disease: Secondary | ICD-10-CM

## 2017-07-03 DIAGNOSIS — F17200 Nicotine dependence, unspecified, uncomplicated: Secondary | ICD-10-CM

## 2017-07-03 DIAGNOSIS — S025XXG Fracture of tooth (traumatic), subsequent encounter for fracture with delayed healing: Secondary | ICD-10-CM

## 2017-07-03 DIAGNOSIS — Z79899 Other long term (current) drug therapy: Secondary | ICD-10-CM

## 2017-07-03 NOTE — Assessment & Plan Note (Signed)
She will see dental after remodel.

## 2017-07-03 NOTE — Assessment & Plan Note (Signed)
She has cut down to 3 cig/day.  Using patches.

## 2017-07-03 NOTE — Addendum Note (Signed)
Addended by: Dolan Amen D on: 07/03/2017 02:59 PM   Modules accepted: Orders

## 2017-07-03 NOTE — Progress Notes (Signed)
   Subjective:    Patient ID: [name], female    DOB: Jul 07, 1960, 57 y.o.   MRN: [id number]  HPI 57yo F with HIV+ and cured Hep C (no u/s) and tobacco use. Has been on Odefsy, prior was on atripla (for convenience) since 2007 after having been successfully maintained on Nelfinavir for many years.  Last pap and mammo were (-) 5-2017and 5-2019respectively. Was told she needs PAP every 5 years by her provider. She had colon 06-2016 (single tubular adenoma, hemorrhoids).  She has been having foot pain. She had injection on 12-28 at podiatry. Feet are doing much better since.  Still having diarrhea. Has been seen by GI, getting samples of linzess.  Looking to move jobs.     HIV 1 RNA Quant (copies/mL)  Date Value  11/01/2016 <20 NOT DETECTED  05/02/2016 60 (H)  10/31/2015 <20   CD4 T Cell Abs (/uL)  Date Value  11/01/2016 490  05/02/2016 520  10/31/2015 520    Review of Systems  Constitutional: Negative for appetite change and unexpected weight change.  Gastrointestinal: Positive for constipation. Negative for blood in stool.  Genitourinary: Positive for urgency. Negative for difficulty urinating.  urge incontence Please see HPI. All other systems reviewed and negative.     Objective:   Physical Exam  Constitutional: She is oriented to person, place, and time. She appears well-developed and well-nourished.  HENT:  Mouth/Throat: No oropharyngeal exudate.  Eyes: Pupils are equal, round, and reactive to light. EOM are normal.  Neck: Normal range of motion. Neck supple.  Cardiovascular: Normal rate, regular rhythm and normal heart sounds.  Pulmonary/Chest: Effort normal and breath sounds normal.  Abdominal: Soft. Bowel sounds are normal. She exhibits no distension. There is no tenderness.  Musculoskeletal: Normal range of motion.  Neurological: She is alert and oriented to person, place, and time.  Skin: Skin is warm and dry.  Psychiatric: She has a normal mood  and affect.         Assessment & Plan:

## 2017-07-03 NOTE — Addendum Note (Signed)
Addended by: Pola Corn on: 07/03/2017 02:25 PM   Modules accepted: Orders

## 2017-07-03 NOTE — Assessment & Plan Note (Signed)
Needs labs today Will give her prevnar Set her up for pap here Offered/refused condoms.  rtc in 9 months.

## 2017-07-03 NOTE — Assessment & Plan Note (Signed)
Borderline, will continue to watch her off meds.

## 2017-07-03 NOTE — Assessment & Plan Note (Signed)
Will continue linzess

## 2017-07-04 LAB — T-HELPER CELL (CD4) - (RCID CLINIC ONLY)
CD4 T CELL ABS: 550 /uL (ref 400–2700)
CD4 T CELL HELPER: 31 % — AB (ref 33–55)

## 2017-07-04 LAB — CBC
HEMATOCRIT: 35.3 % (ref 35.0–45.0)
HEMOGLOBIN: 11.6 g/dL — AB (ref 11.7–15.5)
MCH: 28.6 pg (ref 27.0–33.0)
MCHC: 32.9 g/dL (ref 32.0–36.0)
MCV: 87.2 fL (ref 80.0–100.0)
MPV: 10.4 fL (ref 7.5–12.5)
Platelets: 205 10*3/uL (ref 140–400)
RBC: 4.05 10*6/uL (ref 3.80–5.10)
RDW: 11.4 % (ref 11.0–15.0)
WBC: 4.4 10*3/uL (ref 3.8–10.8)

## 2017-07-04 LAB — RPR TITER

## 2017-07-04 LAB — COMPREHENSIVE METABOLIC PANEL
AG RATIO: 1.4 (calc) (ref 1.0–2.5)
ALT: 11 U/L (ref 6–29)
AST: 19 U/L (ref 10–35)
Albumin: 4.3 g/dL (ref 3.6–5.1)
Alkaline phosphatase (APISO): 112 U/L (ref 33–130)
BILIRUBIN TOTAL: 0.4 mg/dL (ref 0.2–1.2)
BUN/Creatinine Ratio: 13 (calc) (ref 6–22)
BUN: 14 mg/dL (ref 7–25)
CALCIUM: 9.5 mg/dL (ref 8.6–10.4)
CO2: 28 mmol/L (ref 20–32)
Chloride: 105 mmol/L (ref 98–110)
Creat: 1.07 mg/dL — ABNORMAL HIGH (ref 0.50–1.05)
GLOBULIN: 3 g/dL (ref 1.9–3.7)
Glucose, Bld: 103 mg/dL — ABNORMAL HIGH (ref 65–99)
POTASSIUM: 4.3 mmol/L (ref 3.5–5.3)
Sodium: 139 mmol/L (ref 135–146)
TOTAL PROTEIN: 7.3 g/dL (ref 6.1–8.1)

## 2017-07-04 LAB — LIPID PANEL
Cholesterol: 163 mg/dL (ref <200)
HDL: 63 mg/dL (ref >50)
LDL CHOLESTEROL (CALC): 83 mg/dL
NON-HDL CHOLESTEROL (CALC): 100 mg/dL (ref <130)
Total CHOL/HDL Ratio: 2.6 (calc) (ref <5.0)
Triglycerides: 81 mg/dL (ref <150)

## 2017-07-04 LAB — FLUORESCENT TREPONEMAL AB(FTA)-IGG-BLD: FLUORESCENT TREPONEMAL ABS: NONREACTIVE

## 2017-07-04 LAB — RPR: RPR Ser Ql: REACTIVE — AB

## 2017-07-05 LAB — HIV-1 RNA QUANT-NO REFLEX-BLD
HIV 1 RNA Quant: <20 copies/mL
HIV-1 RNA Quant, Log: <1.3 Log copies/mL

## 2017-07-15 ENCOUNTER — Other Ambulatory Visit: Payer: Self-pay

## 2017-07-15 DIAGNOSIS — B20 Human immunodeficiency virus [HIV] disease: Secondary | ICD-10-CM

## 2017-07-15 MED ORDER — ENSURE PO LIQD
1.0000 | ORAL | 11 refills | Status: DC
Start: 2017-07-15 — End: 2018-04-08

## 2017-07-19 ENCOUNTER — Ambulatory Visit: Payer: Self-pay | Admitting: Infectious Diseases

## 2017-08-06 ENCOUNTER — Encounter: Payer: Self-pay | Admitting: Infectious Diseases

## 2017-08-06 ENCOUNTER — Ambulatory Visit (INDEPENDENT_AMBULATORY_CARE_PROVIDER_SITE_OTHER): Payer: Self-pay | Admitting: Infectious Diseases

## 2017-08-06 VITALS — BP 128/88 | HR 88 | Temp 97.8°F | Resp 16 | Ht 69.0 in | Wt 158.0 lb

## 2017-08-06 DIAGNOSIS — Z124 Encounter for screening for malignant neoplasm of cervix: Secondary | ICD-10-CM

## 2017-08-06 NOTE — Progress Notes (Signed)
      Subjective:    [name] is a 57 y.o. female here for an annual pelvic exam and pap smear.   Review of Systems: Current GYN complaints or concerns: none outside constipation she sees GI for.  Patient denies any abdominal/pelvic pain, problems with bowel movements, urination, vaginal discharge or intercourse.   Past Medical History:  Diagnosis Date  . Asthma   . Hepatitis C   . HIV (human immunodeficiency virus infection) [facility])    Gynecologic History: [id number]  Patient's last menstrual period was 03/27/2011. Contraception: abstinence Last Pap: 07/01/15. Results were: normal (no HPV testing done)  Anal Intercourse: no Last Mammogram: 06/26/17. Results were: normal  Objective:  Physical Exam  Constitutional: Well developed, well nourished, no acute distress. She is alert and oriented x3.  Pelvic: External genitalia is normal in appearance. The vagina is normal in appearance. The cervix is bulbous and easily visualized. No CMT, normal expected cervical mucus present. Bleeds easily with cervical brush.  Breasts: symmetrical in contour, shape and texture.  Psych: She has a normal mood and affect.    Assessment:  Normal pelvic exam. Thin prep pap was obtained and sent for cytology with reflex HPV and GC/C today.   Plan:  Health Maintenance =   Return in 1 year for annual pap screening unless indicated sooner. We did discuss spacing out to q3y with normal results - considering she has not previously had recommended co-testing with HPV I asked her to continue with annual   She has been counseled and instructed how to perform monthly self breast exams.  Screening mammogram up to date  Colonoscopy up to date   Contraception / Family Planning =   Post menopausal  HIV =   She will continue her Odefsey and F/U as scheduled with Dr. Johnnye Sima for ongoing HIV care.   Janene Madeira, MSN, NP-C [facility] for Infectious [place]  Group Office: [phone] Pager: [phone]  08/06/17 3:49 PM

## 2017-08-09 LAB — CYTOLOGY - PAP
Chlamydia: NEGATIVE
DIAGNOSIS: NEGATIVE
HPV (WINDOPATH): NOT DETECTED
Neisseria Gonorrhea: NEGATIVE

## 2017-08-12 ENCOUNTER — Ambulatory Visit: Payer: Self-pay

## 2017-08-14 ENCOUNTER — Encounter: Payer: Self-pay | Admitting: Infectious Diseases

## 2017-08-14 ENCOUNTER — Other Ambulatory Visit: Payer: Self-pay | Admitting: Infectious Diseases

## 2017-08-14 DIAGNOSIS — B20 Human immunodeficiency virus [HIV] disease: Secondary | ICD-10-CM

## 2017-09-04 ENCOUNTER — Encounter: Payer: Self-pay | Admitting: Infectious Diseases

## 2017-11-19 ENCOUNTER — Telehealth: Payer: Self-pay | Admitting: *Deleted

## 2017-11-19 NOTE — Telephone Encounter (Signed)
[facility] called to advise that the patient called for refill and they have not done any refills for at least 3 months wanting to make sure patient should have medication. After review of the chart advised patient has not been in since May and last labs were June and we have not sent the medication anywhere else so I will need to speak with the patient to see if she has been taking the medication and from where has she been getting them and she needs a lab visit.  Called the patient and left a voicemail to call the office about her medication refill.

## 2017-11-26 ENCOUNTER — Ambulatory Visit (INDEPENDENT_AMBULATORY_CARE_PROVIDER_SITE_OTHER): Payer: Self-pay | Admitting: Pharmacist

## 2017-11-26 ENCOUNTER — Other Ambulatory Visit: Payer: Self-pay | Admitting: *Deleted

## 2017-11-26 ENCOUNTER — Ambulatory Visit (INDEPENDENT_AMBULATORY_CARE_PROVIDER_SITE_OTHER): Payer: Self-pay | Admitting: *Deleted

## 2017-11-26 DIAGNOSIS — B20 Human immunodeficiency virus [HIV] disease: Secondary | ICD-10-CM

## 2017-11-26 DIAGNOSIS — Z23 Encounter for immunization: Secondary | ICD-10-CM

## 2017-11-26 MED ORDER — BICTEGRAVIR-EMTRICITAB-TENOFOV 50-200-25 MG PO TABS: 1.0000 | ORAL_TABLET | Freq: Every day | ORAL | 11 refills | Status: DC

## 2017-11-26 NOTE — Progress Notes (Signed)
HPI: [name] is a 57 y.o. female initially presenting for a flu shot, seen by pharmacy in regards to an identified drug interaction.  Patient Active Problem List   Diagnosis Date Noted  . Callus of foot 12/12/2016  . Multiple renal cysts 08/14/2016  . Constipation 11/30/2014  . Essential hypertension 11/30/2014  . Hot flashes, menopausal 01/27/2014  . Asthma 01/23/2011  . HEADACHE 04/14/2008  . CHOLELITHIASIS 12/15/2007  . PERIPHERAL NEUROPATHY 11/07/2007  . Avulsion fracture of tooth 07/23/2007  . Depression 07/24/2006  . Human immunodeficiency virus (HIV) disease ([place]) 12/14/2005  . 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 TOBACCO ABUSE 12/14/2005  . SUBSTANCE ABUSE 12/14/2005  . GERD 12/14/2005  . PNEUMONIA, HX OF 12/14/2005  . COLPOSCOPY, HX OF 12/14/2005    Patient's Medications  New Prescriptions   BICTEGRAVIR-EMTRICITABINE-TENOFOVIR AF (BIKTARVY) 50-200-25 MG TABS TABLET    Take 1 tablet by mouth daily.  Previous Medications   CETIRIZINE (ZYRTEC) 10 MG TABLET    Take 10 mg by mouth daily as needed for allergies.   CYCLOBENZAPRINE (FLEXERIL) 10 MG TABLET    Take 1 tablet (10 mg total) by mouth 2 (two) times daily as needed for muscle spasms.   ENSURE (ENSURE)    Take 1 Can by mouth See admin instructions. Drink one can once to twice daily   LINACLOTIDE (LINZESS) 145 MCG CAPS CAPSULE    Take 145 mcg by mouth daily before breakfast.   LINACLOTIDE (LINZESS) 72 MCG CAPSULE    Take 72 mcg by mouth daily before breakfast.   NAPROXEN SODIUM (ANAPROX) 220 MG TABLET    Take 220 mg by mouth 2 (two) times daily as needed (for pain.).   OMEPRAZOLE (PRILOSEC) 40 MG CAPSULE    TK 1 C PO QD   PAROXETINE (PAXIL) 20 MG TABLET    TAKE 1 TABLET BY MOUTH EVERY DAY   POLYETHYL GLYCOL-PROPYL GLYCOL (SYSTANE ULTRA) 0.4-0.3 % SOLN    Place 1-2 drops into both eyes 3 (three) times daily as needed (for dry/irritated eyes.).   POLYETHYLENE GLYCOL POWDER (GLYCOLAX/MIRALAX) POWDER    Take 17 g  by mouth 2 (two) times daily as needed.  Modified Medications   No medications on file  Discontinued Medications   ODEFSEY 200-25-25 MG TABS TABLET    TAKE 1 TABLET BY MOUTH DAILY WITH BREAKFAST    Allergies: Allergies  Allergen Reactions  . Aspirin Other (See Comments)    heart murmur    Past Medical History: Past Medical History:  Diagnosis Date  . Asthma   . Hepatitis C   . HIV (human immunodeficiency virus infection) ([place])     Social History: Social History   Socioeconomic History  . Marital status: Single    Spouse name: Not on file  . Number of children: Not on file  . Years of education: Not on file  . Highest education level: Not on file  Occupational History  . Not on file  Social Needs  . Financial resource strain: Not on file  . Food insecurity:    Worry: Not on file    Inability: Not on file  . Transportation needs:    Medical: Not on file    Non-medical: Not on file  Tobacco Use  . Smoking status: Current Every Day Smoker    Packs/day: 0.50    Years: 33.00    Pack years: 16.50    Types: Cigarettes  . Smokeless tobacco: Never Used  . Tobacco comment: cutting back  Substance  and Sexual Activity  . Alcohol use: No    Alcohol/week: 0.0 standard drinks    Comment: recovered alcoholic for 10 years  . 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 Sexual activity: Never    Comment: pt. given condoms  Lifestyle  . Physical activity:    Days per week: Not on file    Minutes per session: Not on file  . Stress: Not on file  Relationships  . Social connections:    Talks on phone: Not on file    Gets together: Not on file    Attends religious service: Not on file    Active member of club or organization: Not on file    Attends meetings of clubs or organizations: Not on file    Relationship status: Not on file  Other Topics Concern  . Not on file  Social History Narrative  . Not on file    Labs: Lab Results  Component Value Date    HIV1RNAQUANT <20 NOT DETECTED 07/03/2017   HIV1RNAQUANT <20 NOT DETECTED 11/01/2016   HIV1RNAQUANT 60 (H) 05/02/2016   CD4TABS 550 07/03/2017   CD4TABS 490 11/01/2016   CD4TABS 520 05/02/2016    RPR and STI Lab Results  Component Value Date   LABRPR REACTIVE (A) 07/03/2017   LABRPR NON REAC 05/02/2016   LABRPR REACTIVE (A) 05/23/2015   LABRPR REACTIVE (A) 04/28/2014   LABRPR REACTIVE (A) 06/11/2013   RPRTITER 1:1 (H) 07/03/2017   RPRTITER 1:1 05/23/2015   RPRTITER 1:2 04/28/2014   RPRTITER 1:4 06/11/2013   RPRTITER 1:1 05/21/2012    STI Results GC CT  08/06/2017 Negative Negative  05/02/2016 Negative Negative  05/24/2015 Negative Negative  04/28/2014 NG: Negative CT: Negative    Hepatitis B Lab Results  Component Value Date   HEPBSAB YES 04/08/2006   HEPBSAG NO 04/08/2006   Hepatitis C No results found for: HEPCAB, HCVRNAPCRQN Hepatitis A Lab Results  Component Value Date   HAV POS (A) 05/21/2012   Lipids: Lab Results  Component Value Date   CHOL 163 07/03/2017   TRIG 81 07/03/2017   HDL 63 07/03/2017   CHOLHDL 2.6 07/03/2017   VLDL 29 05/02/2016   LDLCALC 83 07/03/2017    Current HIV Regimen: Odefsey  Assessment: Pharmacy asked to see [name] by [name] after she noticed a drug interaction between [facility] and omeprazole. After discussing with patient, the decision was made to switch her ART to [facility], which will not interact with PPIs. [name] was counseled on common side effects and on how to take [place]. We encouraged her to call the office if she has any issues picking up or taking Biktarvy. We will see patient in 4-6 weeks to assess tolerance and get labs.  Plan: Stop [facility] F/u on 11/20 @ [place]. [name], PharmD PGY2 Infectious Diseases Pharmacy Resident Phone: [phone] 11/26/2017, 11:44 AM

## 2017-11-26 NOTE — Progress Notes (Signed)
Patient in clinic for flu injection. She reports needing refill of Odefsey, stating she has missed 3 doses. RN refilling medication, sees interaction between odefsey/omeprazole. Patient reports she takes omeprazole intermittently as needed, sometimes twice a week.  RN spoke with Cassie in pharmacy who agreed to see patient. Landis Gandy, RN

## 2018-01-01 ENCOUNTER — Ambulatory Visit: Payer: Self-pay

## 2018-02-20 ENCOUNTER — Encounter (HOSPITAL_COMMUNITY): Payer: Self-pay

## 2018-02-20 ENCOUNTER — Ambulatory Visit (HOSPITAL_COMMUNITY)
Admission: EM | Admit: 2018-02-20 | Discharge: 2018-02-20 | Disposition: A | Discharge status: Home / Self Care | Payer: Self-pay | Attending: Family Medicine | Admitting: Family Medicine

## 2018-02-20 ENCOUNTER — Other Ambulatory Visit: Payer: Self-pay

## 2018-02-20 DIAGNOSIS — N390 Urinary tract infection, site not specified: Secondary | ICD-10-CM | POA: Insufficient documentation

## 2018-02-20 LAB — POCT URINALYSIS DIP (DEVICE)
Bilirubin Urine: NEGATIVE
Glucose, UA: NEGATIVE mg/dL
Ketones, ur: NEGATIVE mg/dL
Nitrite: POSITIVE — AB
PROTEIN: NEGATIVE mg/dL
Specific Gravity, Urine: 1.025 (ref 1.005–1.030)
UROBILINOGEN UA: 1 mg/dL (ref 0.0–1.0)
pH: 7 (ref 5.0–8.0)

## 2018-02-20 MED ORDER — ACETAMINOPHEN 500 MG PO TABS: 500.0000 mg | ORAL_TABLET | Freq: Four times a day (QID) | ORAL | 0 refills | Status: DC | PRN

## 2018-02-20 MED ORDER — NITROFURANTOIN MONOHYD MACRO 100 MG PO CAPS: 100.0000 mg | ORAL_CAPSULE | Freq: Two times a day (BID) | ORAL | 0 refills | Status: DC

## 2018-02-20 NOTE — ED Triage Notes (Signed)
Pt cc back pain. 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 This back pain has been going on for a week or more.

## 2018-02-20 NOTE — Discharge Instructions (Signed)
Your urine was positive for urinary tract infection We will send for culture We are treating this with Macrobid 2 times a day for 5 days Make sure you drink plenty of fluids You can take Tylenol extra strength for pain Follow up as needed for continued or worsening symptoms

## 2018-02-20 NOTE — ED Provider Notes (Signed)
[place]    CSN: [id number] Arrival date & time: 02/20/18  0802     History   Chief Complaint Chief Complaint  Patient presents with  . Back Pain    HPI [name] is a 58 y.o. female.   She is a 58 year old female with past medical history of asthma, hep C, HIV, hypertension.  She presents with approximate 1 week of lower back pain.  This pain started gradually.  She describes as a dull ache in the back.  Symptoms have been constant and worsening.  She has been taking Aleve and using lidocaine patches without relief.  She denies any radiation of pain, numbness, tingling, saddle paresthesias.  She has had some urinary frequency but denies any dysuria, hematuria, fever.  She denies any injury to the back.  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  ROS per HPI      Past Medical History:  Diagnosis Date  . Asthma   . Hepatitis C   . HIV (human immunodeficiency virus infection) [facility])     Patient Active Problem List   Diagnosis Date Noted  . Callus of foot 12/12/2016  . Multiple renal cysts 08/14/2016  . Constipation 11/30/2014  . Essential hypertension 11/30/2014  . Hot flashes, menopausal 01/27/2014  . Asthma 01/23/2011  . HEADACHE 04/14/2008  . CHOLELITHIASIS 12/15/2007  . PERIPHERAL NEUROPATHY 11/07/2007  . Avulsion fracture of tooth 07/23/2007  . Depression 07/24/2006  . Human immunodeficiency virus (HIV) disease ([place]) 12/14/2005  . 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 TOBACCO ABUSE 12/14/2005  . SUBSTANCE ABUSE 12/14/2005  . GERD 12/14/2005  . PNEUMONIA, HX OF 12/14/2005  . COLPOSCOPY, HX OF 12/14/2005    Past Surgical History:  Procedure Laterality Date  . COLONOSCOPY WITH PROPOFOL N/A 06/26/2016   Procedure: COLONOSCOPY WITH PROPOFOL;  Surgeon: Otis Brace, MD;  Location: [facility];  Service: Gastroenterology;  Laterality: N/A;    OB History    Gravida  4   Para  2   Term  2   Preterm      AB  2   Living  2     SAB      TAB  2   Ectopic      Multiple      Live Births               Home Medications    Prior to Admission medications   Medication Sig Start Date End Date Taking? Authorizing Provider  acetaminophen (TYLENOL) 500 MG tablet Take 1 tablet (500 mg total) by mouth every 6 (six) hours as needed. 02/20/18   Dao Memmott, Tressia Miners A, NP  bictegravir-emtricitabine-tenofovir AF (BIKTARVY) 50-200-25 MG TABS tablet Take 1 tablet by mouth daily. 11/26/17   Kuppelweiser, Cassie L, RPH-CPP  cetirizine (ZYRTEC) 10 MG tablet Take 10 mg by mouth daily as needed for allergies.    [provider]  cyclobenzaprine (FLEXERIL) 10 MG tablet Take 1 tablet (10 mg total) by mouth 2 (two) times daily as needed for muscle spasms. 09/19/16   McDonald, Mia A, PA-C  ENSURE (ENSURE) Take 1 Can by mouth See admin instructions. Drink one can once to twice daily 07/15/17   Campbell Riches, MD  linaclotide [facility]) 145 MCG CAPS capsule Take 145 mcg by mouth daily before breakfast.    [provider]  linaclotide (LINZESS) 72 MCG capsule Take 72 mcg by mouth daily before breakfast.    [provider]  naproxen sodium (ANAPROX)  220 MG tablet Take 220 mg by mouth 2 (two) times daily as needed (for pain.).    [provider]  nitrofurantoin, macrocrystal-monohydrate, (MACROBID) 100 MG capsule Take 1 capsule (100 mg total) by mouth 2 (two) times daily. 02/20/18   Loura Halt A, NP  omeprazole (PRILOSEC) 40 MG capsule TK 1 C PO QD 12/04/16   [provider]  PARoxetine (PAXIL) 20 MG tablet TAKE 1 TABLET BY MOUTH EVERY DAY 02/21/17   Campbell Riches, MD  Polyethyl Glycol-Propyl Glycol (SYSTANE ULTRA) 0.4-0.3 % SOLN Place 1-2 drops into both eyes 3 (three) times daily as needed (for dry/irritated eyes.).    [provider]  polyethylene glycol powder (GLYCOLAX/MIRALAX) powder Take 17 g by mouth 2 (two) times daily as needed. 06/19/16   Scot Jun, FNP     Family History Family History  Problem Relation Age of Onset  . Cancer Mother        bone  . Diabetes Sister     Social History Social History   Tobacco Use  . Smoking status: Current Every Day Smoker    Packs/day: 0.50    Years: 33.00    Pack years: 16.50    Types: Cigarettes  . Smokeless tobacco: Never Used  . Tobacco comment: cutting back  Substance Use Topics  . Alcohol use: No    Alcohol/week: 0.0 standard drinks    Comment: recovered alcoholic for 10 years  . Drug use: No    Comment: clean for 10 years     Allergies   Aspirin   Review of Systems Review of Systems   Physical Exam Triage Vital Signs ED Triage Vitals  Enc Vitals Group     BP 02/20/18 0829 (!) 147/103     Pulse --      Resp 02/20/18 0829 18     Temp 02/20/18 0829 97.6 F (36.4 C)     Temp Source 02/20/18 0829 Oral     SpO2 02/20/18 0829 100 %     Weight 02/20/18 0827 152 lb (68.9 kg)     Height --      Head Circumference --      Peak Flow --      Pain Score 02/20/18 0827 8     Pain Loc --      Pain Edu? --      Excl. in [place]? --    No data found.  Updated Vital Signs BP (!) 147/103 (BP Location: Right Arm)   Temp 97.6 F (36.4 C) (Oral)   Resp 18   Wt 152 lb (68.9 kg)   LMP 03/27/2011   SpO2 100%   BMI 22.45 kg/m   Visual Acuity Right Eye Distance:   Left Eye Distance:   Bilateral Distance:    Right Eye Near:   Left Eye Near:    Bilateral Near:     Physical Exam Vitals signs and nursing note reviewed.  Constitutional:      Appearance: Normal appearance.  HENT:     Head: Normocephalic and atraumatic.     Nose: Nose normal.  Eyes:     Conjunctiva/sclera: Conjunctivae normal.  Neck:     Musculoskeletal: Normal range of motion.  Pulmonary:     Effort: Pulmonary effort is normal.  Musculoskeletal: Normal range of motion.     Comments: More pain with standing and bending Good ROM with the spine.  No bony tenderness to the spine.  Mild tenderness to the  right and left lower  lumbar paravertebral muscles.  No bruising, swelling, deformity.   Skin:    General: Skin is warm and dry.     Findings: No rash.  Neurological:     Mental Status: She is alert.  Psychiatric:        Mood and Affect: Mood normal.      UC Treatments / Results  Labs (all labs ordered are listed, but only abnormal results are displayed) Labs Reviewed  POCT URINALYSIS DIP (DEVICE) - Abnormal; Notable for the following components:      Result Value   Hgb urine dipstick MODERATE (*)    Nitrite POSITIVE (*)    Leukocytes, UA SMALL (*)    All other components within normal limits  URINE CULTURE    EKG None  Radiology No results found.  Procedures Procedures (including critical care time)  Medications Ordered in UC Medications - No data to display  Initial Impression / Assessment and Plan / UC Course  I have reviewed the triage vital signs and the nursing notes.  Pertinent labs & imaging results that were available during my care of the patient were reviewed by me and considered in my medical decision making (see chart for details).     Patient urine was positive for infection We will treat with Macrobid twice a day for 5 days Tylenol extra strength for pain Sending urine for culture Work note given Follow up as needed for continued or worsening symptoms  Final Clinical Impressions(s) / UC Diagnoses   Final diagnoses:  Lower urinary tract infectious disease     Discharge Instructions     Your urine was positive for urinary tract infection We will send for culture We are treating this with Macrobid 2 times a day for 5 days Make sure you drink plenty of fluids You can take Tylenol extra strength for pain Follow up as needed for continued or worsening symptoms     ED Prescriptions    Medication Sig Dispense Auth. Provider   nitrofurantoin, macrocrystal-monohydrate, (MACROBID) 100 MG capsule Take 1 capsule (100 mg total) by mouth 2 (two)  times daily. 10 capsule Merwin Breden A, NP   acetaminophen (TYLENOL) 500 MG tablet Take 1 tablet (500 mg total) by mouth every 6 (six) hours as needed. 30 tablet Loura Halt A, NP     Controlled Substance Prescriptions [place] Controlled Substance Registry consulted? Not Applicable   Orvan July, NP 02/20/18 [phone]

## 2018-02-22 LAB — URINE CULTURE: Culture: >=100000 — AB

## 2018-02-24 ENCOUNTER — Telehealth (HOSPITAL_COMMUNITY): Payer: Self-pay | Admitting: Emergency Medicine

## 2018-02-24 NOTE — Telephone Encounter (Signed)
Urine culture was positive for Escherichia coli and was given  nitrofurantoin   at urgent care visit. Pt contacted and made aware, educated on completing antibiotic and to follow up if symptoms are persistent. Verbalized understanding. LVMM

## 2018-02-24 NOTE — Telephone Encounter (Signed)
Spoke with pt sts she is not improved; told to follow up with PCP or here; pt verbalized understanding

## 2018-02-27 ENCOUNTER — Telehealth: Payer: Self-pay | Admitting: *Deleted

## 2018-02-27 DIAGNOSIS — Z0000 Encounter for general adult medical examination without abnormal findings: Secondary | ICD-10-CM

## 2018-02-27 NOTE — Telephone Encounter (Signed)
Patient called, asked if she could be seen as a walk-in tomorrow.  She went to urgent care 1/9, was diagnosed with urinary tract infection.  She has completed treatment but still complains of continuing dysuria/back pain.  RN advised we did not have any appointments available tomorrow, asked if she had seen her primary care physician lately. She forgot that she had been referred to [facility] for primary care, has not followed up with them in a while. RN will resend the referral, gave patient the phone number to see if she can get back into care with them.  She will go to Urgent Care if she cannot be seen at [facility]. Landis Gandy, RN

## 2018-03-03 ENCOUNTER — Encounter: Payer: Self-pay | Admitting: Family Medicine

## 2018-03-03 ENCOUNTER — Ambulatory Visit (INDEPENDENT_AMBULATORY_CARE_PROVIDER_SITE_OTHER): Payer: Self-pay | Admitting: Family Medicine

## 2018-03-03 VITALS — BP 144/86 | HR 72 | Temp 97.9°F | Resp 16 | Ht 69.0 in | Wt 157.0 lb

## 2018-03-03 DIAGNOSIS — N390 Urinary tract infection, site not specified: Secondary | ICD-10-CM

## 2018-03-03 DIAGNOSIS — A499 Bacterial infection, unspecified: Secondary | ICD-10-CM

## 2018-03-03 DIAGNOSIS — M545 Low back pain: Secondary | ICD-10-CM

## 2018-03-03 LAB — POCT URINALYSIS DIPSTICK
Bilirubin, UA: NEGATIVE
Glucose, UA: NEGATIVE
Ketones, UA: NEGATIVE
Nitrite, UA: NEGATIVE
Protein, UA: NEGATIVE
Spec Grav, UA: 1.025 (ref 1.010–1.025)
Urobilinogen, UA: 1 E.U./dL
pH, UA: 6 (ref 5.0–8.0)

## 2018-03-03 MED ORDER — CEPHALEXIN 500 MG PO CAPS: 500.0000 mg | ORAL_CAPSULE | Freq: Two times a day (BID) | ORAL | 0 refills | Status: AC

## 2018-03-03 NOTE — Patient Instructions (Addendum)
I am starting you on a medication called cephalexin or Keflex.  You will take this antibiotic twice a day for 7 days.  Increase your fluid intake.  Please make sure that you complete the entire antibiotic course even if you feel like that it is not working.  If fevers, chills, or back pain increases, please call our office for a follow-up appointment, go to urgent care, or go to the emergency department.     Urinary Tract Infection, Adult A urinary tract infection (UTI) is an infection of any part of the urinary tract. The urinary tract includes:  The kidneys.  The ureters.  The bladder.  The urethra. These organs make, store, and get rid of pee (urine) in the body. What are the causes? This is caused by germs (bacteria) in your genital area. These germs grow and cause swelling (inflammation) of your urinary tract. What increases the risk? You are more likely to develop this condition if:  You have a small, thin tube (catheter) to drain pee.  You cannot control when you pee or poop (incontinence).  You are female, and: ? You use these methods to prevent pregnancy: ? A medicine that kills sperm (spermicide). ? A device that blocks sperm (diaphragm). ? You have low levels of a female hormone (estrogen). ? You are pregnant.  You have genes that add to your risk.  You are sexually active.  You take antibiotic medicines.  You have trouble peeing because of: ? A prostate that is bigger than normal, if you are female. ? A blockage in the part of your body that drains pee from the bladder (urethra). ? A kidney stone. ? A nerve condition that affects your bladder (neurogenic bladder). ? Not getting enough to drink. ? Not peeing often enough.  You have other conditions, such as: ? Diabetes. ? A weak disease-fighting system (immune system). ? Sickle cell disease. ? Gout. ? Injury of the spine. What are the signs or symptoms? Symptoms of this condition include:  Needing to pee  right away (urgently).  Peeing often.  Peeing small amounts often.  Pain or burning when peeing.  Blood in the pee.  Pee that smells bad or not like normal.  Trouble peeing.  Pee that is cloudy.  Fluid coming from the vagina, if you are female.  Pain in the belly or lower back. Other symptoms include:  Throwing up (vomiting).  No urge to eat.  Feeling mixed up (confused).  Being tired and grouchy (irritable).  A fever.  Watery poop (diarrhea). How is this treated? This condition may be treated with:  Antibiotic medicine.  Other medicines.  Drinking enough water. Follow these instructions at home:  Medicines  Take over-the-counter and prescription medicines only as told by your doctor.  If you were prescribed an antibiotic medicine, take it as told by your doctor. Do not stop taking it even if you start to feel better. General instructions  Make sure you: ? Pee until your bladder is empty. ? Do not hold pee for a long time. ? Empty your bladder after sex. ? Wipe from front to back after pooping if you are a female. Use each tissue one time when you wipe.  Drink enough fluid to keep your pee pale yellow.  Keep all follow-up visits as told by your doctor. This is important. Contact a doctor if:  You do not get better after 1-2 days.  Your symptoms go away and then come back. Get help right away  if:  You have very bad back pain.  You have very bad pain in your lower belly.  You have a fever.  You are sick to your stomach (nauseous).  You are throwing up. Summary  A urinary tract infection (UTI) is an infection of any part of the urinary tract.  This condition is caused by germs in your genital area.  There are many risk factors for a UTI. These include having a small, thin tube to drain pee and not being able to control when you pee or poop.  Treatment includes antibiotic medicines for germs.  Drink enough fluid to keep your pee pale  yellow. This information is not intended to replace advice given to you by your health care provider. Make sure you discuss any questions you have with your health care provider. Document Released: 07/18/2007 Document Revised: 08/08/2017 Document Reviewed: 08/08/2017 Elsevier Interactive Patient Education  2019 [employer].

## 2018-03-03 NOTE — Progress Notes (Signed)
[name]   Progress Note: General Provider: Lanae Boast, FNP  SUBJECTIVE:   [name] is a 58 y.o. female who  has a past medical history of Asthma, Hepatitis C, and HIV (human immunodeficiency virus infection) ([place]).. Patient presents today for Follow-up (er follow up for urinary tract infection. Not better. ) and Back Pain Patient presents for follow-up from urgent care.  Patient seen on February 20, 2018 with back pain that she described as a dull ache.  Patient was positive for urinary tract infection started on Macrobid twice a day for 5 days.  Patient states that she stopped the medication because she did not feel like it was working.  Presents today for follow-up and change in antibiotics.  Is fevers chills or night sweats.  Continues to have intermittent back pain.   Review of Systems  Constitutional: Negative.   HENT: Negative.   Eyes: Negative.   Respiratory: Negative.   Cardiovascular: Negative.   Gastrointestinal: Negative.   Genitourinary: Positive for flank pain. Negative for dysuria, hematuria and urgency.  Musculoskeletal: Positive for back pain.  Skin: Negative.   Neurological: Negative.   Psychiatric/Behavioral: Negative.      OBJECTIVE: BP (!) 144/86 (BP Location: Left Arm, Patient Position: Sitting, Cuff Size: Normal)   Pulse 72   Temp 97.9 F (36.6 C) (Oral)   Resp 16   Ht 5\' 9"  (1.753 m)   Wt 157 lb (71.2 kg)   LMP 03/27/2011   SpO2 100%   BMI 23.18 kg/m   Wt Readings from Last 3 Encounters:  03/03/18 157 lb (71.2 kg)  02/20/18 152 lb (68.9 kg)  08/06/17 158 lb (71.7 kg)     Physical Exam Vitals signs and nursing note reviewed.  Constitutional:      General: She is not in acute distress.    Appearance: She is well-developed.  HENT:     Head: Normocephalic and atraumatic.  Eyes:     Conjunctiva/sclera: Conjunctivae normal.     Pupils: Pupils are equal, round, and reactive to  light.  Neck:     Musculoskeletal: Normal range of motion.  Cardiovascular:     Rate and Rhythm: Normal rate and regular rhythm.     Heart sounds: Normal heart sounds.  Pulmonary:     Effort: Pulmonary effort is normal. No respiratory distress.     Breath sounds: Normal breath sounds.  Abdominal:     General: Bowel sounds are normal. There is no distension.     Palpations: Abdomen is soft.     Tenderness: There is right CVA tenderness.  Musculoskeletal: Normal range of motion.  Skin:    General: Skin is warm and dry.  Neurological:     Mental Status: She is alert and oriented to person, place, and time.  Psychiatric:        Mood and Affect: Mood normal.        Behavior: Behavior normal.        Thought Content: Thought content normal.        Judgment: Judgment normal.     ASSESSMENT/PLAN:  Urinary tract bacterial infections With instructed patient to complete full antibiotic course.  Increase fluids.  Also discussed wiping from front to back and urinating after sexual intercourse. - Urinalysis Dipstick - Urine Culture - cephALEXin (KEFLEX) 500 MG capsule; Take 1 capsule (500 mg total) by mouth 2 (two) times daily for 7 days.  Dispense: 14 capsule; Refill: 0  Return if symptoms worsen or fail to improve.    The patient was given clear instructions to go to ER or return to medical center if symptoms do not improve, worsen or new problems develop. The patient verbalized understanding and agreed with plan of care.   [name], FNP-BC [facility] [address] [place], [place] [address] [phone]

## 2018-03-05 LAB — URINE CULTURE

## 2018-03-24 ENCOUNTER — Other Ambulatory Visit: Payer: Self-pay

## 2018-03-24 DIAGNOSIS — Z113 Encounter for screening for infections with a predominantly sexual mode of transmission: Secondary | ICD-10-CM

## 2018-03-24 DIAGNOSIS — Z79899 Other long term (current) drug therapy: Secondary | ICD-10-CM

## 2018-03-24 DIAGNOSIS — B20 Human immunodeficiency virus [HIV] disease: Secondary | ICD-10-CM

## 2018-03-25 LAB — URINE CYTOLOGY ANCILLARY ONLY
Chlamydia: NEGATIVE
Neisseria Gonorrhea: NEGATIVE

## 2018-03-25 LAB — T-HELPER CELL (CD4) - (RCID CLINIC ONLY)
CD4 % Helper T Cell: 26 % — ABNORMAL LOW (ref 33–55)
CD4 T Cell Abs: 460 /uL (ref 400–2700)

## 2018-03-26 LAB — CBC
HCT: 36.7 % (ref 35.0–45.0)
Hemoglobin: 11.7 g/dL (ref 11.7–15.5)
MCH: 28.5 pg (ref 27.0–33.0)
MCHC: 31.9 g/dL — ABNORMAL LOW (ref 32.0–36.0)
MCV: 89.3 fL (ref 80.0–100.0)
MPV: 10.7 fL (ref 7.5–12.5)
Platelets: 165 10*3/uL (ref 140–400)
RBC: 4.11 10*6/uL (ref 3.80–5.10)
RDW: 11.9 % (ref 11.0–15.0)
WBC: 4.3 10*3/uL (ref 3.8–10.8)

## 2018-03-26 LAB — COMPREHENSIVE METABOLIC PANEL
AG RATIO: 1.3 (calc) (ref 1.0–2.5)
ALKALINE PHOSPHATASE (APISO): 107 U/L (ref 37–153)
ALT: 12 U/L (ref 6–29)
AST: 21 U/L (ref 10–35)
Albumin: 4.2 g/dL (ref 3.6–5.1)
BILIRUBIN TOTAL: 0.3 mg/dL (ref 0.2–1.2)
BUN/Creatinine Ratio: 16 (calc) (ref 6–22)
BUN: 17 mg/dL (ref 7–25)
CALCIUM: 9.4 mg/dL (ref 8.6–10.4)
CO2: 25 mmol/L (ref 20–32)
Chloride: 108 mmol/L (ref 98–110)
Creat: 1.09 mg/dL — ABNORMAL HIGH (ref 0.50–1.05)
Globulin: 3.2 g/dL (calc) (ref 1.9–3.7)
Glucose, Bld: 104 mg/dL — ABNORMAL HIGH (ref 65–99)
Potassium: 4.3 mmol/L (ref 3.5–5.3)
Sodium: 140 mmol/L (ref 135–146)
Total Protein: 7.4 g/dL (ref 6.1–8.1)

## 2018-03-26 LAB — RPR TITER: RPR Titer: 1:1 {titer} — ABNORMAL HIGH

## 2018-03-26 LAB — LIPID PANEL
Cholesterol: 152 mg/dL (ref <200)
HDL: 51 mg/dL (ref >=50)
LDL Cholesterol (Calc): 81 mg/dL (calc)
Non-HDL Cholesterol (Calc): 101 mg/dL (calc) (ref <130)
Total CHOL/HDL Ratio: 3 (calc) (ref <5.0)
Triglycerides: 102 mg/dL (ref <150)

## 2018-03-26 LAB — FLUORESCENT TREPONEMAL AB(FTA)-IGG-BLD: Fluorescent Treponemal ABS: NONREACTIVE

## 2018-03-26 LAB — HIV-1 RNA QUANT-NO REFLEX-BLD
HIV 1 RNA Quant: <20 copies/mL — AB
HIV-1 RNA Quant, Log: <1.3 Log copies/mL — AB

## 2018-03-26 LAB — RPR: RPR Ser Ql: REACTIVE — AB

## 2018-04-07 ENCOUNTER — Ambulatory Visit: Payer: Self-pay | Admitting: Infectious Diseases

## 2018-04-08 ENCOUNTER — Ambulatory Visit (INDEPENDENT_AMBULATORY_CARE_PROVIDER_SITE_OTHER): Payer: Self-pay | Admitting: Infectious Diseases

## 2018-04-08 ENCOUNTER — Ambulatory Visit: Payer: Self-pay

## 2018-04-08 ENCOUNTER — Encounter: Payer: Self-pay | Admitting: Infectious Diseases

## 2018-04-08 VITALS — BP 138/95 | HR 68 | Temp 98.1°F | Ht 72.0 in | Wt 155.0 lb

## 2018-04-08 DIAGNOSIS — F17200 Nicotine dependence, unspecified, uncomplicated: Secondary | ICD-10-CM

## 2018-04-08 DIAGNOSIS — B20 Human immunodeficiency virus [HIV] disease: Secondary | ICD-10-CM

## 2018-04-08 DIAGNOSIS — Z79899 Other long term (current) drug therapy: Secondary | ICD-10-CM

## 2018-04-08 DIAGNOSIS — Z720 Tobacco use: Secondary | ICD-10-CM

## 2018-04-08 DIAGNOSIS — Z113 Encounter for screening for infections with a predominantly sexual mode of transmission: Secondary | ICD-10-CM

## 2018-04-08 DIAGNOSIS — S032XXD Dislocation of tooth, subsequent encounter: Secondary | ICD-10-CM

## 2018-04-08 DIAGNOSIS — X58XXXD Exposure to other specified factors, subsequent encounter: Secondary | ICD-10-CM

## 2018-04-08 DIAGNOSIS — I10 Essential (primary) hypertension: Secondary | ICD-10-CM

## 2018-04-08 DIAGNOSIS — K5900 Constipation, unspecified: Secondary | ICD-10-CM

## 2018-04-08 DIAGNOSIS — S025XXD Fracture of tooth (traumatic), subsequent encounter for fracture with routine healing: Secondary | ICD-10-CM

## 2018-04-08 DIAGNOSIS — K5909 Other constipation: Secondary | ICD-10-CM

## 2018-04-08 MED ORDER — ENSURE PO LIQD: 1.0000 | Freq: Three times a day (TID) | ORAL | 11 refills | Status: DC

## 2018-04-08 NOTE — Assessment & Plan Note (Signed)
She has elevated diastolic.  [facility].

## 2018-04-08 NOTE — Assessment & Plan Note (Addendum)
This persists.  Will have her back with GI at her request.  Query if this is driving her urine difficulties, abd pain.

## 2018-04-08 NOTE — Progress Notes (Signed)
   Subjective:    Patient ID: [name], female    DOB: 01/13/1961, 58 y.o.   MRN: [id number]  HPI 58yo F with HIV+ and cured Hep C (no u/s) and tobacco use. Has been on Odefsy, prior was on atripla (for convenience) since 2007 after having been successfully maintained on Nelfinavirfor many years.  Last pap and mammo were (-) 5-2017and 5-2019respectively.Was told she needs PAP every 5 years by her provider. She had colon 06-2016 (single tubular adenoma, hemorrhoids). Next set at 10 yrs.  She has been having foot pain. She had injection on 12-28 at podiatry.  She had PAP 07-2017 (-). Her ART was changed to biktarvy in October after she was noted to be on protonix. She feels this has given her stomach pains. Feels like menstrual cramps, back pain as well.  No dysuria. Has had some leakage.    HIV 1 RNA Quant (copies/mL)  Date Value  03/24/2018 <20 DETECTED (A)  07/03/2017 <20 NOT DETECTED  11/01/2016 <20 NOT DETECTED   CD4 T Cell Abs (/uL)  Date Value  03/24/2018 460  07/03/2017 550  11/01/2016 490    Review of Systems  Constitutional: Negative for appetite change, chills, fever and unexpected weight change.  Eyes: Positive for pain.  Gastrointestinal: Positive for abdominal pain and constipation. Negative for diarrhea.  Genitourinary: Positive for frequency and urgency. Negative for dysuria.  Neurological: Positive for headaches.  Please see HPI. All other systems reviewed and negative. BM q2 weeks. Linzess helps. Drinks lots of water.  Would like re-refer to GI.     Objective:   Physical Exam Constitutional:      Appearance: Normal appearance.  Eyes:     Extraocular Movements: Extraocular movements intact.     Pupils: Pupils are equal, round, and reactive to light.  Neck:     Musculoskeletal: Normal range of motion and neck supple.  Cardiovascular:     Rate and Rhythm: Normal rate and regular rhythm.  Pulmonary:     Effort: Pulmonary effort is normal.     Breath sounds: Normal breath sounds.  Abdominal:     General: Bowel sounds are normal. There is no distension.     Palpations: Abdomen is soft.     Tenderness: There is no abdominal tenderness.  Musculoskeletal:     Right lower leg: No edema.     Left lower leg: No edema.  Neurological:     General: No focal deficit present.     Mental Status: She is alert and oriented to person, place, and time.  Psychiatric:        Mood and Affect: Mood normal.       Assessment & Plan:

## 2018-04-08 NOTE — Assessment & Plan Note (Signed)
She has f/u with dental clinic here in [place].

## 2018-04-08 NOTE — Assessment & Plan Note (Signed)
Encouraged to quit.  < 1 pp/month

## 2018-04-08 NOTE — Assessment & Plan Note (Signed)
She is doing well Will continue her on biktarvy after discussing with pt.  Given condoms, not active.  rtc in 9 months.

## 2018-04-16 ENCOUNTER — Encounter: Payer: Self-pay | Admitting: Infectious Diseases

## 2018-04-22 ENCOUNTER — Ambulatory Visit: Payer: Self-pay | Admitting: Gastroenterology

## 2018-04-23 ENCOUNTER — Other Ambulatory Visit: Payer: Self-pay

## 2018-04-23 ENCOUNTER — Encounter: Payer: Self-pay | Admitting: Family Medicine

## 2018-04-23 ENCOUNTER — Ambulatory Visit (INDEPENDENT_AMBULATORY_CARE_PROVIDER_SITE_OTHER): Payer: Self-pay | Admitting: Family Medicine

## 2018-04-23 VITALS — BP 136/90 | HR 70 | Temp 98.0°F | Ht 72.0 in | Wt 155.2 lb

## 2018-04-23 DIAGNOSIS — J0110 Acute frontal sinusitis, unspecified: Secondary | ICD-10-CM

## 2018-04-23 DIAGNOSIS — I10 Essential (primary) hypertension: Secondary | ICD-10-CM

## 2018-04-23 LAB — POCT URINALYSIS DIP (MANUAL ENTRY)
Bilirubin, UA: NEGATIVE
Glucose, UA: NEGATIVE mg/dL
Ketones, POC UA: NEGATIVE mg/dL
Leukocytes, UA: NEGATIVE
Nitrite, UA: NEGATIVE
Protein Ur, POC: NEGATIVE mg/dL
Spec Grav, UA: 1.02 (ref 1.010–1.025)
Urobilinogen, UA: 2 E.U./dL — AB
pH, UA: 7 (ref 5.0–8.0)

## 2018-04-23 MED ORDER — AMLODIPINE BESYLATE 5 MG PO TABS: 5.0000 mg | ORAL_TABLET | Freq: Every day | ORAL | 3 refills | Status: DC

## 2018-04-23 MED ORDER — CETIRIZINE HCL 10 MG PO TABS: 10.0000 mg | ORAL_TABLET | Freq: Every day | ORAL | 2 refills | Status: DC | PRN

## 2018-04-23 MED ORDER — FLUCONAZOLE 150 MG PO TABS: 150.0000 mg | ORAL_TABLET | Freq: Once | ORAL | 0 refills | Status: AC

## 2018-04-23 MED ORDER — AMOXICILLIN-POT CLAVULANATE 875-125 MG PO TABS: 1.0000 | ORAL_TABLET | Freq: Two times a day (BID) | ORAL | 0 refills | Status: AC

## 2018-04-23 MED ORDER — FLUTICASONE PROPIONATE 50 MCG/ACT NA SUSP: 2.0000 | Freq: Every day | NASAL | 6 refills | Status: DC

## 2018-04-23 MED FILL — AMOX-CLAV 875-125 MG TABLET: 875-125 | 7 days supply | Qty: 14 | Fill #0

## 2018-04-23 MED FILL — FLUTICASONE PROP 50 MCG SPR: 50 | 30 days supply | Qty: 16 | Fill #0

## 2018-04-23 MED FILL — FLUCONAZOLE 150 MG TABS: 150 | 1 days supply | Qty: 1 | Fill #0

## 2018-04-23 MED FILL — AMLODIPINE BESYLATE 5 MG TA: 5 | 30 days supply | Qty: 30 | Fill #0

## 2018-04-23 NOTE — Patient Instructions (Addendum)
Sinusitis, Adult Sinusitis is soreness and swelling (inflammation) of your sinuses. Sinuses are hollow spaces in the bones around your face. They are located:  Around your eyes.  In the middle of your forehead.  Behind your nose.  In your cheekbones. Your sinuses and nasal passages are lined with a fluid called mucus. Mucus drains out of your sinuses. Swelling can trap mucus in your sinuses. This lets germs (bacteria, virus, or fungus) grow, which leads to infection. Most of the time, this condition is caused by a virus. What are the causes? This condition is caused by:  Allergies.  Asthma.  Germs.  Things that block your nose or sinuses.  Growths in the nose (nasal polyps).  Chemicals or irritants in the air.  Fungus (rare). What increases the risk? You are more likely to develop this condition if:  You have a weak body defense system (immune system).  You do a lot of swimming or diving.  You use nasal sprays too much.  You smoke. What are the signs or symptoms? The main symptoms of this condition are pain and a feeling of pressure around the sinuses. Other symptoms include:  Stuffy nose (congestion).  Runny nose (drainage).  Swelling and warmth in the sinuses.  Headache.  Toothache.  A cough that may get worse at night.  Mucus that collects in the throat or the back of the nose (postnasal drip).  Being unable to smell and taste.  Being very tired (fatigue).  A fever.  Sore throat.  Bad breath. How is this diagnosed? This condition is diagnosed based on:  Your symptoms.  Your medical history.  A physical exam.  Tests to find out if your condition is short-term (acute) or long-term (chronic). Your doctor may: ? Check your nose for growths (polyps). ? Check your sinuses using a tool that has a light (endoscope). ? Check for allergies or germs. ? Do imaging tests, such as an MRI or CT scan. How is this treated? Treatment for this condition  depends on the cause and whether it is short-term or long-term.  If caused by a virus, your symptoms should go away on their own within 10 days. You may be given medicines to relieve symptoms. They include: ? Medicines that shrink swollen tissue in the nose. ? Medicines that treat allergies (antihistamines). ? A spray that treats swelling of the nostrils. ? Rinses that help get rid of thick mucus in your nose (nasal saline washes).  If caused by bacteria, your doctor may wait to see if you will get better without treatment. You may be given antibiotic medicine if you have: ? A very bad infection. ? A weak body defense system.  If caused by growths in the nose, you may need to have surgery. Follow these instructions at home: Medicines  Take, use, or apply over-the-counter and prescription medicines only as told by your doctor. These may include nasal sprays.  If you were prescribed an antibiotic medicine, take it as told by your doctor. Do not stop taking the antibiotic even if you start to feel better. Hydrate and humidify   Drink enough water to keep your pee (urine) pale yellow.  Use a cool mist humidifier to keep the humidity level in your home above 50%.  Breathe in steam for 10-15 minutes, 3-4 times a day, or as told by your doctor. You can do this in the bathroom while a hot shower is running.  Try not to spend time in cool or dry air.  Rest  Rest as much as you can.  Sleep with your head raised (elevated).  Make sure you get enough sleep each night. General instructions   Put a warm, moist washcloth on your face 3-4 times a day, or as often as told by your doctor. This will help with discomfort.  Wash your hands often with soap and water. If there is no soap and water, use hand sanitizer.  Do not smoke. Avoid being around people who are smoking (secondhand smoke).  Keep all follow-up visits as told by your doctor. This is important. Contact a doctor if:  You  have a fever.  Your symptoms get worse.  Your symptoms do not get better within 10 days. Get help right away if:  You have a very bad headache.  You cannot stop throwing up (vomiting).  You have very bad pain or swelling around your face or eyes.  You have trouble seeing.  You feel confused.  Your neck is stiff.  You have trouble breathing. Summary  Sinusitis is swelling of your sinuses. Sinuses are hollow spaces in the bones around your face.  This condition is caused by tissues in your nose that become inflamed or swollen. This traps germs. These can lead to infection.  If you were prescribed an antibiotic medicine, take it as told by your doctor. Do not stop taking it even if you start to feel better.  Keep all follow-up visits as told by your doctor. This is important. This information is not intended to replace advice given to you by your health care provider. Make sure you discuss any questions you have with your health care provider. Document Released: 07/18/2007 Document Revised: 07/01/2017 Document Reviewed: 07/01/2017 Elsevier Interactive Patient Education  2019 Else[employer] Inc[employer] Amlodipine tablets What is this medicine? AMLODIPINE (am LOE di peen) is a calcium-channel blocker. It affects the amount of calcium found in your heart and muscle cells. This relaxes your blood vessels, which can reduce the amount of work the heart has to do. This medicine is used to lower high blood pressure. It is also used to prevent chest pain. This medicine may be used for other purposes; ask your health care provider or pharmacist if you have questions. COMMON BRAND NAME(S): Norvasc What should I tell my health care provider before I take this medicine? They need to know if you have any of these conditions: -heart disease -liver disease -an unusual or allergic reaction to amlodipine, other medicines, foods, dyes, or preservatives -pregnant or trying to get  pregnant -breast-feeding How should I use this medicine? Take this medicine by mouth with a glass of water. Follow the directions on the prescription label. You can take it with or without food. If it upsets your stomach, take it with food. Take your medicine at regular intervals. Do not take it more often than directed. Do not stop taking except on your doctor's advice. Talk to your pediatrician regarding the use of this medicine in children. While this drug may be prescribed for children as young as 6 years for selected conditions, precautions do apply. Patients over 71 years of age may have a stronger reaction and need a smaller dose. Overdosage: If you think you have taken too much of this medicine contact a poison control center or emergency room at once. NOTE: This medicine is only for you. Do not share this medicine with others. What if I miss a dose? If you miss a dose, take it as soon as you can. If  it is almost time for your next dose, take only that dose. Do not take double or extra doses. What may interact with this medicine? Do not take this medicine with any of the following medications: -tranylcypromine This medicine may also interact with the following medications: -clarithromycin -cyclosporine -diltiazem -itraconazole -simvastatin -tacrolimus This list may not describe all possible interactions. Give your health care provider a list of all the medicines, herbs, non-prescription drugs, or dietary supplements you use. Also tell them if you smoke, drink alcohol, or use illegal drugs. Some items may interact with your medicine. What should I watch for while using this medicine? Visit your healthcare professional for regular checks on your progress. Check your blood pressure as directed. Ask your healthcare professional what your blood pressure should be and when you should contact him or her. Do not treat yourself for coughs, colds, or pain while you are using this medicine  without asking your healthcare professional for advice. Some medicines may increase your blood pressure. You may get dizzy. Do not drive, use machinery, or do anything that needs mental alertness until you know how this medicine affects you. Do not stand or sit up quickly, especially if you are an older patient. This reduces the risk of dizzy or fainting spells. Avoid alcoholic drinks; they can make you dizzier. What side effects may I notice from receiving this medicine? Side effects that you should report to your doctor or health care professional as soon as possible: -allergic reactions like skin rash, itching or hives; swelling of the face, lips, or tongue -fast, irregular heartbeat -signs and symptoms of low blood pressure like dizziness; feeling faint or lightheaded, falls; unusually weak or tired -swelling of ankles, feet, hands Side effects that usually do not require medical attention (report these to your doctor or health care professional if they continue or are bothersome): -dry mouth -facial flushing -headache -stomach pain -tiredness This list may not describe all possible side effects. Call your doctor for medical advice about side effects. You may report side effects to FDA at 1-800-FDA-1088. Where should I keep my medicine? Keep out of the reach of children. Store at room temperature between 59 and 86 degrees F (15 and 30 degrees C). Throw away any unused medicine after the expiration date. NOTE: This sheet is a summary. It may not cover all possible information. If you have questions about this medicine, talk to your doctor, pharmacist, or health care provider.  2019 Elsevier/Gold Standard (2017-08-23 15:07:10) DASH Eating Plan DASH stands for "Dietary Approaches to Stop Hypertension." The DASH eating plan is a healthy eating plan that has been shown to reduce high blood pressure (hypertension). It may also reduce your risk for type 2 diabetes, heart disease, and stroke. The  DASH eating plan may also help with weight loss. What are tips for following this plan?  General guidelines  Avoid eating more than 2,300 mg (milligrams) of salt (sodium) a day. If you have hypertension, you may need to reduce your sodium intake to 1,500 mg a day.  Limit alcohol intake to no more than 1 drink a day for nonpregnant women and 2 drinks a day for men. One drink equals 12 oz of beer, 5 oz of wine, or 1 oz of hard liquor.  Work with your health care provider to maintain a healthy body weight or to lose weight. Ask what an ideal weight is for you.  Get at least 30 minutes of exercise that causes your heart to beat faster (aerobic  exercise) most days of the week. Activities may include walking, swimming, or biking.  Work with your health care provider or diet and nutrition specialist (dietitian) to adjust your eating plan to your individual calorie needs. Reading food labels   Check food labels for the amount of sodium per serving. Choose foods with less than 5 percent of the Daily Value of sodium. Generally, foods with less than 300 mg of sodium per serving fit into this eating plan.  To find whole grains, look for the word "whole" as the first word in the ingredient list. Shopping  Buy products labeled as "low-sodium" or "no salt added."  Buy fresh foods. Avoid canned foods and premade or frozen meals. Cooking  Avoid adding salt when cooking. Use salt-free seasonings or herbs instead of table salt or sea salt. Check with your health care provider or pharmacist before using salt substitutes.  Do not fry foods. Cook foods using healthy methods such as baking, boiling, grilling, and broiling instead.  Cook with heart-healthy oils, such as olive, canola, soybean, or sunflower oil. Meal planning  Eat a balanced diet that includes: ? 5 or more servings of fruits and vegetables each day. At each meal, try to fill half of your plate with fruits and vegetables. ? Up to 6-8  servings of whole grains each day. ? Less than 6 oz of lean meat, poultry, or fish each day. A 3-oz serving of meat is about the same size as a deck of cards. One egg equals 1 oz. ? 2 servings of low-fat dairy each day. ? A serving of nuts, seeds, or beans 5 times each week. ? Heart-healthy fats. Healthy fats called Omega-3 fatty acids are found in foods such as flaxseeds and coldwater fish, like sardines, salmon, and mackerel.  Limit how much you eat of the following: ? Canned or prepackaged foods. ? Food that is high in trans fat, such as fried foods. ? Food that is high in saturated fat, such as fatty meat. ? Sweets, desserts, sugary drinks, and other foods with added sugar. ? Full-fat dairy products.  Do not salt foods before eating.  Try to eat at least 2 vegetarian meals each week.  Eat more home-cooked food and less restaurant, buffet, and fast food.  When eating at a restaurant, ask that your food be prepared with less salt or no salt, if possible. What foods are recommended? The items listed may not be a complete list. Talk with your dietitian about what dietary choices are best for you. Grains Whole-grain or whole-wheat bread. Whole-grain or whole-wheat pasta. Brown rice. [place]. Bulgur. Whole-grain and low-sodium cereals. Pita bread. Low-fat, low-sodium crackers. Whole-wheat flour tortillas. Vegetables Fresh or frozen vegetables (raw, steamed, roasted, or grilled). Low-sodium or reduced-sodium tomato and vegetable juice. Low-sodium or reduced-sodium tomato sauce and tomato paste. Low-sodium or reduced-sodium canned vegetables. Fruits All fresh, dried, or frozen fruit. Canned fruit in natural juice (without added sugar). Meat and other protein foods Skinless chicken or [place]. Ground chicken or [place]. Pork with fat trimmed off. Fish and seafood. Egg whites. Dried beans, peas, or lentils. Unsalted nuts, nut butters, and seeds. Unsalted canned beans. Lean cuts of beef  with fat trimmed off. Low-sodium, lean deli meat. Dairy Low-fat (1%) or fat-free (skim) milk. Fat-free, low-fat, or reduced-fat cheeses. Nonfat, low-sodium ricotta or cottage cheese. Low-fat or nonfat yogurt. Low-fat, low-sodium cheese. Fats and oils Soft margarine without trans fats. Vegetable oil. Low-fat, reduced-fat, or light mayonnaise and salad dressings (reduced-sodium). Canola, safflower,  olive, soybean, and sunflower oils. Avocado. Seasoning and other foods Herbs. Spices. Seasoning mixes without salt. Unsalted popcorn and pretzels. Fat-free sweets. What foods are not recommended? The items listed may not be a complete list. Talk with your dietitian about what dietary choices are best for you. Grains Baked goods made with fat, such as croissants, muffins, or some breads. Dry pasta or rice meal packs. Vegetables Creamed or fried vegetables. Vegetables in a cheese sauce. Regular canned vegetables (not low-sodium or reduced-sodium). Regular canned tomato sauce and paste (not low-sodium or reduced-sodium). Regular tomato and vegetable juice (not low-sodium or reduced-sodium). Angie Fava. Olives. Fruits Canned fruit in a light or heavy syrup. Fried fruit. Fruit in cream or butter sauce. Meat and other protein foods Fatty cuts of meat. Ribs. Fried meat. Berniece Salines. Sausage. Bologna and other processed lunch meats. Salami. Fatback. Hotdogs. Bratwurst. Salted nuts and seeds. Canned beans with added salt. Canned or smoked fish. Whole eggs or egg yolks. Chicken or [place] with skin. Dairy Whole or 2% milk, cream, and half-and-half. Whole or full-fat cream cheese. Whole-fat or sweetened yogurt. Full-fat cheese. Nondairy creamers. Whipped toppings. Processed cheese and cheese spreads. Fats and oils Butter. Stick margarine. Lard. Shortening. Ghee. Bacon fat. Tropical oils, such as coconut, palm kernel, or palm oil. Seasoning and other foods Salted popcorn and pretzels. Onion salt, garlic salt, seasoned salt,  table salt, and sea salt. Worcestershire sauce. Tartar sauce. Barbecue sauce. Teriyaki sauce. Soy sauce, including reduced-sodium. Steak sauce. Canned and packaged gravies. Fish sauce. Oyster sauce. Cocktail sauce. Horseradish that you find on the shelf. Ketchup. Mustard. Meat flavorings and tenderizers. Bouillon cubes. Hot sauce and Tabasco sauce. Premade or packaged marinades. Premade or packaged taco seasonings. Relishes. Regular salad dressings. Where to find more information:  National Heart, Lung, and [place]: https://wilson-eaton.com/  American Heart Association: www.heart.org Summary  The DASH eating plan is a healthy eating plan that has been shown to reduce high blood pressure (hypertension). It may also reduce your risk for type 2 diabetes, heart disease, and stroke.  With the DASH eating plan, you should limit salt (sodium) intake to 2,300 mg a day. If you have hypertension, you may need to reduce your sodium intake to 1,500 mg a day.  When on the DASH eating plan, aim to eat more fresh fruits and vegetables, whole grains, lean proteins, low-fat dairy, and heart-healthy fats.  Work with your health care provider or diet and nutrition specialist (dietitian) to adjust your eating plan to your individual calorie needs. This information is not intended to replace advice given to you by your health care provider. Make sure you discuss any questions you have with your health care provider. Document Released: 01/18/2011 Document Revised: 01/23/2016 Document Reviewed: 01/23/2016 Elsevier Interactive Patient Education  2019 [employer].

## 2018-04-23 NOTE — Progress Notes (Signed)
Patient [facility]   Progress Note: General Provider: Lanae Boast, FNP  SUBJECTIVE:   BRIGITTA [name] is a 58 y.o. female who  has a past medical history of Asthma, Hepatitis C, and HIV (human immunodeficiency virus infection) ([place]).. Patient presents today for Follow-up (blood pressure ); Headache; and Neck Pain Patient states that she has had headaches off and on for the past 6 months.  She states that she has a history of allergic rhinitis and does not take anything for this. Has taken Flonase in the past. She does Patient states that she is having headaches that occur behind the eyes. She does wear glasses and has not had an eye exam in over a year.  Patient with consistent elevated blood pressure readings.  She states that she has never been on BP medications in the past. Does not follow a carb modified or low sodium diet.   Review of Systems  Constitutional: Negative.   HENT: Positive for congestion and sinus pain.   Eyes: Negative.   Respiratory: Negative.   Cardiovascular: Negative.   Gastrointestinal: Negative.   Genitourinary: Negative.   Musculoskeletal: Negative.   Skin: Negative.   Neurological: Positive for headaches.  Endo/Heme/Allergies: Positive for environmental allergies.  Psychiatric/Behavioral: Negative.      OBJECTIVE: BP 136/90 (BP Location: Left Arm, Patient Position: Sitting, Cuff Size: Small)   Pulse 70   Temp 98 F (36.7 C) (Oral)   Ht 6' (1.829 m)   Wt 155 lb 3.2 oz (70.4 kg)   LMP 03/27/2011   SpO2 100%   BMI 21.05 kg/m   Wt Readings from Last 3 Encounters:  04/23/18 155 lb 3.2 oz (70.4 kg)  04/08/18 155 lb (70.3 kg)  03/03/18 157 lb (71.2 kg)     Physical Exam Vitals signs and nursing note reviewed.  Constitutional:      General: She is not in acute distress.    Appearance: She is well-developed.  HENT:     Head: Normocephalic and atraumatic.     Nose: Mucosal edema and rhinorrhea  present.     Right Turbinates: Swollen.     Left Turbinates: Swollen.     Right Sinus: Frontal sinus tenderness present.     Left Sinus: Frontal sinus tenderness present.     Mouth/Throat:     Mouth: Mucous membranes are moist.     Pharynx: Oropharynx is clear.  Eyes:     Conjunctiva/sclera: Conjunctivae normal.     Pupils: Pupils are equal, round, and reactive to light.  Neck:     Musculoskeletal: Normal range of motion.  Cardiovascular:     Rate and Rhythm: Normal rate and regular rhythm.     Heart sounds: Normal heart sounds.  Pulmonary:     Effort: Pulmonary effort is normal. No respiratory distress.     Breath sounds: Normal breath sounds.  Abdominal:     General: Bowel sounds are normal. There is no distension.     Palpations: Abdomen is soft.  Musculoskeletal: Normal range of motion.  Skin:    General: Skin is warm and dry.  Neurological:     Mental Status: She is alert and oriented to person, place, and time.  Psychiatric:        Behavior: Behavior normal.        Thought Content: Thought content normal.     ASSESSMENT/PLAN:   1. Essential hypertension Dash diet - POCT urinalysis dipstick - amLODipine (NORVASC) 5 MG tablet; Take  1 tablet (5 mg total) by mouth daily.  Dispense: 90 tablet; Refill: 3  2. Acute non-recurrent frontal sinusitis - fluticasone (FLONASE) 50 MCG/ACT nasal spray; Place 2 sprays into both nostrils daily.  Dispense: 16 g; Refill: 6 - cetirizine (ZYRTEC) 10 MG tablet; Take 1 tablet (10 mg total) by mouth daily as needed for allergies.  Dispense: 30 tablet; Refill: 2 - amoxicillin-clavulanate (AUGMENTIN) 875-125 MG tablet; Take 1 tablet by mouth every 12 (twelve) hours for 7 days.  Dispense: 14 tablet; Refill: 0 - fluconazole (DIFLUCAN) 150 MG tablet; Take 1 tablet (150 mg total) by mouth once for 1 dose.  Dispense: 1 tablet; Refill: 0    Return in about 2 weeks (around 05/07/2018) for BP.    The patient was given clear instructions to go to  ER or return to medical center if symptoms do not improve, worsen or new problems develop. The patient verbalized understanding and agreed with plan of care.   Ms. Doug Sou. Tabitha Canary, FNP-BC [facility] [address] [place], [place] [address] [phone]

## 2018-05-07 ENCOUNTER — Other Ambulatory Visit: Payer: Self-pay

## 2018-05-07 ENCOUNTER — Encounter: Payer: Self-pay | Admitting: Family Medicine

## 2018-05-07 ENCOUNTER — Ambulatory Visit (INDEPENDENT_AMBULATORY_CARE_PROVIDER_SITE_OTHER): Payer: Self-pay | Admitting: Family Medicine

## 2018-05-07 VITALS — BP 117/73 | HR 67 | Temp 98.1°F | Resp 16 | Wt 155.2 lb

## 2018-05-07 DIAGNOSIS — I10 Essential (primary) hypertension: Secondary | ICD-10-CM

## 2018-05-07 DIAGNOSIS — G44229 Chronic tension-type headache, not intractable: Secondary | ICD-10-CM

## 2018-05-07 LAB — POCT URINALYSIS DIP (CLINITEK)
Bilirubin, UA: NEGATIVE
Glucose, UA: NEGATIVE mg/dL
Ketones, POC UA: NEGATIVE mg/dL
Leukocytes, UA: NEGATIVE
Nitrite, UA: NEGATIVE
POC PROTEIN,UA: NEGATIVE
Spec Grav, UA: 1.015 (ref 1.010–1.025)
Urobilinogen, UA: 0.2 E.U./dL
pH, UA: 6 (ref 5.0–8.0)

## 2018-05-07 MED ORDER — AMLODIPINE BESYLATE 5 MG PO TABS: 5.0000 mg | ORAL_TABLET | Freq: Every day | ORAL | 3 refills | Status: DC

## 2018-05-07 MED ORDER — CYCLOBENZAPRINE HCL 10 MG PO TABS: 10.0000 mg | ORAL_TABLET | Freq: Three times a day (TID) | ORAL | 0 refills | Status: DC | PRN

## 2018-05-07 NOTE — Patient Instructions (Addendum)
Hypertension Hypertension is another name for high blood pressure. High blood pressure forces your heart to work harder to pump blood. This can cause problems over time. There are two numbers in a blood pressure reading. There is a top number (systolic) over a bottom number (diastolic). It is best to have a blood pressure below 120/80. Healthy choices can help lower your blood pressure. You may need medicine to help lower your blood pressure if:  Your blood pressure cannot be lowered with healthy choices.  Your blood pressure is higher than 130/80. Follow these instructions at home: Eating and drinking   If directed, follow the DASH eating plan. This diet includes: ? Filling half of your plate at each meal with fruits and vegetables. ? Filling one quarter of your plate at each meal with whole grains. Whole grains include whole wheat pasta, brown rice, and whole grain bread. ? Eating or drinking low-fat dairy products, such as skim milk or low-fat yogurt. ? Filling one quarter of your plate at each meal with low-fat (lean) proteins. Low-fat proteins include fish, skinless chicken, eggs, beans, and tofu. ? Avoiding fatty meat, cured and processed meat, or chicken with skin. ? Avoiding premade or processed food.  Eat less than 1,500 mg of salt (sodium) a day.  Limit alcohol use to no more than 1 drink a day for nonpregnant women and 2 drinks a day for men. One drink equals 12 oz of beer, 5 oz of wine, or 1 oz of hard liquor. Lifestyle  Work with your doctor to stay at a healthy weight or to lose weight. Ask your doctor what the best weight is for you.  Get at least 30 minutes of exercise that causes your heart to beat faster (aerobic exercise) most days of the week. This may include walking, swimming, or biking.  Get at least 30 minutes of exercise that strengthens your muscles (resistance exercise) at least 3 days a week. This may include lifting weights or pilates.  Do not use any  products that contain nicotine or tobacco. This includes cigarettes and e-cigarettes. If you need help quitting, ask your doctor.  Check your blood pressure at home as told by your doctor.  Keep all follow-up visits as told by your doctor. This is important. Medicines  Take over-the-counter and prescription medicines only as told by your doctor. Follow directions carefully.  Do not skip doses of blood pressure medicine. The medicine does not work as well if you skip doses. Skipping doses also puts you at risk for problems.  Ask your doctor about side effects or reactions to medicines that you should watch for. Contact a doctor if:  You think you are having a reaction to the medicine you are taking.  You have headaches that keep coming back (recurring).  You feel dizzy.  You have swelling in your ankles.  You have trouble with your vision. Get help right away if:  You get a very bad headache.  You start to feel confused.  You feel weak or numb.  You feel faint.  You get very bad pain in your: ? Chest. ? Belly (abdomen).  You throw up (vomit) more than once.  You have trouble breathing. Summary  Hypertension is another name for high blood pressure.  Making healthy choices can help lower blood pressure. If your blood pressure cannot be controlled with healthy choices, you may need to take medicine. This information is not intended to replace advice given to you by your health care   provider. Make sure you discuss any questions you have with your health care provider. Document Released: 07/18/2007 Document Revised: 12/28/2015 Document Reviewed: 12/28/2015 Elsevier Interactive Patient Education  2019 [employer]  Tension Headache, Adult A tension headache is pain, pressure, or aching in your head. Tension headaches can last from 30 minutes to several days. Follow these instructions at home: Managing pain  Take over-the-counter and prescription medicines only as told  by your doctor.  When you have a headache, lie down in a dark, quiet room.  If told, put ice on your head and neck: ? Put ice in a plastic bag. ? Place a towel between your skin and the bag. ? Leave the ice on for 20 minutes, 2-3 times a day.  If told, put heat on the back of your neck. Do this as often as your doctor tells you to. Use the kind of heat that your doctor recommends, such as a moist heat pack or a heating pad. ? Place a towel between your skin and the heat. ? Leave the heat on for 20-30 minutes. ? Remove the heat if your skin turns bright red. Eating and drinking  Eat meals on a regular schedule.  Watch how much alcohol you drink: ? If you are a woman and are not pregnant, do not drink more than 1 drink a day. ? If you are a man, do not drink more than 2 drinks a day.  Drink enough fluid to keep your pee (urine) pale yellow.  Do not use a lot of caffeine, or stop using caffeine. Lifestyle  Get enough sleep. Get 7-9 hours of sleep each night. Or get the amount of sleep that your doctor tells you to.  At bedtime, remove all electronic devices from your room. Examples of electronic devices are computers, phones, and tablets.  Find ways to lessen your stress. Some things that can lessen stress are: ? Exercise. ? Deep breathing. ? Yoga. ? Music. ? Positive thoughts.  Sit up straight. Do not tighten (tense) your muscles.  Do not use any products that have nicotine or tobacco in them, such as cigarettes and e-cigarettes. If you need help quitting, ask your doctor. General instructions   Keep all follow-up visits as told by your doctor. This is important.  Avoid things that can bring on headaches. Keep a journal to find out if certain things bring on headaches. For example, write down: ? What you eat and drink. ? How much sleep you get. ? Any change to your diet or medicines. Contact a doctor if:  Your headache does not get better.  Your headache comes back.   You have a headache and sounds, light, or smells bother you.  You feel sick to your stomach (nauseous) or you throw up (vomit).  Your stomach hurts. Get help right away if:  You suddenly get a very bad headache along with any of these: ? A stiff neck. ? Feeling sick to your stomach. ? Throwing up. ? Feeling weak. ? Trouble seeing. ? Feeling short of breath. ? A rash. ? Feeling unusually sleepy. ? Trouble speaking. ? Pain in your eye or ear. ? Trouble walking or balancing. ? Feeling like you will pass out (faint). ? Passing out. Summary  A tension headache is pain, pressure, or aching in your head.  Tension headaches can last from 30 minutes to several days.  Lifestyle changes and medicines may help relieve pain. This information is not intended to replace advice given to you  by your health care provider. Make sure you discuss any questions you have with your health care provider. Document Released: 04/25/2009 Document Revised: 05/11/2016 Document Reviewed: 05/11/2016 Elsevier Interactive Patient Education  2019 [employer].

## 2018-05-07 NOTE — Progress Notes (Signed)
  [name]   Progress Note: General Provider: Lanae Boast, FNP  SUBJECTIVE:   [name] is a 58 y.o. female who  has a past medical history of Asthma, Hepatitis C, and HIV (human immunodeficiency virus infection) ([place]).. Patient presents today for Sinusitis; Headache; and Hypertension  Patient states that she has headaches almost daily. She states that she has a history of allergic rhinitis. Not currenlty taking any thing for this. She has a history of TMJ and was given flexeril in the past for this. She reports compliance with her anti-hypertensives. She denies side effects of her medications.   Review of Systems  Constitutional: Negative.   HENT: Negative.   Eyes: Negative.   Respiratory: Negative.   Cardiovascular: Negative.   Gastrointestinal: Negative.   Genitourinary: Negative.   Musculoskeletal: Negative.   Skin: Negative.   Neurological: Positive for headaches.  Endo/Heme/Allergies: Positive for environmental allergies.  Psychiatric/Behavioral: Negative.      OBJECTIVE: BP 117/73   Pulse 67   Temp 98.1 F (36.7 C)   Resp 16   Wt 155 lb 3.2 oz (70.4 kg)   LMP 03/27/2011   SpO2 100%   BMI 21.05 kg/m   Wt Readings from Last 3 Encounters:  05/07/18 155 lb 3.2 oz (70.4 kg)  04/23/18 155 lb 3.2 oz (70.4 kg)  04/08/18 155 lb (70.3 kg)     Physical Exam Vitals signs and nursing note reviewed.  Constitutional:      General: She is not in acute distress.    Appearance: Normal appearance.  HENT:     Head: Normocephalic and atraumatic.  Eyes:     Extraocular Movements: Extraocular movements intact.     Conjunctiva/sclera: Conjunctivae normal.     Pupils: Pupils are equal, round, and reactive to light.  Cardiovascular:     Rate and Rhythm: Normal rate and regular rhythm.     Heart sounds: No murmur.  Pulmonary:     Effort: Pulmonary effort is normal.     Breath sounds: Normal breath sounds.   Musculoskeletal: Normal range of motion.  Skin:    General: Skin is warm and dry.  Neurological:     Mental Status: She is alert and oriented to person, place, and time.  Psychiatric:        Mood and Affect: Mood normal.        Behavior: Behavior normal.        Thought Content: Thought content normal.        Judgment: Judgment normal.     ASSESSMENT/PLAN:   1. Essential hypertension - POCT URINALYSIS DIP (CLINITEK) - amLODipine (NORVASC) 5 MG tablet; Take 1 tablet (5 mg total) by mouth daily.  Dispense: 90 tablet; Refill: 3  2. Chronic tension-type headache, not intractable - cyclobenzaprine (FLEXERIL) 10 MG tablet; Take 1 tablet (10 mg total) by mouth 3 (three) times daily as needed for muscle spasms.  Dispense: 30 tablet; Refill: 0    Return in about 3 months (around 08/07/2018) for htn.    The patient was given clear instructions to go to ER or return to medical center if symptoms do not improve, worsen or new problems develop. The patient verbalized understanding and agreed with plan of care.   Ms. [name]. Tabitha Canary, FNP-BC [facility] [address] [place], [place] [address] [phone]

## 2018-05-08 ENCOUNTER — Encounter: Payer: Self-pay | Admitting: Gastroenterology

## 2018-05-08 ENCOUNTER — Other Ambulatory Visit: Payer: Self-pay

## 2018-05-08 ENCOUNTER — Telehealth (INDEPENDENT_AMBULATORY_CARE_PROVIDER_SITE_OTHER): Payer: Self-pay | Admitting: Gastroenterology

## 2018-05-08 DIAGNOSIS — K5909 Other constipation: Secondary | ICD-10-CM

## 2018-05-08 DIAGNOSIS — Z86010 Personal history of colonic polyps: Secondary | ICD-10-CM

## 2018-05-08 NOTE — Patient Instructions (Addendum)
If you are age 58 or older, your body mass index should be between 23-30. Your There is no height or weight on file to calculate BMI. If this is out of the aforementioned range listed, please consider follow up with your Primary Care Provider.  If you are age 46 or younger, your body mass index should be between 19-25. Your There is no height or weight on file to calculate BMI. If this is out of the aformentioned range listed, please consider follow up with your Primary Care Provider.   Miralax 17g - 1 capful daily for 1 week, then increase to 1 capful twice daily for 2 weeks.   If not effective after 4-5 days then take Dulcolax 10mg  as needed.   We will consider Trulance in the fututre, if above therapy is ineffective.    You will need labs 2-3 days prior to your next visit. Our lab is located in the basement of our office. No appointment needed. Hours are betweeen 7:30am - 5:00pm. Lab order has been sent to the lab.   You will also need a abdominal xray(KUB 2view) prior to your visit, which can also be done in Radiology located in the basement.   Follow- up in 3-5 weeks. Our office will contact you to schedule.  Thank you for choosing me and [facility].  Dr. Rush Landmark

## 2018-05-08 NOTE — Progress Notes (Signed)
[facility] VISIT   Primary Care Provider Lanae Boast, [place] [address] [phone]  Referring Provider Lanae Boast, [place],  [address] [phone]  Patient Profile: [name] is a 58 y.o. female with a pmh significant for Sickle Cell Disease, HIV, hepatitis C status post treatment and followed by ID, asthma, colon polyps (tubular adenoma).  The patient presents to the [facility] for an evaluation and management of problem(s) noted below:  Problem List 1. Chronic constipation   2. History of colon polyps     History of Present Illness: This service was provided via telemedicine and was done over the phone as they did not have access to video capability. We continued and completed visit with audio only. The patient was located at home. The provider was located in the office. The patient did consent to this telephone visit and is aware of possible charges through their insurance for this visit. The patient was referred by Lanae Boast. The other persons participating in this telemedicine service were none. Time spent on call was 23 minutes.  The patient states that she has been dealing with constipation for years.  She previously saw [facility] and underwent a colonoscopy in 2018 and then underwent a virtual colonoscopy due to the tortuosity of her colon..  She was found to have a tubular adenoma but no structural etiology for her chronic constipation.  She reports being given a medication starting with the letter L she believes Linzess.  She actually did not use it on a daily basis because it was samples and she could not afford the actual cost of the medication and should try to spread out as long as possible.  This was helpful for her.  However she has stopped using this as she ran out of samples and never went back to that provider because of insurance issues.   She has bowel movements on an infrequent basis and can go days at a time without a bowel movement.  She has been trialed on Miralax on the past as well as having used Ex-Lax but it is not clear that she has had any real effectiveness with these for a profound amount of time.  She has experienced bright red blood per rectum on only one occasion when she was significantly straining but does not have this on a regular basis.  She also describes issues of pyrosis especially if she eats anything spicy or has an onion or cabbage.  She also infrequently will get a dyspepsia/indigestion that occurs after eating certain foods.  She tries to stay away from those foods.  This has not been an issue more currently.  She does not take nonsteroidals or BC/Goody powders on a regular basis.  She is never had an upper endoscopy.  GI Review of Systems Positive as above Negative for dysphagia, odynophagia, jaundice, nausea, vomiting, melena  Review of Systems General: Denies fevers/chills/weight loss HEENT: Denies oral lesions Cardiovascular: Denies chest pain Pulmonary: Denies shortness of breath/nocturnal cough Gastroenterological: See HPI Genitourinary: Describes issues of dysuria at times Hematological: Denies easy bruising/bleeding Endocrine: Denies temperature intolerance Dermatological: Denies jaundice Psychological: Mood is stable   Medications Current Outpatient Medications  Medication Sig Dispense Refill   acetaminophen (TYLENOL) 500 MG tablet Take 1 tablet (500 mg total) by mouth every 6 (six) hours as needed. 30 tablet 0   amLODipine (NORVASC) 5 MG tablet Take 1 tablet (5 mg  total) by mouth daily. 90 tablet 3   bictegravir-emtricitabine-tenofovir AF (BIKTARVY) 50-200-25 MG TABS tablet Take 1 tablet by mouth daily. 30 tablet 11   cyclobenzaprine (FLEXERIL) 10 MG tablet Take 1 tablet (10 mg total) by mouth 3 (three) times daily as needed for muscle spasms. 30 tablet 0   ENSURE (ENSURE) Take 1  Can by mouth 3 (three) times daily between meals. Drink one can once to twice daily 240 mL 11   fluticasone (FLONASE) 50 MCG/ACT nasal spray Place 2 sprays into both nostrils daily. 16 g 6   PARoxetine (PAXIL) 20 MG tablet TAKE 1 TABLET BY MOUTH EVERY DAY (Patient not taking: Reported on 04/23/2018) 90 tablet 1   No current facility-administered medications for this visit.     Allergies Allergies  Allergen Reactions   Aspirin Other (See Comments)    heart murmur    Histories Past Medical History:  Diagnosis Date   Asthma    Hepatitis C    HIV (human immunodeficiency virus infection) ([place])    Past Surgical History:  Procedure Laterality Date   COLONOSCOPY WITH PROPOFOL N/A 06/26/2016   Procedure: COLONOSCOPY WITH PROPOFOL;  Surgeon: Otis Brace, MD;  Location: [facility];  Service: Gastroenterology;  Laterality: N/A;   Social History   Socioeconomic History   Marital status: Single    Spouse name: Not on file   Number of children: Not on file   Years of education: Not on file   Highest education level: Not on file  Occupational History   Not on file  Social Needs   Financial resource strain: Not on file   Food insecurity:    Worry: Not on file    Inability: Not on file   Transportation needs:    Medical: Not on file    Non-medical: Not on file  Tobacco Use   Smoking status: Current Every Day Smoker    Packs/day: 0.50    Years: 33.00    Pack years: 16.50    Types: Cigarettes   Smokeless tobacco: Never Used   Tobacco comment: cutting back  Substance and Sexual Activity   Alcohol use: No    Alcohol/week: 0.0 standard drinks    Comment: recovered alcoholic for 10 years   Drug use: No    Comment: clean for 10 years   Sexual activity: Never    Comment: pt. given condoms  Lifestyle   Physical activity:    Days per week: Not on file    Minutes per session: Not on file   Stress: Not on file  Relationships   Social connections:     Talks on phone: Not on file    Gets together: Not on file    Attends religious service: Not on file    Active member of club or organization: Not on file    Attends meetings of clubs or organizations: Not on file    Relationship status: Not on file   Intimate partner violence:    Fear of current or ex partner: Not on file    Emotionally abused: Not on file    Physically abused: Not on file    Forced sexual activity: Not on file  Other Topics Concern   Not on file  Social History Narrative   Not on file   Family History  Problem Relation Age of Onset   Cancer Mother        bone   Diabetes Sister    Colon cancer Maternal Uncle    Esophageal  cancer Neg Hx    Gastric cancer Neg Hx    Pancreatic cancer Neg Hx    Liver disease Neg Hx    Inflammatory bowel disease Neg Hx    I have reviewed her medical, social, and family history in detail and updated the electronic medical record as necessary.    PHYSICAL EXAMINATION  Telehealth Visit   REVIEW OF DATA  I reviewed the following data at the time of this encounter:  GI Procedures and Studies  2018 colonoscopy - Preparation of the colon was fair. - One 4 mm polyp in the transverse colon, removed with a cold biopsy forceps. Resected and retrieved. - Tortuous colon. - Internal hemorrhoids.  Laboratory Studies  Reviewed in epic  Imaging Studies  2018 colonoscopy virtual IMPRESSION: No fixed polypoid filling defects or annular constricting lesions. There is under distention of the sigmoid colon on both supine and prone imaging. Areas of high density noted in the upper pole of the left kidney of unknown etiology. These may reflect hemorrhagic cysts. This could be further evaluated with renal ultrasound.   ASSESSMENT  [name] is a 58 y.o. female with a pmh significant for Sickle Cell Disease, HIV, hepatitis C status post treatment and followed by ID, asthma, colon polyps (tubular adenoma).  The patient is seen  today for evaluation and management of:  1. Chronic constipation   2. History of colon polyps    The patient is clinically stable with a longstanding history of chronic constipation.  The patient has already undergone a colonoscopy and then virtual colonoscopy in 2018 showing no obstructive lesions.  She does have tubular adenoma history and will require a follow-up colonoscopy in 5 years from 2018 which would be 2023 as result of her fair preparation.  At that time it should be a 1 hour procedure to attempt to complete colonoscopy.  We will initiate the patient back on MiraLAX once daily and over the course of the first 1 to 2 weeks increase that to twice daily.  If not effective after 4 to 5 days she will take Dulcolax 10 mg as needed.  We will consider the use of true Lance or Linzess in the future as samples and potentially as a patient assistance program based on how she does in the course of the next 3 to 5 weeks and we can perform as a virtual visit as necessary.  I would like to obtain a KUB however to further evaluate for overall stool in the abdomen and she can have that done before the next visit.  She will need laboratories to rule out other potential metabolic etiologies for constipation and this should be done before the next visit as well.  All patient questions were answered, to the best of my ability, and the patient agrees to the aforementioned plan of action with follow-up as indicated.   PLAN  Laboratories as outlined below KUB prior to next visit MiraLAX once daily and titrate up to twice daily dosing If no bowel movement after 4 days she should initiate Ex-Lax We will consider the use of true Lance or Linzess and hope to get a patient assistance due to her inability to afford based on last prescription   Orders Placed This Encounter  Procedures   DG Abd 2 Views   TSH   Calcium, ionized    New Prescriptions   No medications on file   Modified Medications   No  medications on file    Planned Follow Up:  No follow-ups on file.   Justice Britain, MD [facility] Advanced Endoscopy Office # [phone]

## 2018-06-02 ENCOUNTER — Telehealth: Payer: Self-pay

## 2018-06-02 MED ORDER — CYCLOBENZAPRINE HCL 10 MG PO TABS: 10.0000 mg | ORAL_TABLET | Freq: Three times a day (TID) | ORAL | 0 refills | Status: DC | PRN

## 2018-06-02 MED FILL — CYCLOBENZAPRINE 10 MG TAB: 10 | 10 days supply | Qty: 30 | Fill #0

## 2018-06-02 NOTE — Telephone Encounter (Signed)
Patient has been unable to receive flexeril from pharmacy due to insurance not covering it. [facility] This has been sent in today. Thanks!

## 2018-06-03 ENCOUNTER — Ambulatory Visit: Payer: Self-pay | Admitting: Family Medicine

## 2018-06-03 ENCOUNTER — Other Ambulatory Visit: Payer: Self-pay | Admitting: Behavioral Health

## 2018-06-03 ENCOUNTER — Other Ambulatory Visit: Payer: Self-pay

## 2018-06-03 VITALS — BP 116/82 | HR 74 | Temp 98.0°F | Ht 72.0 in | Wt 155.0 lb

## 2018-06-03 DIAGNOSIS — B20 Human immunodeficiency virus [HIV] disease: Secondary | ICD-10-CM

## 2018-06-03 DIAGNOSIS — Z0130 Encounter for examination of blood pressure without abnormal findings: Secondary | ICD-10-CM

## 2018-06-03 MED ORDER — ENSURE PO LIQD: 1.0000 | Freq: Three times a day (TID) | ORAL | 11 refills | Status: DC

## 2018-07-20 ENCOUNTER — Encounter (HOSPITAL_COMMUNITY)
Admission: EM | Disposition: A | Discharge status: Home / Self Care | Payer: Self-pay | Source: Home / Self Care | Attending: Emergency Medicine

## 2018-07-20 ENCOUNTER — Emergency Department (HOSPITAL_COMMUNITY): Payer: Self-pay | Admitting: Certified Registered Nurse Anesthetist

## 2018-07-20 ENCOUNTER — Observation Stay (HOSPITAL_COMMUNITY)
Admission: EM | Admit: 2018-07-20 | Discharge: 2018-07-21 | Disposition: A | Discharge status: Home / Self Care | Payer: Self-pay | Attending: Surgery | Admitting: Surgery

## 2018-07-20 ENCOUNTER — Other Ambulatory Visit: Payer: Self-pay

## 2018-07-20 ENCOUNTER — Encounter (HOSPITAL_COMMUNITY): Payer: Self-pay

## 2018-07-20 ENCOUNTER — Emergency Department (HOSPITAL_COMMUNITY): Payer: Self-pay

## 2018-07-20 DIAGNOSIS — K3580 Unspecified acute appendicitis: Principal | ICD-10-CM | POA: Insufficient documentation

## 2018-07-20 DIAGNOSIS — I10 Essential (primary) hypertension: Secondary | ICD-10-CM | POA: Insufficient documentation

## 2018-07-20 DIAGNOSIS — K219 Gastro-esophageal reflux disease without esophagitis: Secondary | ICD-10-CM | POA: Insufficient documentation

## 2018-07-20 DIAGNOSIS — F17210 Nicotine dependence, cigarettes, uncomplicated: Secondary | ICD-10-CM | POA: Insufficient documentation

## 2018-07-20 DIAGNOSIS — Z1159 Encounter for screening for other viral diseases: Secondary | ICD-10-CM | POA: Insufficient documentation

## 2018-07-20 DIAGNOSIS — Z21 Asymptomatic human immunodeficiency virus [HIV] infection status: Secondary | ICD-10-CM | POA: Insufficient documentation

## 2018-07-20 HISTORY — PX: LAPAROSCOPIC APPENDECTOMY: SHX408

## 2018-07-20 LAB — CBC WITH DIFFERENTIAL/PLATELET
Abs Immature Granulocytes: 0.03 10*3/uL (ref 0.00–0.07)
Basophils Absolute: 0 10*3/uL (ref 0.0–0.1)
Basophils Relative: 0 %
Eosinophils Absolute: 0 10*3/uL (ref 0.0–0.5)
Eosinophils Relative: 0 %
HCT: 39.8 % (ref 36.0–46.0)
Hemoglobin: 12.9 g/dL (ref 12.0–15.0)
Immature Granulocytes: 0 %
Lymphocytes Relative: 12 %
Lymphs Abs: 1.2 10*3/uL (ref 0.7–4.0)
MCH: 29.1 pg (ref 26.0–34.0)
MCHC: 32.4 g/dL (ref 30.0–36.0)
MCV: 89.8 fL (ref 80.0–100.0)
Monocytes Absolute: 0.7 10*3/uL (ref 0.1–1.0)
Monocytes Relative: 7 %
Neutro Abs: 8.1 10*3/uL — ABNORMAL HIGH (ref 1.7–7.7)
Neutrophils Relative %: 81 %
Platelets: 197 10*3/uL (ref 150–400)
RBC: 4.43 MIL/uL (ref 3.87–5.11)
RDW: 12 % (ref 11.5–15.5)
WBC: 10.1 10*3/uL (ref 4.0–10.5)
nRBC: 0 % (ref 0.0–0.2)

## 2018-07-20 LAB — COMPREHENSIVE METABOLIC PANEL
ALT: 14 U/L (ref 0–44)
AST: 22 U/L (ref 15–41)
Albumin: 3.9 g/dL (ref 3.5–5.0)
Alkaline Phosphatase: 109 U/L (ref 38–126)
Anion gap: 10 (ref 5–15)
BUN: 9 mg/dL (ref 6–20)
CO2: 25 mmol/L (ref 22–32)
Calcium: 9.4 mg/dL (ref 8.9–10.3)
Chloride: 102 mmol/L (ref 98–111)
Creatinine, Ser: 1.02 mg/dL — ABNORMAL HIGH (ref 0.44–1.00)
GFR calc Af Amer: >60 mL/min (ref >60)
GFR calc non Af Amer: >60 mL/min (ref >60)
Glucose, Bld: 102 mg/dL — ABNORMAL HIGH (ref 70–99)
Potassium: 3.5 mmol/L (ref 3.5–5.1)
Sodium: 137 mmol/L (ref 135–145)
Total Bilirubin: 0.8 mg/dL (ref 0.3–1.2)
Total Protein: 7.9 g/dL (ref 6.5–8.1)

## 2018-07-20 LAB — URINALYSIS, ROUTINE W REFLEX MICROSCOPIC
Bilirubin Urine: NEGATIVE
Glucose, UA: NEGATIVE mg/dL
Ketones, ur: NEGATIVE mg/dL
Leukocytes,Ua: NEGATIVE
Nitrite: NEGATIVE
Protein, ur: NEGATIVE mg/dL
Specific Gravity, Urine: 1.017 (ref 1.005–1.030)
pH: 7 (ref 5.0–8.0)

## 2018-07-20 LAB — LIPASE, BLOOD: Lipase: 21 U/L (ref 11–51)

## 2018-07-20 LAB — SARS CORONAVIRUS 2 BY RT PCR (HOSPITAL ORDER, PERFORMED IN ~~LOC~~ HOSPITAL LAB): SARS Coronavirus 2: NEGATIVE

## 2018-07-20 SURGERY — APPENDECTOMY, LAPAROSCOPIC
Anesthesia: General | Site: Abdomen

## 2018-07-20 MED ORDER — MEPERIDINE HCL 25 MG/ML IJ SOLN: 6.2500 mg | INTRAMUSCULAR | Status: DC | PRN

## 2018-07-20 MED ORDER — PROPOFOL 10 MG/ML IV BOLUS
INTRAVENOUS | Status: DC | PRN
  Administered 2018-07-20: 120 mg via INTRAVENOUS

## 2018-07-20 MED ORDER — DEXAMETHASONE SODIUM PHOSPHATE 10 MG/ML IJ SOLN
INTRAMUSCULAR | Status: AC
  Filled 2018-07-20: qty 1

## 2018-07-20 MED ORDER — FENTANYL CITRATE (PF) 250 MCG/5ML IJ SOLN
INTRAMUSCULAR | Status: DC | PRN
  Administered 2018-07-20 (×3): 50 ug via INTRAVENOUS
  Administered 2018-07-20: 100 ug via INTRAVENOUS

## 2018-07-20 MED ORDER — MIDAZOLAM HCL 2 MG/2ML IJ SOLN
INTRAMUSCULAR | Status: DC | PRN
  Administered 2018-07-20: 2 mg via INTRAVENOUS

## 2018-07-20 MED ORDER — HYDRALAZINE HCL 20 MG/ML IJ SOLN: 10.0000 mg | INTRAMUSCULAR | Status: DC | PRN

## 2018-07-20 MED ORDER — BUPIVACAINE-EPINEPHRINE 0.25% -1:200000 IJ SOLN
INTRAMUSCULAR | Status: DC | PRN
  Administered 2018-07-20: 22 mL

## 2018-07-20 MED ORDER — HYDROMORPHONE HCL 1 MG/ML IJ SOLN
0.5000 mg | INTRAMUSCULAR | Status: DC | PRN
  Administered 2018-07-21: 0.5 mg via INTRAVENOUS
  Filled 2018-07-20: qty 1

## 2018-07-20 MED ORDER — ONDANSETRON HCL 4 MG/2ML IJ SOLN
4.0000 mg | Freq: Once | INTRAMUSCULAR | Status: AC
  Administered 2018-07-20: 15:00:00 4 mg via INTRAVENOUS
  Filled 2018-07-20: qty 2

## 2018-07-20 MED ORDER — SODIUM CHLORIDE 0.9 % IV SOLN
INTRAVENOUS | Status: DC
  Administered 2018-07-21: via INTRAVENOUS

## 2018-07-20 MED ORDER — 0.9 % SODIUM CHLORIDE (POUR BTL) OPTIME
TOPICAL | Status: DC | PRN
  Administered 2018-07-20: 1000 mL

## 2018-07-20 MED ORDER — ONDANSETRON HCL 4 MG/2ML IJ SOLN: 4.0000 mg | Freq: Four times a day (QID) | INTRAMUSCULAR | Status: DC | PRN

## 2018-07-20 MED ORDER — PHENYLEPHRINE 40 MCG/ML (10ML) SYRINGE FOR IV PUSH (FOR BLOOD PRESSURE SUPPORT)
PREFILLED_SYRINGE | INTRAVENOUS | Status: AC
  Filled 2018-07-20: qty 10

## 2018-07-20 MED ORDER — MIDAZOLAM HCL 2 MG/2ML IJ SOLN
INTRAMUSCULAR | Status: AC
  Filled 2018-07-20: qty 2

## 2018-07-20 MED ORDER — MORPHINE SULFATE (PF) 4 MG/ML IV SOLN
4.0000 mg | Freq: Once | INTRAVENOUS | Status: AC
  Administered 2018-07-20: 4 mg via INTRAVENOUS
  Filled 2018-07-20: qty 1

## 2018-07-20 MED ORDER — TRAMADOL HCL 50 MG PO TABS
50.0000 mg | ORAL_TABLET | Freq: Four times a day (QID) | ORAL | Status: DC | PRN
  Administered 2018-07-21: 03:00:00 50 mg via ORAL
  Filled 2018-07-20: qty 1

## 2018-07-20 MED ORDER — SODIUM CHLORIDE 0.9 % IV BOLUS
1000.0000 mL | Freq: Once | INTRAVENOUS | Status: AC
  Administered 2018-07-20: 1000 mL via INTRAVENOUS

## 2018-07-20 MED ORDER — SODIUM CHLORIDE 0.9 % IR SOLN
Status: DC | PRN
  Administered 2018-07-20: 1000 mL

## 2018-07-20 MED ORDER — ONDANSETRON 4 MG PO TBDP: 4.0000 mg | ORAL_TABLET | Freq: Four times a day (QID) | ORAL | Status: DC | PRN

## 2018-07-20 MED ORDER — DIPHENHYDRAMINE HCL 25 MG PO CAPS: 25.0000 mg | ORAL_CAPSULE | Freq: Four times a day (QID) | ORAL | Status: DC | PRN

## 2018-07-20 MED ORDER — EPHEDRINE SULFATE 50 MG/ML IJ SOLN
INTRAMUSCULAR | Status: DC | PRN
  Administered 2018-07-20: 5 mg via INTRAVENOUS

## 2018-07-20 MED ORDER — IBUPROFEN 600 MG PO TABS: 600.0000 mg | ORAL_TABLET | Freq: Four times a day (QID) | ORAL | Status: DC | PRN

## 2018-07-20 MED ORDER — LIDOCAINE 2% (20 MG/ML) 5 ML SYRINGE
INTRAMUSCULAR | Status: AC
  Filled 2018-07-20: qty 5

## 2018-07-20 MED ORDER — ONDANSETRON HCL 4 MG/2ML IJ SOLN
INTRAMUSCULAR | Status: AC
  Filled 2018-07-20: qty 4

## 2018-07-20 MED ORDER — ONDANSETRON HCL 4 MG/2ML IJ SOLN: 4.0000 mg | Freq: Once | INTRAMUSCULAR | Status: DC | PRN

## 2018-07-20 MED ORDER — ENOXAPARIN SODIUM 40 MG/0.4ML ~~LOC~~ SOLN: 40.0000 mg | Freq: Every day | SUBCUTANEOUS | Status: DC

## 2018-07-20 MED ORDER — BICTEGRAVIR-EMTRICITAB-TENOFOV 50-200-25 MG PO TABS
1.0000 | ORAL_TABLET | Freq: Every day | ORAL | Status: DC
  Administered 2018-07-21: 1 via ORAL
  Filled 2018-07-20: qty 1

## 2018-07-20 MED ORDER — METOPROLOL TARTRATE 5 MG/5ML IV SOLN: 5.0000 mg | Freq: Four times a day (QID) | INTRAVENOUS | Status: DC | PRN

## 2018-07-20 MED ORDER — LACTATED RINGERS IV SOLN
INTRAVENOUS | Status: DC | PRN
  Administered 2018-07-20 (×3): via INTRAVENOUS

## 2018-07-20 MED ORDER — DEXAMETHASONE SODIUM PHOSPHATE 10 MG/ML IJ SOLN
INTRAMUSCULAR | Status: DC | PRN
  Administered 2018-07-20: 10 mg via INTRAVENOUS

## 2018-07-20 MED ORDER — DOCUSATE SODIUM 100 MG PO CAPS
100.0000 mg | ORAL_CAPSULE | Freq: Two times a day (BID) | ORAL | Status: DC
  Administered 2018-07-21: 100 mg via ORAL
  Filled 2018-07-20: qty 1

## 2018-07-20 MED ORDER — AMLODIPINE BESYLATE 5 MG PO TABS
5.0000 mg | ORAL_TABLET | Freq: Every day | ORAL | Status: DC
  Administered 2018-07-21: 5 mg via ORAL
  Filled 2018-07-20: qty 1

## 2018-07-20 MED ORDER — HYDROMORPHONE HCL 1 MG/ML IJ SOLN
0.2500 mg | INTRAMUSCULAR | Status: DC | PRN
  Administered 2018-07-20: 0.5 mg via INTRAVENOUS

## 2018-07-20 MED ORDER — SODIUM CHLORIDE 0.9 % IV SOLN
2.0000 g | INTRAVENOUS | Status: DC
  Administered 2018-07-21: 2 g via INTRAVENOUS
  Filled 2018-07-20: qty 20

## 2018-07-20 MED ORDER — ACETAMINOPHEN 500 MG PO TABS
1000.0000 mg | ORAL_TABLET | Freq: Four times a day (QID) | ORAL | Status: DC
  Administered 2018-07-21 (×3): 1000 mg via ORAL
  Filled 2018-07-20 (×3): qty 2

## 2018-07-20 MED ORDER — FENTANYL CITRATE (PF) 250 MCG/5ML IJ SOLN
INTRAMUSCULAR | Status: AC
  Filled 2018-07-20: qty 5

## 2018-07-20 MED ORDER — PHENYLEPHRINE HCL (PRESSORS) 10 MG/ML IV SOLN
INTRAVENOUS | Status: DC | PRN
  Administered 2018-07-20: 80 ug via INTRAVENOUS
  Administered 2018-07-20: 40 ug via INTRAVENOUS

## 2018-07-20 MED ORDER — METRONIDAZOLE IN NACL 5-0.79 MG/ML-% IV SOLN
500.0000 mg | Freq: Three times a day (TID) | INTRAVENOUS | Status: DC
  Administered 2018-07-21 (×2): 500 mg via INTRAVENOUS
  Filled 2018-07-20 (×2): qty 100

## 2018-07-20 MED ORDER — HYDROMORPHONE HCL 1 MG/ML IJ SOLN
INTRAMUSCULAR | Status: AC
  Filled 2018-07-20: qty 1

## 2018-07-20 MED ORDER — SUGAMMADEX SODIUM 200 MG/2ML IV SOLN
INTRAVENOUS | Status: DC | PRN
  Administered 2018-07-20: 200 mg via INTRAVENOUS

## 2018-07-20 MED ORDER — SUCCINYLCHOLINE CHLORIDE 200 MG/10ML IV SOSY
PREFILLED_SYRINGE | INTRAVENOUS | Status: DC | PRN
  Administered 2018-07-20: 120 mg via INTRAVENOUS

## 2018-07-20 MED ORDER — ROCURONIUM BROMIDE 10 MG/ML (PF) SYRINGE
PREFILLED_SYRINGE | INTRAVENOUS | Status: DC | PRN
  Administered 2018-07-20: 50 mg via INTRAVENOUS

## 2018-07-20 MED ORDER — LIDOCAINE 2% (20 MG/ML) 5 ML SYRINGE
INTRAMUSCULAR | Status: DC | PRN
  Administered 2018-07-20: 60 mg via INTRAVENOUS

## 2018-07-20 MED ORDER — SUCCINYLCHOLINE CHLORIDE 200 MG/10ML IV SOSY
PREFILLED_SYRINGE | INTRAVENOUS | Status: AC
  Filled 2018-07-20: qty 30

## 2018-07-20 MED ORDER — DIPHENHYDRAMINE HCL 50 MG/ML IJ SOLN: 25.0000 mg | Freq: Four times a day (QID) | INTRAMUSCULAR | Status: DC | PRN

## 2018-07-20 MED ORDER — BUPIVACAINE-EPINEPHRINE (PF) 0.25% -1:200000 IJ SOLN
INTRAMUSCULAR | Status: AC
  Filled 2018-07-20: qty 30

## 2018-07-20 MED ORDER — SODIUM CHLORIDE 0.9 % IV SOLN
2.0000 g | Freq: Once | INTRAVENOUS | Status: AC
  Administered 2018-07-20: 17:00:00 2 g via INTRAVENOUS
  Filled 2018-07-20: qty 20

## 2018-07-20 MED ORDER — PROPOFOL 10 MG/ML IV BOLUS
INTRAVENOUS | Status: AC
  Filled 2018-07-20: qty 20

## 2018-07-20 MED ORDER — CYCLOBENZAPRINE HCL 10 MG PO TABS: 10.0000 mg | ORAL_TABLET | Freq: Three times a day (TID) | ORAL | Status: DC | PRN

## 2018-07-20 MED ORDER — IOHEXOL 300 MG/ML  SOLN
100.0000 mL | Freq: Once | INTRAMUSCULAR | Status: AC | PRN
  Administered 2018-07-20: 100 mL via INTRAVENOUS

## 2018-07-20 MED ORDER — ONDANSETRON HCL 4 MG/2ML IJ SOLN
INTRAMUSCULAR | Status: AC
  Filled 2018-07-20: qty 2

## 2018-07-20 MED ORDER — METRONIDAZOLE IN NACL 5-0.79 MG/ML-% IV SOLN: 500.0000 mg | Freq: Once | INTRAVENOUS | Status: DC

## 2018-07-20 SURGICAL SUPPLY — 50 items
ADH SKN CLS APL DERMABOND .7 (GAUZE/BANDAGES/DRESSINGS) ×1
ADH SKN CLS LQ APL DERMABOND (GAUZE/BANDAGES/DRESSINGS) ×1
APPLIER CLIP 5 13 M/L LIGAMAX5 (MISCELLANEOUS)
APR CLP MED LRG 5 ANG JAW (MISCELLANEOUS)
BAG SPEC RTRVL LRG 6X4 10 (ENDOMECHANICALS) ×1
BLADE CLIPPER SURG (BLADE) IMPLANT
CANISTER SUCT 3000ML PPV (MISCELLANEOUS) ×3 IMPLANT
CHLORAPREP W/TINT 26ML (MISCELLANEOUS) ×3 IMPLANT
CLIP APPLIE 5 13 M/L LIGAMAX5 (MISCELLANEOUS) IMPLANT
COVER SURGICAL LIGHT HANDLE (MISCELLANEOUS) ×3 IMPLANT
COVER WAND RF STERILE (DRAPES) ×3 IMPLANT
CUTTER FLEX LINEAR 45M (STAPLE) ×3 IMPLANT
DERMABOND ADHESIVE PROPEN (GAUZE/BANDAGES/DRESSINGS) ×2
DERMABOND ADVANCED (GAUZE/BANDAGES/DRESSINGS) ×2
DERMABOND ADVANCED .7 DNX12 (GAUZE/BANDAGES/DRESSINGS) ×1 IMPLANT
DERMABOND ADVANCED .7 DNX6 (GAUZE/BANDAGES/DRESSINGS) IMPLANT
DEVICE PMI PUNCTURE CLOSURE (MISCELLANEOUS) ×3 IMPLANT
ELECT REM PT RETURN 9FT ADLT (ELECTROSURGICAL) ×3
ELECTRODE REM PT RTRN 9FT ADLT (ELECTROSURGICAL) ×1 IMPLANT
GLOVE BIO SURGEON STRL SZ 6 (GLOVE) ×3 IMPLANT
GLOVE INDICATOR 6.5 STRL GRN (GLOVE) ×3 IMPLANT
GOWN STRL REUS W/ TWL LRG LVL3 (GOWN DISPOSABLE) ×3 IMPLANT
GOWN STRL REUS W/TWL LRG LVL3 (GOWN DISPOSABLE) ×6
KIT BASIN OR (CUSTOM PROCEDURE TRAY) ×3 IMPLANT
KIT TURNOVER KIT B (KITS) ×3 IMPLANT
NDL INSUFFLATION 14GA 120MM (NEEDLE) ×1 IMPLANT
NEEDLE INSUFFLATION 14GA 120MM (NEEDLE) ×3 IMPLANT
NS IRRIG 1000ML POUR BTL (IV SOLUTION) ×3 IMPLANT
PAD ARMBOARD 7.5X6 YLW CONV (MISCELLANEOUS) ×6 IMPLANT
POUCH SPECIMEN RETRIEVAL 10MM (ENDOMECHANICALS) ×3 IMPLANT
RELOAD 45 VASCULAR/THIN (ENDOMECHANICALS) IMPLANT
RELOAD STAPLE 45 2.5 WHT GRN (ENDOMECHANICALS) IMPLANT
RELOAD STAPLE 60 2.6 WHT THN (STAPLE) IMPLANT
RELOAD STAPLE TA45 3.5 REG BLU (ENDOMECHANICALS) ×3 IMPLANT
RELOAD STAPLER WHITE 60MM (STAPLE) ×1 IMPLANT
SCISSORS ENDO CVD 5DCS (MISCELLANEOUS) IMPLANT
SET IRRIG TUBING LAPAROSCOPIC (IRRIGATION / IRRIGATOR) ×3 IMPLANT
SET TUBE SMOKE EVAC HIGH FLOW (TUBING) ×3 IMPLANT
SHEARS HARMONIC ACE PLUS 36CM (ENDOMECHANICALS) IMPLANT
SLEEVE ENDOPATH XCEL 5M (ENDOMECHANICALS) ×5 IMPLANT
SPECIMEN JAR SMALL (MISCELLANEOUS) ×3 IMPLANT
STAPLE ECHEON FLEX 60 POW ENDO (STAPLE) ×2 IMPLANT
STAPLER RELOAD WHITE 60MM (STAPLE) ×3
SUT MNCRL AB 4-0 PS2 18 (SUTURE) ×3 IMPLANT
TOWEL OR 17X24 6PK STRL BLUE (TOWEL DISPOSABLE) ×3 IMPLANT
TRAY FOLEY CATH SILVER 16FR (SET/KITS/TRAYS/PACK) ×3 IMPLANT
TRAY LAPAROSCOPIC MC (CUSTOM PROCEDURE TRAY) ×3 IMPLANT
TROCAR XCEL 12X100 BLDLESS (ENDOMECHANICALS) ×3 IMPLANT
TROCAR XCEL NON-BLD 5MMX100MML (ENDOMECHANICALS) ×3 IMPLANT
WATER STERILE IRR 1000ML POUR (IV SOLUTION) ×3 IMPLANT

## 2018-07-20 NOTE — H&P (Signed)
Surgical H&P  CC: abdominal pain  HPI: 58 year old woman with history of HIV, hypertension (recently started on medication), GERD, polysubstance abuse who presented to the emergency room with a <24-hour history of abdominal pain.  She describes this as gradual onset starting last night and is of a crampy quality associated with nausea and nonbloody nonbilious emesis.  She also expresses urinary frequency and does endorse constipation.  She notes associated chills but has not taken her temperature or noticed a fever.  She reports that she is compliant with her HIV medications.  Allergies  Allergen Reactions  . Aspirin Other (See Comments)    heart murmur    Past Medical History:  Diagnosis Date  . Asthma   . Hepatitis C   . HIV (human immunodeficiency virus infection) ([place])     Past Surgical History:  Procedure Laterality Date  . COLONOSCOPY WITH PROPOFOL N/A 06/26/2016   Procedure: COLONOSCOPY WITH PROPOFOL;  Surgeon: Otis Brace, MD;  Location: [facility];  Service: Gastroenterology;  Laterality: N/A;    Family History  Problem Relation Age of Onset  . Cancer Mother        bone  . Diabetes Sister   . Colon cancer Maternal Uncle   . Esophageal cancer Neg Hx   . Gastric cancer Neg Hx   . Pancreatic cancer Neg Hx   . Liver disease Neg Hx   . Inflammatory bowel disease Neg Hx     Social History   Socioeconomic History  . Marital status: Single    Spouse name: Not on file  . Number of children: Not on file  . Years of education: Not on file  . Highest education level: Not on file  Occupational History  . Not on file  Social Needs  . Financial resource strain: Not on file  . Food insecurity:    Worry: Not on file    Inability: Not on file  . Transportation needs:    Medical: Not on file    Non-medical: Not on file  Tobacco Use  . Smoking status: Current Every Day Smoker    Packs/day: 0.50    Years: 33.00    Pack years: 16.50    Types: Cigarettes  .  Smokeless tobacco: Never Used  . Tobacco comment: cutting back  Substance and Sexual Activity  . Alcohol use: No    Alcohol/week: 0.0 standard drinks    Comment: recovered alcoholic for 10 years  . 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 Sexual activity: Never    Comment: pt. given condoms  Lifestyle  . Physical activity:    Days per week: Not on file    Minutes per session: Not on file  . Stress: Not on file  Relationships  . Social connections:    Talks on phone: Not on file    Gets together: Not on file    Attends religious service: Not on file    Active member of club or organization: Not on file    Attends meetings of clubs or organizations: Not on file    Relationship status: Not on file  Other Topics Concern  . Not on file  Social History Narrative  . Not on file    No current facility-administered medications on file prior to encounter.    Current Outpatient Medications on File Prior to Encounter  Medication Sig Dispense Refill  . amLODipine (NORVASC) 5 MG tablet Take 1 tablet (5 mg total) by mouth daily. [place]  tablet 3  . bictegravir-emtricitabine-tenofovir AF (BIKTARVY) 50-200-25 MG TABS tablet Take 1 tablet by mouth daily. 30 tablet 11  . cyclobenzaprine (FLEXERIL) 10 MG tablet Take 1 tablet (10 mg total) by mouth 3 (three) times daily as needed for muscle spasms. 30 tablet 0  . Ensure (ENSURE) Take 1 Can by mouth 3 (three) times daily between meals. 240 mL 11  . fluticasone (FLONASE) 50 MCG/ACT nasal spray Place 2 sprays into both nostrils daily. 16 g 6  . acetaminophen (TYLENOL) 500 MG tablet Take 1 tablet (500 mg total) by mouth every 6 (six) hours as needed. (Patient taking differently: Take 1,000 mg by mouth every 6 (six) hours as needed for moderate pain (abdominal pain). ) 30 tablet 0  . PARoxetine (PAXIL) 20 MG tablet TAKE 1 TABLET BY MOUTH EVERY DAY (Patient not taking: Reported on 04/23/2018) 90 tablet 1    Review of Systems: a complete, 10pt review  of systems was completed with pertinent positives and negatives as documented in the HPI  Physical Exam: Vitals:   07/20/18 1515 07/20/18 1530  BP: (!) 166/88 (!) 162/93  Pulse: 79 78  Resp:    Temp:    SpO2: 99% 99%   Gen: A&Ox3, no distress  Head: normocephalic, atraumatic Eyes: extraocular motions intact, anicteric.  Neck: supple without mass or thyromegaly Chest: unlabored respirations, symmetrical air entry, clear bilaterally   Cardiovascular: RRR with palpable distal pulses, no pedal edema Abdomen: soft, nondistended, tender in the lower midline and right lower quadrant. No mass or organomegaly.  There are well-healed scars from prior laparoscopic cholecystectomy although patient has no recollection of undergoing prior abdominal surgery. Extremities: warm, without edema, no deformities  Neuro: grossly intact Psych: appropriate mood and affect, normal insight  Skin: warm and dry   CBC Latest Ref Rng & Units 07/20/2018 03/24/2018 07/03/2017  WBC 4.0 - 10.5 K/uL 10.1 4.3 4.4  Hemoglobin 12.0 - 15.0 g/dL 12.9 11.7 11.6(L)  Hematocrit 36.0 - 46.0 % 39.8 36.7 35.3  Platelets 150 - 400 K/uL 197 165 205    CMP Latest Ref Rng & Units 07/20/2018 03/24/2018 07/03/2017  Glucose 70 - 99 mg/dL 102(H) 104(H) 103(H)  BUN 6 - 20 mg/dL 9 17 14   Creatinine 0.44 - 1.00 mg/dL 1.02(H) 1.09(H) 1.07(H)  Sodium 135 - 145 mmol/L 137 140 139  Potassium 3.5 - 5.1 mmol/L 3.5 4.3 4.3  Chloride 98 - 111 mmol/L 102 108 105  CO2 22 - 32 mmol/L 25 25 28   Calcium 8.9 - 10.3 mg/dL 9.4 9.4 9.5  Total Protein 6.5 - 8.1 g/dL 7.9 7.4 7.3  Total Bilirubin 0.3 - 1.2 mg/dL 0.8 0.3 0.4  Alkaline Phos 38 - 126 U/L 109 - -  AST 15 - 41 U/L 22 21 19   ALT 0 - 44 U/L 14 12 11     Lab Results  Component Value Date   INR 0.96 06/24/2013    Imaging: Ct Abdomen Pelvis W Contrast  Result Date: 07/20/2018 CLINICAL DATA:  Abdominal pain, vomiting EXAM: CT ABDOMEN AND PELVIS WITH CONTRAST TECHNIQUE: Multidetector CT  imaging of the abdomen and pelvis was performed using the standard protocol following bolus administration of intravenous contrast. CONTRAST:  162mL OMNIPAQUE IOHEXOL 300 MG/ML  SOLN COMPARISON:  07/06/2016 FINDINGS: Lower chest: No acute abnormality. Hepatobiliary: No focal liver abnormality is seen. Status post cholecystectomy. No biliary dilatation. Pancreas: Unremarkable. No pancreatic ductal dilatation or surrounding inflammatory changes. Spleen: Normal in size without significant abnormality. Adrenals/Urinary Tract: Adrenal glands are unremarkable.  Kidneys are normal, without renal calculi, solid lesion, or hydronephrosis. Bladder is unremarkable. Stomach/Bowel: Stomach is within normal limits. The appendix is enlarged and fluid filled up to 1.5 cm in caliber, positioned medial and posterior to the cecal base, with appendicoliths near the ostium. No evidence of bowel wall thickening, distention, or inflammatory changes. Vascular/Lymphatic: Scattered atherosclerosis. No enlarged abdominal or pelvic lymph nodes. Reproductive: No mass or other significant abnormality. Other: No abdominal wall hernia or abnormality. No abdominopelvic ascites. Musculoskeletal: No acute or significant osseous findings. IMPRESSION: 1. The appendix is enlarged and fluid filled up to 1.5 cm in caliber, positioned medial and posterior to the cecal base, with appendicoliths near the ostium. Findings are consistent with acute appendicitis. No evidence of perforation or abscess. 2. Other chronic, incidental, and postoperative findings as detailed above. Electronically Signed   By: Eddie Candle M.D.   On: 07/20/2018 16:04      A/P: Acute appendicitis. I recommend proceeding with laparoscopic appendectomy. We discussed the surgery including risks of bleeding, infection, pain, scarring, injury to intra-abdominal structures, conversion to open surgery or more extensive resection, risk of staple line leak or delayed abscess, failure to  resolve symptoms, postoperative ileus, incisional hernia, as well as general risks of DVT/PE, pneumonia, stroke, heart attack, death. Questions were welcomed and answered to the patient's satisfaction. We'll proceed to the operating room today.    Romana Juniper, MD [facility], [place] Pager [phone]

## 2018-07-20 NOTE — Op Note (Addendum)
Operative Report  Tabitha TEEHAN 58 y.o. female  [id number]  [id number]  07/20/2018  Surgeon: Clovis Riley   Assistant: none  Procedure performed: Laparoscopic Appendectomy  Preop diagnosis: Acute appendicitis  Post-op diagnosis/intraop findings: Acute appendicitis  Specimens: appendix  EBL: minimal  Complications: none  Description of procedure: After obtaining informed consent the patient was brought to the operating room. Antibiotics and subcutaneous heparin were administered. SCD's were applied. General endotracheal anesthesia was initiated and a formal time-out was performed. The abdomen was prepped and draped in the usual sterile fashion and the abdomen was entered using an infraumbilical Veress needle and insufflated to 15 mmHg. A 5 mm trocar and camera were then introduced, the abdomen was inspected and there is no evidence of injury from our entry. A suprapubic 5 mm trocar and a left lower quadrant 12 mm trocar were introduced under direct visualization following infiltration with local. The patient was then placed in Trendelenburg and rotated to the left and the small bowel was reflected cephalad. She does have a redundant right colon as well as a redundant floppy sigmoid. The appendix was visualized: It is acutely inflamed with a purulent exudate, no evidence of perforation, a small amount of pelvic murky fluid was present. The mesoappendix was divided with the harmonic scalpel down to its base. There was a bulbous/firm quality of the cecum just at the junction of the appendix. A 7mm white load linear cutting stapler was used to excise this area of the cecum as a cecal cuff along with the appendix. Hemostasis was ensured. The appendix was removed through our 12 mm trocar. The staple line appeared viable and intact. The right lower quadrant and pelvis were irrigated and aspirated, the effluent was clear. The remainder of the abdomen was without gross abnormality. The  89mm trocar site in the left lower quadrant was closed with a 0 vicryl in the fascia under direct visualization using a PMI device. The abdomen was desufflated and all trocars removed. The skin incisions were closed with running subcuticular monocryl and Dermabond. The patient was awakened, extubated and transported to the recovery room in stable condition.   All counts were correct at the completion of the case.  I updated her sister, [name], by phone at the end of the case

## 2018-07-20 NOTE — ED Notes (Signed)
Patient transported to CT 

## 2018-07-20 NOTE — Anesthesia Postprocedure Evaluation (Signed)
Anesthesia Post Note  Patient: [name]  Procedure(s) Performed: APPENDECTOMY LAPAROSCOPIC (N/A Abdomen)     Patient location during evaluation: PACU Anesthesia Type: General Level of consciousness: awake and alert Pain management: pain level controlled Vital Signs Assessment: post-procedure vital signs reviewed and stable Respiratory status: spontaneous breathing, nonlabored ventilation, respiratory function stable and patient connected to nasal cannula oxygen Cardiovascular status: blood pressure returned to baseline and stable Postop Assessment: no apparent nausea or vomiting Anesthetic complications: no    Last Vitals:  Vitals:   07/20/18 1530 07/20/18 2319  BP: (!) 162/93 (!) 147/83  Pulse: 78 79  Resp:  12  Temp:  (!) 36.1 C  SpO2: 99% 96%    Last Pain:  Vitals:   07/20/18 2319  TempSrc:   PainSc: 0-No pain                 Treyana Sturgell DAVID

## 2018-07-20 NOTE — ED Provider Notes (Signed)
[place] EMERGENCY DEPARTMENT Provider Note   CSN: [id number] Arrival date & time: 07/20/18  1351    History   Chief Complaint Chief Complaint  Patient presents with   Abdominal Pain    HPI [name] is a 58 y.o. female.     The history is provided by the patient and medical records. No language interpreter was used.  Abdominal Pain    58 year old female with history of HIV, GERD, polysubstance abuse presenting for evaluation of abdominal pain.  Patient report gradual onset of lower abdominal pain which started last night.  Described pain as a crampy sensation with associated nausea, she has vomited twice of nonbloody nonbilious vomiting.  She endorsed feeling constipated and having difficulty having bowel movement.  She is able to pass flatus.  She also endorsed increased urinary frequency without burning on urination.  No associated fever or chills no chest pain shortness of breath productive cough.  No prior abdominal surgery.  She did recall eating some reheated food yesterday and unsure if that may have caused her symptom.  She is well controlled with her HIV.  Past Medical History:  Diagnosis Date   Asthma    Hepatitis C    HIV (human immunodeficiency virus infection) ([place])     Patient Active Problem List   Diagnosis Date Noted   Callus of foot 12/12/2016   Multiple renal cysts 08/14/2016   Constipation 11/30/2014   Essential hypertension 11/30/2014   Hot flashes, menopausal 01/27/2014   Asthma 01/23/2011   HEADACHE 04/14/2008   CHOLELITHIASIS 12/15/2007   PERIPHERAL NEUROPATHY 11/07/2007   Avulsion fracture of tooth 07/23/2007   Depression 07/24/2006   Human immunodeficiency virus (HIV) disease ([place]) 12/14/2005   MACROCYTIC ANEMIA 12/14/2005   TOBACCO ABUSE 12/14/2005   SUBSTANCE ABUSE 12/14/2005   GERD 12/14/2005   PNEUMONIA, HX OF 12/14/2005   COLPOSCOPY, HX OF 12/14/2005    Past Surgical History:    Procedure Laterality Date   COLONOSCOPY WITH PROPOFOL N/A 06/26/2016   Procedure: COLONOSCOPY WITH PROPOFOL;  Surgeon: Otis Brace, MD;  Location: [facility];  Service: Gastroenterology;  Laterality: N/A;     OB History    Gravida  4   Para  2   Term  2   Preterm      AB  2   Living  2     SAB      TAB  2   Ectopic      Multiple      Live Births               Home Medications    Prior to Admission medications   Medication Sig Start Date End Date Taking? Authorizing Provider  acetaminophen (TYLENOL) 500 MG tablet Take 1 tablet (500 mg total) by mouth every 6 (six) hours as needed. 02/20/18   Bast, Tressia Miners A, NP  amLODipine (NORVASC) 5 MG tablet Take 1 tablet (5 mg total) by mouth daily. 05/07/18   Lanae Boast, FNP  bictegravir-emtricitabine-tenofovir AF (BIKTARVY) 50-200-25 MG TABS tablet Take 1 tablet by mouth daily. 11/26/17   Kuppelweiser, Cassie L, RPH-CPP  cyclobenzaprine (FLEXERIL) 10 MG tablet Take 1 tablet (10 mg total) by mouth 3 (three) times daily as needed for muscle spasms. 06/02/18   Lanae Boast, FNP  Ensure (ENSURE) Take 1 Can by mouth 3 (three) times daily between meals. 06/03/18   Campbell Riches, MD  fluticasone (FLONASE) 50 MCG/ACT nasal spray Place 2 sprays into both nostrils daily.  04/23/18   Lanae Boast, FNP  PARoxetine (PAXIL) 20 MG tablet TAKE 1 TABLET BY MOUTH EVERY DAY Patient not taking: Reported on 04/23/2018 02/21/17   Campbell Riches, MD    Family History Family History  Problem Relation Age of Onset   Cancer Mother        bone   Diabetes Sister    Colon cancer Maternal Uncle    Esophageal cancer Neg Hx    Gastric cancer Neg Hx    Pancreatic cancer Neg Hx    Liver disease Neg Hx    Inflammatory bowel disease Neg Hx     Social History Social History   Tobacco Use   Smoking status: Current Every Day Smoker    Packs/day: 0.50    Years: 33.00    Pack years: 16.50    Types: Cigarettes   Smokeless  tobacco: Never Used   Tobacco comment: cutting back  Substance Use Topics   Alcohol use: No    Alcohol/week: 0.0 standard drinks    Comment: recovered alcoholic for 10 years   Drug use: No    Comment: clean for 10 years     Allergies   Aspirin   Review of Systems Review of Systems  Gastrointestinal: Positive for abdominal pain.  All other systems reviewed and are negative.    Physical Exam Updated Vital Signs BP (!) 173/98    Pulse 85    Temp 98.7 F (37.1 C) (Oral)    Resp 16    Ht 6' (1.829 m)    Wt 68.9 kg    LMP 03/27/2011    SpO2 99%    BMI 20.61 kg/m   Physical Exam Vitals signs and nursing note reviewed.  Constitutional:      General: She is not in acute distress.    Appearance: She is well-developed.  HENT:     Head: Atraumatic.  Eyes:     Conjunctiva/sclera: Conjunctivae normal.  Neck:     Musculoskeletal: Neck supple.  Cardiovascular:     Rate and Rhythm: Normal rate and regular rhythm.  Pulmonary:     Effort: Pulmonary effort is normal.     Breath sounds: Normal breath sounds.  Abdominal:     General: Abdomen is flat.     Palpations: Abdomen is soft.     Tenderness: There is abdominal tenderness in the right lower quadrant, suprapubic area and left lower quadrant.     Hernia: No hernia is present.  Skin:    Findings: No rash.  Neurological:     Mental Status: She is alert.      ED Treatments / Results  Labs (all labs ordered are listed, but only abnormal results are displayed) Labs Reviewed  CBC WITH DIFFERENTIAL/PLATELET - Abnormal; Notable for the following components:      Result Value   Neutro Abs 8.1 (*)    All other components within normal limits  COMPREHENSIVE METABOLIC PANEL - Abnormal; Notable for the following components:   Glucose, Bld 102 (*)    Creatinine, Ser 1.02 (*)    All other components within normal limits  URINALYSIS, ROUTINE W REFLEX MICROSCOPIC - Abnormal; Notable for the following components:   APPearance  HAZY (*)    Hgb urine dipstick MODERATE (*)    Bacteria, UA RARE (*)    All other components within normal limits  SARS CORONAVIRUS 2 (HOSPITAL ORDER, [place] [facility])  LIPASE, BLOOD    EKG None  Radiology Ct Abdomen Pelvis  W Contrast  Result Date: 07/20/2018 CLINICAL DATA:  Abdominal pain, vomiting EXAM: CT ABDOMEN AND PELVIS WITH CONTRAST TECHNIQUE: Multidetector CT imaging of the abdomen and pelvis was performed using the standard protocol following bolus administration of intravenous contrast. CONTRAST:  171mL OMNIPAQUE IOHEXOL 300 MG/ML  SOLN COMPARISON:  07/06/2016 FINDINGS: Lower chest: No acute abnormality. Hepatobiliary: No focal liver abnormality is seen. Status post cholecystectomy. No biliary dilatation. Pancreas: Unremarkable. No pancreatic ductal dilatation or surrounding inflammatory changes. Spleen: Normal in size without significant abnormality. Adrenals/Urinary Tract: Adrenal glands are unremarkable. Kidneys are normal, without renal calculi, solid lesion, or hydronephrosis. Bladder is unremarkable. Stomach/Bowel: Stomach is within normal limits. The appendix is enlarged and fluid filled up to 1.5 cm in caliber, positioned medial and posterior to the cecal base, with appendicoliths near the ostium. No evidence of bowel wall thickening, distention, or inflammatory changes. Vascular/Lymphatic: Scattered atherosclerosis. No enlarged abdominal or pelvic lymph nodes. Reproductive: No mass or other significant abnormality. Other: No abdominal wall hernia or abnormality. No abdominopelvic ascites. Musculoskeletal: No acute or significant osseous findings. IMPRESSION: 1. The appendix is enlarged and fluid filled up to 1.5 cm in caliber, positioned medial and posterior to the cecal base, with appendicoliths near the ostium. Findings are consistent with acute appendicitis. No evidence of perforation or abscess. 2. Other chronic, incidental, and postoperative findings as  detailed above. Electronically Signed   By: Eddie Candle M.D.   On: 07/20/2018 16:04    Procedures Procedures (including critical care time)  Medications Ordered in ED Medications  cefTRIAXone (ROCEPHIN) 2 g in sodium chloride 0.9 % 100 mL IVPB (has no administration in time range)    And  metroNIDAZOLE (FLAGYL) IVPB 500 mg (has no administration in time range)  sodium chloride 0.9 % bolus 1,000 mL (1,000 mLs Intravenous New Bag/Given 07/20/18 1438)  ondansetron (ZOFRAN) injection 4 mg (4 mg Intravenous Given 07/20/18 1437)  morphine 4 MG/ML injection 4 mg (4 mg Intravenous Given 07/20/18 1438)  iohexol (OMNIPAQUE) 300 MG/ML solution 100 mL (100 mLs Intravenous Contrast Given 07/20/18 1535)     Initial Impression / Assessment and Plan / ED Course  I have reviewed the triage vital signs and the nursing notes.  Pertinent labs & imaging results that were available during my care of the patient were reviewed by me and considered in my medical decision making (see chart for details).        BP (!) 162/93    Pulse 78    Temp 98.7 F (37.1 C) (Oral)    Resp 16    Ht 6' (1.829 m)    Wt 68.9 kg    LMP 03/27/2011    SpO2 99%    BMI 20.61 kg/m    Final Clinical Impressions(s) / ED Diagnoses   Final diagnoses:  Acute appendicitis, unspecified acute appendicitis type    ED Discharge Orders    None     2:28 PM Patient here with abdominal pain along with sensation of constipation, nausea and vomiting.  She does have moderate tenderness to her suprapubic region.  Work-up initiated.  We will obtain abdominal, CT scan.  4:10 PM CT scan demonstrates evidence of enlarged and fluid-filled appendix consistent with acute appendicitis.  No evidence of perforation or abscess.  Evidence of moderate hemoglobin on urine but no signs of infection.  Normal WBC.  At this time, will consult general surgery for further management of her condition.  Will obtain COVID-19 testing as well.  4:47 PM Appreciate  consultation  from General Surgeon Dr. Kae Heller who agrees to see and admit pt for further management of acute appendicitis.  Care discussed with Dr. Francia Greaves. Will initiate abx and IVF in ER.   [name] was evaluated in Emergency Department on 07/20/2018 for the symptoms described in the history of present illness. She was evaluated in the context of the global COVID-19 pandemic, which necessitated consideration that the patient might be at risk for infection with the SARS-CoV-2 virus that causes COVID-19. Institutional protocols and algorithms that pertain to the evaluation of patients at risk for COVID-19 are in a state of rapid change based on information released by regulatory bodies including the CDC and federal and state organizations. These policies and algorithms were followed during the patient's care in the ED.    Domenic Moras, PA-C 07/20/18 1648    Valarie Merino, MD 07/22/18 [phone]

## 2018-07-20 NOTE — ED Triage Notes (Signed)
Abdominal pain that started yesterday, vomit x 1

## 2018-07-20 NOTE — Anesthesia Preprocedure Evaluation (Signed)
Anesthesia Evaluation  Patient identified by MRN, date of birth, ID band Patient awake    Reviewed: Allergy & Precautions, NPO status , Patient's Chart, lab work & pertinent test results  Airway Mallampati: I  TM Distance: >3 FB Neck ROM: Full    Dental   Pulmonary Current Smoker,    Pulmonary exam normal        Cardiovascular hypertension, Pt. on medications Normal cardiovascular exam     Neuro/Psych Depression    GI/Hepatic GERD  Medicated and Controlled,(+) Hepatitis -, C  Endo/Other    Renal/GU      Musculoskeletal   Abdominal   Peds  Hematology  (+) HIV,   Anesthesia Other Findings   Reproductive/Obstetrics                             Anesthesia Physical Anesthesia Plan  ASA: III  Anesthesia Plan: General   Post-op Pain Management:    Induction: Intravenous  PONV Risk Score and Plan: 2  Airway Management Planned: Oral ETT  Additional Equipment:   Intra-op Plan:   Post-operative Plan: Extubation in OR  Informed Consent: I have reviewed the patients History and Physical, chart, labs and discussed the procedure including the risks, benefits and alternatives for the proposed anesthesia with the patient or authorized representative who has indicated his/her understanding and acceptance.       Plan Discussed with: CRNA and Surgeon  Anesthesia Plan Comments:         Anesthesia Quick Evaluation

## 2018-07-20 NOTE — Anesthesia Procedure Notes (Signed)
Procedure Name: Intubation Date/Time: 07/20/2018 10:01 PM Performed by: Clovis Cao, CRNA Pre-anesthesia Checklist: Patient identified, Emergency Drugs available, Suction available, Patient being monitored and Timeout performed Patient Re-evaluated:Patient Re-evaluated prior to induction Oxygen Delivery Method: Circle system utilized Preoxygenation: Pre-oxygenation with 100% oxygen Induction Type: IV induction, Rapid sequence and Cricoid Pressure applied Laryngoscope Size: Miller and 2 Grade View: Grade I Tube type: Oral Tube size: 7.0 mm Number of attempts: 1 Airway Equipment and Method: Stylet Placement Confirmation: ETT inserted through vocal cords under direct vision,  positive ETCO2 and breath sounds checked- equal and bilateral Secured at: 21 cm Tube secured with: Tape Dental Injury: Teeth and Oropharynx as per pre-operative assessment

## 2018-07-20 NOTE — Transfer of Care (Signed)
Immediate Anesthesia Transfer of Care Note  Patient: [name]  Procedure(s) Performed: APPENDECTOMY LAPAROSCOPIC (N/A Abdomen)  Patient Location: PACU  Anesthesia Type:General  Level of Consciousness: awake  Airway & Oxygen Therapy: Patient Spontanous Breathing and Patient connected to face mask oxygen  Post-op Assessment: Report given to RN and Post -op Vital signs reviewed and stable  Post vital signs: Reviewed and stable  Last Vitals:  Vitals Value Taken Time  BP 147/83 07/20/2018 11:20 PM  Temp    Pulse 77 07/20/2018 11:23 PM  Resp 16 07/20/2018 11:23 PM  SpO2 98 % 07/20/2018 11:23 PM  Vitals shown include unvalidated device data.  Last Pain:  Vitals:   07/20/18 1531  TempSrc:   PainSc: 5          Complications: No apparent anesthesia complications

## 2018-07-21 ENCOUNTER — Encounter (HOSPITAL_COMMUNITY): Payer: Self-pay | Admitting: Surgery

## 2018-07-21 LAB — BASIC METABOLIC PANEL
Anion gap: 11 (ref 5–15)
BUN: 8 mg/dL (ref 6–20)
CO2: 24 mmol/L (ref 22–32)
Calcium: 8.8 mg/dL — ABNORMAL LOW (ref 8.9–10.3)
Chloride: 102 mmol/L (ref 98–111)
Creatinine, Ser: 1.02 mg/dL — ABNORMAL HIGH (ref 0.44–1.00)
GFR calc Af Amer: >60 mL/min (ref >60)
GFR calc non Af Amer: >60 mL/min (ref >60)
Glucose, Bld: 144 mg/dL — ABNORMAL HIGH (ref 70–99)
Potassium: 3.5 mmol/L (ref 3.5–5.1)
Sodium: 137 mmol/L (ref 135–145)

## 2018-07-21 LAB — CBC
HCT: 37.2 % (ref 36.0–46.0)
Hemoglobin: 12.1 g/dL (ref 12.0–15.0)
MCH: 29.2 pg (ref 26.0–34.0)
MCHC: 32.5 g/dL (ref 30.0–36.0)
MCV: 89.6 fL (ref 80.0–100.0)
Platelets: 183 10*3/uL (ref 150–400)
RBC: 4.15 MIL/uL (ref 3.87–5.11)
RDW: 12.2 % (ref 11.5–15.5)
WBC: 11.2 10*3/uL — ABNORMAL HIGH (ref 4.0–10.5)
nRBC: 0 % (ref 0.0–0.2)

## 2018-07-21 MED ORDER — TRAMADOL HCL 50 MG PO TABS: 50.0000 mg | ORAL_TABLET | Freq: Four times a day (QID) | ORAL | 0 refills | Status: DC | PRN

## 2018-07-21 MED ORDER — IBUPROFEN 600 MG PO TABS: 600.0000 mg | ORAL_TABLET | Freq: Four times a day (QID) | ORAL | 0 refills | Status: DC | PRN

## 2018-07-21 MED ORDER — ALUM & MAG HYDROXIDE-SIMETH 200-200-20 MG/5ML PO SUSP: 15.0000 mL | Freq: Once | ORAL | Status: DC

## 2018-07-21 MED ORDER — BOOST / RESOURCE BREEZE PO LIQD CUSTOM
1.0000 | Freq: Three times a day (TID) | ORAL | Status: DC
  Administered 2018-07-21: 1 via ORAL

## 2018-07-21 NOTE — Discharge Summary (Signed)
[facility] Discharge Summary   Patient ID: [name] MRN: [id number] DOB/AGE: 58-Apr-1962 58 y.o.  Admit date: 07/20/2018 Discharge date: 07/21/2018  Admitting Diagnosis: Acute appendicitis  Discharge Diagnosis Patient Active Problem List   Diagnosis Date Noted  . Acute appendicitis 07/20/2018  . Callus of foot 12/12/2016  . Multiple renal cysts 08/14/2016  . Constipation 11/30/2014  . Essential hypertension 11/30/2014  . Hot flashes, menopausal 01/27/2014  . Asthma 01/23/2011  . HEADACHE 04/14/2008  . CHOLELITHIASIS 12/15/2007  . PERIPHERAL NEUROPATHY 11/07/2007  . Avulsion fracture of tooth 07/23/2007  . Depression 07/24/2006  . Human immunodeficiency virus (HIV) disease ([place]) 12/14/2005  . 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 TOBACCO ABUSE 12/14/2005  . SUBSTANCE ABUSE 12/14/2005  . GERD 12/14/2005  . PNEUMONIA, HX OF 12/14/2005  . COLPOSCOPY, HX OF 12/14/2005    Consultants None  Imaging: Ct Abdomen Pelvis W Contrast  Result Date: 07/20/2018 CLINICAL DATA:  Abdominal pain, vomiting EXAM: CT ABDOMEN AND PELVIS WITH CONTRAST TECHNIQUE: Multidetector CT imaging of the abdomen and pelvis was performed using the standard protocol following bolus administration of intravenous contrast. CONTRAST:  167mL OMNIPAQUE IOHEXOL 300 MG/ML  SOLN COMPARISON:  07/06/2016 FINDINGS: Lower chest: No acute abnormality. Hepatobiliary: No focal liver abnormality is seen. Status post cholecystectomy. No biliary dilatation. Pancreas: Unremarkable. No pancreatic ductal dilatation or surrounding inflammatory changes. Spleen: Normal in size without significant abnormality. Adrenals/Urinary Tract: Adrenal glands are unremarkable. Kidneys are normal, without renal calculi, solid lesion, or hydronephrosis. Bladder is unremarkable. Stomach/Bowel: Stomach is within normal limits. The appendix is enlarged and fluid filled up to 1.5 cm in caliber, positioned medial and posterior to the  cecal base, with appendicoliths near the ostium. No evidence of bowel wall thickening, distention, or inflammatory changes. Vascular/Lymphatic: Scattered atherosclerosis. No enlarged abdominal or pelvic lymph nodes. Reproductive: No mass or other significant abnormality. Other: No abdominal wall hernia or abnormality. No abdominopelvic ascites. Musculoskeletal: No acute or significant osseous findings. IMPRESSION: 1. The appendix is enlarged and fluid filled up to 1.5 cm in caliber, positioned medial and posterior to the cecal base, with appendicoliths near the ostium. Findings are consistent with acute appendicitis. No evidence of perforation or abscess. 2. Other chronic, incidental, and postoperative findings as detailed above. Electronically Signed   By: Eddie Candle M.D.   On: 07/20/2018 16:04    Procedures Dr. Kae Heller (07/20/18) - Laparoscopic Appendectomy  Hospital Course:  ORPAH HAUSNER is a 58yo female who presented to [facility] 6/7 with <24 hours of abdominal pain.  Workup showed acute appendicitis.  Patient was admitted and underwent procedure listed above.  Tolerated procedure well and was transferred to the floor.  Diet was advanced as tolerated.  On POD1, the patient was voiding well, tolerating diet, ambulating well, pain well controlled, vital signs stable, incisions c/d/i and felt stable for discharge home.  Patient will follow up as below and knows to call with questions or concerns.     Physical Exam: General:  Alert, NAD, pleasant, comfortable Pulm: effort normal, CTAB Cardio: RRR Abd:  Soft, ND, +BS, appropriately tender, multiple lap incisions C/D/I  Allergies as of 07/21/2018      Reactions   Aspirin Other (See Comments)   heart murmur      Medication List    STOP taking these medications   PARoxetine 20 MG tablet Commonly known as:  PAXIL     TAKE these medications   acetaminophen 500 MG tablet Commonly known as:  TYLENOL Take 1 tablet (500  mg total) by mouth every  6 (six) hours as needed. What changed:    how much to take  reasons to take this   amLODipine 5 MG tablet Commonly known as:  NORVASC Take 1 tablet (5 mg total) by mouth daily.   bictegravir-emtricitabine-tenofovir AF 50-200-25 MG Tabs tablet Commonly known as:  Biktarvy Take 1 tablet by mouth daily.   cyclobenzaprine 10 MG tablet Commonly known as:  FLEXERIL Take 1 tablet (10 mg total) by mouth 3 (three) times daily as needed for muscle spasms.   Ensure Take 1 Can by mouth 3 (three) times daily between meals.   fluticasone 50 MCG/ACT nasal spray Commonly known as:  FLONASE Place 2 sprays into both nostrils daily.   ibuprofen 600 MG tablet Commonly known as:  ADVIL Take 1 tablet (600 mg total) by mouth every 6 (six) hours as needed for mild pain.   traMADol 50 MG tablet Commonly known as:  ULTRAM Take 1 tablet (50 mg total) by mouth every 6 (six) hours as needed for severe pain.        Follow-up [facility], [place]. Go on 08/07/2018.   Specialty:  General Surgery Why:  Your appointment is 6/25 at 1:30pm. Please arrive 30 minutes prior to your appointment to check in and fill out paperwork. Bring photo ID and any insurance information. Contact information: [address] [place] [phone]          Signed: Wellington Hampshire, [facility] 07/21/2018, 12:31 PM Pager: [phone] Mon-Thurs 7:00 am-4:30 pm Fri 7:00 am -11:30 AM Sat-Sun 7:00 am-11:30 am

## 2018-07-21 NOTE — Plan of Care (Signed)
  Problem: Clinical Measurements: Goal: Postoperative complications will be avoided or minimized Outcome: Progressing   Problem: Skin Integrity: Goal: Demonstration of wound healing without infection will improve Outcome: Progressing   

## 2018-07-21 NOTE — Discharge Instructions (Signed)
[facility]   LAPAROSCOPIC SURGERY: POST OP INSTRUCTIONS Always review your discharge instruction sheet given to you by the facility where your surgery was performed. IF YOU HAVE DISABILITY OR FAMILY LEAVE FORMS, YOU MUST BRING THEM TO THE OFFICE FOR PROCESSING.   DO NOT GIVE THEM TO YOUR DOCTOR.  PAIN CONTROL  1. First take acetaminophen (Tylenol) AND/or ibuprofen (Advil) to control your pain after surgery.  Follow directions on package.  Taking acetaminophen (Tylenol) and/or ibuprofen (Advil) regularly after surgery will help to control your pain and lower the amount of prescription pain medication you may need.  You should not take more than 4,000 mg (4 grams) of acetaminophen (Tylenol) in 24 hours.  You should not take ibuprofen (Advil), aleve, motrin, naprosyn or other NSAIDS if you have a history of stomach ulcers or chronic kidney disease.  2. A prescription for pain medication may be given to you upon discharge.  Take your pain medication as prescribed, if you still have uncontrolled pain after taking acetaminophen (Tylenol) or ibuprofen (Advil). 3. Use ice packs to help control pain. 4. If you need a refill on your pain medication, please contact your pharmacy.  They will contact our office to request authorization. Prescriptions will not be filled after 5pm or on week-ends.  HOME MEDICATIONS 5. Take your usually prescribed medications unless otherwise directed.  DIET 6. You should follow a light diet the first few days after arrival home.  Be sure to include lots of fluids daily. Avoid fatty, fried foods.   CONSTIPATION 7. It is common to experience some constipation after surgery and if you are taking pain medication.  Increasing fluid intake and taking a stool softener (such as Colace) will usually help or prevent this problem from occurring.  A mild laxative (Milk of Magnesia or Miralax) should be taken according to package instructions if there are no bowel  movements after 48 hours.  WOUND/INCISION CARE 8. Most patients will experience some swelling and bruising in the area of the incisions.  Ice packs will help.  Swelling and bruising can take several days to resolve.  9. Unless discharge instructions indicate otherwise, follow guidelines below  a. STERI-STRIPS - you may remove your outer bandages 48 hours after surgery, and you may shower at that time.  You have steri-strips (small skin tapes) in place directly over the incision.  These strips should be left on the skin for 7-10 days.   b. DERMABOND/SKIN GLUE - you may shower in 24 hours.  The glue will flake off over the next 2-3 weeks. 10. Any sutures or staples will be removed at the office during your follow-up visit.  ACTIVITIES 11. You may resume regular (light) daily activities beginning the next day--such as daily self-care, walking, climbing stairs--gradually increasing activities as tolerated.  You may have sexual intercourse when it is comfortable.  Refrain from any heavy lifting or straining until approved by your doctor. a. You may drive when you are no longer taking prescription pain medication, you can comfortably wear a seatbelt, and you can safely maneuver your car and apply brakes.  FOLLOW-UP 12. You should see your doctor in the office for a follow-up appointment approximately 2-3 weeks after your surgery.  You should have been given your post-op/follow-up appointment when your surgery was scheduled.  If you did not receive a post-op/follow-up appointment, make sure that you call for this appointment within a day or two after you arrive home to insure a convenient appointment time.  OTHER INSTRUCTIONS  WHEN TO CALL YOUR DOCTOR: 1. Fever over 101.0 2. Inability to urinate 3. Continued bleeding from incision. 4. Increased pain, redness, or drainage from the incision. 5. Increasing abdominal pain  The clinic staff is available to answer your questions during regular business  hours.  Please dont hesitate to call and ask to speak to one of the nurses for clinical concerns.  If you have a medical emergency, go to the nearest emergency room or call 911.  A surgeon from [facility] is always on call at the hospital. [address] [place], [place], [place]  [address] ? [address], [place], [place]   [address] [phone] ? [phone] ? FAX [phone]

## 2018-08-06 ENCOUNTER — Telehealth: Payer: Self-pay

## 2018-08-06 NOTE — Telephone Encounter (Signed)
Called and spoke with patient for COVID 19 Screening. Patient had no risk factors and is cleared to come into office for appointment. Thanks! 

## 2018-08-07 ENCOUNTER — Other Ambulatory Visit: Payer: Self-pay

## 2018-08-07 ENCOUNTER — Ambulatory Visit (INDEPENDENT_AMBULATORY_CARE_PROVIDER_SITE_OTHER): Payer: Self-pay | Admitting: Family Medicine

## 2018-08-07 ENCOUNTER — Encounter: Payer: Self-pay | Admitting: Family Medicine

## 2018-08-07 VITALS — BP 121/80 | HR 76 | Temp 97.8°F | Resp 14 | Ht 72.0 in | Wt 153.0 lb

## 2018-08-07 DIAGNOSIS — I10 Essential (primary) hypertension: Secondary | ICD-10-CM

## 2018-08-07 DIAGNOSIS — R8290 Unspecified abnormal findings in urine: Secondary | ICD-10-CM

## 2018-08-07 LAB — POCT URINALYSIS DIPSTICK
Bilirubin, UA: NEGATIVE
Glucose, UA: NEGATIVE
Ketones, UA: NEGATIVE
Nitrite, UA: NEGATIVE
Protein, UA: NEGATIVE
Spec Grav, UA: 1.02 (ref 1.010–1.025)
Urobilinogen, UA: 0.2 E.U./dL
pH, UA: 5 (ref 5.0–8.0)

## 2018-08-07 NOTE — Patient Instructions (Signed)

## 2018-08-07 NOTE — Progress Notes (Signed)
  Patient [facility]   Progress Note: General Provider: Lanae Boast, FNP  SUBJECTIVE:   Tabitha [name] [name] is a 58 y.o. female who  has a past medical history of Asthma, Hepatitis C, and HIV (human immunodeficiency virus infection) ([place]).. Patient presents today for Hypertension Since her last visit, patient had an appendectomy. She reports doing well s/p surgery. She reports compliance with medications and denies side effects at the present time. Her BP is in normal range today. Patient is a current smoker who repors 1/2 ppd. She is not currently interested in quitting.  Being followed by NP Dixon in [place] for HIV.   Review of Systems  Constitutional: Negative.   HENT: Negative.   Eyes: Negative.   Respiratory: Negative.   Cardiovascular: Negative.   Gastrointestinal: Negative.   Genitourinary: Negative.   Musculoskeletal: Negative.   Skin: Negative.   Neurological: Negative.   Psychiatric/Behavioral: Negative.      OBJECTIVE: BP 121/80 (BP Location: Left Arm, Patient Position: Sitting, Cuff Size: Normal)   Pulse 76   Temp 97.8 F (36.6 C) (Oral)   Resp 14   Ht 6' (1.829 m)   Wt 153 lb (69.4 kg)   LMP 03/27/2011   SpO2 100%   BMI 20.75 kg/m   Wt Readings from Last 3 Encounters:  08/07/18 153 lb (69.4 kg)  07/20/18 152 lb (68.9 kg)  06/03/18 155 lb (70.3 kg)     Physical Exam Vitals signs and nursing note reviewed.  Constitutional:      General: She is not in acute distress.    Appearance: Normal appearance.  HENT:     Head: Normocephalic and atraumatic.  Eyes:     Extraocular Movements: Extraocular movements intact.     Conjunctiva/sclera: Conjunctivae normal.     Pupils: Pupils are equal, round, and reactive to light.  Cardiovascular:     Rate and Rhythm: Normal rate and regular rhythm.     Heart sounds: No murmur.  Pulmonary:     Effort: Pulmonary effort is normal.     Breath sounds: Normal breath sounds.   Musculoskeletal: Normal range of motion.  Skin:    General: Skin is warm and dry.  Neurological:     Mental Status: She is alert and oriented to person, place, and time.  Psychiatric:        Mood and Affect: Mood normal.        Behavior: Behavior normal.        Thought Content: Thought content normal.        Judgment: Judgment normal.     ASSESSMENT/PLAN:  1. Essential hypertension No medication changes warranted at the present time.   - Urinalysis Dipstick  2. Abnormal urinalysis Urine culture pending. Will treat accordingly.  - Urine Culture   Return in about 3 months (around 11/07/2018) for htn.    The patient was given clear instructions to go to ER or return to medical center if symptoms do not improve, worsen or new problems develop. The patient verbalized understanding and agreed with plan of care.   Tabitha [name]. Tabitha Canary, FNP-BC [facility] [address] [place], [place] [address] [phone]

## 2018-08-10 ENCOUNTER — Telehealth: Payer: Self-pay | Admitting: Infectious Diseases

## 2018-08-10 LAB — URINE CULTURE

## 2018-08-10 NOTE — Telephone Encounter (Signed)
error 

## 2018-08-11 MED ORDER — NITROFURANTOIN MONOHYD MACRO 100 MG PO CAPS: 100.0000 mg | ORAL_CAPSULE | Freq: Two times a day (BID) | ORAL | 0 refills | Status: AC

## 2018-08-11 NOTE — Progress Notes (Signed)
The script will be for 7 days and not 5

## 2018-08-11 NOTE — Progress Notes (Signed)
Yes,  I am sending in a week of macrobid. Thanks for checking.

## 2018-08-12 ENCOUNTER — Telehealth: Payer: Self-pay

## 2018-08-12 NOTE — Telephone Encounter (Signed)
Called and spoke with patient, advised that she does have a UTI and that we have sent in [place] twice daily for 7 days. Patient verbalized understanding. Thanks!

## 2018-08-12 NOTE — Telephone Encounter (Signed)
-----   Message from Lanae Boast, [place] sent at 08/11/2018  5:24 PM EDT ----- The script will be for 7 days and not 5

## 2018-08-19 ENCOUNTER — Ambulatory Visit: Payer: Self-pay

## 2018-08-19 ENCOUNTER — Other Ambulatory Visit: Payer: Self-pay

## 2018-08-27 ENCOUNTER — Encounter: Payer: Self-pay | Admitting: Infectious Diseases

## 2018-10-15 ENCOUNTER — Telehealth: Payer: Self-pay | Admitting: *Deleted

## 2018-10-15 NOTE — Telephone Encounter (Signed)
Patient called to report that she stayed at some ones home overnight and found out they have bed bugs the next morning as she had bites on her legs. She reports they are red and itchy and benadryl cream is not working. She wants Dr Johnnye Sima to send in a Rx for something for the bites. Advised her he may need to see her to decide what medication to give her. Advised will send the provider a message and get back to her once he responds.

## 2018-10-16 ENCOUNTER — Other Ambulatory Visit: Payer: Self-pay | Admitting: Infectious Diseases

## 2018-10-16 DIAGNOSIS — W57XXXA Bitten or stung by nonvenomous insect and other nonvenomous arthropods, initial encounter: Secondary | ICD-10-CM

## 2018-10-16 MED ORDER — DIPHENHYDRAMINE HCL 50 MG PO TABS: 50.0000 mg | ORAL_TABLET | Freq: Three times a day (TID) | ORAL | 0 refills | Status: DC | PRN

## 2018-10-16 MED ORDER — TRIAMCINOLONE ACETONIDE 0.025 % EX OINT: 1.0000 "application " | TOPICAL_OINTMENT | Freq: Two times a day (BID) | CUTANEOUS | 0 refills | Status: DC

## 2018-10-16 NOTE — Telephone Encounter (Signed)
I'll write a rx for triamcinolone 1%.  She can take benadryl po She needs to assure that she did not bring the bugs home with her. All clothes need to be washed in hot water as well as all bedding. Any pillow or toys need to washed as well (or discarded) thanks

## 2018-10-16 NOTE — Telephone Encounter (Signed)
Per Dr Johnnye Sima response called the patient to advise of cream and to wash or dispose of what she can so she does not have them in her home. Had to leave a message for her to call the office as she did not answer the phone.

## 2018-10-16 NOTE — Progress Notes (Signed)
Please note different dose of triamcinolone (not 1%)

## 2018-10-22 ENCOUNTER — Encounter (HOSPITAL_COMMUNITY): Payer: Self-pay

## 2018-10-22 ENCOUNTER — Encounter (HOSPITAL_COMMUNITY): Payer: Self-pay | Admitting: *Deleted

## 2018-11-07 ENCOUNTER — Ambulatory Visit: Payer: Self-pay | Admitting: Family Medicine

## 2018-11-10 ENCOUNTER — Ambulatory Visit (INDEPENDENT_AMBULATORY_CARE_PROVIDER_SITE_OTHER): Payer: Self-pay

## 2018-11-10 ENCOUNTER — Other Ambulatory Visit: Payer: Self-pay

## 2018-11-10 DIAGNOSIS — Z23 Encounter for immunization: Secondary | ICD-10-CM

## 2018-11-13 DIAGNOSIS — F419 Anxiety disorder, unspecified: Secondary | ICD-10-CM

## 2018-11-13 HISTORY — DX: Anxiety disorder, unspecified: F41.9

## 2018-11-17 ENCOUNTER — Ambulatory Visit (INDEPENDENT_AMBULATORY_CARE_PROVIDER_SITE_OTHER): Payer: Self-pay | Admitting: Family Medicine

## 2018-11-17 ENCOUNTER — Other Ambulatory Visit: Payer: Self-pay

## 2018-11-17 ENCOUNTER — Encounter: Payer: Self-pay | Admitting: Family Medicine

## 2018-11-17 VITALS — BP 123/83 | HR 75 | Temp 98.6°F | Resp 14 | Ht 72.0 in | Wt 151.0 lb

## 2018-11-17 DIAGNOSIS — F418 Other specified anxiety disorders: Secondary | ICD-10-CM

## 2018-11-17 DIAGNOSIS — I10 Essential (primary) hypertension: Secondary | ICD-10-CM

## 2018-11-17 DIAGNOSIS — Z09 Encounter for follow-up examination after completed treatment for conditions other than malignant neoplasm: Secondary | ICD-10-CM

## 2018-11-17 DIAGNOSIS — Z21 Asymptomatic human immunodeficiency virus [HIV] infection status: Secondary | ICD-10-CM

## 2018-11-17 LAB — POCT URINALYSIS DIPSTICK
Bilirubin, UA: NEGATIVE
Glucose, UA: NEGATIVE
Ketones, UA: NEGATIVE
Leukocytes, UA: NEGATIVE
Nitrite, UA: NEGATIVE
Protein, UA: NEGATIVE
Spec Grav, UA: 1.015 (ref 1.010–1.025)
Urobilinogen, UA: 0.2 E.U./dL
pH, UA: 6.5 (ref 5.0–8.0)

## 2018-11-17 MED ORDER — BUSPIRONE HCL 5 MG PO TABS: 5.0000 mg | ORAL_TABLET | Freq: Two times a day (BID) | ORAL | 2 refills | Status: DC

## 2018-11-17 NOTE — Progress Notes (Signed)
Patient [facility]   Established Patient Office Visit  Subjective:  Patient ID: [name], female    DOB: [birth year]  Age: 58 y.o. MRN: [id number]  CC:  Chief Complaint  Patient presents with  . Hypertension    HPI [name] is a 58 year old female who presents for Follow Up today.   Past Medical History:  Diagnosis Date  . Anxiety 11/2018  . Asthma   . Hepatitis C   . HIV (human immunodeficiency virus infection) ([place])     Current Status: This will be Ms. Grams's initial office visit with me. She was previously seeing Lanae Boast, NP for her PCP needs. Since her last office visit, she is doing well with no complaints. She denies visual changes, chest pain, cough, shortness of breath, heart palpitations, and falls. She has occasional headaches and dizziness with position changes. Denies severe headaches, confusion, seizures, double vision, and blurred vision, nausea and vomiting. She continues to follow up with Infection Disease as needed. She denies fevers, chills, recent infections, weight loss, and night sweats. No reports of GI problems such as nausea, vomiting, diarrhea, and constipation. She has no reports of blood in stools, dysuria and hematuria. Her anxiety is moderated today, r/t family situation. She denies pain today.   Past Surgical History:  Procedure Laterality Date  . COLONOSCOPY WITH PROPOFOL N/A 06/26/2016   Procedure: COLONOSCOPY WITH PROPOFOL;  Surgeon: Otis Brace, MD;  Location: [facility];  Service: Gastroenterology;  Laterality: N/A;  . LAPAROSCOPIC APPENDECTOMY N/A 07/20/2018   Procedure: APPENDECTOMY LAPAROSCOPIC;  Surgeon: Clovis Riley, MD;  Location: [facility] OR;  Service: General;  Laterality: N/A;  . PANCREATECTOMY      Family History  Problem Relation Age of Onset  . Cancer Mother        bone  . Diabetes Sister   . Colon cancer Maternal Uncle   . Esophageal cancer Neg Hx    . Gastric cancer Neg Hx   . Pancreatic cancer Neg Hx   . Liver disease Neg Hx   . Inflammatory bowel disease Neg Hx     Social History   Socioeconomic History  . Marital status: Single    Spouse name: Not on file  . Number of children: Not on file  . Years of education: Not on file  . Highest education level: Not on file  Occupational History  . Not on file  Social Needs  . Financial resource strain: Not on file  . Food insecurity    Worry: Not on file    Inability: Not on file  . Transportation needs    Medical: Not on file    Non-medical: Not on file  Tobacco Use  . Smoking status: Current Every Day Smoker    Packs/day: 0.50    Years: 33.00    Pack years: 16.50    Types: Cigarettes  . Smokeless tobacco: Never Used  . Tobacco comment: cutting back  Substance and Sexual Activity  . Alcohol use: No    Alcohol/week: 0.0 standard drinks    Comment: recovered alcoholic for 10 years  . 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 Sexual activity: Never    Comment: pt. given condoms  Lifestyle  . Physical activity    Days per week: Not on file    Minutes per session: Not on file  . Stress: Not on file  Relationships  . Social connections  Talks on phone: Not on file    Gets together: Not on file    Attends religious service: Not on file    Active member of club or organization: Not on file    Attends meetings of clubs or organizations: Not on file    Relationship status: Not on file  . Intimate partner violence    Fear of current or ex partner: Not on file    Emotionally abused: Not on file    Physically abused: Not on file    Forced sexual activity: Not on file  Other Topics Concern  . Not on file  Social History Narrative  . Not on file    Outpatient Medications Prior to Visit  Medication Sig Dispense Refill  . acetaminophen (TYLENOL) 500 MG tablet Take 1 tablet (500 mg total) by mouth every 6 (six) hours as needed. (Patient taking differently: Take  1,000 mg by mouth every 6 (six) hours as needed for moderate pain (abdominal pain). ) 30 tablet 0  . amLODipine (NORVASC) 5 MG tablet Take 1 tablet (5 mg total) by mouth daily. 90 tablet 3  . bictegravir-emtricitabine-tenofovir AF (BIKTARVY) 50-200-25 MG TABS tablet Take 1 tablet by mouth daily. 30 tablet 11  . cyclobenzaprine (FLEXERIL) 10 MG tablet Take 1 tablet (10 mg total) by mouth 3 (three) times daily as needed for muscle spasms. 30 tablet 0  . diphenhydrAMINE (BENADRYL) 50 MG tablet Take 1 tablet (50 mg total) by mouth every 8 (eight) hours as needed for itching. 30 tablet 0  . Ensure (ENSURE) Take 1 Can by mouth 3 (three) times daily between meals. 240 mL 11  . fluticasone (FLONASE) 50 MCG/ACT nasal spray Place 2 sprays into both nostrils daily. 16 g 6  . ibuprofen (ADVIL) 600 MG tablet Take 1 tablet (600 mg total) by mouth every 6 (six) hours as needed for mild pain. 30 tablet 0  . traMADol (ULTRAM) 50 MG tablet Take 1 tablet (50 mg total) by mouth every 6 (six) hours as needed for severe pain. (Patient not taking: Reported on 11/17/2018) 15 tablet 0  . triamcinolone (KENALOG) 0.025 % ointment Apply 1 application topically 2 (two) times daily. (Patient not taking: Reported on 11/17/2018) 30 g 0   No facility-administered medications prior to visit.     Allergies  Allergen Reactions  . Aspirin Other (See Comments)    heart murmur    ROS Review of Systems  Constitutional: Negative.   HENT: Negative.   Eyes: Negative.   Respiratory: Negative.   Cardiovascular: Negative.   Gastrointestinal: Negative.   Endocrine: Negative.   Genitourinary: Negative.   Musculoskeletal: Negative.   Skin: Negative.   Allergic/Immunologic: Negative.   Neurological: Positive for dizziness (occasional ) and headaches (occaional ).  Hematological: Negative.   Psychiatric/Behavioral: Negative.     Objective:    Physical Exam  Constitutional: She is oriented to person, place, and time. She appears  well-developed and well-nourished.  HENT:  Head: Normocephalic and atraumatic.  Eyes: Conjunctivae are normal.  Neck: Normal range of motion. Neck supple.  Cardiovascular: Normal rate, regular rhythm, normal heart sounds and intact distal pulses.  Pulmonary/Chest: Effort normal and breath sounds normal.  Abdominal: Soft. Bowel sounds are normal.  Musculoskeletal: Normal range of motion.  Neurological: She is alert and oriented to person, place, and time. She has normal reflexes.  Skin: Skin is warm and dry.  Psychiatric: She has a normal mood and affect. Her behavior is normal. Judgment and thought content normal.  BP 123/83 (BP Location: Right Arm, Patient Position: Sitting, Cuff Size: Normal)   Pulse 75   Temp 98.6 F (37 C) (Oral)   Resp 14   Ht 6' (1.829 m)   Wt 151 lb (68.5 kg)   LMP 03/27/2011   SpO2 100%   BMI 20.48 kg/m  Wt Readings from Last 3 Encounters:  11/17/18 151 lb (68.5 kg)  08/07/18 153 lb (69.4 kg)  07/20/18 152 lb (68.9 kg)     Health Maintenance Due  Topic Date Due  . PAP SMEAR-Modifier  08/07/2018    There are no preventive care reminders to display for this patient.  Lab Results  Component Value Date   TSH 1.64 03/19/2016   Lab Results  Component Value Date   WBC 11.2 (H) 07/21/2018   HGB 12.1 07/21/2018   HCT 37.2 07/21/2018   MCV 89.6 07/21/2018   PLT 183 07/21/2018   Lab Results  Component Value Date   NA 137 07/21/2018   K 3.5 07/21/2018   CO2 24 07/21/2018   GLUCOSE 144 (H) 07/21/2018   BUN 8 07/21/2018   CREATININE 1.02 (H) 07/21/2018   BILITOT 0.8 07/20/2018   ALKPHOS 109 07/20/2018   AST 22 07/20/2018   ALT 14 07/20/2018   PROT 7.9 07/20/2018   ALBUMIN 3.9 07/20/2018   CALCIUM 8.8 (L) 07/21/2018   ANIONGAP 11 07/21/2018   Lab Results  Component Value Date   CHOL 152 03/24/2018   Lab Results  Component Value Date   HDL 51 03/24/2018   Lab Results  Component Value Date   LDLCALC 81 03/24/2018   Lab Results   Component Value Date   TRIG 102 03/24/2018   Lab Results  Component Value Date   CHOLHDL 3.0 03/24/2018   Lab Results  Component Value Date   HGBA1C 4.8 03/19/2016      Assessment & Plan:   1. Essential hypertension The current medical regimen is effective; blood pressure is stable at 123/83 today; continue present plan and medications as prescribed. She will continue to take medications as prescribed, to decrease high sodium intake, excessive alcohol intake, increase potassium intake, smoking cessation, and increase physical activity of at least 30 minutes of cardio activity daily. She will continue to follow Heart Healthy or DASH diet. - Urinalysis Dipstick  2. Asymptomatic HIV infection ([place]) Continue to follow up with Infection Disease as needed.   3. Situational anxiety - busPIRone (BUSPAR) 5 MG tablet; Take 1 tablet (5 mg total) by mouth 2 (two) times daily.  Dispense: 60 tablet; Refill: 2  4. Follow up She will follow up in 6 months.   Meds ordered this encounter  Medications  . busPIRone (BUSPAR) 5 MG tablet    Sig: Take 1 tablet (5 mg total) by mouth 2 (two) times daily.    Dispense:  60 tablet    Refill:  2    Orders Placed This Encounter  Procedures  . Urinalysis Dipstick    Referral Orders  No referral(s) requested today    Kathe Becton,  MSN, FNP-BC [facility] [address] [place], [place] [address] [phone] fax   Problem List Items Addressed This Visit      Cardiovascular and Mediastinum   Essential hypertension - Primary   Relevant Orders   Urinalysis Dipstick (Completed)    Other Visit Diagnoses    Asymptomatic HIV infection ([place])       Situational anxiety  Relevant Medications   busPIRone (BUSPAR) 5 MG tablet   Follow up          Meds ordered this encounter  Medications  . busPIRone (BUSPAR) 5 MG tablet    Sig: Take 1 tablet (5 mg  total) by mouth 2 (two) times daily.    Dispense:  60 tablet    Refill:  2    Follow-up: Return in about 6 months (around 05/18/2019).    Azzie Glatter, FNP

## 2018-11-17 NOTE — Patient Instructions (Signed)
Generalized Anxiety Disorder, Adult Generalized anxiety disorder (GAD) is a mental health disorder. People with this condition constantly worry about everyday events. Unlike normal anxiety, worry related to GAD is not triggered by a specific event. These worries also do not fade or get better with time. GAD interferes with life functions, including relationships, work, and school. GAD can vary from mild to severe. People with severe GAD can have intense waves of anxiety with physical symptoms (panic attacks). What are the causes? The exact cause of GAD is not known. What increases the risk? This condition is more likely to develop in:  Women.  People who have a family history of anxiety disorders.  People who are very shy.  People who experience very stressful life events, such as the death of a loved one.  People who have a very stressful family environment. What are the signs or symptoms? People with GAD often worry excessively about many things in their lives, such as their health and family. They may also be overly concerned about:  Doing well at work.  Being on time.  Natural disasters.  Friendships. Physical symptoms of GAD include:  Fatigue.  Muscle tension or having muscle twitches.  Trembling or feeling shaky.  Being easily startled.  Feeling like your heart is pounding or racing.  Feeling out of breath or like you cannot take a deep breath.  Having trouble falling asleep or staying asleep.  Sweating.  Nausea, diarrhea, or irritable bowel syndrome (IBS).  Headaches.  Trouble concentrating or remembering facts.  Restlessness.  Irritability. How is this diagnosed? Your health care provider can diagnose GAD based on your symptoms and medical history. You will also have a physical exam. The health care provider will ask specific questions about your symptoms, including how severe they are, when they started, and if they come and go. Your health care  provider may ask you about your use of alcohol or drugs, including prescription medicines. Your health care provider may refer you to a mental health specialist for further evaluation. Your health care provider will do a thorough examination and may perform additional tests to rule out other possible causes of your symptoms. To be diagnosed with GAD, a person must have anxiety that:  Is out of his or her control.  Affects several different aspects of his or her life, such as work and relationships.  Causes distress that makes him or her unable to take part in normal activities.  Includes at least three physical symptoms of GAD, such as restlessness, fatigue, trouble concentrating, irritability, muscle tension, or sleep problems. Before your health care provider can confirm a diagnosis of GAD, these symptoms must be present more days than they are not, and they must last for six months or longer. How is this treated? The following therapies are usually used to treat GAD:  Medicine. Antidepressant medicine is usually prescribed for long-term daily control. Antianxiety medicines may be added in severe cases, especially when panic attacks occur.  Talk therapy (psychotherapy). Certain types of talk therapy can be helpful in treating GAD by providing support, education, and guidance. Options include: ? Cognitive behavioral therapy (CBT). People learn coping skills and techniques to ease their anxiety. They learn to identify unrealistic or negative thoughts and behaviors and to replace them with positive ones. ? Acceptance and commitment therapy (ACT). This treatment teaches people how to be mindful as a way to cope with unwanted thoughts and feelings. ? Biofeedback. This process trains you to manage your body's response (  physiological response) through breathing techniques and relaxation methods. You will work with a therapist while machines are used to monitor your physical symptoms.  Stress  management techniques. These include yoga, meditation, and exercise. A mental health specialist can help determine which treatment is best for you. Some people see improvement with one type of therapy. However, other people require a combination of therapies. Follow these instructions at home:  Take over-the-counter and prescription medicines only as told by your health care provider.  Try to maintain a normal routine.  Try to anticipate stressful situations and allow extra time to manage them.  Practice any stress management or self-calming techniques as taught by your health care provider.  Do not punish yourself for setbacks or for not making progress.  Try to recognize your accomplishments, even if they are small.  Keep all follow-up visits as told by your health care provider. This is important. Contact a health care provider if:  Your symptoms do not get better.  Your symptoms get worse.  You have signs of depression, such as: ? A persistently sad, cranky, or irritable mood. ? Loss of enjoyment in activities that used to bring you joy. ? Change in weight or eating. ? Changes in sleeping habits. ? Avoiding friends or family members. ? Loss of energy for normal tasks. ? Feelings of guilt or worthlessness. Get help right away if:  You have serious thoughts about hurting yourself or others. If you ever feel like you may hurt yourself or others, or have thoughts about taking your own life, get help right away. You can go to your nearest emergency department or call:  Your local emergency services (911 in the [place]  A suicide crisis helpline, such as the [place] at [phone]. This is open 24 hours a day. Summary  Generalized anxiety disorder (GAD) is a mental health disorder that involves worry that is not triggered by a specific event.  People with GAD often worry excessively about many things in their lives, such as their health and  family.  GAD may cause physical symptoms such as restlessness, trouble concentrating, sleep problems, frequent sweating, nausea, diarrhea, headaches, and trembling or muscle twitching.  A mental health specialist can help determine which treatment is best for you. Some people see improvement with one type of therapy. However, other people require a combination of therapies. This information is not intended to replace advice given to you by your health care provider. Make sure you discuss any questions you have with your health care provider. Document Released: 05/26/2012 Document Revised: 01/11/2017 Document Reviewed: 12/20/2015 Elsevier Patient Education  [place]. Buspirone tablets What is this medicine? BUSPIRONE (byoo SPYE rone) is used to treat anxiety disorders. This medicine may be used for other purposes; ask your health care provider or pharmacist if you have questions. COMMON BRAND NAME(S): BuSpar What should I tell my health care provider before I take this medicine? They need to know if you have any of these conditions:  kidney or liver disease  an unusual or allergic reaction to buspirone, other medicines, foods, dyes, or preservatives  pregnant or trying to get pregnant  breast-feeding How should I use this medicine? Take this medicine by mouth with a glass of water. Follow the directions on the prescription label. You may take this medicine with or without food. To ensure that this medicine always works the same way for you, you should take it either always with or always without food. Take your doses  at regular intervals. Do not take your medicine more often than directed. Do not stop taking except on the advice of your doctor or health care professional. Talk to your pediatrician regarding the use of this medicine in children. Special care may be needed. Overdosage: If you think you have taken too much of this medicine contact a poison control center or emergency  room at once. NOTE: This medicine is only for you. Do not share this medicine with others. What if I miss a dose? If you miss a dose, take it as soon as you can. If it is almost time for your next dose, take only that dose. Do not take double or extra doses. What may interact with this medicine? Do not take this medicine with any of the following medications:  linezolid  MAOIs like Carbex, Eldepryl, Marplan, Nardil, and Parnate  methylene blue  procarbazine This medicine may also interact with the following medications:  diazepam  digoxin  diltiazem  erythromycin  grapefruit juice  haloperidol  medicines for mental depression or mood problems  medicines for seizures like carbamazepine, phenobarbital and phenytoin  nefazodone  other medications for anxiety  rifampin  ritonavir  some antifungal medicines like itraconazole, ketoconazole, and voriconazole  verapamil  warfarin This list may not describe all possible interactions. Give your health care provider a list of all the medicines, herbs, non-prescription drugs, or dietary supplements you use. Also tell them if you smoke, drink alcohol, or use illegal drugs. Some items may interact with your medicine. What should I watch for while using this medicine? Visit your doctor or health care professional for regular checks on your progress. It may take 1 to 2 weeks before your anxiety gets better. You may get drowsy or dizzy. Do not drive, use machinery, or do anything that needs mental alertness until you know how this drug affects you. Do not stand or sit up quickly, especially if you are an older patient. This reduces the risk of dizzy or fainting spells. Alcohol can make you more drowsy and dizzy. Avoid alcoholic drinks. What side effects may I notice from receiving this medicine? Side effects that you should report to your doctor or health care professional as soon as possible:  blurred vision or other vision  changes  chest pain  confusion  difficulty breathing  feelings of hostility or anger  muscle aches and pains  numbness or tingling in hands or feet  ringing in the ears  skin rash and itching  vomiting  weakness Side effects that usually do not require medical attention (report to your doctor or health care professional if they continue or are bothersome):  disturbed dreams, nightmares  headache  nausea  restlessness or nervousness  sore throat and nasal congestion  stomach upset This list may not describe all possible side effects. Call your doctor for medical advice about side effects. You may report side effects to FDA at 1-800-FDA-1088. Where should I keep my medicine? Keep out of the reach of children. Store at room temperature below 30 degrees C (86 degrees F). Protect from light. Keep container tightly closed. Throw away any unused medicine after the expiration date. NOTE: This sheet is a summary. It may not cover all possible information. If you have questions about this medicine, talk to your doctor, pharmacist, or health care provider.  2020 Elsevier/Gold Standard (2009-09-08 18:06:11)

## 2018-12-12 ENCOUNTER — Other Ambulatory Visit: Payer: Self-pay | Admitting: Pharmacist

## 2018-12-12 DIAGNOSIS — B20 Human immunodeficiency virus [HIV] disease: Secondary | ICD-10-CM

## 2018-12-12 MED ORDER — BIKTARVY 50-200-25 MG PO TABS: 1.0000 | ORAL_TABLET | Freq: Every day | ORAL | 11 refills | Status: DC

## 2018-12-12 NOTE — Progress Notes (Signed)
refills  

## 2018-12-15 ENCOUNTER — Other Ambulatory Visit: Payer: Self-pay

## 2018-12-16 ENCOUNTER — Other Ambulatory Visit: Payer: Self-pay

## 2018-12-17 ENCOUNTER — Other Ambulatory Visit: Payer: Self-pay

## 2018-12-17 DIAGNOSIS — B20 Human immunodeficiency virus [HIV] disease: Secondary | ICD-10-CM

## 2018-12-17 DIAGNOSIS — Z113 Encounter for screening for infections with a predominantly sexual mode of transmission: Secondary | ICD-10-CM

## 2018-12-17 DIAGNOSIS — Z79899 Other long term (current) drug therapy: Secondary | ICD-10-CM

## 2018-12-17 MED ORDER — BIKTARVY 50-200-25 MG PO TABS: 1.0000 | ORAL_TABLET | Freq: Every day | ORAL | 5 refills | Status: DC

## 2018-12-17 NOTE — Addendum Note (Signed)
Addended by: Magda Kiel L on: 12/17/2018 11:22 AM   Modules accepted: Orders

## 2018-12-18 ENCOUNTER — Other Ambulatory Visit: Payer: Self-pay | Admitting: Infectious Diseases

## 2018-12-18 DIAGNOSIS — Z113 Encounter for screening for infections with a predominantly sexual mode of transmission: Secondary | ICD-10-CM

## 2018-12-18 LAB — T-HELPER CELL (CD4) - (RCID CLINIC ONLY)
CD4 % Helper T Cell: 29 % — ABNORMAL LOW (ref 33–65)
CD4 T Cell Abs: 479 /uL (ref 400–1790)

## 2018-12-18 NOTE — Addendum Note (Signed)
Addended byMeriel Pica F on: 12/18/2018 09:14 AM   Modules accepted: Orders

## 2018-12-19 LAB — URINE CYTOLOGY ANCILLARY ONLY
Chlamydia: NEGATIVE
Comment: NEGATIVE
Comment: NORMAL
Neisseria Gonorrhea: NEGATIVE

## 2018-12-23 LAB — COMPREHENSIVE METABOLIC PANEL
AG Ratio: 1.4 (calc) (ref 1.0–2.5)
ALT: 12 U/L (ref 6–29)
AST: 21 U/L (ref 10–35)
Albumin: 4.3 g/dL (ref 3.6–5.1)
Alkaline phosphatase (APISO): 113 U/L (ref 37–153)
BUN/Creatinine Ratio: 10 (calc) (ref 6–22)
BUN: 13 mg/dL (ref 7–25)
CO2: 26 mmol/L (ref 20–32)
Calcium: 9.4 mg/dL (ref 8.6–10.4)
Chloride: 109 mmol/L (ref 98–110)
Creat: 1.24 mg/dL — ABNORMAL HIGH (ref 0.50–1.05)
Globulin: 3.1 g/dL (calc) (ref 1.9–3.7)
Glucose, Bld: 112 mg/dL — ABNORMAL HIGH (ref 65–99)
Potassium: 4.8 mmol/L (ref 3.5–5.3)
Sodium: 140 mmol/L (ref 135–146)
Total Bilirubin: 0.4 mg/dL (ref 0.2–1.2)
Total Protein: 7.4 g/dL (ref 6.1–8.1)

## 2018-12-23 LAB — CBC
HCT: 38 % (ref 35.0–45.0)
Hemoglobin: 12.2 g/dL (ref 11.7–15.5)
MCH: 29.5 pg (ref 27.0–33.0)
MCHC: 32.1 g/dL (ref 32.0–36.0)
MCV: 92 fL (ref 80.0–100.0)
MPV: 11.1 fL (ref 7.5–12.5)
Platelets: 210 10*3/uL (ref 140–400)
RBC: 4.13 10*6/uL (ref 3.80–5.10)
RDW: 11.9 % (ref 11.0–15.0)
WBC: 3.9 10*3/uL (ref 3.8–10.8)

## 2018-12-23 LAB — LIPID PANEL
Cholesterol: 138 mg/dL (ref <200)
HDL: 57 mg/dL (ref >=50)
LDL Cholesterol (Calc): 64 mg/dL (calc)
Non-HDL Cholesterol (Calc): 81 mg/dL (calc) (ref <130)
Total CHOL/HDL Ratio: 2.4 (calc) (ref <5.0)
Triglycerides: 89 mg/dL (ref <150)

## 2018-12-23 LAB — RPR: RPR Ser Ql: REACTIVE — AB

## 2018-12-23 LAB — RPR TITER: RPR Titer: 1:1 {titer} — ABNORMAL HIGH

## 2018-12-23 LAB — FLUORESCENT TREPONEMAL AB(FTA)-IGG-BLD: Fluorescent Treponemal ABS: NONREACTIVE

## 2018-12-23 LAB — HIV-1 RNA QUANT-NO REFLEX-BLD
HIV 1 RNA Quant: <20 copies/mL
HIV-1 RNA Quant, Log: <1.3 Log copies/mL

## 2018-12-30 ENCOUNTER — Encounter: Payer: Self-pay | Admitting: Infectious Diseases

## 2019-01-01 ENCOUNTER — Ambulatory Visit: Payer: Self-pay | Admitting: Infectious Diseases

## 2019-01-15 ENCOUNTER — Ambulatory Visit: Payer: Self-pay | Admitting: Infectious Diseases

## 2019-01-20 ENCOUNTER — Telehealth: Payer: Self-pay | Admitting: *Deleted

## 2019-01-20 NOTE — Telephone Encounter (Signed)
Received call from Patience at [facility] requesting refill of ensure prescription.  She is scheduled to follow up with Dr Johnnye Sima 12/10.  Will route to him so this can be assessed at the appointment. If appropriate to refill, she will need a diagnosis of weight loss or malnutrition on her problem list so [facility] is able to provide this prescription. Paper prescription to be faxed to [phone]. Landis Gandy, RN

## 2019-01-22 ENCOUNTER — Encounter: Payer: Self-pay | Admitting: Infectious Diseases

## 2019-01-22 ENCOUNTER — Other Ambulatory Visit: Payer: Self-pay

## 2019-01-22 ENCOUNTER — Ambulatory Visit (INDEPENDENT_AMBULATORY_CARE_PROVIDER_SITE_OTHER): Payer: Self-pay | Admitting: Infectious Diseases

## 2019-01-22 VITALS — BP 115/80 | HR 81 | Temp 98.4°F | Wt 148.0 lb

## 2019-01-22 DIAGNOSIS — I10 Essential (primary) hypertension: Secondary | ICD-10-CM

## 2019-01-22 DIAGNOSIS — Z113 Encounter for screening for infections with a predominantly sexual mode of transmission: Secondary | ICD-10-CM

## 2019-01-22 DIAGNOSIS — Z79899 Other long term (current) drug therapy: Secondary | ICD-10-CM

## 2019-01-22 DIAGNOSIS — K5909 Other constipation: Secondary | ICD-10-CM

## 2019-01-22 DIAGNOSIS — F17200 Nicotine dependence, unspecified, uncomplicated: Secondary | ICD-10-CM

## 2019-01-22 DIAGNOSIS — B20 Human immunodeficiency virus [HIV] disease: Secondary | ICD-10-CM

## 2019-01-22 NOTE — Assessment & Plan Note (Signed)
Encouraged her to take metamucil, colace.  Drink more water.

## 2019-01-22 NOTE — Assessment & Plan Note (Signed)
Well controlled today.

## 2019-01-22 NOTE — Assessment & Plan Note (Signed)
Will set her up for shingles vax Will give her PCV 23 Does not need condoms.  Will refill her ensure.  rtc in 9 months

## 2019-01-22 NOTE — Progress Notes (Signed)
   Subjective:    Patient ID: [name], female    DOB: 1960/06/17, 58 y.o.   MRN: [id number]  HPI 58yo F with HIV+ and cured Hep C (no u/s) and tobacco use. Has been on Odefsy, prior was on atripla (for convenience) since 2007 after having been successfully maintained on Nelfinavirfor many years.  She was changed to biktarvy 11-2018 due to protonix.  Last pap and mammo were (-) 6-2019and5-2019respectively.Was told she needs PAP every 5 years by her provider. She had colon 06-2016 (single tubular adenoma, hemorrhoids).Next set at 10 yrs.  She has been having foot pain. Has been seen at podiatry. Still has some tightening.  Works 10h days, 7 days then off 3.   Wants to get shingles vax.     HIV 1 RNA Quant (copies/mL)  Date Value  12/17/2018 <20 NOT DETECTED  03/24/2018 <20 DETECTED (A)  07/03/2017 <20 NOT DETECTED   CD4 T Cell Abs (/uL)  Date Value  12/17/2018 479  03/24/2018 460  07/03/2017 550    Review of Systems  Constitutional: Negative for appetite change, chills, fever and unexpected weight change.  Respiratory: Negative for cough and shortness of breath.   Gastrointestinal: Positive for constipation (every 2 weeks. limited water. ). Negative for diarrhea.  Genitourinary: Negative for difficulty urinating.  Psychiatric/Behavioral: Negative for sleep disturbance.       Objective:   Physical Exam Vitals reviewed.  Constitutional:      Appearance: Normal appearance.  HENT:     Mouth/Throat:     Mouth: Mucous membranes are moist.     Pharynx: Oropharynx is clear.  Eyes:     Extraocular Movements: Extraocular movements intact.     Pupils: Pupils are equal, round, and reactive to light.  Cardiovascular:     Rate and Rhythm: Normal rate and regular rhythm.  Pulmonary:     Effort: Pulmonary effort is normal.     Breath sounds: Normal breath sounds.  Abdominal:     General: Bowel sounds are normal. There is no distension.     Palpations: Abdomen  is soft.     Tenderness: There is no abdominal tenderness.  Musculoskeletal:        General: Normal range of motion.     Cervical back: Normal range of motion and neck supple.     Right lower leg: No edema.     Left lower leg: No edema.  Neurological:     General: No focal deficit present.     Mental Status: She is alert.  Psychiatric:        Mood and Affect: Mood normal.           Assessment & Plan:

## 2019-01-22 NOTE — Assessment & Plan Note (Signed)
Encouraged to quit. 

## 2019-01-28 ENCOUNTER — Other Ambulatory Visit: Payer: Self-pay

## 2019-01-28 ENCOUNTER — Encounter: Payer: Self-pay | Admitting: Infectious Diseases

## 2019-01-28 DIAGNOSIS — B20 Human immunodeficiency virus [HIV] disease: Secondary | ICD-10-CM

## 2019-01-28 MED ORDER — ENSURE PO LIQD: 1.0000 | Freq: Three times a day (TID) | ORAL | 11 refills | Status: DC

## 2019-02-04 ENCOUNTER — Other Ambulatory Visit: Payer: Self-pay | Admitting: Family Medicine

## 2019-02-04 DIAGNOSIS — Z1231 Encounter for screening mammogram for malignant neoplasm of breast: Secondary | ICD-10-CM

## 2019-02-11 ENCOUNTER — Telehealth: Payer: Self-pay | Admitting: Internal Medicine

## 2019-02-16 NOTE — Telephone Encounter (Signed)
Sent to Np °

## 2019-02-23 ENCOUNTER — Ambulatory Visit: Payer: Self-pay | Admitting: Nurse Practitioner

## 2019-03-02 ENCOUNTER — Other Ambulatory Visit: Payer: Self-pay

## 2019-03-02 ENCOUNTER — Encounter: Payer: Self-pay | Admitting: Nurse Practitioner

## 2019-03-02 ENCOUNTER — Ambulatory Visit (INDEPENDENT_AMBULATORY_CARE_PROVIDER_SITE_OTHER): Payer: Self-pay | Admitting: Nurse Practitioner

## 2019-03-02 ENCOUNTER — Encounter: Payer: Self-pay | Admitting: Podiatry

## 2019-03-02 ENCOUNTER — Other Ambulatory Visit: Payer: Self-pay | Admitting: Podiatry

## 2019-03-02 ENCOUNTER — Ambulatory Visit (INDEPENDENT_AMBULATORY_CARE_PROVIDER_SITE_OTHER): Payer: 59 | Admitting: Podiatry

## 2019-03-02 ENCOUNTER — Ambulatory Visit (INDEPENDENT_AMBULATORY_CARE_PROVIDER_SITE_OTHER): Payer: 59

## 2019-03-02 ENCOUNTER — Ambulatory Visit: Payer: 59 | Admitting: Podiatry

## 2019-03-02 VITALS — BP 119/80 | HR 77 | Temp 98.0°F | Resp 16 | Ht 69.0 in | Wt 149.0 lb

## 2019-03-02 VITALS — Temp 98.0°F

## 2019-03-02 DIAGNOSIS — M778 Other enthesopathies, not elsewhere classified: Secondary | ICD-10-CM

## 2019-03-02 DIAGNOSIS — F32A Depression, unspecified: Secondary | ICD-10-CM

## 2019-03-02 DIAGNOSIS — M7751 Other enthesopathy of right foot: Secondary | ICD-10-CM | POA: Diagnosis not present

## 2019-03-02 DIAGNOSIS — J0110 Acute frontal sinusitis, unspecified: Secondary | ICD-10-CM

## 2019-03-02 DIAGNOSIS — M79672 Pain in left foot: Secondary | ICD-10-CM

## 2019-03-02 DIAGNOSIS — J329 Chronic sinusitis, unspecified: Secondary | ICD-10-CM | POA: Insufficient documentation

## 2019-03-02 DIAGNOSIS — B20 Human immunodeficiency virus [HIV] disease: Secondary | ICD-10-CM

## 2019-03-02 DIAGNOSIS — F329 Major depressive disorder, single episode, unspecified: Secondary | ICD-10-CM

## 2019-03-02 DIAGNOSIS — I10 Essential (primary) hypertension: Secondary | ICD-10-CM

## 2019-03-02 MED ORDER — AMOXICILLIN-POT CLAVULANATE 875-125 MG PO TABS: 1.0000 | ORAL_TABLET | Freq: Two times a day (BID) | ORAL | 0 refills | Status: DC

## 2019-03-02 MED ORDER — MIRTAZAPINE 30 MG PO TABS: 30.0000 mg | ORAL_TABLET | Freq: Every day | ORAL | 2 refills | Status: DC

## 2019-03-02 MED ORDER — DICLOFENAC SODIUM 75 MG PO TBEC: 75.0000 mg | DELAYED_RELEASE_TABLET | Freq: Two times a day (BID) | ORAL | 2 refills | Status: DC

## 2019-03-02 NOTE — Progress Notes (Signed)
Acute Office Visit  Subjective:    Patient ID: [name], female    DOB: [birth year], 59 y.o.   MRN: [id number]  Chief Complaint  Patient presents with  . Headache  . Epistaxis  . Sinus Problem    feels sinuses are congested.   . Depression    asking for medication to be increased     HPI Patient is in today for evaluation.  She has been having increasing headaches and sinus pressure.  She admits this has been going on for a while. She has sharp pain in her forehead occasionally. She denies any dizziness. She denies any fever and sore throat. She denies any cough, chest pain or shortness of breath. She has been losing weigh. She had a bloody nose with dryness only  She has green sputum.    She is having sadness .  She admits that her dad died last year and her son moved out. Her son had ADHD. She does not have suicidal ideation.  She has decreased appetite.  She eats once daily and then snacks. She does continue to smoke . She has not spoken with a therapist. She admits that she does not feel this is needed at this time.    Past Medical History:  Diagnosis Date  . Anxiety 11/2018  . Asthma   . Hepatitis C   . HIV (human immunodeficiency virus infection) ([place])   . Underweight 07/29/2014    Past Surgical History:  Procedure Laterality Date  . COLONOSCOPY WITH PROPOFOL N/A 06/26/2016   Procedure: COLONOSCOPY WITH PROPOFOL;  Surgeon: Otis Brace, MD;  Location: [facility];  Service: Gastroenterology;  Laterality: N/A;  . LAPAROSCOPIC APPENDECTOMY N/A 07/20/2018   Procedure: APPENDECTOMY LAPAROSCOPIC;  Surgeon: Clovis Riley, MD;  Location: [facility];  Service: General;  Laterality: N/A;  . PANCREATECTOMY      Family History  Problem Relation Age of Onset  . Cancer Mother        bone  . Diabetes Sister   . Colon cancer Maternal Uncle   . Esophageal cancer Neg Hx   . Gastric cancer Neg Hx   . Pancreatic cancer Neg Hx   . Liver disease Neg Hx   .  Inflammatory bowel disease Neg Hx     Social History   Socioeconomic History  . Marital status: Single    Spouse name: Not on file  . Number of children: Not on file  . Years of education: Not on file  . Highest education level: Not on file  Occupational History  . Not on file  Tobacco Use  . Smoking status: Current Every Day Smoker    Packs/day: 0.50    Years: 33.00    Pack years: 16.50    Types: Cigarettes  . Smokeless tobacco: Never Used  . Tobacco comment: cutting back  Substance and Sexual Activity  . Alcohol use: No    Alcohol/week: 0.0 standard drinks    Comment: recovered alcoholic for 10 years  . 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 Sexual activity: Never    Comment: pt. given condoms  Other Topics Concern  . Not on file  Social History Narrative  . Not on file   Social Determinants of Health   Financial Resource Strain:   . Difficulty of Paying Living Expenses: Not on file  Food Insecurity:   . Worried About Charity fundraiser in the Last Year: Not on file  .  Ran Out of Food in the Last Year: Not on file  Transportation Needs:   . Lack of Transportation (Medical): Not on file  . Lack of Transportation (Non-Medical): Not on file  Physical Activity:   . Days of Exercise per Week: Not on file  . Minutes of Exercise per Session: Not on file  Stress:   . Feeling of Stress : Not on file  Social Connections:   . Frequency of Communication with Friends and Family: Not on file  . Frequency of Social Gatherings with Friends and Family: Not on file  . Attends Religious Services: Not on file  . Active Member of Clubs or Organizations: Not on file  . Attends Archivist Meetings: Not on file  . Marital Status: Not on file  Intimate Partner Violence:   . Fear of Current or Ex-Partner: Not on file  . Emotionally Abused: Not on file  . Physically Abused: Not on file  . Sexually Abused: Not on file    Outpatient Medications Prior to Visit   Medication Sig Dispense Refill  . acetaminophen (TYLENOL) 500 MG tablet Take 1 tablet (500 mg total) by mouth every 6 (six) hours as needed. (Patient taking differently: Take 1,000 mg by mouth every 6 (six) hours as needed for moderate pain (abdominal pain). ) 30 tablet 0  . amLODipine (NORVASC) 5 MG tablet Take 1 tablet (5 mg total) by mouth daily. 90 tablet 3  . bictegravir-emtricitabine-tenofovir AF (BIKTARVY) 50-200-25 MG TABS tablet Take 1 tablet by mouth daily. 30 tablet 5  . busPIRone (BUSPAR) 5 MG tablet Take 1 tablet (5 mg total) by mouth 2 (two) times daily. 60 tablet 2  . cyclobenzaprine (FLEXERIL) 10 MG tablet Take 1 tablet (10 mg total) by mouth 3 (three) times daily as needed for muscle spasms. 30 tablet 0  . diclofenac (VOLTAREN) 75 MG EC tablet Take 1 tablet (75 mg total) by mouth 2 (two) times daily. 50 tablet 2  . diphenhydrAMINE (BENADRYL) 50 MG tablet Take 1 tablet (50 mg total) by mouth every 8 (eight) hours as needed for itching. 30 tablet 0  . Ensure (ENSURE) Take 1 Can by mouth 3 (three) times daily between meals. 240 mL 11  . fluticasone (FLONASE) 50 MCG/ACT nasal spray Place 2 sprays into both nostrils daily. 16 g 6  . ibuprofen (ADVIL) 600 MG tablet Take 1 tablet (600 mg total) by mouth every 6 (six) hours as needed for mild pain. 30 tablet 0  . traMADol (ULTRAM) 50 MG tablet Take 1 tablet (50 mg total) by mouth every 6 (six) hours as needed for severe pain. 15 tablet 0  . triamcinolone (KENALOG) 0.025 % ointment Apply 1 application topically 2 (two) times daily. 30 g 0   No facility-administered medications prior to visit.    Allergies  Allergen Reactions  . Aspirin Other (See Comments)    heart murmur    Review of Systems  Constitutional: Positive for appetite change.       Decreased appetite  HENT: Positive for sinus pressure and sinus pain.   Eyes: Negative.   Respiratory: Negative.   Cardiovascular: Negative.   Gastrointestinal: Negative.   Endocrine:  Negative.   Genitourinary: Negative.   Musculoskeletal: Negative.   Allergic/Immunologic: Negative.   Neurological: Positive for headaches.  Hematological: Negative.   Psychiatric/Behavioral: Negative.        Objective:    Physical Exam Constitutional:      Appearance: She is well-developed.  HENT:  Head: Normocephalic.     Mouth/Throat:     Mouth: Mucous membranes are moist.  Eyes:     Extraocular Movements: Extraocular movements intact.     Pupils: Pupils are equal, round, and reactive to light.  Cardiovascular:     Rate and Rhythm: Normal rate.     Heart sounds: Normal heart sounds.  Pulmonary:     Effort: Pulmonary effort is normal.  Abdominal:     Palpations: Abdomen is soft.  Musculoskeletal:        General: Normal range of motion.     Cervical back: Normal range of motion and neck supple.  Skin:    General: Skin is warm and dry.  Neurological:     Mental Status: She is alert and oriented to person, place, and time.  Psychiatric:        Mood and Affect: Mood is depressed.        Speech: Speech normal.        Behavior: Behavior normal.     BP 119/80 (BP Location: Right Arm, Patient Position: Sitting, Cuff Size: Normal)   Pulse 77   Temp 98 F (36.7 C) (Oral)   Resp 16   Ht 5\' 9"  (1.753 m)   Wt 149 lb (67.6 kg)   LMP 03/27/2011   SpO2 100%   BMI 22.00 kg/m  Wt Readings from Last 3 Encounters:  03/02/19 149 lb (67.6 kg)  01/22/19 148 lb (67.1 kg)  11/17/18 151 lb (68.5 kg)    Health Maintenance Due  Topic Date Due  . PAP SMEAR-Modifier  08/07/2018    There are no preventive care reminders to display for this patient.   Lab Results  Component Value Date   TSH 1.64 03/19/2016   Lab Results  Component Value Date   WBC 3.9 12/17/2018   HGB 12.2 12/17/2018   HCT 38.0 12/17/2018   MCV 92.0 12/17/2018   PLT 210 12/17/2018   Lab Results  Component Value Date   NA 140 12/17/2018   K 4.8 12/17/2018   CO2 26 12/17/2018   GLUCOSE 112 (H)  12/17/2018   BUN 13 12/17/2018   CREATININE 1.24 (H) 12/17/2018   BILITOT 0.4 12/17/2018   ALKPHOS 109 07/20/2018   AST 21 12/17/2018   ALT 12 12/17/2018   PROT 7.4 12/17/2018   ALBUMIN 3.9 07/20/2018   CALCIUM 9.4 12/17/2018   ANIONGAP 11 07/21/2018   Lab Results  Component Value Date   CHOL 138 12/17/2018   Lab Results  Component Value Date   HDL 57 12/17/2018   Lab Results  Component Value Date   LDLCALC 64 12/17/2018   Lab Results  Component Value Date   TRIG 89 12/17/2018   Lab Results  Component Value Date   CHOLHDL 2.4 12/17/2018   Lab Results  Component Value Date   HGBA1C 4.8 03/19/2016       Assessment & Plan:   Problem List Items Addressed This Visit      Unprioritized   Depression   Relevant Medications   mirtazapine (REMERON) 30 MG tablet Patient would like to see if this would help with depression and some weight gain   Essential hypertension - Primary   Human immunodeficiency virus (HIV) disease (HCC)   Sinusitis   Relevant Medications   amoxicillin-clavulanate (AUGMENTIN) 875-125 MG tablet We will try Coricidin HBP first if not effective over the next 48 hours we will try above therapy       Meds ordered this encounter  Medications  . mirtazapine (REMERON) 30 MG tablet    Sig: Take 1 tablet (30 mg total) by mouth at bedtime.    Dispense:  30 tablet    Refill:  2    Order Specific Question:   Supervising Provider    Answer:   Tresa Garter [id number]  . amoxicillin-clavulanate (AUGMENTIN) 875-125 MG tablet    Sig: Take 1 tablet by mouth 2 (two) times daily.    Dispense:  20 tablet    Refill:  0    Order Specific Question:   Supervising Provider    Answer:   Tresa Garter [id number]     Vevelyn Francois, NP

## 2019-03-02 NOTE — Patient Instructions (Addendum)

## 2019-03-02 NOTE — Progress Notes (Signed)
Subjective:   Patient ID: [name], female   DOB: 59 y.o.   MRN: [id number]   HPI Patient states has developed quite a bit of discomfort on the top of the right foot and sometimes it feels numb and then an occasional sharp pain occurs with sometimes chronic pain.  States that it is very tender with palpation.  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  Did sterile prep and injected the extensor complex 3 mg Kenalog 5 mg Xylocaine and then went ahead applied fascial brace to lift up the arch giving instructions for supportive therapy.  Reappoint to recheck  X-rays indicate there is some arthritis at the midtarsal joint right with spur formation with moderate flatfoot deformity

## 2019-03-11 ENCOUNTER — Ambulatory Visit
Admission: RE | Admit: 2019-03-11 | Discharge: 2019-03-11 | Disposition: A | Discharge status: Home / Self Care | Payer: 59 | Source: Ambulatory Visit | Attending: Family Medicine | Admitting: Family Medicine

## 2019-03-11 ENCOUNTER — Other Ambulatory Visit: Payer: Self-pay

## 2019-03-11 DIAGNOSIS — Z1231 Encounter for screening mammogram for malignant neoplasm of breast: Secondary | ICD-10-CM

## 2019-03-16 ENCOUNTER — Other Ambulatory Visit: Payer: Self-pay

## 2019-03-16 ENCOUNTER — Ambulatory Visit (INDEPENDENT_AMBULATORY_CARE_PROVIDER_SITE_OTHER): Payer: 59 | Admitting: Podiatry

## 2019-03-16 ENCOUNTER — Encounter: Payer: Self-pay | Admitting: Podiatry

## 2019-03-16 DIAGNOSIS — M778 Other enthesopathies, not elsewhere classified: Secondary | ICD-10-CM

## 2019-03-16 NOTE — Progress Notes (Signed)
Subjective:   Patient ID: [name], female   DOB: 59 y.o.   MRN: [id number]   HPI Patient states she is feeling a lot better with significant diminishment of discomfort   ROS      Objective:  Physical Exam  Neurovascular status intact with significant discomfort that has been reduced in the right dorsal foot medial with flatfoot deformity noted     Assessment:  Flatfoot type structure of the foot with inflammation dorsal and medial side right foot     Plan:  H&P conditions reviewed recommended the utilization of brace along with insert therapy and patient will be seen back if symptoms were to get worse but hopefully this will be the end of her symptomatology

## 2019-04-10 ENCOUNTER — Telehealth: Payer: Self-pay | Admitting: Nurse Practitioner

## 2019-04-10 NOTE — Telephone Encounter (Signed)
Pt was called and reminded of there appointment 

## 2019-04-13 ENCOUNTER — Encounter: Payer: Self-pay | Admitting: Nurse Practitioner

## 2019-04-13 ENCOUNTER — Ambulatory Visit (INDEPENDENT_AMBULATORY_CARE_PROVIDER_SITE_OTHER): Payer: 59 | Admitting: Nurse Practitioner

## 2019-04-13 ENCOUNTER — Other Ambulatory Visit: Payer: Self-pay

## 2019-04-13 VITALS — BP 118/74 | HR 83 | Temp 98.3°F | Resp 16 | Ht 69.0 in | Wt 156.0 lb

## 2019-04-13 DIAGNOSIS — N814 Uterovaginal prolapse, unspecified: Secondary | ICD-10-CM | POA: Diagnosis not present

## 2019-04-13 DIAGNOSIS — K5900 Constipation, unspecified: Secondary | ICD-10-CM | POA: Diagnosis not present

## 2019-04-13 DIAGNOSIS — F32A Depression, unspecified: Secondary | ICD-10-CM

## 2019-04-13 DIAGNOSIS — F329 Major depressive disorder, single episode, unspecified: Secondary | ICD-10-CM

## 2019-04-13 MED ORDER — MIRTAZAPINE 45 MG PO TABS: 45.0000 mg | ORAL_TABLET | Freq: Every day | ORAL | 0 refills | Status: DC

## 2019-04-13 NOTE — Progress Notes (Signed)
Established Patient Office Visit  Subjective:  Patient ID: [name], female    DOB: 03/31/1960  Age: 60 y.o. MRN: [id number]  CC:  Chief Complaint  Patient presents with  . Follow-up    6 week follow up for remeron. depression about the same.   . Vaginal Prolapse    pt states buldge in vagina ?    HPI [name] presents for follow up. [name]  has a past medical history of Anxiety (11/2018), Asthma, Hepatitis C, HIV (human immunodeficiency virus infection) ([place]), and Underweight (07/29/2014). [name] was started on Remeron 30 mg and Buspar 5mg . This is for dual therapy , depression and underweight. [name] has a normal BMI of 23. However [name] does not like the current weight and desires to gain more. [name] feels like her depression has improved. [name] is able to sleep. [name] does not feel like the Buspar has been effective in her anxiety treatment. [name] would like to discontinue this. Denies headache, dizziness, visual changes, shortness of breath, dyspnea on exertion, chest pain, nausea, vomiting or any edema.  [name] does have a problem with chronic constipation.  [name] admits that [name] was on Linzess in the past .However [name] was given samples.  [name] feels like it was effective however her insurance will not cover it.  [name] would like a referral to GI to see if [name] can get some additional samples. [name] has noticed that [name] is having a bulge in her vagina.  [name] is unable to give me the exact date but this started.  [name] admits that [name] cleans it and go. Denies any pelvic pain or tenderness, amenorrhea irregular bleeding or prolonged heavy bleeding.  Denies vaginal discharge or dysuria.  Denies ulcers or lesions   Past Medical History:  Diagnosis Date  . Anxiety 11/2018  . Asthma   . Hepatitis C   . HIV (human immunodeficiency virus infection) ([place])   . Underweight 07/29/2014    Past Surgical History:  Procedure Laterality Date  . COLONOSCOPY WITH PROPOFOL N/A 06/26/2016   Procedure:  COLONOSCOPY WITH PROPOFOL;  Surgeon: Otis Brace, MD;  Location: [facility];  Service: Gastroenterology;  Laterality: N/A;  . LAPAROSCOPIC APPENDECTOMY N/A 07/20/2018   Procedure: APPENDECTOMY LAPAROSCOPIC;  Surgeon: Clovis Riley, MD;  Location: [facility];  Service: General;  Laterality: N/A;  . PANCREATECTOMY      Family History  Problem Relation Age of Onset  . Cancer Mother        bone  . Diabetes Sister   . Colon cancer Maternal Uncle   . Esophageal cancer Neg Hx   . Gastric cancer Neg Hx   . Pancreatic cancer Neg Hx   . Liver disease Neg Hx   . Inflammatory bowel disease Neg Hx     Social History   Socioeconomic History  . Marital status: Single    Spouse name: Not on file  . Number of children: Not on file  . Years of education: Not on file  . Highest education level: Not on file  Occupational History  . Not on file  Tobacco Use  . Smoking status: Current Every Day Smoker    Packs/day: 0.50    Years: 33.00    Pack years: 16.50    Types: Cigarettes  . Smokeless tobacco: Never Used  . Tobacco comment: cutting back  Substance and Sexual Activity  . Alcohol use: No    Alcohol/week: 0.0 standard drinks    Comment: recovered alcoholic for 10 years  .  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 Sexual activity: Never    Comment: pt. given condoms  Other Topics Concern  . Not on file  Social History Narrative  . Not on file   Social Determinants of Health   Financial Resource Strain:   . Difficulty of Paying Living Expenses: Not on file  Food Insecurity:   . Worried About Charity fundraiser in the Last Year: Not on file  . Ran Out of Food in the Last Year: Not on file  Transportation Needs:   . Lack of Transportation (Medical): Not on file  . Lack of Transportation (Non-Medical): Not on file  Physical Activity:   . Days of Exercise per Week: Not on file  . Minutes of Exercise per Session: Not on file  Stress:   . Feeling of Stress : Not on file   Social Connections:   . Frequency of Communication with Friends and Family: Not on file  . Frequency of Social Gatherings with Friends and Family: Not on file  . Attends Religious Services: Not on file  . Active Member of Clubs or Organizations: Not on file  . Attends Archivist Meetings: Not on file  . Marital Status: Not on file  Intimate Partner Violence:   . Fear of Current or Ex-Partner: Not on file  . Emotionally Abused: Not on file  . Physically Abused: Not on file  . Sexually Abused: Not on file    Outpatient Medications Prior to Visit  Medication Sig Dispense Refill  . acetaminophen (TYLENOL) 500 MG tablet Take 1 tablet (500 mg total) by mouth every 6 (six) hours as needed. (Patient taking differently: Take 1,000 mg by mouth every 6 (six) hours as needed for moderate pain (abdominal pain). ) 30 tablet 0  . amLODipine (NORVASC) 5 MG tablet Take 1 tablet (5 mg total) by mouth daily. 90 tablet 3  . bictegravir-emtricitabine-tenofovir AF (BIKTARVY) 50-200-25 MG TABS tablet Take 1 tablet by mouth daily. 30 tablet 5  . busPIRone (BUSPAR) 5 MG tablet Take 1 tablet (5 mg total) by mouth 2 (two) times daily. 60 tablet 2  . diclofenac (VOLTAREN) 75 MG EC tablet Take 1 tablet (75 mg total) by mouth 2 (two) times daily. 50 tablet 2  . diphenhydrAMINE (BENADRYL) 50 MG tablet Take 1 tablet (50 mg total) by mouth every 8 (eight) hours as needed for itching. 30 tablet 0  . Ensure (ENSURE) Take 1 Can by mouth 3 (three) times daily between meals. 240 mL 11  . fluticasone (FLONASE) 50 MCG/ACT nasal spray Place 2 sprays into both nostrils daily. 16 g 6  . ibuprofen (ADVIL) 600 MG tablet Take 1 tablet (600 mg total) by mouth every 6 (six) hours as needed for mild pain. 30 tablet 0  . mirtazapine (REMERON) 30 MG tablet Take 1 tablet (30 mg total) by mouth at bedtime. 30 tablet 2  . cyclobenzaprine (FLEXERIL) 10 MG tablet Take 1 tablet (10 mg total) by mouth 3 (three) times daily as needed  for muscle spasms. (Patient not taking: Reported on 04/13/2019) 30 tablet 0  . amoxicillin-clavulanate (AUGMENTIN) 875-125 MG tablet Take 1 tablet by mouth 2 (two) times daily. 20 tablet 0  . traMADol (ULTRAM) 50 MG tablet Take 1 tablet (50 mg total) by mouth every 6 (six) hours as needed for severe pain. 15 tablet 0  . triamcinolone (KENALOG) 0.025 % ointment Apply 1 application topically 2 (two) times daily. 30 g  0   No facility-administered medications prior to visit.    Allergies  Allergen Reactions  . Aspirin Other (See Comments)    heart murmur    ROS Review of Systems  All other systems reviewed and are negative.     Objective:    Physical Exam  Constitutional: [name] is oriented to person, place, and time. [name] appears well-developed and well-nourished.  HENT:  Head: Normocephalic.  Cardiovascular: Normal rate.  Pulmonary/Chest: Effort normal.  Musculoskeletal:        General: Normal range of motion.     Cervical back: Normal range of motion.  Neurological: [name] is alert and oriented to person, place, and time.  Skin: Skin is warm and dry.  Psychiatric: [name] has a normal mood and affect. Her behavior is normal. Judgment and thought content normal.    BP 118/74 (BP Location: Left Arm, Patient Position: Sitting, Cuff Size: Normal)   Pulse 83   Temp 98.3 F (36.8 C) (Oral)   Resp 16   Ht 5\' 9"  (1.753 m)   Wt 156 lb (70.8 kg)   LMP 03/27/2011   SpO2 100%   BMI 23.04 kg/m  Wt Readings from Last 3 Encounters:  04/13/19 156 lb (70.8 kg)  03/02/19 149 lb (67.6 kg)  01/22/19 148 lb (67.1 kg)     Health Maintenance Due  Topic Date Due  . PAP SMEAR-Modifier  08/07/2018    There are no preventive care reminders to display for this patient.  Lab Results  Component Value Date   TSH 1.64 03/19/2016   Lab Results  Component Value Date   WBC 3.9 12/17/2018   HGB 12.2 12/17/2018   HCT 38.0 12/17/2018   MCV 92.0 12/17/2018   PLT 210 12/17/2018   Lab Results   Component Value Date   NA 140 12/17/2018   K 4.8 12/17/2018   CO2 26 12/17/2018   GLUCOSE 112 (H) 12/17/2018   BUN 13 12/17/2018   CREATININE 1.24 (H) 12/17/2018   BILITOT 0.4 12/17/2018   ALKPHOS 109 07/20/2018   AST 21 12/17/2018   ALT 12 12/17/2018   PROT 7.4 12/17/2018   ALBUMIN 3.9 07/20/2018   CALCIUM 9.4 12/17/2018   ANIONGAP 11 07/21/2018   Lab Results  Component Value Date   CHOL 138 12/17/2018   Lab Results  Component Value Date   HDL 57 12/17/2018   Lab Results  Component Value Date   LDLCALC 64 12/17/2018   Lab Results  Component Value Date   TRIG 89 12/17/2018   Lab Results  Component Value Date   CHOLHDL 2.4 12/17/2018   Lab Results  Component Value Date   HGBA1C 4.8 03/19/2016      Assessment & Plan:   Problem List Items Addressed This Visit      Unprioritized   Constipation   Relevant Orders   Ambulatory referral to Gastroenterology   Depression - Primary   Relevant Medications   mirtazapine (REMERON) 45 MG tablet    Other Visit Diagnoses    Uterine prolapse       GYN referral for evaluation   Relevant Orders   Ambulatory referral to Obstetrics / Gynecology      Meds ordered this encounter  Medications  . mirtazapine (REMERON) 45 MG tablet    Sig: Take 1 tablet (45 mg total) by mouth at bedtime.    Dispense:  90 tablet    Refill:  0    Order Specific Question:   Supervising Provider  AnswerTresa Garter [id number]    Follow-up: Return in about 3 months (around 07/14/2019).    Vevelyn Francois, NP

## 2019-05-18 ENCOUNTER — Ambulatory Visit: Payer: Self-pay | Admitting: Family Medicine

## 2019-05-20 ENCOUNTER — Other Ambulatory Visit: Payer: Self-pay

## 2019-05-20 ENCOUNTER — Ambulatory Visit (INDEPENDENT_AMBULATORY_CARE_PROVIDER_SITE_OTHER): Payer: 59 | Admitting: Obstetrics & Gynecology

## 2019-05-20 VITALS — BP 135/79 | HR 79 | Ht 72.0 in | Wt 153.9 lb

## 2019-05-20 DIAGNOSIS — N3941 Urge incontinence: Secondary | ICD-10-CM

## 2019-05-20 DIAGNOSIS — N8110 Cystocele, unspecified: Secondary | ICD-10-CM | POA: Diagnosis not present

## 2019-05-20 NOTE — Patient Instructions (Signed)

## 2019-05-20 NOTE — Progress Notes (Signed)
Patient ID: [name], female   DOB: 07-Aug-1960, 59 y.o.   MRN: [id number]  Chief Complaint  Patient presents with  . Gynecologic Exam  evaluated pelvic prolapse  HPI [name] is a 59 y.o. female.  [id number] Postmenopausal, notices bulge at vagina when standing. Urinary urge and occasional incontinence.  HPI  Past Medical History:  Diagnosis Date  . Anxiety 11/2018  . Asthma   . Hepatitis C   . HIV (human immunodeficiency virus infection) ([place])   . Underweight 07/29/2014    Past Surgical History:  Procedure Laterality Date  . COLONOSCOPY WITH PROPOFOL N/A 06/26/2016   Procedure: COLONOSCOPY WITH PROPOFOL;  Surgeon: Otis Brace, MD;  Location: [facility];  Service: Gastroenterology;  Laterality: N/A;  . LAPAROSCOPIC APPENDECTOMY N/A 07/20/2018   Procedure: APPENDECTOMY LAPAROSCOPIC;  Surgeon: Clovis Riley, MD;  Location: [facility];  Service: General;  Laterality: N/A;  . PANCREATECTOMY      Family History  Problem Relation Age of Onset  . Cancer Mother        bone  . Diabetes Sister   . Colon cancer Maternal Uncle   . Esophageal cancer Neg Hx   . Gastric cancer Neg Hx   . Pancreatic cancer Neg Hx   . Liver disease Neg Hx   . Inflammatory bowel disease Neg Hx     Social History Social History   Tobacco Use  . Smoking status: Current Every Day Smoker    Packs/day: 0.50    Years: 33.00    Pack years: 16.50    Types: Cigarettes  . Smokeless tobacco: Never Used  . Tobacco comment: cutting back  Substance Use Topics  . Alcohol use: No    Alcohol/week: 0.0 standard drinks    Comment: recovered alcoholic for 10 years  . Drug use: No    Comment: clean for 10 years    Allergies  Allergen Reactions  . Aspirin Other (See Comments)    heart murmur    Current Outpatient Medications  Medication Sig Dispense Refill  . acetaminophen (TYLENOL) 500 MG tablet Take 1 tablet (500 mg total) by mouth every 6 (six) hours as needed. (Patient taking  differently: Take 1,000 mg by mouth every 6 (six) hours as needed for moderate pain (abdominal pain). ) 30 tablet 0  . amLODipine (NORVASC) 5 MG tablet Take 1 tablet (5 mg total) by mouth daily. 90 tablet 3  . bictegravir-emtricitabine-tenofovir AF (BIKTARVY) 50-200-25 MG TABS tablet Take 1 tablet by mouth daily. 30 tablet 5  . busPIRone (BUSPAR) 5 MG tablet Take 1 tablet (5 mg total) by mouth 2 (two) times daily. 60 tablet 2  . diclofenac (VOLTAREN) 75 MG EC tablet Take 1 tablet (75 mg total) by mouth 2 (two) times daily. 50 tablet 2  . diphenhydrAMINE (BENADRYL) 50 MG tablet Take 1 tablet (50 mg total) by mouth every 8 (eight) hours as needed for itching. 30 tablet 0  . Ensure (ENSURE) Take 1 Can by mouth 3 (three) times daily between meals. 240 mL 11  . fluticasone (FLONASE) 50 MCG/ACT nasal spray Place 2 sprays into both nostrils daily. 16 g 6  . ibuprofen (ADVIL) 600 MG tablet Take 1 tablet (600 mg total) by mouth every 6 (six) hours as needed for mild pain. 30 tablet 0  . mirtazapine (REMERON) 45 MG tablet Take 1 tablet (45 mg total) by mouth at bedtime. 90 tablet 0  . cyclobenzaprine (FLEXERIL) 10 MG tablet Take 1 tablet (10 mg total) by  mouth 3 (three) times daily as needed for muscle spasms. (Patient not taking: Reported on 04/13/2019) 30 tablet 0   No current facility-administered medications for this visit.    Review of Systems Review of Systems  Constitutional: Negative.   Respiratory: Negative.   Genitourinary: Positive for frequency and urgency. Negative for dysuria, menstrual problem and pelvic pain.    Blood pressure 135/79, pulse 79, height 6' (1.829 m), weight 153 lb 14.4 oz (69.8 kg), last menstrual period 03/27/2011.  Physical Exam Physical Exam Pulmonary:     Effort: Pulmonary effort is normal.  Abdominal:     General: Abdomen is flat.  Genitourinary:    General: Normal vulva.     Vagina: No vaginal discharge.     Comments: Uterus well supported, mild atrophy, 1  degree cystocele standing with valsalva Skin:    General: Skin is warm and dry.  Neurological:     Mental Status: She is alert.  Psychiatric:        Mood and Affect: Mood normal.     Data Reviewed Pap 2019 nl  Assessment 1 degree cystocele with urinary urge, incontinence  Plan Referral to PT for pelvic floor training RTC 6 months prn    Emeterio Reeve 05/20/2019, 2:17 PM

## 2019-05-25 ENCOUNTER — Other Ambulatory Visit: Payer: Self-pay | Admitting: Family Medicine

## 2019-05-25 DIAGNOSIS — I10 Essential (primary) hypertension: Secondary | ICD-10-CM

## 2019-06-22 ENCOUNTER — Ambulatory Visit: Payer: 59 | Attending: Obstetrics & Gynecology | Admitting: Physical Therapy

## 2019-07-09 ENCOUNTER — Telehealth: Payer: Self-pay | Admitting: Podiatry

## 2019-07-09 NOTE — Telephone Encounter (Addendum)
Left message informing pt Dr. Paulla Dolly states she may work standing on a mat, and I would email to her current email address. Emailed letter to pt.

## 2019-07-09 NOTE — Telephone Encounter (Signed)
Pt called requesting note for work stating she can not walk back & forth on concrete due to foot pain.  Per patient she was hired to work and stand on a mat.

## 2019-07-10 ENCOUNTER — Telehealth: Payer: Self-pay | Admitting: Podiatry

## 2019-07-10 ENCOUNTER — Telehealth: Payer: Self-pay | Admitting: *Deleted

## 2019-07-10 NOTE — Telephone Encounter (Signed)
Left message informing pt I would add to the no walking on the concrete floors and using the mat. Emailed to pt.

## 2019-07-10 NOTE — Telephone Encounter (Signed)
Pt states she needs a note not to walk on the concrete.

## 2019-07-10 NOTE — Telephone Encounter (Signed)
Pt called again and would like a note for her job stating that she isn't able to stand on concrete without a mat. She says her job makes her walk around in different areas that isn't assessable for her feet.

## 2019-07-20 ENCOUNTER — Other Ambulatory Visit: Payer: Self-pay

## 2019-07-20 ENCOUNTER — Ambulatory Visit (INDEPENDENT_AMBULATORY_CARE_PROVIDER_SITE_OTHER): Payer: 59 | Admitting: Nurse Practitioner

## 2019-07-20 ENCOUNTER — Encounter: Payer: Self-pay | Admitting: Nurse Practitioner

## 2019-07-20 VITALS — BP 134/90 | HR 69 | Temp 98.0°F | Wt 156.0 lb

## 2019-07-20 DIAGNOSIS — F329 Major depressive disorder, single episode, unspecified: Secondary | ICD-10-CM

## 2019-07-20 DIAGNOSIS — I10 Essential (primary) hypertension: Secondary | ICD-10-CM

## 2019-07-20 DIAGNOSIS — Z0000 Encounter for general adult medical examination without abnormal findings: Secondary | ICD-10-CM | POA: Diagnosis not present

## 2019-07-20 DIAGNOSIS — B20 Human immunodeficiency virus [HIV] disease: Secondary | ICD-10-CM | POA: Diagnosis not present

## 2019-07-20 DIAGNOSIS — F32A Depression, unspecified: Secondary | ICD-10-CM

## 2019-07-20 MED ORDER — AMLODIPINE BESYLATE 5 MG PO TABS: ORAL_TABLET | ORAL | 3 refills | Status: DC

## 2019-07-20 NOTE — Patient Instructions (Signed)

## 2019-07-20 NOTE — Progress Notes (Signed)
[address] [place], [place]  [address] Phone:  [phone]   Fax:  [phone]   Established Patient Office Visit  Subjective:  Patient ID: [name], female    DOB: [birth year]  Age: 59 y.o. MRN: [id number]  CC:  Chief Complaint  Patient presents with  . Follow-up    discuss, medication that she was given she had hair loss     HPI [name] presents for follow up. She  has a past medical history of Anxiety (11/2018), Asthma, Hepatitis C, HIV (human immunodeficiency virus infection) ([place]), and Underweight (07/29/2014).   Depression Patient complains of depression and anxiety with weight loss. She has been on  Remeron for about 4 months. She felt like her depression and anxiety had improved. However she has discontinued the Remeron.  She complains of the following side effects from the treatment: hair loss.She admits that this was not one of the side effect that she has read but because this is the last medication that she was prescribed she contributed this to being the cause.   Hypertension Patient is here for follow-up of elevated blood pressure. She is exercising and is adherent to a low-salt diet. Blood pressure is well controlled at home. Cardiac symptoms: none. Patient denies chest pain, dyspnea, exertional chest pressure/discomfort, fatigue, irregular heart beat, lower extremity edema, palpitations and syncope. Cardiovascular risk factors: hypertension and smoking/ tobacco exposure. Use of agents associated with hypertension: none. History of target organ damage: none. Past Medical History:  Diagnosis Date  . Anxiety 11/2018  . Asthma   . Hepatitis C   . HIV (human immunodeficiency virus infection) ([place])   . Underweight 07/29/2014    Past Surgical History:  Procedure Laterality Date  . COLONOSCOPY WITH PROPOFOL N/A 06/26/2016   Procedure: COLONOSCOPY WITH PROPOFOL;  Surgeon: Otis Brace, MD;  [facility];  Service: Gastroenterology;  Laterality: N/A;  . LAPAROSCOPIC APPENDECTOMY N/A 07/20/2018   Procedure: APPENDECTOMY LAPAROSCOPIC;  Surgeon: Clovis Riley, MD;  [facility];  Service: General;  Laterality: N/A;  . PANCREATECTOMY      Family History  Problem Relation Age of Onset  . Cancer Mother        bone  . Diabetes Sister   . Colon cancer Maternal Uncle   . Esophageal cancer Neg Hx   . Gastric cancer Neg Hx   . Pancreatic cancer Neg Hx   . Liver disease Neg Hx   . Inflammatory bowel disease Neg Hx     Social History   Socioeconomic History  . Marital status: Single    Spouse name: Not on file  . Number of children: Not on file  . Years of education: Not on file  . Highest education level: Not on file  Occupational History  . Not on file  Tobacco Use  . Smoking status: Current Every Day Smoker    Packs/day: 0.50    Years: 33.00    Pack years: 16.50    Types: Cigarettes  . Smokeless tobacco: Never Used  . Tobacco comment: cutting back  Substance and Sexual Activity  . Alcohol use: No    Alcohol/week: 0.0 standard drinks    Comment: recovered alcoholic for 10 years  . 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 Sexual activity: Never    Comment: pt. given condoms  Other Topics Concern  . Not on file  Social History Narrative  . Not  on file   Social Determinants of Health   Financial Resource Strain:   . Difficulty of Paying Living Expenses:   Food Insecurity: Food Insecurity Present  . Worried About Charity fundraiser in the Last Year: Never true  . Ran Out of Food in the Last Year: Sometimes true  Transportation Needs: No Transportation Needs  . Lack of Transportation (Medical): No  . Lack of Transportation (Non-Medical): No  Physical Activity:   . Days of Exercise per Week:   . Minutes of Exercise per Session:   Stress:   . Feeling of Stress :   Social Connections:   . Frequency of Communication with Friends and Family:   .  Frequency of Social Gatherings with Friends and Family:   . Attends Religious Services:   . Active Member of Clubs or Organizations:   . Attends Archivist Meetings:   Marland Kitchen Marital Status:   Intimate Partner Violence:   . Fear of Current or Ex-Partner:   . Emotionally Abused:   Marland Kitchen Physically Abused:   . Sexually Abused:     Outpatient Medications Prior to Visit  Medication Sig Dispense Refill  . acetaminophen (TYLENOL) 500 MG tablet Take 1 tablet (500 mg total) by mouth every 6 (six) hours as needed. (Patient taking differently: Take 1,000 mg by mouth every 6 (six) hours as needed for moderate pain (abdominal pain). ) 30 tablet 0  . bictegravir-emtricitabine-tenofovir AF (BIKTARVY) 50-200-25 MG TABS tablet Take 1 tablet by mouth daily. 30 tablet 5  . Ensure (ENSURE) Take 1 Can by mouth 3 (three) times daily between meals. 240 mL 11  . fluticasone (FLONASE) 50 MCG/ACT nasal spray Place 2 sprays into both nostrils daily. 16 g 6  . amLODipine (NORVASC) 5 MG tablet TAKE 1 TABLET(5 MG) BY MOUTH DAILY 90 tablet 3  . busPIRone (BUSPAR) 5 MG tablet Take 1 tablet (5 mg total) by mouth 2 (two) times daily. 60 tablet 2  . cyclobenzaprine (FLEXERIL) 10 MG tablet Take 1 tablet (10 mg total) by mouth 3 (three) times daily as needed for muscle spasms. (Patient not taking: Reported on 04/13/2019) 30 tablet 0  . diclofenac (VOLTAREN) 75 MG EC tablet Take 1 tablet (75 mg total) by mouth 2 (two) times daily. (Patient not taking: Reported on 07/20/2019) 50 tablet 2  . diphenhydrAMINE (BENADRYL) 50 MG tablet Take 1 tablet (50 mg total) by mouth every 8 (eight) hours as needed for itching. (Patient not taking: Reported on 07/20/2019) 30 tablet 0  . ibuprofen (ADVIL) 600 MG tablet Take 1 tablet (600 mg total) by mouth every 6 (six) hours as needed for mild pain. (Patient not taking: Reported on 07/20/2019) 30 tablet 0  . mirtazapine (REMERON) 45 MG tablet Take 1 tablet (45 mg total) by mouth at bedtime. (Patient not  taking: Reported on 07/20/2019) 90 tablet 0   No facility-administered medications prior to visit.    Allergies  Allergen Reactions  . Aspirin Other (See Comments)    heart murmur    ROS Review of Systems  All other systems reviewed and are negative.     Objective:    Physical Exam  Constitutional: She is oriented to person, place, and time. She appears well-developed and well-nourished. No distress.  HENT:  Head: Normocephalic.  Eyes: Pupils are equal, round, and reactive to light.  Cardiovascular: Normal rate, regular rhythm, normal heart sounds and intact distal pulses.  Pulmonary/Chest: Effort normal and breath sounds normal.  Abdominal: Soft. Bowel sounds are  normal.  Musculoskeletal:        General: Normal range of motion.     Cervical back: Normal range of motion.  Neurological: She is alert and oriented to person, place, and time.  Skin: Skin is warm and dry. She is not diaphoretic.  Psychiatric: She has a normal mood and affect. Her behavior is normal. Judgment and thought content normal.    BP 134/90 (BP Location: Left Arm, Patient Position: Sitting)   Pulse 69   Temp 98 F (36.7 C) (Temporal)   Wt 156 lb (70.8 kg)   LMP 03/27/2011   SpO2 100%   BMI 21.16 kg/m  Wt Readings from Last 3 Encounters:  07/20/19 156 lb (70.8 kg)  05/20/19 153 lb 14.4 oz (69.8 kg)  04/13/19 156 lb (70.8 kg)     Health Maintenance Due  Topic Date Due  . COVID-19 Vaccine (1) Never done  . PAP SMEAR-Modifier  08/07/2018    There are no preventive care reminders to display for this patient.  Lab Results  Component Value Date   TSH 1.64 03/19/2016   Lab Results  Component Value Date   WBC 3.9 12/17/2018   HGB 12.2 12/17/2018   HCT 38.0 12/17/2018   MCV 92.0 12/17/2018   PLT 210 12/17/2018   Lab Results  Component Value Date   NA 140 12/17/2018   K 4.8 12/17/2018   CO2 26 12/17/2018   GLUCOSE 112 (H) 12/17/2018   BUN 13 12/17/2018   CREATININE 1.24 (H)  12/17/2018   BILITOT 0.4 12/17/2018   ALKPHOS 109 07/20/2018   AST 21 12/17/2018   ALT 12 12/17/2018   PROT 7.4 12/17/2018   ALBUMIN 3.9 07/20/2018   CALCIUM 9.4 12/17/2018   ANIONGAP 11 07/21/2018   Lab Results  Component Value Date   CHOL 138 12/17/2018   Lab Results  Component Value Date   HDL 57 12/17/2018   Lab Results  Component Value Date   LDLCALC 64 12/17/2018   Lab Results  Component Value Date   TRIG 89 12/17/2018   Lab Results  Component Value Date   CHOLHDL 2.4 12/17/2018   Lab Results  Component Value Date   HGBA1C 4.8 03/19/2016      Assessment & Plan:   Problem List Items Addressed This Visit      Cardiovascular and Mediastinum   Essential hypertension   Relevant Medications   amLODipine (NORVASC) 5 MG tablet   Other Relevant Orders   CBC with Differential/Platelet   Comp. Metabolic Panel (12)     Other   Depression - Primary   Human immunodeficiency virus (HIV) disease ([place])    Other Visit Diagnoses    Healthcare maintenance       Relevant Orders   Lipid panel      Meds ordered this encounter  Medications  . amLODipine (NORVASC) 5 MG tablet    Sig: TAKE 1 TABLET(5 MG) BY MOUTH DAILY    Dispense:  90 tablet    Refill:  3    Order Specific Question:   Supervising Provider    Answer:   Tresa Garter [[id number]]    Follow-up: Return in about 3 months (around 10/20/2019).    Vevelyn Francois, NP

## 2019-07-21 LAB — CBC WITH DIFFERENTIAL/PLATELET
Basophils Absolute: 0 10*3/uL (ref 0.0–0.2)
Basos: 1 %
EOS (ABSOLUTE): 0.1 10*3/uL (ref 0.0–0.4)
Eos: 2 %
Hematocrit: 41.4 % (ref 34.0–46.6)
Hemoglobin: 13 g/dL (ref 11.1–15.9)
Immature Grans (Abs): 0 10*3/uL (ref 0.0–0.1)
Immature Granulocytes: 0 %
Lymphocytes Absolute: 1.3 10*3/uL (ref 0.7–3.1)
Lymphs: 34 %
MCH: 28.8 pg (ref 26.6–33.0)
MCHC: 31.4 g/dL — ABNORMAL LOW (ref 31.5–35.7)
MCV: 92 fL (ref 79–97)
Monocytes Absolute: 0.4 10*3/uL (ref 0.1–0.9)
Monocytes: 12 %
Neutrophils Absolute: 2 10*3/uL (ref 1.4–7.0)
Neutrophils: 51 %
Platelets: 199 10*3/uL (ref 150–450)
RBC: 4.51 x10E6/uL (ref 3.77–5.28)
RDW: 11.9 % (ref 11.7–15.4)
WBC: 3.8 10*3/uL (ref 3.4–10.8)

## 2019-07-21 LAB — COMP. METABOLIC PANEL (12)
AST: 22 IU/L (ref 0–40)
Albumin/Globulin Ratio: 1.5 (ref 1.2–2.2)
Albumin: 4.6 g/dL (ref 3.8–4.9)
Alkaline Phosphatase: 148 IU/L — ABNORMAL HIGH (ref 48–121)
BUN/Creatinine Ratio: 14 (ref 9–23)
BUN: 14 mg/dL (ref 6–24)
Bilirubin Total: 0.4 mg/dL (ref 0.0–1.2)
Calcium: 9.6 mg/dL (ref 8.7–10.2)
Chloride: 104 mmol/L (ref 96–106)
Creatinine, Ser: 0.98 mg/dL (ref 0.57–1.00)
GFR calc Af Amer: 74 mL/min/{1.73_m2} (ref >59)
GFR calc non Af Amer: 64 mL/min/{1.73_m2} (ref >59)
Globulin, Total: 3.1 g/dL (ref 1.5–4.5)
Glucose: 90 mg/dL (ref 65–99)
Potassium: 4.4 mmol/L (ref 3.5–5.2)
Sodium: 142 mmol/L (ref 134–144)
Total Protein: 7.7 g/dL (ref 6.0–8.5)

## 2019-07-21 LAB — LIPID PANEL
Chol/HDL Ratio: 2.6 ratio (ref 0.0–4.4)
Cholesterol, Total: 169 mg/dL (ref 100–199)
HDL: 66 mg/dL (ref >39)
LDL Chol Calc (NIH): 90 mg/dL (ref 0–99)
Triglycerides: 68 mg/dL (ref 0–149)
VLDL Cholesterol Cal: 13 mg/dL (ref 5–40)

## 2019-09-07 ENCOUNTER — Other Ambulatory Visit: Payer: Self-pay

## 2019-09-07 ENCOUNTER — Ambulatory Visit (INDEPENDENT_AMBULATORY_CARE_PROVIDER_SITE_OTHER): Payer: 59 | Admitting: Nurse Practitioner

## 2019-09-07 ENCOUNTER — Encounter: Payer: Self-pay | Admitting: Nurse Practitioner

## 2019-09-07 VITALS — BP 143/83 | HR 75 | Temp 98.4°F | Resp 16 | Ht 72.0 in | Wt 152.0 lb

## 2019-09-07 DIAGNOSIS — R0981 Nasal congestion: Secondary | ICD-10-CM | POA: Diagnosis not present

## 2019-09-07 DIAGNOSIS — K0510 Chronic gingivitis, plaque induced: Secondary | ICD-10-CM | POA: Diagnosis not present

## 2019-09-07 DIAGNOSIS — I10 Essential (primary) hypertension: Secondary | ICD-10-CM

## 2019-09-07 MED ORDER — AMOXICILLIN-POT CLAVULANATE 875-125 MG PO TABS
1.0000 | ORAL_TABLET | Freq: Two times a day (BID) | ORAL | 0 refills | Status: DC
Start: 2019-09-07 — End: 2020-02-29

## 2019-09-07 NOTE — Progress Notes (Signed)
[address] [place], [place]  [address] Phone:  [phone]   Fax:  [phone]   Acute Office Visit  Subjective:    Patient ID: [name], female    DOB: 1960/07/30, 59 y.o.   MRN: [id number]  Chief Complaint  Patient presents with   Headache   Nasal Congestion    sneezing     HPI Patient is in today for headache and nasal congestion. She  has a past medical history of Anxiety (11/2018), Asthma, Hepatitis C, HIV (human immunodeficiency virus infection) ([place]), and Underweight (07/29/2014).   Sinus Pain Patient complains of headaches, nasal congestion and sore throat. Onset of symptoms was several days ago. Symptoms have been stable since that time. She is not drinking much.  Past history is significant for pneumonia and questionable COPD. Patient is smoker  (0.5ppd x33 yrs).  Denies fever, chills, headache, dizziness, visual changes, shortness of breath, dyspnea on exertion, chest pain, nausea, vomiting or any edema.   She was recently seen at the dentist.  She was told that she has infection in her gums.  She denies any active bleeding.  She admits that she does have tenderness if she touches her gum but denies any swelling or difficulty eating.  She admits that she does not have recurrent co-pay to see the endodontist. Past Medical History:  Diagnosis Date   Anxiety 11/2018   Asthma    Hepatitis C    HIV (human immunodeficiency virus infection) ([place])    Underweight 07/29/2014    Past Surgical History:  Procedure Laterality Date   COLONOSCOPY WITH PROPOFOL N/A 06/26/2016   Procedure: COLONOSCOPY WITH PROPOFOL;  Surgeon: Otis Brace, MD;  [facility];  Service: Gastroenterology;  Laterality: N/A;   LAPAROSCOPIC APPENDECTOMY N/A 07/20/2018   Procedure: APPENDECTOMY LAPAROSCOPIC;  Surgeon: Clovis Riley, MD;  [facility];  Service: General;  Laterality: N/A;   PANCREATECTOMY      Family History    Problem Relation Age of Onset   Cancer Mother        bone   Diabetes Sister    Colon cancer Maternal Uncle    Esophageal cancer Neg Hx    Gastric cancer Neg Hx    Pancreatic cancer Neg Hx    Liver disease Neg Hx    Inflammatory bowel disease Neg Hx     Social History   Socioeconomic History   Marital status: Single    Spouse name: Not on file   Number of children: Not on file   Years of education: Not on file   Highest education level: Not on file  Occupational History   Not on file  Tobacco Use   Smoking status: Current Every Day Smoker    Packs/day: 0.50    Years: 33.00    Pack years: 16.50    Types: Cigarettes   Smokeless tobacco: Never Used   Tobacco comment: cutting back  Vaping Use   Vaping Use: Never used  Substance and Sexual Activity   Alcohol use: No    Alcohol/week: 0.0 standard drinks    Comment: recovered alcoholic for 10 years   Drug use: No    Comment: clean for 10 years   Sexual activity: Never    Comment: pt. given condoms  Other Topics Concern   Not on file  Social History Narrative   Not on file   Social Determinants of Health   Financial Resource Strain:    Difficulty  of Paying Living Expenses:   Food Insecurity: Food Insecurity Present   Worried About Charity fundraiser in the Last Year: Never true   Ran Out of Food in the Last Year: Sometimes true  Transportation Needs: No Transportation Needs   Lack of Transportation (Medical): No   Lack of Transportation (Non-Medical): No  Physical Activity:    Days of Exercise per Week:    Minutes of Exercise per Session:   Stress:    Feeling of Stress :   Social Connections:    Frequency of Communication with Friends and Family:    Frequency of Social Gatherings with Friends and Family:    Attends Religious Services:    Active Member of Clubs or Organizations:    Attends Music therapist:    Marital Status:   Intimate Partner Violence:     Fear of Current or Ex-Partner:    Emotionally Abused:    Physically Abused:    Sexually Abused:     Outpatient Medications Prior to Visit  Medication Sig Dispense Refill   acetaminophen (TYLENOL) 500 MG tablet Take 1 tablet (500 mg total) by mouth every 6 (six) hours as needed. (Patient taking differently: Take 1,000 mg by mouth every 6 (six) hours as needed for moderate pain (abdominal pain). ) 30 tablet 0   amLODipine (NORVASC) 5 MG tablet TAKE 1 TABLET(5 MG) BY MOUTH DAILY 90 tablet 3   bictegravir-emtricitabine-tenofovir AF (BIKTARVY) 50-200-25 MG TABS tablet Take 1 tablet by mouth daily. 30 tablet 5   Ensure (ENSURE) Take 1 Can by mouth 3 (three) times daily between meals. 240 mL 11   fluticasone (FLONASE) 50 MCG/ACT nasal spray Place 2 sprays into both nostrils daily. 16 g 6   No facility-administered medications prior to visit.    Allergies  Allergen Reactions   Aspirin Other (See Comments)    heart murmur    Review of Systems  HENT: Positive for dental problem.        Objective:    Physical Exam Vitals reviewed.  Constitutional:      General: She is not in acute distress.    Appearance: She is well-developed. She is not ill-appearing, toxic-appearing or diaphoretic.  HENT:     Head: Normocephalic and atraumatic.     Right Ear: Tympanic membrane normal.     Left Ear: Tympanic membrane normal.     Nose:     Comments: samll amount of swelling    Mouth/Throat:   Eyes:     Extraocular Movements: Extraocular movements intact.  Cardiovascular:     Rate and Rhythm: Normal rate and regular rhythm.     Pulses: Normal pulses.     Heart sounds: Normal heart sounds.  Pulmonary:     Effort: Pulmonary effort is normal.     Breath sounds: Normal breath sounds.  Musculoskeletal:        General: Normal range of motion.     Cervical back: Normal range of motion.  Skin:    General: Skin is warm and dry.     Capillary Refill: Capillary refill takes less than 2  seconds.  Neurological:     General: No focal deficit present.     Mental Status: She is alert and oriented to person, place, and time.  Psychiatric:        Mood and Affect: Mood normal.        Behavior: Behavior normal.        Thought Content: Thought content normal.  Judgment: Judgment normal.     BP (!) 143/83 (BP Location: Right Arm, Patient Position: Sitting, Cuff Size: Normal)    Pulse 75    Temp 98.4 F (36.9 C) (Oral)    Resp 16    Ht 6' (1.829 m)    Wt 152 lb (68.9 kg)    LMP 03/27/2011    BMI 20.61 kg/m  Wt Readings from Last 3 Encounters:  09/07/19 152 lb (68.9 kg)  07/20/19 156 lb (70.8 kg)  05/20/19 153 lb 14.4 oz (69.8 kg)    Health Maintenance Due  Topic Date Due   PAP SMEAR-Modifier  08/07/2018    There are no preventive care reminders to display for this patient.   Lab Results  Component Value Date   TSH 1.64 03/19/2016   Lab Results  Component Value Date   WBC 3.8 07/20/2019   HGB 13.0 07/20/2019   HCT 41.4 07/20/2019   MCV 92 07/20/2019   PLT 199 07/20/2019   Lab Results  Component Value Date   NA 142 07/20/2019   K 4.4 07/20/2019   CO2 26 12/17/2018   GLUCOSE 90 07/20/2019   BUN 14 07/20/2019   CREATININE 0.98 07/20/2019   BILITOT 0.4 07/20/2019   ALKPHOS 148 (H) 07/20/2019   AST 22 07/20/2019   ALT 12 12/17/2018   PROT 7.7 07/20/2019   ALBUMIN 4.6 07/20/2019   CALCIUM 9.6 07/20/2019   ANIONGAP 11 07/21/2018   Lab Results  Component Value Date   CHOL 169 07/20/2019   Lab Results  Component Value Date   HDL 66 07/20/2019   Lab Results  Component Value Date   LDLCALC 90 07/20/2019   Lab Results  Component Value Date   TRIG 68 07/20/2019   Lab Results  Component Value Date   CHOLHDL 2.6 07/20/2019   Lab Results  Component Value Date   HGBA1C 4.8 03/19/2016       Assessment & Plan:   Problem List Items Addressed This Visit      Cardiovascular and Mediastinum   Essential hypertension Patient to monitor  blood pressure at least weekly and follow-up if blood pressure remains greater than 140s over 90s for possible adjustment in her Norvasc    Other Visit Diagnoses    Sinus congestion    -  Primary Encourage patient to continue with Flonase as directed Augmentin 875 mg twice daily with food for 10 days  That may also help to a degree with gum infection   Inflamed gum     Encourage patient to follow-up with dentist for annual cleaning        Meds ordered this encounter  Medications   amoxicillin-clavulanate (AUGMENTIN) 875-125 MG tablet    Sig: Take 1 tablet by mouth 2 (two) times daily.    Dispense:  20 tablet    Refill:  0    Order Specific Question:   Supervising Provider    Answer:   Tresa Garter [[id number]]     Vevelyn Francois, NP

## 2019-09-07 NOTE — Patient Instructions (Addendum)
Managing Your Hypertension Hypertension is commonly called high blood pressure. This is when the force of your blood pressing against the walls of your arteries is too strong. Arteries are blood vessels that carry blood from your heart throughout your body. Hypertension forces the heart to work harder to pump blood, and may cause the arteries to become narrow or stiff. Having untreated or uncontrolled hypertension can cause heart attack, stroke, kidney disease, and other problems. What are blood pressure readings? A blood pressure reading consists of a higher number over a lower number. Ideally, your blood pressure should be below 120/80. The first ("top") number is called the systolic pressure. It is a measure of the pressure in your arteries as your heart beats. The second ("bottom") number is called the diastolic pressure. It is a measure of the pressure in your arteries as the heart relaxes. What does my blood pressure reading mean? Blood pressure is classified into four stages. Based on your blood pressure reading, your health care provider may use the following stages to determine what type of treatment you need, if any. Systolic pressure and diastolic pressure are measured in a unit called mm Hg. Normal  Systolic pressure: below 120.  Diastolic pressure: below 80. Elevated  Systolic pressure: 120-129.  Diastolic pressure: below 80. Hypertension stage 1  Systolic pressure: 130-139.  Diastolic pressure: 80-89. Hypertension stage 2  Systolic pressure: 140 or above.  Diastolic pressure: 90 or above. What health risks are associated with hypertension? Managing your hypertension is an important responsibility. Uncontrolled hypertension can lead to:  A heart attack.  A stroke.  A weakened blood vessel (aneurysm).  Heart failure.  Kidney damage.  Eye damage.  Metabolic syndrome.  Memory and concentration problems. What changes can I make to manage my  hypertension? Hypertension can be managed by making lifestyle changes and possibly by taking medicines. Your health care provider will help you make a plan to bring your blood pressure within a normal range. Eating and drinking   Eat a diet that is high in fiber and potassium, and low in salt (sodium), added sugar, and fat. An example eating plan is called the DASH (Dietary Approaches to Stop Hypertension) diet. To eat this way: ? Eat plenty of fresh fruits and vegetables. Try to fill half of your plate at each meal with fruits and vegetables. ? Eat whole grains, such as whole wheat pasta, brown rice, or whole grain bread. Fill about one quarter of your plate with whole grains. ? Eat low-fat diary products. ? Avoid fatty cuts of meat, processed or cured meats, and poultry with skin. Fill about one quarter of your plate with lean proteins such as fish, chicken without skin, beans, eggs, and tofu. ? Avoid premade and processed foods. These tend to be higher in sodium, added sugar, and fat.  Reduce your daily sodium intake. Most people with hypertension should eat less than 1,500 mg of sodium a day.  Limit alcohol intake to no more than 1 drink a day for nonpregnant women and 2 drinks a day for men. One drink equals 12 oz of beer, 5 oz of wine, or 1 oz of hard liquor. Lifestyle  Work with your health care provider to maintain a healthy body weight, or to lose weight. Ask what an ideal weight is for you.  Get at least 30 minutes of exercise that causes your heart to beat faster (aerobic exercise) most days of the week. Activities may include walking, swimming, or biking.  Include exercise   to strengthen your muscles (resistance exercise), such as weight lifting, as part of your weekly exercise routine. Try to do these types of exercises for 30 minutes at least 3 days a week.  Do not use any products that contain nicotine or tobacco, such as cigarettes and e-cigarettes. If you need help quitting,  ask your health care provider.  Control any long-term (chronic) conditions you have, such as high cholesterol or diabetes. Monitoring  Monitor your blood pressure at home as told by your health care provider. Your personal target blood pressure may vary depending on your medical conditions, your age, and other factors.  Have your blood pressure checked regularly, as often as told by your health care provider. Working with your health care provider  Review all the medicines you take with your health care provider because there may be side effects or interactions.  Talk with your health care provider about your diet, exercise habits, and other lifestyle factors that may be contributing to hypertension.  Visit your health care provider regularly. Your health care provider can help you create and adjust your plan for managing hypertension. Will I need medicine to control my blood pressure? Your health care provider may prescribe medicine if lifestyle changes are not enough to get your blood pressure under control, and if:  Your systolic blood pressure is 130 or higher.  Your diastolic blood pressure is 80 or higher. Take medicines only as told by your health care provider. Follow the directions carefully. Blood pressure medicines must be taken as prescribed. The medicine does not work as well when you skip doses. Skipping doses also puts you at risk for problems. Contact a health care provider if:  You think you are having a reaction to medicines you have taken.  You have repeated (recurrent) headaches.  You feel dizzy.  You have swelling in your ankles.  You have trouble with your vision. Get help right away if:  You develop a severe headache or confusion.  You have unusual weakness or numbness, or you feel faint.  You have severe pain in your chest or abdomen.  You vomit repeatedly.  You have trouble breathing. Summary  Hypertension is when the force of blood pumping  through your arteries is too strong. If this condition is not controlled, it may put you at risk for serious complications.  Your personal target blood pressure may vary depending on your medical conditions, your age, and other factors. For most people, a normal blood pressure is less than 120/80.  Hypertension is managed by lifestyle changes, medicines, or both. Lifestyle changes include weight loss, eating a healthy, low-sodium diet, exercising more, and limiting alcohol. This information is not intended to replace advice given to you by your health care provider. Make sure you discuss any questions you have with your health care provider. Document Revised: 05/23/2018 Document Reviewed: 12/28/2015 Elsevier Patient Education  [place]. Sinus Headache  A sinus headache happens when your sinuses get swollen or blocked (clogged). Sinuses are spaces behind the bones of your face and forehead. You may feel pain or pressure in your face, forehead, ears, or upper teeth. Sinus headaches can be mild or very bad. Follow these instructions at home: General instructions  If told: ? Apply a warm, moist washcloth to your face. This can help to lessen pain. ? Use a nasal saline wash. Follow the directions on the bottle or box. Medicines   Take over-the-counter and prescription medicines only as told by your doctor.  If  you were prescribed an antibiotic medicine, take it as told by your doctor. Do not stop taking it even if you start to feel better.  Use a nose spray if your nose feels full of mucus (congested). Hydrate and humidify  Drink enough water to keep your pee (urine) pale yellow.  Use a cool mist humidifier to keep the humidity level in your home above 50%.  Breathe in steam for 10-15 minutes, 3-4 times a day or as told by your doctor. You can do this in the bathroom while a hot shower is running.  Try not to spend time in cool or dry air. Contact a doctor if:  You get more  than one headache a week.  Light or sound bothers you.  You have a fever.  You feel sick to your stomach (nauseous) or you throw up (vomit).  Your headaches do not get better with treatment. Get help right away if:  You have trouble seeing.  You suddenly have very bad pain in your face or head.  You start to have quick, sudden movements or shaking that you cannot control (seizure).  You are confused.  You have a stiff neck. Summary  A sinus headache happens when your sinuses get swollen or blocked (clogged). Sinuses are spaces behind the bones of your face and forehead.  You may feel pain or pressure in your face, forehead, ears, or upper teeth.  Take over-the-counter and prescription medicines only as told by your doctor.  If told, apply a warm, moist washcloth to your face. This can help to lessen pain. This information is not intended to replace advice given to you by your health care provider. Make sure you discuss any questions you have with your health care provider. Document Revised: 01/11/2017 Document Reviewed: 11/09/2016 Elsevier Patient Education  [place] Headache Without Cause A headache is pain or discomfort that is felt around the head or neck area. There are many causes and types of headaches. In some cases, the cause may not be found. Follow these instructions at home: Watch your condition for any changes. Let your doctor know about them. Take these steps to help with your condition: Managing pain      Take over-the-counter and prescription medicines only as told by your doctor.  Lie down in a dark, quiet room when you have a headache.  If told, put ice on your head and neck area: ? Put ice in a plastic bag. ? Place a towel between your skin and the bag. ? Leave the ice on for 20 minutes, 2-3 times per day.  If told, put heat on the affected area. Use the heat source that your doctor recommends, such as a moist heat pack or a  heating pad. ? Place a towel between your skin and the heat source. ? Leave the heat on for 20-30 minutes. ? Remove the heat if your skin turns bright red. This is very important if you are unable to feel pain, heat, or cold. You may have a greater risk of getting burned.  Keep lights dim if bright lights bother you or make your headaches worse. Eating and drinking  Eat meals on a regular schedule.  If you drink alcohol: ? Limit how much you use to:  0-1 drink a day for women.  0-2 drinks a day for men. ? Be aware of how much alcohol is in your drink. In the [place] one drink equals one 12 oz bottle of beer (355  mL), one 5 oz glass of wine (148 mL), or one 1 oz glass of hard liquor (44 mL).  Stop drinking caffeine, or reduce how much caffeine you drink. General instructions   Keep a journal to find out if certain things bring on headaches. For example, write down: ? What you eat and drink. ? How much sleep you get. ? Any change to your diet or medicines.  Get a massage or try other ways to relax.  Limit stress.  Sit up straight. Do not tighten (tense) your muscles.  Do not use any products that contain nicotine or tobacco. This includes cigarettes, e-cigarettes, and chewing tobacco. If you need help quitting, ask your doctor.  Exercise regularly as told by your doctor.  Get enough sleep. This often means 7-9 hours of sleep each night.  Keep all follow-up visits as told by your doctor. This is important. Contact a doctor if:  Your symptoms are not helped by medicine.  You have a headache that feels different than the other headaches.  You feel sick to your stomach (nauseous) or you throw up (vomit).  You have a fever. Get help right away if:  Your headache gets very bad quickly.  Your headache gets worse after a lot of physical activity.  You keep throwing up.  You have a stiff neck.  You have trouble seeing.  You have trouble speaking.  You have pain in  the eye or ear.  Your muscles are weak or you lose muscle control.  You lose your balance or have trouble walking.  You feel like you will pass out (faint) or you pass out.  You are mixed up (confused).  You have a seizure. Summary  A headache is pain or discomfort that is felt around the head or neck area.  There are many causes and types of headaches. In some cases, the cause may not be found.  Keep a journal to help find out what causes your headaches. Watch your condition for any changes. Let your doctor know about them.  Contact a doctor if you have a headache that is different from usual, or if your headache is not helped by medicine.  Get help right away if your headache gets very bad, you throw up, you have trouble seeing, you lose your balance, or you have a seizure. This information is not intended to replace advice given to you by your health care provider. Make sure you discuss any questions you have with your health care provider. Document Revised: 08/19/2017 Document Reviewed: 08/19/2017 Elsevier Patient Education  [place].  Constipation, Adult Constipation is when a person:  Poops (has a bowel movement) fewer times in a week than normal.  Has a hard time pooping.  Has poop that is dry, hard, or bigger than normal. Follow these instructions at home: Eating and drinking   Eat foods that have a lot of fiber, such as: ? Fresh fruits and vegetables. ? Whole grains. ? Beans.  Eat less of foods that are high in fat, low in fiber, or overly processed, such as: ? [place] fries. ? Hamburgers. ? Cookies. ? Candy. ? Soda.  Drink enough fluid to keep your pee (urine) clear or pale yellow. General instructions  Exercise regularly or as told by your doctor.  Go to the restroom when you feel like you need to poop. Do not hold it in.  Take over-the-counter and prescription medicines only as told by your doctor. These include any fiber  supplements.  Do pelvic floor retraining exercises, such as: ? Doing deep breathing while relaxing your lower belly (abdomen). ? Relaxing your pelvic floor while pooping.  Watch your condition for any changes.  Keep all follow-up visits as told by your doctor. This is important. Contact a doctor if:  You have pain that gets worse.  You have a fever.  You have not pooped for 4 days.  You throw up (vomit).  You are not hungry.  You lose weight.  You are bleeding from the anus.  You have thin, pencil-like poop (stool). Get help right away if:  You have a fever, and your symptoms suddenly get worse.  You leak poop or have blood in your poop.  Your belly feels hard or bigger than normal (is bloated).  You have very bad belly pain.  You feel dizzy or you faint. This information is not intended to replace advice given to you by your health care provider. Make sure you discuss any questions you have with your health care provider. Document Revised: 01/11/2017 Document Reviewed: 07/20/2015 Elsevier Patient Education  2020 [employer].

## 2019-10-08 ENCOUNTER — Other Ambulatory Visit: Payer: 59

## 2019-10-08 ENCOUNTER — Other Ambulatory Visit: Payer: Self-pay

## 2019-10-08 DIAGNOSIS — Z113 Encounter for screening for infections with a predominantly sexual mode of transmission: Secondary | ICD-10-CM

## 2019-10-08 DIAGNOSIS — Z79899 Other long term (current) drug therapy: Secondary | ICD-10-CM

## 2019-10-08 DIAGNOSIS — B20 Human immunodeficiency virus [HIV] disease: Secondary | ICD-10-CM

## 2019-10-09 LAB — T-HELPER CELL (CD4) - (RCID CLINIC ONLY)
CD4 % Helper T Cell: 28 % — ABNORMAL LOW (ref 33–65)
CD4 T Cell Abs: 419 /uL (ref 400–1790)

## 2019-10-12 LAB — COMPREHENSIVE METABOLIC PANEL
AG Ratio: 1.4 (calc) (ref 1.0–2.5)
ALT: 11 U/L (ref 6–29)
AST: 18 U/L (ref 10–35)
Albumin: 4.2 g/dL (ref 3.6–5.1)
Alkaline phosphatase (APISO): 119 U/L (ref 37–153)
BUN/Creatinine Ratio: 14 (calc) (ref 6–22)
BUN: 15 mg/dL (ref 7–25)
CO2: 28 mmol/L (ref 20–32)
Calcium: 9.6 mg/dL (ref 8.6–10.4)
Chloride: 103 mmol/L (ref 98–110)
Creat: 1.11 mg/dL — ABNORMAL HIGH (ref 0.50–1.05)
Globulin: 3 g/dL (calc) (ref 1.9–3.7)
Glucose, Bld: 78 mg/dL (ref 65–99)
Potassium: 4.1 mmol/L (ref 3.5–5.3)
Sodium: 138 mmol/L (ref 135–146)
Total Bilirubin: 0.3 mg/dL (ref 0.2–1.2)
Total Protein: 7.2 g/dL (ref 6.1–8.1)

## 2019-10-12 LAB — CBC
HCT: 38.6 % (ref 35.0–45.0)
Hemoglobin: 12.4 g/dL (ref 11.7–15.5)
MCH: 29.5 pg (ref 27.0–33.0)
MCHC: 32.1 g/dL (ref 32.0–36.0)
MCV: 91.7 fL (ref 80.0–100.0)
MPV: 10.9 fL (ref 7.5–12.5)
Platelets: 207 10*3/uL (ref 140–400)
RBC: 4.21 10*6/uL (ref 3.80–5.10)
RDW: 12.5 % (ref 11.0–15.0)
WBC: 4.1 10*3/uL (ref 3.8–10.8)

## 2019-10-12 LAB — FLUORESCENT TREPONEMAL AB(FTA)-IGG-BLD: Fluorescent Treponemal ABS: NONREACTIVE

## 2019-10-12 LAB — LIPID PANEL
Cholesterol: 159 mg/dL (ref <200)
HDL: 54 mg/dL (ref >=50)
LDL Cholesterol (Calc): 85 mg/dL (calc)
Non-HDL Cholesterol (Calc): 105 mg/dL (calc) (ref <130)
Total CHOL/HDL Ratio: 2.9 (calc) (ref <5.0)
Triglycerides: 108 mg/dL (ref <150)

## 2019-10-12 LAB — RPR TITER: RPR Titer: 1:2 {titer} — ABNORMAL HIGH

## 2019-10-12 LAB — HIV-1 RNA QUANT-NO REFLEX-BLD
HIV 1 RNA Quant: 27 Copies/mL — ABNORMAL HIGH
HIV-1 RNA Quant, Log: 1.43 Log cps/mL — ABNORMAL HIGH

## 2019-10-12 LAB — RPR: RPR Ser Ql: REACTIVE — AB

## 2019-10-22 ENCOUNTER — Encounter: Payer: Self-pay | Admitting: Infectious Diseases

## 2019-10-22 ENCOUNTER — Other Ambulatory Visit: Payer: Self-pay

## 2019-10-22 ENCOUNTER — Ambulatory Visit (INDEPENDENT_AMBULATORY_CARE_PROVIDER_SITE_OTHER): Payer: 59 | Admitting: Infectious Diseases

## 2019-10-22 VITALS — BP 124/84 | HR 89 | Temp 98.1°F | Ht 72.0 in | Wt 150.0 lb

## 2019-10-22 DIAGNOSIS — Z113 Encounter for screening for infections with a predominantly sexual mode of transmission: Secondary | ICD-10-CM

## 2019-10-22 DIAGNOSIS — Z23 Encounter for immunization: Secondary | ICD-10-CM | POA: Diagnosis not present

## 2019-10-22 DIAGNOSIS — B20 Human immunodeficiency virus [HIV] disease: Secondary | ICD-10-CM

## 2019-10-22 DIAGNOSIS — I10 Essential (primary) hypertension: Secondary | ICD-10-CM

## 2019-10-22 DIAGNOSIS — J0110 Acute frontal sinusitis, unspecified: Secondary | ICD-10-CM

## 2019-10-22 DIAGNOSIS — Z79899 Other long term (current) drug therapy: Secondary | ICD-10-CM | POA: Diagnosis not present

## 2019-10-22 DIAGNOSIS — K5909 Other constipation: Secondary | ICD-10-CM

## 2019-10-22 MED ORDER — SALINE SPRAY 0.65 % NA SOLN: 1.0000 | NASAL | 3 refills | Status: DC | PRN

## 2019-10-22 NOTE — Assessment & Plan Note (Signed)
She continues to take OTCs, I encouraged her to keep up her water intake.

## 2019-10-22 NOTE — Assessment & Plan Note (Signed)
Her nasal saline washes are refilled.

## 2019-10-22 NOTE — Assessment & Plan Note (Signed)
Doing well Flu vax today Has had COVID vax Offered/refused condoms.  He pap/mammo are uptodate.  rtc in 9 months

## 2019-10-22 NOTE — Assessment & Plan Note (Signed)
Doing well Appreciate PCP f/u.

## 2019-10-22 NOTE — Progress Notes (Signed)
   Subjective:    Patient ID: Laray Anger, female    DOB: [birth year], 59 y.o.   MRN: [id number]  HPI 59yo F with HIV+ and cured Hep C (u/s 2009 no elastogram) and tobacco use. Has been on Odefsy, prior was on atripla (for convenience) since 2007 after having been successfully maintained on Nelfinavirfor many years.  She was changed to biktarvy 11-2018 due to protonix.  Last pap and mammo were (-) 6-2019and5-2019respectively.Was told she needs PAP every 5 years by her provider. She had colon 06-2016 (single tubular adenoma, hemorrhoids).Next set at 10 yrs.  Has been doing well.  Still working hard.  Had mammo 02-2019. Normal.  Would like refill of sinus nasal wash.   HIV 1 RNA Quant  Date Value  10/08/2019 27 Copies/mL (H)  12/17/2018 <20 NOT DETECTED copies/mL  03/24/2018 <20 DETECTED copies/mL (A)   CD4 T Cell Abs (/uL)  Date Value  10/08/2019 419  12/17/2018 479  03/24/2018 460    Review of Systems  Constitutional: Negative for appetite change, chills, fever and unexpected weight change.  Respiratory: Negative for cough and shortness of breath.   Gastrointestinal: Positive for constipation. Negative for diarrhea.  Genitourinary: Negative for difficulty urinating.  Psychiatric/Behavioral: Positive for sleep disturbance.       Objective:   Physical Exam Vitals reviewed.  Constitutional:      Appearance: Normal appearance.  HENT:     Mouth/Throat:     Mouth: Mucous membranes are moist.     Pharynx: No oropharyngeal exudate.  Eyes:     Extraocular Movements: Extraocular movements intact.     Pupils: Pupils are equal, round, and reactive to light.  Cardiovascular:     Rate and Rhythm: Normal rate and regular rhythm.  Pulmonary:     Effort: Pulmonary effort is normal.     Breath sounds: Normal breath sounds.  Abdominal:     General: Bowel sounds are normal. There is no distension.     Palpations: Abdomen is soft.     Tenderness: There is no  abdominal tenderness.  Musculoskeletal:     Cervical back: Normal range of motion and neck supple.     Right lower leg: No edema.     Left lower leg: No edema.  Lymphadenopathy:     Cervical: No cervical adenopathy.  Skin:    Findings: Lesion (small insect bite on L medial shin) present.  Neurological:     General: No focal deficit present.     Mental Status: She is alert.  Psychiatric:        Mood and Affect: Mood normal.           Assessment & Plan:

## 2019-10-26 ENCOUNTER — Ambulatory Visit: Payer: 59 | Admitting: Nurse Practitioner

## 2019-11-02 ENCOUNTER — Ambulatory Visit (INDEPENDENT_AMBULATORY_CARE_PROVIDER_SITE_OTHER): Payer: 59 | Admitting: Podiatry

## 2019-11-02 ENCOUNTER — Encounter: Payer: Self-pay | Admitting: Podiatry

## 2019-11-02 ENCOUNTER — Ambulatory Visit: Payer: 59 | Admitting: Podiatry

## 2019-11-02 ENCOUNTER — Other Ambulatory Visit: Payer: Self-pay

## 2019-11-02 DIAGNOSIS — M21612 Bunion of left foot: Secondary | ICD-10-CM

## 2019-11-02 DIAGNOSIS — M21611 Bunion of right foot: Secondary | ICD-10-CM

## 2019-11-02 DIAGNOSIS — L84 Corns and callosities: Secondary | ICD-10-CM

## 2019-11-02 DIAGNOSIS — M2012 Hallux valgus (acquired), left foot: Secondary | ICD-10-CM | POA: Diagnosis not present

## 2019-11-02 DIAGNOSIS — M2011 Hallux valgus (acquired), right foot: Secondary | ICD-10-CM

## 2019-11-02 DIAGNOSIS — M2141 Flat foot [pes planus] (acquired), right foot: Secondary | ICD-10-CM | POA: Diagnosis not present

## 2019-11-02 DIAGNOSIS — R52 Pain, unspecified: Secondary | ICD-10-CM | POA: Diagnosis not present

## 2019-11-02 DIAGNOSIS — M2142 Flat foot [pes planus] (acquired), left foot: Secondary | ICD-10-CM

## 2019-11-02 NOTE — Patient Instructions (Addendum)
Look for Urea cream 40% or stronger to apply to calluses daily with a pumice stone or pedi-egg Corns and Calluses Corns are small areas of thickened skin that occur on the top, sides, or tip of a toe. They contain a cone-shaped core with a point that can press on a nerve below. This causes pain.  Calluses are areas of thickened skin that can occur anywhere on the body, including the hands, fingers, palms, soles of the feet, and heels. Calluses are usually larger than corns. What are the causes? Corns and calluses are caused by rubbing (friction) or pressure, such as from shoes that are too tight or do not fit properly. What increases the risk? Corns are more likely to develop in people who have misshapen toes (toe deformities), such as hammer toes. Calluses can occur with friction to any area of the skin. They are more likely to develop in people who:  Work with their hands.  Wear shoes that fit poorly, are too tight, or are high-heeled.  Have toe deformities. What are the signs or symptoms? Symptoms of a corn or callus include:  A hard growth on the skin.  Pain or tenderness under the skin.  Redness and swelling.  Increased discomfort while wearing tight-fitting shoes, if your feet are affected. If a corn or callus becomes infected, symptoms may include:  Redness and swelling that gets worse.  Pain.  Fluid, blood, or pus draining from the corn or callus. How is this diagnosed? Corns and calluses may be diagnosed based on your symptoms, your medical history, and a physical exam. How is this treated? Treatment for corns and calluses may include:  Removing the cause of the friction or pressure. This may involve: ? Changing your shoes. ? Wearing shoe inserts (orthotics) or other protective layers in your shoes, such as a corn pad. ? Wearing gloves.  Applying medicine to the skin (topical medicine) to help soften skin in the hardened, thickened areas.  Removing layers of dead  skin with a file to reduce the size of the corn or callus.  Removing the corn or callus with a scalpel or laser.  Taking antibiotic medicines, if your corn or callus is infected.  Having surgery, if a toe deformity is the cause. Follow these instructions at home:   Take over-the-counter and prescription medicines only as told by your health care provider.  If you were prescribed an antibiotic, take it as told by your health care provider. Do not stop taking it even if your condition starts to improve.  Wear shoes that fit well. Avoid wearing high-heeled shoes and shoes that are too tight or too loose.  Wear any padding, protective layers, gloves, or orthotics as told by your health care provider.  Soak your hands or feet and then use a file or pumice stone to soften your corn or callus. Do this as told by your health care provider.  Check your corn or callus every day for symptoms of infection. Contact a health care provider if you:  Notice that your symptoms do not improve with treatment.  Have redness or swelling that gets worse.  Notice that your corn or callus becomes painful.  Have fluid, blood, or pus coming from your corn or callus.  Have new symptoms. Summary  Corns are small areas of thickened skin that occur on the top, sides, or tip of a toe.  Calluses are areas of thickened skin that can occur anywhere on the body, including the hands, fingers, palms,  and soles of the feet. Calluses are usually larger than corns.  Corns and calluses are caused by rubbing (friction) or pressure, such as from shoes that are too tight or do not fit properly.  Treatment may include wearing any padding, protective layers, gloves, or orthotics as told by your health care provider. This information is not intended to replace advice given to you by your health care provider. Make sure you discuss any questions you have with your health care provider. Document Revised: 05/21/2018 Document  Reviewed: 12/12/2016 Elsevier Patient Education  2020 [place] surgery is done to remove a bunion, which is a bony bump on the big toe joint, on the inner side of the foot. A bunion can develop over time when pressure turns the big toe and joint toward the other toes. Bunions may be caused by wearing shoes that are too tight or narrow. They can also be caused by conditions such as rheumatoid arthritis and flat feet. You may need bunion surgery if your bunion is very large or painful or it affects your ability to walk. Tell a health care provider about:  Any allergies you have.  All medicines you are taking, including vitamins, herbs, eye drops, creams, and over-the-counter medicines.  Any problems you or family members have had with anesthetic medicines.  Any blood disorders you have.  Any surgeries you have had.  Any medical conditions you have.  Whether you are pregnant or may be pregnant. What are the risks? Generally, this is a safe procedure. However, problems may occur, including:  Infection.  Bleeding or blood clots.  Allergic reactions to medicines.  Nerve damage.  Pain.  Numbness, stiffness, or arthritis in the toe.  Return of the bunion.  Failure of the bone to heal completely after surgery.  Failure of the surgery to relieve symptoms. What happens before the procedure? General instructions  Ask your health care provider about: ? Changing or stopping your regular medicines. This is especially important if you are taking diabetes medicines or blood thinners. ? Taking medicines such as aspirin and ibuprofen. These medicines can thin your blood. Do not take these medicines unless your health care provider tells you to take them. ? Taking over-the-counter medicines, vitamins, herbs, and supplements.  Do not drink alcohol before the procedure as told by your health care provider.  Do not use any products that contain nicotine or  tobacco, such as cigarettes and e-cigarettes. These can delay bone healing. If you need help quitting, ask your health care provider.  Plan to have someone take you home from the hospital or clinic.  Plan to have a responsible adult care for you for at least 24 hours after you leave the hospital or clinic. This is important.  Ask your health care provider how your surgical site will be marked or identified. Staying hydrated Follow instructions from your health care provider about hydration, which may include:  Up to 2 hours before the procedure - you may continue to drink clear liquids, such as water, clear fruit juice, black coffee, and plain tea. Eating and drinking restrictions Follow instructions from your health care provider about eating and drinking, which may include:  8 hours before the procedure - stop eating heavy meals or foods such as meat, fried foods, or fatty foods.  6 hours before the procedure - stop eating light meals or foods, such as toast or cereal.  6 hours before the procedure - stop drinking milk or drinks  that contain milk.  2 hours before the procedure - stop drinking clear liquids. What happens during the procedure?  To lower your risk of infection: ? Your health care team will wash or sanitize their hands. ? Your skin will be washed with soap.  An IV will be inserted into one of your veins.  You will be given one of the following: ? A medicine to numb the area (local anesthetic). ? A medicine to make you fall asleep (general anesthetic).  An incision will be made over the bump at the big toe joint. Depending on the type of procedure that is needed for your bunion, your surgeon may do one or more of the following: ? Remove the bunion (exostectomy). ? Cut or replace the damaged joint in your big toe (osteotomy). ? Use screws or other hardware to keep your foot in the correct position (arthrodesis). ? Tighten or loosen tissues around the big toe to  reposition the toe.  The incision will be closed with stitches (sutures) and covered with adhesive strips or another type of bandage (dressing).  A supportive device, such as a cast that you can walk on (boot), may be placed on your foot. The procedure may vary among health care providers and hospitals. What happens after the procedure?  Your blood pressure, heart rate, breathing rate, and blood oxygen level will be monitored until the medicines you were given have worn off.  Do not drive for 24 hours if you were given a sedative during your procedure.  Ask your health care provider when it is safe to drive if you have a boot or other supportive device on your foot.  You may be given instructions about how much body weight you can or cannot support on your foot (weight-bearing restrictions). You may be given crutches, a cane, or a walker to help you move around so that you do not put (bear) weight on your foot. Summary  Bunion surgery is done to remove a bunion, which is a bony bump on the big toe joint, on the inner side of the foot.  You may need bunion surgery if your bunion is very large or painful or it affects your ability to walk.  Before the procedure, follow instructions from your health care provider about eating and drinking, and ask about changing or stopping your regular medicines. This information is not intended to replace advice given to you by your health care provider. Make sure you discuss any questions you have with your health care provider. Document Revised: 01/11/2017 Document Reviewed: 11/05/2016 Elsevier Patient Education  2020 [employer].

## 2019-11-02 NOTE — Progress Notes (Signed)
  Subjective:  Patient ID: [name], female    DOB: [birth year],  MRN: [id number]  Chief Complaint  Patient presents with  . Foot Pain    callus to the bottom of left and right foot    59 y.o. female presents with the above complaint. History confirmed with patient.  She also complains of painful bunions bilaterally, but the left is worse than the right.  Objective:  Physical Exam: warm, good capillary refill, no trophic changes or ulcerative lesions, normal DP and PT pulses and normal sensory exam.  Bilaterally she has pes planus and moderate hallux valgus with good maintained range of motion of the first MTPJ.  There are hyperkeratoses submet 1, 5 on the left foot and medial hallux on the right foot    Assessment:   1. Hallux valgus with bunions, left   2. Hallux valgus with bunions, right   3. Pes planus of both feet   4. Callus of foot   5. Pain      Plan:  Patient was evaluated and treated and all questions answered.  All symptomatic hyperkeratoses were safely debrided with a sterile #15 blade to patient's level of comfort without incident. We discussed preventative and palliative care of these lesions including supportive and accommodative shoegear, padding, prefabricated and custom molded accommodative orthoses, use of a pumice stone and lotions/creams daily.  Discussed the etiology and treatment including surgical and non surgical treatment for painful bunions. She has exhausted all non surgical treatment prior to this visit including shoe gear changes and padding. She desires surgical intervention. We discussed all risks including but not limited to: pain, swelling, infection, scar, numbness which may be temporary or permanent, chronic pain, stiffness, nerve pain or damage, wound healing problems, bone healing problems including delayed or non-union and recurrence.  I discussed with her that with her current half pack per day smoking history that does not be  advisable and she is at high risk for wound complications and nonunion or delayed union of the osteotomy.  She states that she think she can quit smoking.  I will see her back in 3 months to see if she has been able to do this.  If she is able to quit then bunionectomy would be indicated.  Encouraged her to discuss this with her PCP.    Return in about 3 months (around 02/01/2020) for to re-check bunion on left foot and calluses.

## 2019-11-05 ENCOUNTER — Encounter: Payer: Self-pay | Admitting: Infectious Diseases

## 2019-12-07 ENCOUNTER — Ambulatory Visit (INDEPENDENT_AMBULATORY_CARE_PROVIDER_SITE_OTHER): Payer: 59

## 2019-12-07 ENCOUNTER — Other Ambulatory Visit: Payer: Self-pay

## 2019-12-07 DIAGNOSIS — Z23 Encounter for immunization: Secondary | ICD-10-CM | POA: Diagnosis not present

## 2019-12-07 NOTE — Progress Notes (Signed)
   Covid-19 Vaccination Clinic  Name:  CHAUNTEL WINDSOR    MRN: [id number] DOB: [birth year]  12/07/2019  Ms. Surges was observed post Covid-19 immunization for 15 minutes without incident. She was provided with Vaccine Information Sheet and instruction to access the V-Safe system.   Ms. Drenning was instructed to call 911 with any severe reactions post vaccine: Marland Kitchen Difficulty breathing  . Swelling of face and throat  . A fast heartbeat  . A bad rash all over body  . Dizziness and weakness     Beryle Flock, RN

## 2019-12-14 ENCOUNTER — Other Ambulatory Visit: Payer: Self-pay

## 2019-12-14 ENCOUNTER — Other Ambulatory Visit: Payer: Self-pay | Admitting: Pharmacist

## 2019-12-14 DIAGNOSIS — B20 Human immunodeficiency virus [HIV] disease: Secondary | ICD-10-CM

## 2019-12-14 MED ORDER — BIKTARVY 50-200-25 MG PO TABS: 1.0000 | ORAL_TABLET | Freq: Every day | ORAL | 5 refills | Status: DC

## 2019-12-22 ENCOUNTER — Telehealth: Payer: Self-pay | Admitting: Nurse Practitioner

## 2019-12-22 NOTE — Telephone Encounter (Signed)
Pt was called concerning AWV w/ [place]. Lvm to return call

## 2019-12-23 ENCOUNTER — Encounter: Payer: Self-pay | Admitting: Infectious Diseases

## 2020-01-28 ENCOUNTER — Encounter: Payer: Self-pay | Admitting: Pharmacist

## 2020-01-28 NOTE — Progress Notes (Signed)
Medication Samples have been provided to the patient.  Drug name: Biktarvy        Strength: 50/200/25 mg       Qty: 7 days; 1 bottle   LOT: [id number]   Exp.Date: 04/03/2022  Dosing instructions: Take one tablet by mouth once daily  The patient has been instructed regarding the correct time, dose, and frequency of taking this medication, including desired effects and most common side effects.   Jerris Fleer L. Eber Hong, PharmD, BCIDP, AAHIVP, CPP Clinical Pharmacist Practitioner [facility] 01/25/2020, 10:07 AM

## 2020-02-04 ENCOUNTER — Other Ambulatory Visit: Payer: Self-pay

## 2020-02-04 ENCOUNTER — Ambulatory Visit (INDEPENDENT_AMBULATORY_CARE_PROVIDER_SITE_OTHER): Payer: 59 | Admitting: Podiatry

## 2020-02-04 ENCOUNTER — Encounter: Payer: Self-pay | Admitting: Podiatry

## 2020-02-04 DIAGNOSIS — L84 Corns and callosities: Secondary | ICD-10-CM | POA: Diagnosis not present

## 2020-02-04 DIAGNOSIS — M2011 Hallux valgus (acquired), right foot: Secondary | ICD-10-CM | POA: Diagnosis not present

## 2020-02-04 DIAGNOSIS — M21611 Bunion of right foot: Secondary | ICD-10-CM | POA: Diagnosis not present

## 2020-02-08 NOTE — Progress Notes (Signed)
Subjective:   Patient ID: [name], female   DOB: 59 y.o.   MRN: [id number]   HPI Patient states I know I am getting need to get this bunion fixed on my left foot and I want to do it sometime early next year and I did my calluses cut and wanted to discuss   ROS      Objective:  Physical Exam  Neurovascular status intact with patient found to have chronic plantar keratotic lesion bilateral with thickness around the fifth metatarsal head plantar first metatarsal with a structural deformity of the left first metatarsal with deviation of the hallux against the second toe     Assessment:  Several different problems with 1 being chronic skin irritation with pressure and the other being structural bunion deformity left     Plan:  H&P reviewed conditions recommended correction.  Explained procedure to patient and did discussed distal osteotomy and correction.  Patient wants this done and at this time will have done in February and I educated her on this.  I debrided all lesions today which will need to be done on an ongoing basis

## 2020-02-15 ENCOUNTER — Ambulatory Visit: Payer: 59

## 2020-02-18 ENCOUNTER — Encounter: Payer: Self-pay | Admitting: Podiatry

## 2020-02-22 ENCOUNTER — Ambulatory Visit: Payer: 59 | Admitting: Nurse Practitioner

## 2020-02-29 ENCOUNTER — Ambulatory Visit (HOSPITAL_COMMUNITY)
Admission: RE | Admit: 2020-02-29 | Discharge: 2020-02-29 | Disposition: A | Discharge status: Home / Self Care | Payer: 59 | Source: Ambulatory Visit | Attending: Nurse Practitioner | Admitting: Nurse Practitioner

## 2020-02-29 ENCOUNTER — Encounter: Payer: Self-pay | Admitting: Nurse Practitioner

## 2020-02-29 ENCOUNTER — Telehealth (INDEPENDENT_AMBULATORY_CARE_PROVIDER_SITE_OTHER): Payer: 59 | Admitting: Nurse Practitioner

## 2020-02-29 ENCOUNTER — Other Ambulatory Visit: Payer: Self-pay

## 2020-02-29 VITALS — Ht 72.0 in | Wt 144.0 lb

## 2020-02-29 DIAGNOSIS — R1032 Left lower quadrant pain: Secondary | ICD-10-CM | POA: Insufficient documentation

## 2020-02-29 DIAGNOSIS — I10 Essential (primary) hypertension: Secondary | ICD-10-CM

## 2020-02-29 NOTE — Progress Notes (Signed)
   [place], [place]  [address] Phone:  [phone]   Fax:  [phone]  Virtual Visit via Telephone Note  I connected with [name] on 02/29/20 at  2:40 PM EST by video and verified that I am speaking with the correct person using two identifiers.   I discussed the limitations, risks, security and privacy concerns of performing an evaluation and management service by telephone and the availability of in person appointments. I also discussed with the patient that there may be a patient responsible charge related to this service. The patient expressed understanding and agreed to proceed.  Patient home Provider Office  History of Present Illness:   Abdominal Pain Patient complains of left  abdominal pain. The pain is described as aching, and is varies in intensity. The patient is experiencing LLQ pain without radiation. She admits that her diet. She had come collards greens a few days. Onset was 2 weeks ago. Symptoms have been stable. Aggravating factors: standing.  Alleviating factors: acetaminophen and belching. Associated symptoms: belching, flatus and heartburn. The patient denies anorexia, arthralagias, chills, diarrhea, dysuria, fever, frequency, headache, hematochezia, hematuria, melena, myalgias, nausea, sweats and vomiting. She admits that her bowel movements. She has added oatmeal to her diet. She has not had a bowel movement. She feels like it is better since she has been out of work. She admits that she eats a banana daily or QOD.   Hypertension Patient is here for follow-up of elevated blood pressure. She is not exercising but she works full-time.  and is adherent to a low-salt diet. Blood pressure is not monitored at home. Cardiac symptoms: none. Patient denies chest pain, dyspnea, fatigue, irregular heart beat, near-syncope and syncope. Cardiovascular risk factors: hypertension. Use of agents associated with hypertension:  none. History of target organ damage: none.   Observations/Objective: Virtual visit  Assessment and Plan: Assessment  Primary Diagnosis & Pertinent Problem List: The primary encounter diagnosis was Left lower quadrant abdominal pain. A diagnosis of Essential hypertension was also pertinent to this visit.  Visit Diagnosis: 1. Left lower quadrant abdominal pain  Abdominal xray pending [place] if the is negative Discussed diet   2. Essential hypertension  Encouraged home monitoring Refill as needed.  Follow up in the next few weeks for in person      Follow Up Instructions:    I discussed the assessment and treatment plan with the patient. The patient was provided an opportunity to ask questions and all were answered. The patient agreed with the plan and demonstrated an understanding of the instructions.   The patient was advised to call back or seek an in-person evaluation if the symptoms worsen or if the condition fails to improve as anticipated.  I provided 15 minutes of video- face-to-face time during this encounter.   Vevelyn Francois, NP

## 2020-02-29 NOTE — Patient Instructions (Signed)
Diverticulitis  Diverticulitis is when small pouches in your colon (large intestine) get infected or swollen. This causes pain in the belly (abdomen) and watery poop (diarrhea). These pouches are called diverticula. The pouches form in people who have a condition called diverticulosis. What are the causes? This condition may be caused by poop (stool) that gets trapped in the pouches in your colon. The poop lets germs (bacteria) grow in the pouches. This causes the infection. What increases the risk? You are more likely to get this condition if you have small pouches in your colon. The risk is higher if:  You are overweight or very overweight (obese).  You do not exercise enough.  You drink alcohol.  You smoke or use products with tobacco in them.  You eat a diet that has a lot of red meat such as beef, pork, or lamb.  You eat a diet that does not have enough fiber in it.  You are older than 60 years of age. What are the signs or symptoms?  Pain in the belly. Pain is often on the left side, but it may be in other areas.  Fever and feeling cold.  Feeling like you may vomit.  Vomiting.  Having cramps.  Feeling full.  Changes to how often you poop.  Blood in your poop. How is this treated? Most cases are treated at home by:  Taking over-the-counter pain medicines.  Following a clear liquid diet.  Taking antibiotic medicines.  Resting. Very bad cases may need to be treated at a hospital. This may include:  Not eating or drinking.  Taking prescription pain medicine.  Getting antibiotic medicines through an IV tube.  Getting fluid and food through an IV tube.  Having surgery. When you are feeling better, your doctor may tell you to have a test to check your colon (colonoscopy). Follow these instructions at home: Medicines  Take over-the-counter and prescription medicines only as told by your doctor. These include: ? Antibiotics. ? Pain medicines. ? Fiber  pills. ? Probiotics. ? Stool softeners.  If you were prescribed an antibiotic medicine, take it as told by your doctor. Do not stop taking the antibiotic even if you start to feel better.  Ask your doctor if the medicine prescribed to you requires you to avoid driving or using machinery. Eating and drinking  Follow a diet as told by your doctor.  When you feel better, your doctor may tell you to change your diet. You may need to eat a lot of fiber. Fiber makes it easier to poop (have a bowel movement). Foods with fiber include: ? Berries. ? Beans. ? Lentils. ? Green vegetables.  Avoid eating red meat.   General instructions  Do not use any products that contain nicotine or tobacco, such as cigarettes, e-cigarettes, and chewing tobacco. If you need help quitting, ask your doctor.  Exercise 3 or more times a week. Try to get 30 minutes each time. Exercise enough to sweat and make your heart beat faster.  Keep all follow-up visits as told by your doctor. This is important. Contact a doctor if:  Your pain does not get better.  You are not pooping like normal. Get help right away if:  Your pain gets worse.  Your symptoms do not get better.  Your symptoms get worse very fast.  You have a fever.  You vomit more than one time.  You have poop that is: ? Bloody. ? Black. ? Tarry. Summary  This condition happens when   small pouches in your colon get infected or swollen.  Take medicines only as told by your doctor.  Follow a diet as told by your doctor.  Keep all follow-up visits as told by your doctor. This is important. This information is not intended to replace advice given to you by your health care provider. Make sure you discuss any questions you have with your health care provider. Document Revised: 11/10/2018 Document Reviewed: 11/10/2018 Elsevier Patient Education  2021 [employer].   Abdominal Pain, Adult Many things can cause belly (abdominal) pain. Most  times, belly pain is not dangerous. Many cases of belly pain can be watched and treated at home. Sometimes, though, belly pain is serious. Your doctor will try to find the cause of your belly pain. Follow these instructions at home: Medicines  Take over-the-counter and prescription medicines only as told by your doctor.  Do not take medicines that help you poop (laxatives) unless told by your doctor. General instructions  Watch your belly pain for any changes.  Drink enough fluid to keep your pee (urine) pale yellow.  Keep all follow-up visits as told by your doctor. This is important.   Contact a doctor if:  Your belly pain changes or gets worse.  You are not hungry, or you lose weight without trying.  You are having trouble pooping (constipated) or have watery poop (diarrhea) for more than 2-3 days.  You have pain when you pee or poop.  Your belly pain wakes you up at night.  Your pain gets worse with meals, after eating, or with certain foods.  You are vomiting and cannot keep anything down.  You have a fever.  You have blood in your pee. Get help right away if:  Your pain does not go away as soon as your doctor says it should.  You cannot stop vomiting.  Your pain is only in areas of your belly, such as the right side or the left lower part of the belly.  You have bloody or black poop, or poop that looks like tar.  You have very bad pain, cramping, or bloating in your belly.  You have signs of not having enough fluid or water in your body (dehydration), such as: ? Dark pee, very little pee, or no pee. ? Cracked lips. ? Dry mouth. ? Sunken eyes. ? Sleepiness. ? Weakness.  You have trouble breathing or chest pain. Summary  Many cases of belly pain can be watched and treated at home.  Watch your belly pain for any changes.  Take over-the-counter and prescription medicines only as told by your doctor.  Contact a doctor if your belly pain changes or gets  worse.  Get help right away if you have very bad pain, cramping, or bloating in your belly. This information is not intended to replace advice given to you by your health care provider. Make sure you discuss any questions you have with your health care provider. Document Revised: 06/09/2018 Document Reviewed: 06/09/2018 Elsevier Patient Education  [place].

## 2020-03-14 ENCOUNTER — Encounter: Payer: Self-pay | Admitting: Nurse Practitioner

## 2020-03-14 ENCOUNTER — Ambulatory Visit (INDEPENDENT_AMBULATORY_CARE_PROVIDER_SITE_OTHER): Payer: 59 | Admitting: Nurse Practitioner

## 2020-03-14 ENCOUNTER — Other Ambulatory Visit: Payer: Self-pay

## 2020-03-14 VITALS — BP 110/78 | HR 78 | Temp 97.6°F | Ht 72.0 in | Wt 145.0 lb

## 2020-03-14 DIAGNOSIS — L905 Scar conditions and fibrosis of skin: Secondary | ICD-10-CM

## 2020-03-14 DIAGNOSIS — B20 Human immunodeficiency virus [HIV] disease: Secondary | ICD-10-CM

## 2020-03-14 DIAGNOSIS — I10 Essential (primary) hypertension: Secondary | ICD-10-CM | POA: Diagnosis not present

## 2020-03-14 NOTE — Progress Notes (Signed)
[place], [place]  [address] Phone:  [phone]   Fax:  [phone]   Established Patient Office Visit  Subjective:  Patient ID: [name], female    DOB: 26-Apr-1960  Age: 60 y.o. MRN: [id number]  CC:  Chief Complaint  Patient presents with  . Follow-up    Follow up , scar on back from bed sore for years     HPI [name] presents for follow up. She  has a past medical history of Anxiety (11/2018), Asthma, Hepatitis C, HIV (human immunodeficiency virus infection) ([place]), and Underweight (07/29/2014).   She admits that her stomach has improved.  She is planning to have surgery on foot on bunion .  Hypertension Patient is here for follow-up of elevated blood pressure. She is exercising and is adherent to a low-salt diet. Blood pressure is well controlled at home. Cardiac symptoms: none. Patient denies chest pain, dyspnea, fatigue, irregular heart beat, lower extremity edema and syncope. Cardiovascular risk factors: hypertension and smoking/ tobacco exposure. Use of agents associated with hypertension: none. History of target organ damage: none.   Hx of pneumonia 20 years ago and developed a bedsore while hospitalized for several weeks. She now has an area to her butttock that is causing her increased pain. She admits when this was the result of a bedsor. The pain seems to get worse with changes in her weight.   Past Medical History:  Diagnosis Date  . Anxiety 11/2018  . Asthma   . Hepatitis C   . HIV (human immunodeficiency virus infection) ([place])   . Underweight 07/29/2014    Past Surgical History:  Procedure Laterality Date  . COLONOSCOPY WITH PROPOFOL N/A 06/26/2016   Procedure: COLONOSCOPY WITH PROPOFOL;  Surgeon: Otis Brace, MD;  Location: [facility];  Service: Gastroenterology;  Laterality: N/A;  . LAPAROSCOPIC APPENDECTOMY N/A 07/20/2018   Procedure: APPENDECTOMY LAPAROSCOPIC;  Surgeon: Clovis Riley, MD;  Location: [facility] OR;  Service: General;  Laterality: N/A;  . PANCREATECTOMY      Family History  Problem Relation Age of Onset  . Cancer Mother        bone  . Diabetes Sister   . Colon cancer Maternal Uncle   . Esophageal cancer Neg Hx   . Gastric cancer Neg Hx   . Pancreatic cancer Neg Hx   . Liver disease Neg Hx   . Inflammatory bowel disease Neg Hx     Social History   Socioeconomic History  . Marital status: Single    Spouse name: Not on file  . Number of children: Not on file  . Years of education: Not on file  . Highest education level: Not on file  Occupational History  . Not on file  Tobacco Use  . Smoking status: Current Every Day Smoker    Packs/day: 0.50    Years: 33.00    Pack years: 16.50    Types: Cigarettes  . Smokeless tobacco: Never Used  . Tobacco comment: cutting back  Vaping Use  . Vaping Use: Never used  Substance and Sexual Activity  . Alcohol use: Yes    Alcohol/week: 5.0 standard drinks    Types: 5 Cans of beer per week  . 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 Sexual activity: Never    Comment: pt. given condoms  Other Topics Concern  . Not on file  Social History Narrative  . Not on  file   Social Determinants of Health   Financial Resource Strain: Not on file  Food Insecurity: Food Insecurity Present  . Worried About Charity fundraiser in the Last Year: Never true  . Ran Out of Food in the Last Year: Sometimes true  Transportation Needs: No Transportation Needs  . Lack of Transportation (Medical): No  . Lack of Transportation (Non-Medical): No  Physical Activity: Not on file  Stress: Not on file  Social Connections: Not on file  Intimate Partner Violence: Not on file    Outpatient Medications Prior to Visit  Medication Sig Dispense Refill  . acetaminophen (TYLENOL) 500 MG tablet Take 1 tablet (500 mg total) by mouth every 6 (six) hours as needed. (Patient taking differently: Take 1,000 mg by mouth every  6 (six) hours as needed for moderate pain (abdominal pain).) 30 tablet 0  . amLODipine (NORVASC) 5 MG tablet TAKE 1 TABLET(5 MG) BY MOUTH DAILY 90 tablet 3  . bictegravir-emtricitabine-tenofovir AF (BIKTARVY) 50-200-25 MG TABS tablet Take 1 tablet by mouth daily. 30 tablet 5  . Ensure (ENSURE) Take 1 Can by mouth 3 (three) times daily between meals. 240 mL 11  . fluticasone (FLONASE) 50 MCG/ACT nasal spray Place 2 sprays into both nostrils daily. 16 g 6  . sodium chloride (OCEAN) 0.65 % SOLN nasal spray Place 1 spray into both nostrils as needed for congestion. 480 mL 3   No facility-administered medications prior to visit.    Allergies  Allergen Reactions  . Aspirin Other (See Comments)    heart murmur    ROS Review of Systems  Skin:       Area of concern to her upper buttock area. She admits that this has been going on for > 20 years      Objective:    Physical Exam HENT:     Head: Normocephalic and atraumatic.  Cardiovascular:     Rate and Rhythm: Normal rate and regular rhythm.     Pulses: Normal pulses.     Heart sounds: Normal heart sounds.  Pulmonary:     Effort: Pulmonary effort is normal.     Breath sounds: Normal breath sounds.  Abdominal:     Palpations: Abdomen is soft.  Musculoskeletal:     Cervical back: Normal range of motion.  Skin:    General: Skin is warm.     Capillary Refill: Capillary refill takes less than 2 seconds.     Comments: Protrudding sacral scabbed over scar    Neurological:     General: No focal deficit present.     Mental Status: She is alert and oriented to person, place, and time.  Psychiatric:        Mood and Affect: Mood normal.        Behavior: Behavior normal.        Thought Content: Thought content normal.        Judgment: Judgment normal.     BP 110/78 (BP Location: Left Arm, Patient Position: Sitting, Cuff Size: Normal)   Pulse 78   Temp 97.6 F (36.4 C) (Temporal)   Ht 6' (1.829 m)   Wt 145 lb (65.8 kg)   LMP  03/27/2011   BMI 19.67 kg/m  Wt Readings from Last 3 Encounters:  03/14/20 145 lb (65.8 kg)  02/29/20 144 lb (65.3 kg)  10/22/19 150 lb (68 kg)     There are no preventive care reminders to display for this patient.  There are no preventive care reminders  to display for this patient.  Lab Results  Component Value Date   TSH 1.64 03/19/2016   Lab Results  Component Value Date   WBC 4.1 10/08/2019   HGB 12.4 10/08/2019   HCT 38.6 10/08/2019   MCV 91.7 10/08/2019   PLT 207 10/08/2019   Lab Results  Component Value Date   NA 138 10/08/2019   K 4.1 10/08/2019   CO2 28 10/08/2019   GLUCOSE 78 10/08/2019   BUN 15 10/08/2019   CREATININE 1.11 (H) 10/08/2019   BILITOT 0.3 10/08/2019   ALKPHOS 148 (H) 07/20/2019   AST 18 10/08/2019   ALT 11 10/08/2019   PROT 7.2 10/08/2019   ALBUMIN 4.6 07/20/2019   CALCIUM 9.6 10/08/2019   ANIONGAP 11 07/21/2018   Lab Results  Component Value Date   CHOL 159 10/08/2019   Lab Results  Component Value Date   HDL 54 10/08/2019   Lab Results  Component Value Date   LDLCALC 85 10/08/2019   Lab Results  Component Value Date   TRIG 108 10/08/2019   Lab Results  Component Value Date   CHOLHDL 2.9 10/08/2019   Lab Results  Component Value Date   HGBA1C 4.8 03/19/2016      Assessment & Plan:   Problem List Items Addressed This Visit      Cardiovascular and Mediastinum   Essential hypertension - Primary Stable on current regimen; Amlodipine     Other Visit Diagnoses    Skin scarring/fibrosis   Worsening referral to plastic surgeon for evaluation     Relevant Orders   Consult to plastic surgery   HIV disease ([place])   (Chronic)   Stable on current regimen , Bikktarvy      No orders of the defined types were placed in this encounter.   Follow-up: Return in about 6 months (around 09/11/2020) for Follow up HTN 26378.    Vevelyn Francois, NP

## 2020-03-14 NOTE — Patient Instructions (Signed)
Managing Your Hypertension Hypertension, also called high blood pressure, is when the force of the blood pressing against the walls of the arteries is too strong. Arteries are blood vessels that carry blood from your heart throughout your body. Hypertension forces the heart to work harder to pump blood and may cause the arteries to become narrow or stiff. Understanding blood pressure readings Your personal target blood pressure may vary depending on your medical conditions, your age, and other factors. A blood pressure reading includes a higher number over a lower number. Ideally, your blood pressure should be below 120/80. You should know that:  The first, or top, number is called the systolic pressure. It is a measure of the pressure in your arteries as your heart beats.  The second, or bottom number, is called the diastolic pressure. It is a measure of the pressure in your arteries as the heart relaxes. Blood pressure is classified into four stages. Based on your blood pressure reading, your health care provider may use the following stages to determine what type of treatment you need, if any. Systolic pressure and diastolic pressure are measured in a unit called mmHg. Normal  Systolic pressure: below 120.  Diastolic pressure: below 80. Elevated  Systolic pressure: 120-129.  Diastolic pressure: below 80. Hypertension stage 1  Systolic pressure: 130-139.  Diastolic pressure: 80-89. Hypertension stage 2  Systolic pressure: 140 or above.  Diastolic pressure: 90 or above. How can this condition affect me? Managing your hypertension is an important responsibility. Over time, hypertension can damage the arteries and decrease blood flow to important parts of the body, including the brain, heart, and kidneys. Having untreated or uncontrolled hypertension can lead to:  A heart attack.  A stroke.  A weakened blood vessel (aneurysm).  Heart failure.  Kidney damage.  Eye  damage.  Metabolic syndrome.  Memory and concentration problems.  Vascular dementia. What actions can I take to manage this condition? Hypertension can be managed by making lifestyle changes and possibly by taking medicines. Your health care provider will help you make a plan to bring your blood pressure within a normal range. Nutrition  Eat a diet that is high in fiber and potassium, and low in salt (sodium), added sugar, and fat. An example eating plan is called the Dietary Approaches to Stop Hypertension (DASH) diet. To eat this way: ? Eat plenty of fresh fruits and vegetables. Try to fill one-half of your plate at each meal with fruits and vegetables. ? Eat whole grains, such as whole-wheat pasta, brown rice, or whole-grain bread. Fill about one-fourth of your plate with whole grains. ? Eat low-fat dairy products. ? Avoid fatty cuts of meat, processed or cured meats, and poultry with skin. Fill about one-fourth of your plate with lean proteins such as fish, chicken without skin, beans, eggs, and tofu. ? Avoid pre-made and processed foods. These tend to be higher in sodium, added sugar, and fat.  Reduce your daily sodium intake. Most people with hypertension should eat less than 1,500 mg of sodium a day.   Lifestyle  Work with your health care provider to maintain a healthy body weight or to lose weight. Ask what an ideal weight is for you.  Get at least 30 minutes of exercise that causes your heart to beat faster (aerobic exercise) most days of the week. Activities may include walking, swimming, or biking.  Include exercise to strengthen your muscles (resistance exercise), such as weight lifting, as part of your weekly exercise routine. Try   to do these types of exercises for 30 minutes at least 3 days a week.  Do not use any products that contain nicotine or tobacco, such as cigarettes, e-cigarettes, and chewing tobacco. If you need help quitting, ask your health care  provider.  Control any long-term (chronic) conditions you have, such as high cholesterol or diabetes.  Identify your sources of stress and find ways to manage stress. This may include meditation, deep breathing, or making time for fun activities.   Alcohol use  Do not drink alcohol if: ? Your health care provider tells you not to drink. ? You are pregnant, may be pregnant, or are planning to become pregnant.  If you drink alcohol: ? Limit how much you use to:  0-1 drink a day for women.  0-2 drinks a day for men. ? Be aware of how much alcohol is in your drink. In the [place] one drink equals one 12 oz bottle of beer (355 mL), one 5 oz glass of wine (148 mL), or one 1 oz glass of hard liquor (44 mL). Medicines Your health care provider may prescribe medicine if lifestyle changes are not enough to get your blood pressure under control and if:  Your systolic blood pressure is 130 or higher.  Your diastolic blood pressure is 80 or higher. Take medicines only as told by your health care provider. Follow the directions carefully. Blood pressure medicines must be taken as told by your health care provider. The medicine does not work as well when you skip doses. Skipping doses also puts you at risk for problems. Monitoring Before you monitor your blood pressure:  Do not smoke, drink caffeinated beverages, or exercise within 30 minutes before taking a measurement.  Use the bathroom and empty your bladder (urinate).  Sit quietly for at least 5 minutes before taking measurements. Monitor your blood pressure at home as told by your health care provider. To do this:  Sit with your back straight and supported.  Place your feet flat on the floor. Do not cross your legs.  Support your arm on a flat surface, such as a table. Make sure your upper arm is at heart level.  Each time you measure, take two or three readings one minute apart and record the results. You may also need to have your  blood pressure checked regularly by your health care provider.   General information  Talk with your health care provider about your diet, exercise habits, and other lifestyle factors that may be contributing to hypertension.  Review all the medicines you take with your health care provider because there may be side effects or interactions.  Keep all visits as told by your health care provider. Your health care provider can help you create and adjust your plan for managing your high blood pressure. Where to find more information  National Heart, Lung, and Blood Institute: www.nhlbi.nih.gov  American Heart Association: www.heart.org Contact a health care provider if:  You think you are having a reaction to medicines you have taken.  You have repeated (recurrent) headaches.  You feel dizzy.  You have swelling in your ankles.  You have trouble with your vision. Get help right away if:  You develop a severe headache or confusion.  You have unusual weakness or numbness, or you feel faint.  You have severe pain in your chest or abdomen.  You vomit repeatedly.  You have trouble breathing. These symptoms may represent a serious problem that is an emergency. Do not wait   to see if the symptoms will go away. Get medical help right away. Call your local emergency services (911 in the [place] Do not drive yourself to the hospital. Summary  Hypertension is when the force of blood pumping through your arteries is too strong. If this condition is not controlled, it may put you at risk for serious complications.  Your personal target blood pressure may vary depending on your medical conditions, your age, and other factors. For most people, a normal blood pressure is less than 120/80.  Hypertension is managed by lifestyle changes, medicines, or both.  Lifestyle changes to help manage hypertension include losing weight, eating a healthy, low-sodium diet, exercising more, stopping smoking, and  limiting alcohol. This information is not intended to replace advice given to you by your health care provider. Make sure you discuss any questions you have with your health care provider. Document Revised: 03/06/2019 Document Reviewed: 12/30/2018 Elsevier Patient Education  2021 Elsevier Inc.  

## 2020-03-15 HISTORY — PX: BUNIONECTOMY: SHX129

## 2020-03-21 ENCOUNTER — Ambulatory Visit (INDEPENDENT_AMBULATORY_CARE_PROVIDER_SITE_OTHER): Payer: 59

## 2020-03-21 ENCOUNTER — Ambulatory Visit (INDEPENDENT_AMBULATORY_CARE_PROVIDER_SITE_OTHER): Payer: 59 | Admitting: Podiatry

## 2020-03-21 ENCOUNTER — Other Ambulatory Visit: Payer: Self-pay

## 2020-03-21 DIAGNOSIS — M2012 Hallux valgus (acquired), left foot: Secondary | ICD-10-CM

## 2020-03-21 DIAGNOSIS — M79672 Pain in left foot: Secondary | ICD-10-CM | POA: Diagnosis not present

## 2020-03-21 DIAGNOSIS — M21612 Bunion of left foot: Secondary | ICD-10-CM

## 2020-03-21 DIAGNOSIS — M21622 Bunionette of left foot: Secondary | ICD-10-CM | POA: Diagnosis not present

## 2020-03-21 NOTE — Progress Notes (Signed)
Subjective:   Patient ID: [name], female   DOB: 60 y.o.   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  I spent a great deal of time going over procedure surgery and recovery and patient wants surgery understanding risk.  Today I went ahead and after extensive review and going over alternative treatments patient signed consent form is given all preoperative instructions scheduled for outpatient surgery and had air fracture walker dispensed with all questions answered and usage discussed.  Understands total recovery can take 6 months and there is absolutely no long-term guarantees that the lesions will not recur

## 2020-03-23 ENCOUNTER — Telehealth: Payer: Self-pay

## 2020-03-23 NOTE — Telephone Encounter (Signed)
DOS 03/29/2020  [place] BUNIONECTOMY LT - 28296 METATARSAL OSTEOTOMY LT - 64403 EXC BENIGN LESION X 2 LT - 47425  [employer] FROM [facility] - APPROVED AUTH # [id number] FOR CPT [phone] GOOD FROM 03/29/2020 - 06/27/2020  CPT 88416 & 11426 DO NOT REQUIRE AUTH REF# [id number]

## 2020-03-28 MED ORDER — OXYCODONE-ACETAMINOPHEN 10-325 MG PO TABS: 1.0000 | ORAL_TABLET | ORAL | 0 refills | Status: DC | PRN

## 2020-03-28 MED ORDER — ONDANSETRON HCL 4 MG PO TABS: 4.0000 mg | ORAL_TABLET | Freq: Three times a day (TID) | ORAL | 0 refills | Status: DC | PRN

## 2020-03-28 NOTE — Addendum Note (Signed)
Addended by: Wallene Huh on: 03/28/2020 02:23 PM   Modules accepted: Orders

## 2020-03-29 ENCOUNTER — Encounter: Payer: Self-pay | Admitting: Podiatry

## 2020-03-29 DIAGNOSIS — D2372 Other benign neoplasm of skin of left lower limb, including hip: Secondary | ICD-10-CM

## 2020-03-29 DIAGNOSIS — M2012 Hallux valgus (acquired), left foot: Secondary | ICD-10-CM

## 2020-04-04 ENCOUNTER — Ambulatory Visit (INDEPENDENT_AMBULATORY_CARE_PROVIDER_SITE_OTHER): Payer: 59 | Admitting: Podiatry

## 2020-04-04 ENCOUNTER — Encounter: Payer: Self-pay | Admitting: Podiatry

## 2020-04-04 ENCOUNTER — Ambulatory Visit (INDEPENDENT_AMBULATORY_CARE_PROVIDER_SITE_OTHER): Payer: 59

## 2020-04-04 ENCOUNTER — Other Ambulatory Visit: Payer: Self-pay

## 2020-04-04 DIAGNOSIS — M21622 Bunionette of left foot: Secondary | ICD-10-CM

## 2020-04-04 DIAGNOSIS — M21612 Bunion of left foot: Secondary | ICD-10-CM

## 2020-04-04 DIAGNOSIS — M2012 Hallux valgus (acquired), left foot: Secondary | ICD-10-CM | POA: Diagnosis not present

## 2020-04-05 NOTE — Progress Notes (Signed)
Subjective:   Patient ID: [name], female   DOB: 60 y.o.   MRN: [id number]   HPI Patient states doing very well with surgery very pleased so far   ROS      Objective:  Physical Exam  Neurovascular status intact negative Bevelyn Buckles' sign noted wound edges first fifth metatarsal healing well and are plantar incisions wound edges coapted well stitches in place     Assessment:  Doing well multiple foot surgery left     Plan:  H&P x-rays reviewed sterile dressings reapplied continue elevation compression immobilization reappoint 2 weeks suture removal earlier if needed  X-rays indicate osteotomies healing well fixation in place good alignment noted

## 2020-04-18 ENCOUNTER — Other Ambulatory Visit: Payer: Self-pay

## 2020-04-18 ENCOUNTER — Encounter: Payer: Self-pay | Admitting: Podiatry

## 2020-04-18 ENCOUNTER — Ambulatory Visit (INDEPENDENT_AMBULATORY_CARE_PROVIDER_SITE_OTHER): Payer: 59

## 2020-04-18 ENCOUNTER — Ambulatory Visit (INDEPENDENT_AMBULATORY_CARE_PROVIDER_SITE_OTHER): Payer: 59 | Admitting: Podiatry

## 2020-04-18 DIAGNOSIS — M79672 Pain in left foot: Secondary | ICD-10-CM

## 2020-04-18 DIAGNOSIS — M21622 Bunionette of left foot: Secondary | ICD-10-CM

## 2020-04-18 NOTE — Progress Notes (Signed)
Subjective:   Patient ID: [name], female   DOB: 60 y.o.   MRN: [id number]   HPI Patient states doing very well with surgery and very pleased so far with recovery   ROS      Objective:  Physical Exam  Neurovascular status found to be intact wound edges well coapted hallux in good alignment good range of motion no crepitus of the joint     Assessment:  Doing well post osteotomy first metatarsal left and plantar skin resections and fifth metatarsal head resection     Plan:  H&P reviewed all conditions remove stitches all wound edges coapted well and continue with open toed shoe surgical shoe usage gradual return to soft shoes over the next few weeks  X-ray indicates excellent healing of the osteotomy good alignment noted joint congruence

## 2020-05-16 ENCOUNTER — Other Ambulatory Visit: Payer: Self-pay

## 2020-05-16 ENCOUNTER — Ambulatory Visit (INDEPENDENT_AMBULATORY_CARE_PROVIDER_SITE_OTHER): Payer: 59

## 2020-05-16 DIAGNOSIS — M21622 Bunionette of left foot: Secondary | ICD-10-CM | POA: Diagnosis not present

## 2020-05-16 DIAGNOSIS — B351 Tinea unguium: Secondary | ICD-10-CM

## 2020-05-16 NOTE — Progress Notes (Signed)
Patient presents today for POV #2. Patient reports she is doing well at this time. X-Ray taken today of left foot. Patient has been able to transition into a soft supportive shoe but reports some soreness on the bottom of the foot near the ball of the hallux. Patient give a gel pad to help relief the pressure. Patient denies nausea, vomiting, fever, chills and pain at this time. Advised to call the office with any questions, comments, or concerns. Patient verbalized understanding.

## 2020-05-30 ENCOUNTER — Other Ambulatory Visit: Payer: Self-pay

## 2020-05-30 ENCOUNTER — Ambulatory Visit (INDEPENDENT_AMBULATORY_CARE_PROVIDER_SITE_OTHER): Payer: 59 | Admitting: Podiatry

## 2020-05-30 ENCOUNTER — Encounter: Payer: Self-pay | Admitting: Podiatry

## 2020-05-30 ENCOUNTER — Ambulatory Visit (INDEPENDENT_AMBULATORY_CARE_PROVIDER_SITE_OTHER): Payer: 59

## 2020-05-30 DIAGNOSIS — M21622 Bunionette of left foot: Secondary | ICD-10-CM | POA: Diagnosis not present

## 2020-05-30 NOTE — Progress Notes (Signed)
Subjective:   Patient ID: [name], female   DOB: 60 y.o.   MRN: [id number]   HPI Patient states she is doing well with her left foot and states that she still feels some pain on the bottom when she walks but overall improved   ROS      Objective:  Physical Exam  Neurovascular status intact with patient's left foot healing well after having osteotomy metatarsal head resection and removal of plantar lesions     Assessment:  Doing well post surgical correction left foot     Plan:  H&P x-ray reviewed explained is normal to still have some discomfort but I am satisfied with how she is doing I am very hopeful these lesions will not recur and I gave her instructions for elevation to be done at times gradual increase in activity and shoe gear usage and will be seen back on an as-needed basis as symptoms indicate  X-rays indicate that there is good healing of the osteotomy fixation in place good reduction of the ankle

## 2020-07-07 ENCOUNTER — Other Ambulatory Visit: Payer: Self-pay

## 2020-07-07 ENCOUNTER — Other Ambulatory Visit: Payer: 59

## 2020-07-07 DIAGNOSIS — Z113 Encounter for screening for infections with a predominantly sexual mode of transmission: Secondary | ICD-10-CM

## 2020-07-07 DIAGNOSIS — B20 Human immunodeficiency virus [HIV] disease: Secondary | ICD-10-CM

## 2020-07-07 DIAGNOSIS — Z79899 Other long term (current) drug therapy: Secondary | ICD-10-CM

## 2020-07-08 LAB — T-HELPER CELL (CD4) - (RCID CLINIC ONLY)
CD4 % Helper T Cell: 30 % — ABNORMAL LOW (ref 33–65)
CD4 T Cell Abs: 444 /uL (ref 400–1790)

## 2020-07-12 ENCOUNTER — Telehealth: Payer: Self-pay

## 2020-07-12 LAB — COMPREHENSIVE METABOLIC PANEL
AG Ratio: 1.6 (calc) (ref 1.0–2.5)
ALT: 8 U/L (ref 6–29)
AST: 16 U/L (ref 10–35)
Albumin: 4.4 g/dL (ref 3.6–5.1)
Alkaline phosphatase (APISO): 135 U/L (ref 37–153)
BUN/Creatinine Ratio: 14 (calc) (ref 6–22)
BUN: 16 mg/dL (ref 7–25)
CO2: 27 mmol/L (ref 20–32)
Calcium: 9.4 mg/dL (ref 8.6–10.4)
Chloride: 105 mmol/L (ref 98–110)
Creat: 1.12 mg/dL — ABNORMAL HIGH (ref 0.50–1.05)
Globulin: 2.8 g/dL (calc) (ref 1.9–3.7)
Glucose, Bld: 81 mg/dL (ref 65–99)
Potassium: 4.1 mmol/L (ref 3.5–5.3)
Sodium: 140 mmol/L (ref 135–146)
Total Bilirubin: 0.3 mg/dL (ref 0.2–1.2)
Total Protein: 7.2 g/dL (ref 6.1–8.1)

## 2020-07-12 LAB — LIPID PANEL
Cholesterol: 168 mg/dL (ref <200)
HDL: 69 mg/dL (ref >=50)
LDL Cholesterol (Calc): 81 mg/dL (calc)
Non-HDL Cholesterol (Calc): 99 mg/dL (calc) (ref <130)
Total CHOL/HDL Ratio: 2.4 (calc) (ref <5.0)
Triglycerides: 98 mg/dL (ref <150)

## 2020-07-12 LAB — CBC
HCT: 39.6 % (ref 35.0–45.0)
Hemoglobin: 13 g/dL (ref 11.7–15.5)
MCH: 31 pg (ref 27.0–33.0)
MCHC: 32.8 g/dL (ref 32.0–36.0)
MCV: 94.3 fL (ref 80.0–100.0)
MPV: 10.5 fL (ref 7.5–12.5)
Platelets: 199 10*3/uL (ref 140–400)
RBC: 4.2 10*6/uL (ref 3.80–5.10)
RDW: 11.9 % (ref 11.0–15.0)
WBC: 3.8 10*3/uL (ref 3.8–10.8)

## 2020-07-12 LAB — HIV-1 RNA QUANT-NO REFLEX-BLD
HIV 1 RNA Quant: NOT DETECTED Copies/mL
HIV-1 RNA Quant, Log: NOT DETECTED Log cps/mL

## 2020-07-12 LAB — FLUORESCENT TREPONEMAL AB(FTA)-IGG-BLD: Fluorescent Treponemal ABS: NONREACTIVE

## 2020-07-12 LAB — RPR TITER: RPR Titer: 1:1 {titer} — ABNORMAL HIGH

## 2020-07-12 LAB — RPR: RPR Ser Ql: REACTIVE — AB

## 2020-07-12 NOTE — Telephone Encounter (Signed)
Med refill  Bp medicine to be refilled.

## 2020-07-13 ENCOUNTER — Other Ambulatory Visit: Payer: Self-pay | Admitting: Nurse Practitioner

## 2020-07-13 DIAGNOSIS — I10 Essential (primary) hypertension: Secondary | ICD-10-CM

## 2020-07-13 MED ORDER — AMLODIPINE BESYLATE 5 MG PO TABS: ORAL_TABLET | ORAL | 3 refills | Status: DC

## 2020-07-13 NOTE — Telephone Encounter (Signed)
Medication refilled

## 2020-07-21 ENCOUNTER — Encounter: Payer: Self-pay | Admitting: Infectious Diseases

## 2020-07-21 ENCOUNTER — Other Ambulatory Visit: Payer: Self-pay | Admitting: Infectious Diseases

## 2020-07-21 ENCOUNTER — Other Ambulatory Visit: Payer: Self-pay

## 2020-07-21 ENCOUNTER — Ambulatory Visit: Payer: 59 | Admitting: Infectious Diseases

## 2020-07-21 VITALS — BP 118/82 | HR 73 | Wt 144.8 lb

## 2020-07-21 DIAGNOSIS — Z113 Encounter for screening for infections with a predominantly sexual mode of transmission: Secondary | ICD-10-CM | POA: Diagnosis not present

## 2020-07-21 DIAGNOSIS — F17200 Nicotine dependence, unspecified, uncomplicated: Secondary | ICD-10-CM | POA: Diagnosis not present

## 2020-07-21 DIAGNOSIS — I10 Essential (primary) hypertension: Secondary | ICD-10-CM | POA: Diagnosis not present

## 2020-07-21 DIAGNOSIS — Z79899 Other long term (current) drug therapy: Secondary | ICD-10-CM

## 2020-07-21 DIAGNOSIS — B20 Human immunodeficiency virus [HIV] disease: Secondary | ICD-10-CM

## 2020-07-21 DIAGNOSIS — N814 Uterovaginal prolapse, unspecified: Secondary | ICD-10-CM

## 2020-07-21 NOTE — Assessment & Plan Note (Signed)
Encouraged to quit. 

## 2020-07-21 NOTE — Assessment & Plan Note (Signed)
Well controlled on calcium channel blocker.  Will continue to watch.

## 2020-07-21 NOTE — Progress Notes (Signed)
   Subjective:    Patient ID: [name], female  DOB: January 04, 1961, 60 y.o.        MRN: [id number]   HPI 60 yo F with HIV+ and cured Hep C (u/s 2009 no elastogram) and tobacco use. Has been on Odefsy, prior was on atripla (for convenience) since 2007 after having been successfully maintained on Nelfinavir for many years.   She was changed to biktarvy 11-2018 due to protonix.  She had colon 06-2016 (single tubular adenoma, hemorrhoids). Next set at 10 yrs.    Has noted that her hair has been coming out.  No problems with her biktarvy.  She had foot surgery (bunion removed) 03-29-20.  Has noted a "ball" sticking out of her vagina.   HIV 1 RNA Quant  Date Value  07/07/2020 Not Detected Copies/mL  10/08/2019 27 Copies/mL (H)  12/17/2018 <20 NOT DETECTED copies/mL   CD4 T Cell Abs (/uL)  Date Value  07/07/2020 444  10/08/2019 419  12/17/2018 479     Health Maintenance  Topic Date Due  . Pneumococcal Vaccine 60-1 Years old (1 - PCV) Never done  . Zoster Vaccines- Shingrix (1 of 2) Never done  . PAP SMEAR-Modifier  08/07/2018  . COVID-19 Vaccine (4 - Booster for [place] series) 03/08/2020  . INFLUENZA VACCINE  09/12/2020  . MAMMOGRAM  03/10/2021  . TETANUS/TDAP  06/20/2026  . COLONOSCOPY (Pts 45-21yrs Insurance coverage will need to be confirmed)  06/27/2026  . Hepatitis C Screening  Completed  . HIV Screening  Completed  . HPV VACCINES  Aged Out      Review of Systems  Constitutional:  Negative for chills, fever and weight loss.  Respiratory:  Negative for cough and shortness of breath.   Gastrointestinal:  Positive for constipation. Negative for diarrhea.  Genitourinary:  Negative for dysuria.   Please see HPI. All other systems reviewed and negative.     Objective:  Physical Exam Vitals reviewed.  Constitutional:      Appearance: Normal appearance. She is not ill-appearing or diaphoretic.  HENT:     Mouth/Throat:     Mouth: Mucous membranes are moist.      Pharynx: No oropharyngeal exudate.  Cardiovascular:     Rate and Rhythm: Normal rate and regular rhythm.  Pulmonary:     Effort: Pulmonary effort is normal.     Breath sounds: Normal breath sounds.  Abdominal:     General: Bowel sounds are normal. There is no distension.     Palpations: Abdomen is soft.     Tenderness: There is no abdominal tenderness.  Musculoskeletal:     Cervical back: Normal range of motion and neck supple.     Right lower leg: No edema.     Left lower leg: No edema.  Neurological:     General: No focal deficit present.     Mental Status: She is alert.  Psychiatric:        Mood and Affect: Mood normal.          Assessment & Plan:

## 2020-07-21 NOTE — Assessment & Plan Note (Signed)
She is doing well We reviewed her labs She will rtc in 9 months with labs prior. Offered/refused condoms.

## 2020-07-21 NOTE — Assessment & Plan Note (Signed)
Her hx of feeling something "poking out of her vagina" is very consistent with prolapse. She states she has been seen for this prior and was told to do exercises, this did not help.  Will send her to GYN.

## 2020-07-26 ENCOUNTER — Other Ambulatory Visit: Payer: Self-pay | Admitting: Infectious Diseases

## 2020-07-26 DIAGNOSIS — B20 Human immunodeficiency virus [HIV] disease: Secondary | ICD-10-CM

## 2020-08-02 ENCOUNTER — Ambulatory Visit (INDEPENDENT_AMBULATORY_CARE_PROVIDER_SITE_OTHER): Payer: 59 | Admitting: Obstetrics and Gynecology

## 2020-08-02 ENCOUNTER — Encounter: Payer: Self-pay | Admitting: Obstetrics and Gynecology

## 2020-08-02 ENCOUNTER — Other Ambulatory Visit: Payer: Self-pay

## 2020-08-02 VITALS — BP 100/72 | HR 70 | Resp 14 | Ht 70.5 in | Wt 143.0 lb

## 2020-08-02 DIAGNOSIS — N8110 Cystocele, unspecified: Secondary | ICD-10-CM | POA: Diagnosis not present

## 2020-08-02 DIAGNOSIS — R32 Unspecified urinary incontinence: Secondary | ICD-10-CM | POA: Diagnosis not present

## 2020-08-02 NOTE — Progress Notes (Signed)
GYNECOLOGY  VISIT   HPI: 60 y.o.   Single  African American  female   [phone] with Patient's last menstrual period was 03/27/2011.   here for uterine prolapse for over one year.  Symptoms are progressive.   Feels a ball at the bottom of her vagina.  She has seen a gynecologist in the past but the prolapse was not reproduced.  She did not do recommended pelvic floor therapy.   Has some urgency to void with leakage of urine.  No pad use.   No leak with cough, laugh or sneeze now.  She did have this occur in the past.   She can need to double void.   Has constipation.  Sometimes no BM for 2 weeks.  Seen by GI.  Had colonoscopy, which was normal.  Linzess worked, but it is too expensive for her.  No leakage of stool.   Not sexually active.   2 vagina deliveries, largest 6 1/2 pounds.   She stands at work and folds clothes.   GYNECOLOGIC HISTORY: Patient's last menstrual period was 03/27/2011. Contraception: Abstinence Menopausal hormone therapy:  none Last mammogram:  03-11-19 3D/Neg/Birads1 Last pap smear: 08-06-17 Neg:Neg HR HPV, 07-02-12 Neg:Neg HR HPV, 04-11-11 Neg.  Paps always normal        OB History     Gravida  4   Para  2   Term  2   Preterm      AB  2   Living  2      SAB      IAB  2   Ectopic      Multiple      Live Births                 Patient Active Problem List   Diagnosis Date Noted   Uterine prolapse 07/21/2020   Sinusitis 03/02/2019   Callus of foot 12/12/2016   Multiple renal cysts 08/14/2016   Constipation 11/30/2014   Essential hypertension 11/30/2014   Hot flashes, menopausal 01/27/2014   Asthma 01/23/2011   HEADACHE 04/14/2008   CHOLELITHIASIS 12/15/2007   PERIPHERAL NEUROPATHY 11/07/2007   Avulsion fracture of tooth 07/23/2007   Depression 07/24/2006   Human immunodeficiency virus (HIV) disease ([place]) 12/14/2005   MACROCYTIC ANEMIA 12/14/2005   TOBACCO ABUSE 12/14/2005   SUBSTANCE ABUSE 12/14/2005   GERD  12/14/2005   PNEUMONIA, HX OF 12/14/2005   COLPOSCOPY, HX OF 12/14/2005    Past Medical History:  Diagnosis Date   Anxiety 11/2018   Asthma    Hepatitis C    HIV (human immunodeficiency virus infection) ([place])    Hypertension    STD (sexually transmitted disease)    Substance abuse ([place])    Underweight 07/29/2014    Past Surgical History:  Procedure Laterality Date   BUNIONECTOMY Left 03/15/2020   COLONOSCOPY WITH PROPOFOL N/A 06/26/2016   Procedure: COLONOSCOPY WITH PROPOFOL;  Surgeon: Otis Brace, MD;  Location: [facility];  Service: Gastroenterology;  Laterality: N/A;   fractured wrist Left    LAPAROSCOPIC APPENDECTOMY N/A 07/20/2018   Procedure: APPENDECTOMY LAPAROSCOPIC;  Surgeon: Clovis Riley, MD;  Location: [facility];  Service: General;  Laterality: N/A;   PANCREATECTOMY      Current Outpatient Medications  Medication Sig Dispense Refill   acetaminophen (TYLENOL) 500 MG tablet Take 1 tablet (500 mg total) by mouth every 6 (six) hours as needed. (Patient taking differently: Take 1,000 mg by mouth every 6 (six) hours as needed for moderate pain (abdominal  pain).) 30 tablet 0   amLODipine (NORVASC) 5 MG tablet TAKE 1 TABLET(5 MG) BY MOUTH DAILY 90 tablet 3   BIKTARVY 50-200-25 MG TABS tablet TAKE 1 TABLET BY MOUTH DAILY 30 tablet 5   Ensure (ENSURE) Take 1 Can by mouth 3 (three) times daily between meals. 240 mL 11   fluticasone (FLONASE) 50 MCG/ACT nasal spray Place 2 sprays into both nostrils daily. 16 g 6   oxyCODONE-acetaminophen (PERCOCET) 10-325 MG tablet Take 1 tablet by mouth every 4 (four) hours as needed for pain. 30 tablet 0   sodium chloride (OCEAN) 0.65 % SOLN nasal spray Place 1 spray into both nostrils as needed for congestion. 480 mL 3   No current facility-administered medications for this visit.     ALLERGIES: Aspirin  Family History  Problem Relation Age of Onset   Cancer Mother        bone   Diabetes Sister    Colon cancer Maternal Uncle     Esophageal cancer Neg Hx    Gastric cancer Neg Hx    Pancreatic cancer Neg Hx    Liver disease Neg Hx    Inflammatory bowel disease Neg Hx     Social History   Socioeconomic History   Marital status: Single    Spouse name: Not on file   Number of children: Not on file   Years of education: Not on file   Highest education level: Not on file  Occupational History   Not on file  Tobacco Use   Smoking status: Every Day    Packs/day: 0.50    Years: 33.00    Pack years: 16.50    Types: Cigarettes   Smokeless tobacco: Never   Tobacco comments:    cutting back  Vaping Use   Vaping Use: Never used  Substance and Sexual Activity   Alcohol use: Yes    Alcohol/week: 5.0 standard drinks    Types: 5 Cans of beer per week   Drug use: No    Comment: clean for 10 years   Sexual activity: Not Currently    Birth control/protection: Post-menopausal    Comment: pt. given condoms  Other Topics Concern   Not on file  Social History Narrative   Not on file   Social Determinants of Health   Financial Resource Strain: Not on file  Food Insecurity: Not on file  Transportation Needs: Not on file  Physical Activity: Not on file  Stress: Not on file  Social Connections: Not on file  Intimate Partner Violence: Not on file    Review of Systems  All other systems reviewed and are negative.  PHYSICAL EXAMINATION:    BP 100/72   Pulse 70   Resp 14   Ht 5' 10.5" (1.791 m)   Wt 143 lb (64.9 kg)   LMP 03/27/2011   BMI 20.23 kg/m     General appearance: alert, cooperative and appears stated age Head: Normocephalic, without obvious abnormality, atraumatic Neck: no adenopathy, supple, symmetrical, trachea midline and thyroid normal to inspection and palpation Lungs: clear to auscultation bilaterally Heart: regular rate and rhythm Abdomen: soft, non-tender, no masses,  no organomegaly Extremities: extremities normal, atraumatic, no cyanosis or edema Skin: Skin color, texture,  turgor normal. No rashes or lesions Lymph nodes: Cervical, supraclavicular nodes are normal.  No abnormal inguinal nodes palpated Neurologic: Grossly normal  Pelvic: External genitalia:  no lesions              Urethra:  normal appearing  urethra with no masses, tenderness or lesions              Bartholins and Skenes: normal                 Vagina: normal appearing vagina with normal color and discharge, no lesions.  Almost second degree cystocele.  Good uterine and posterior vaginal wall support.               Cervix: no lesions                Bimanual Exam:  Uterus:  normal size, contour, position, consistency, mobility, non-tender              Adnexa: no mass, fullness, tenderness              Rectal exam: Yes.  .  Confirms.              Anus:  normal sphincter tone, no lesions  Chaperone was present for exam.  ASSESSMENT  Cystocele.  Urinary incontinence.   Chronic constipation.  HIV.  Levels undetectable.  Status post pancreatectomy.   PLAN  We discussed pelvic organ prolapse and urinary incontinence.  Risk factors reviewed - childbirth, chronic straining, lifting.  We discussed treatment options - pelvic floor therapy, pessary, and surgical correction.  Patient prefers to pursue surgical treatment.  She will return for urodynamic testing.  Rationale of procedure explained.  She will need a pessary for reduction of the prolapse with her testing.   Final surgical plan to follow.

## 2020-08-02 NOTE — Patient Instructions (Signed)

## 2020-08-29 ENCOUNTER — Ambulatory Visit: Payer: 59

## 2020-08-29 ENCOUNTER — Other Ambulatory Visit: Payer: Self-pay

## 2020-09-05 ENCOUNTER — Ambulatory Visit (INDEPENDENT_AMBULATORY_CARE_PROVIDER_SITE_OTHER): Payer: 59 | Admitting: Nurse Practitioner

## 2020-09-05 ENCOUNTER — Other Ambulatory Visit: Payer: Self-pay

## 2020-09-05 VITALS — BP 148/84 | HR 70 | Temp 98.6°F | Ht 72.0 in | Wt 140.0 lb

## 2020-09-05 DIAGNOSIS — R634 Abnormal weight loss: Secondary | ICD-10-CM

## 2020-09-05 DIAGNOSIS — I10 Essential (primary) hypertension: Secondary | ICD-10-CM

## 2020-09-05 DIAGNOSIS — F32A Depression, unspecified: Secondary | ICD-10-CM

## 2020-09-05 DIAGNOSIS — L905 Scar conditions and fibrosis of skin: Secondary | ICD-10-CM | POA: Diagnosis not present

## 2020-09-05 DIAGNOSIS — B20 Human immunodeficiency virus [HIV] disease: Secondary | ICD-10-CM

## 2020-09-05 MED ORDER — AMOXICILLIN-POT CLAVULANATE 875-125 MG PO TABS: 1.0000 | ORAL_TABLET | Freq: Two times a day (BID) | ORAL | 0 refills | Status: AC

## 2020-09-05 MED ORDER — MIRTAZAPINE 45 MG PO TBDP
45.0000 mg | ORAL_TABLET | Freq: Every day | ORAL | 3 refills | Status: DC
Start: 2020-09-05 — End: 2021-11-16

## 2020-09-05 NOTE — Progress Notes (Signed)
Thank you for   [facility] [place], [place]  [address] Phone:  [phone]   Fax:  [phone]   Established Patient Office Visit  Subjective:  Patient ID: [name], female    DOB: Nov 21, 1960  Age: 60 y.o. MRN: [id number]  CC:  Chief Complaint  Patient presents with   Follow-up    Weight loss, abdominal pain    HPI [name] presents for follow up. She  has a past medical history of Anxiety (11/2018), Asthma, Hepatitis C, HIV (human immunodeficiency virus infection) ([name]), Hypertension, STD (sexually transmitted disease), Substance abuse ([place]), and Underweight (07/29/2014).    She is concern about her weight loss. She is currently working 2 jobs.  She reports her appetite is not always the best.  She is no longer drinking Ensure. Denies headache, dizziness, visual changes, shortness of breath, dyspnea on exertion, chest pain, nausea, vomiting or any edema.     Past Medical History:  Diagnosis Date   Anxiety 11/2018   Asthma    Hepatitis C    HIV (human immunodeficiency virus infection) ([name])    Hypertension    STD (sexually transmitted disease)    Substance abuse ([name])    Underweight 07/29/2014    Past Surgical History:  Procedure Laterality Date   BUNIONECTOMY Left 03/15/2020   COLONOSCOPY WITH PROPOFOL N/A 06/26/2016   Procedure: COLONOSCOPY WITH PROPOFOL;  Surgeon: Otis Brace, MD;  Location: [facility];  Service: Gastroenterology;  Laterality: N/A;   fractured wrist Left    LAPAROSCOPIC APPENDECTOMY N/A 07/20/2018   Procedure: APPENDECTOMY LAPAROSCOPIC;  Surgeon: Clovis Riley, MD;  Location: [facility];  Service: General;  Laterality: N/A;   PANCREATECTOMY      Family History  Problem Relation Age of Onset   Cancer Mother        bone   Diabetes Sister    Colon cancer Maternal Uncle    Esophageal cancer Neg Hx    Gastric cancer Neg Hx    Pancreatic cancer Neg Hx    Liver disease Neg Hx     Inflammatory bowel disease Neg Hx     Social History   Socioeconomic History   Marital status: Single    Spouse name: Not on file   Number of children: Not on file   Years of education: Not on file   Highest education level: Not on file  Occupational History   Not on file  Tobacco Use   Smoking status: Every Day    Packs/day: 0.50    Years: 33.00    Pack years: 16.50    Types: Cigarettes   Smokeless tobacco: Never   Tobacco comments:    cutting back  Vaping Use   Vaping Use: Never used  Substance and Sexual Activity   Alcohol use: Yes    Alcohol/week: 5.0 standard drinks    Types: 5 Cans of beer per week   Drug use: No    Comment: clean for 10 years   Sexual activity: Not Currently    Birth control/protection: Post-menopausal    Comment: pt. given condoms  Other Topics Concern   Not on file  Social History Narrative   Not on file   Social Determinants of Health   Financial Resource Strain: Not on file  Food Insecurity: Not on file  Transportation Needs: Not on file  Physical Activity: Not on file  Stress: Not on file  Social Connections: Not on file  Intimate Partner  Violence: Not on file    Outpatient Medications Prior to Visit  Medication Sig Dispense Refill   acetaminophen (TYLENOL) 500 MG tablet Take 1 tablet (500 mg total) by mouth every 6 (six) hours as needed. (Patient taking differently: Take 1,000 mg by mouth every 6 (six) hours as needed for moderate pain (abdominal pain).) 30 tablet 0   amLODipine (NORVASC) 5 MG tablet TAKE 1 TABLET(5 MG) BY MOUTH DAILY 90 tablet 3   BIKTARVY 50-200-25 MG TABS tablet TAKE 1 TABLET BY MOUTH DAILY 30 tablet 5   Ensure (ENSURE) Take 1 Can by mouth 3 (three) times daily between meals. 240 mL 11   fluticasone (FLONASE) 50 MCG/ACT nasal spray Place 2 sprays into both nostrils daily. 16 g 6   sodium chloride (OCEAN) 0.65 % SOLN nasal spray Place 1 spray into both nostrils as needed for congestion. 480 mL 3    oxyCODONE-acetaminophen (PERCOCET) 10-325 MG tablet Take 1 tablet by mouth every 4 (four) hours as needed for pain. 30 tablet 0   No facility-administered medications prior to visit.    Allergies  Allergen Reactions   Aspirin Other (See Comments)    heart murmur    ROS Review of Systems  HENT:  Positive for sinus pressure.   Gastrointestinal:  Positive for constipation.  Skin:        Skin changes     Objective:    Physical Exam Constitutional:      General: She is not in acute distress.    Appearance: She is normal weight. She is not ill-appearing, toxic-appearing or diaphoretic.  HENT:     Head: Normocephalic and atraumatic.  Cardiovascular:     Rate and Rhythm: Normal rate and regular rhythm.     Pulses: Normal pulses.     Heart sounds: Normal heart sounds.  Pulmonary:     Effort: Pulmonary effort is normal.     Breath sounds: Normal breath sounds.  Abdominal:     General: Bowel sounds are normal.     Palpations: Abdomen is soft.  Musculoskeletal:        General: Normal range of motion.     Cervical back: Normal range of motion.  Skin:    General: Skin is warm and dry.     Capillary Refill: Capillary refill takes less than 2 seconds.  Neurological:     General: No focal deficit present.     Mental Status: She is alert and oriented to person, place, and time.  Psychiatric:        Mood and Affect: Mood normal.        Behavior: Behavior normal.        Thought Content: Thought content normal.        Judgment: Judgment normal.    BP (!) 148/84   Pulse 70   Temp 98.6 F (37 C)   Ht 6' (1.829 m)   Wt 140 lb 0.4 oz (63.5 kg)   LMP 03/27/2011   SpO2 98%   BMI 18.99 kg/m  Wt Readings from Last 3 Encounters:  09/05/20 140 lb 0.4 oz (63.5 kg)  08/02/20 143 lb (64.9 kg)  07/21/20 144 lb 12.8 oz (65.7 kg)     Health Maintenance Due  Topic Date Due   PAP SMEAR-Modifier  08/07/2018    There are no preventive care reminders to display for this  patient.  Lab Results  Component Value Date   TSH 1.64 03/19/2016   Lab Results  Component Value Date  WBC 3.8 07/07/2020   HGB 13.0 07/07/2020   HCT 39.6 07/07/2020   MCV 94.3 07/07/2020   PLT 199 07/07/2020   Lab Results  Component Value Date   NA 140 07/07/2020   K 4.1 07/07/2020   CO2 27 07/07/2020   GLUCOSE 81 07/07/2020   BUN 16 07/07/2020   CREATININE 1.12 (H) 07/07/2020   BILITOT 0.3 07/07/2020   ALKPHOS 148 (H) 07/20/2019   AST 16 07/07/2020   ALT 8 07/07/2020   PROT 7.2 07/07/2020   ALBUMIN 4.6 07/20/2019   CALCIUM 9.4 07/07/2020   ANIONGAP 11 07/21/2018   Lab Results  Component Value Date   CHOL 168 07/07/2020   Lab Results  Component Value Date   HDL 69 07/07/2020   Lab Results  Component Value Date   LDLCALC 81 07/07/2020   Lab Results  Component Value Date   TRIG 98 07/07/2020   Lab Results  Component Value Date   CHOLHDL 2.4 07/07/2020   Lab Results  Component Value Date   HGBA1C 4.8 03/19/2016      Assessment & Plan:   Problem List Items Addressed This Visit       Cardiovascular and Mediastinum   Essential hypertension Persistent No dose adjustment at this time due to weight changes we will continue to monitor Encouraged on going compliance with current medication regimen Encouraged home monitoring and recording BP <130/80 Eating a heart-healthy diet with less salt        Other   Depression Persistent however stable we will consider counseling   Relevant Medications   mirtazapine (REMERON SOL-TAB) 45 MG disintegrating tablet   Other Visit Diagnoses     Weight loss, unintentional    -  Primary Persistent Patient to restart Ensure Mirtazapine 45 mg restarted Labs pending   Relevant Orders   TSH (Completed)   VITAMIN D 25 Hydroxy (Vit-D Deficiency, Fractures) (Completed)   Vitamin B12 (Completed)   Magnesium (Completed)   Scar conditions and fibrosis of skin       Relevant Orders   Ambulatory referral to  Plastic Surgery       Meds ordered this encounter  Medications   amoxicillin-clavulanate (AUGMENTIN) 875-125 MG tablet    Sig: Take 1 tablet by mouth 2 (two) times daily for 10 days.    Dispense:  20 tablet    Refill:  0    Order Specific Question:   Supervising Provider    Answer:   Tresa Garter [id number]   mirtazapine (REMERON SOL-TAB) 45 MG disintegrating tablet    Sig: Take 1 tablet (45 mg total) by mouth at bedtime.    Dispense:  90 tablet    Refill:  3    Order Specific Question:   Supervising Provider    Answer:   Tresa Garter [id number]    Follow-up: Return in about 3 months (around 12/06/2020).    Vevelyn Francois, NP

## 2020-09-05 NOTE — Patient Instructions (Signed)
Managing Your Hypertension Hypertension, also called high blood pressure, is when the force of the blood pressing against the walls of the arteries is too strong. Arteries are blood vessels that carry blood from your heart throughout your body. Hypertension forces the heart to work harder to pump blood and may cause the arteries tobecome narrow or stiff. Understanding blood pressure readings Your personal target blood pressure may vary depending on your medical conditions, your age, and other factors. A blood pressure reading includes a higher number over a lower number. Ideally, your blood pressure should be below 120/80. You should know that: The first, or top, number is called the systolic pressure. It is a measure of the pressure in your arteries as your heart beats. The second, or bottom number, is called the diastolic pressure. It is a measure of the pressure in your arteries as the heart relaxes. Blood pressure is classified into four stages. Based on your blood pressure reading, your health care provider may use the following stages to determine what type of treatment you need, if any. Systolic pressure and diastolicpressure are measured in a unit called mmHg. Normal Systolic pressure: below 120. Diastolic pressure: below 80. Elevated Systolic pressure: 120-129. Diastolic pressure: below 80. Hypertension stage 1 Systolic pressure: 130-139. Diastolic pressure: 80-89. Hypertension stage 2 Systolic pressure: 140 or above. Diastolic pressure: 90 or above. How can this condition affect me? Managing your hypertension is an important responsibility. Over time, hypertension can damage the arteries and decrease blood flow to important parts of the body, including the brain, heart, and kidneys. Having untreated or uncontrolled hypertension can lead to: A heart attack. A stroke. A weakened blood vessel (aneurysm). Heart failure. Kidney damage. Eye damage. Metabolic syndrome. Memory and  concentration problems. Vascular dementia. What actions can I take to manage this condition? Hypertension can be managed by making lifestyle changes and possibly by taking medicines. Your health care provider will help you make a plan to bring yourblood pressure within a normal range. Nutrition  Eat a diet that is high in fiber and potassium, and low in salt (sodium), added sugar, and fat. An example eating plan is called the Dietary Approaches to Stop Hypertension (DASH) diet. To eat this way: Eat plenty of fresh fruits and vegetables. Try to fill one-half of your plate at each meal with fruits and vegetables. Eat whole grains, such as whole-wheat pasta, brown rice, or whole-grain bread. Fill about one-fourth of your plate with whole grains. Eat low-fat dairy products. Avoid fatty cuts of meat, processed or cured meats, and poultry with skin. Fill about one-fourth of your plate with lean proteins such as fish, chicken without skin, beans, eggs, and tofu. Avoid pre-made and processed foods. These tend to be higher in sodium, added sugar, and fat. Reduce your daily sodium intake. Most people with hypertension should eat less than 1,500 mg of sodium a day.  Lifestyle  Work with your health care provider to maintain a healthy body weight or to lose weight. Ask what an ideal weight is for you. Get at least 30 minutes of exercise that causes your heart to beat faster (aerobic exercise) most days of the week. Activities may include walking, swimming, or biking. Include exercise to strengthen your muscles (resistance exercise), such as weight lifting, as part of your weekly exercise routine. Try to do these types of exercises for 30 minutes at least 3 days a week. Do not use any products that contain nicotine or tobacco, such as cigarettes, e-cigarettes, and chewing   tobacco. If you need help quitting, ask your health care provider. Control any long-term (chronic) conditions you have, such as high  cholesterol or diabetes. Identify your sources of stress and find ways to manage stress. This may include meditation, deep breathing, or making time for fun activities.  Alcohol use Do not drink alcohol if: Your health care provider tells you not to drink. You are pregnant, may be pregnant, or are planning to become pregnant. If you drink alcohol: Limit how much you use to: 0-1 drink a day for women. 0-2 drinks a day for men. Be aware of how much alcohol is in your drink. In the [place] one drink equals one 12 oz bottle of beer (355 mL), one 5 oz glass of wine (148 mL), or one 1 oz glass of hard liquor (44 mL). Medicines Your health care provider may prescribe medicine if lifestyle changes are not enough to get your blood pressure under control and if: Your systolic blood pressure is 130 or higher. Your diastolic blood pressure is 80 or higher. Take medicines only as told by your health care provider. Follow the directions carefully. Blood pressure medicines must be taken as told by your health care provider. The medicine does not work as well when you skip doses. Skippingdoses also puts you at risk for problems. Monitoring Before you monitor your blood pressure: Do not smoke, drink caffeinated beverages, or exercise within 30 minutes before taking a measurement. Use the bathroom and empty your bladder (urinate). Sit quietly for at least 5 minutes before taking measurements. Monitor your blood pressure at home as told by your health care provider. To do this: Sit with your back straight and supported. Place your feet flat on the floor. Do not cross your legs. Support your arm on a flat surface, such as a table. Make sure your upper arm is at heart level. Each time you measure, take two or three readings one minute apart and record the results. You may also need to have your blood pressure checked regularly by your healthcare provider. General information Talk with your health care  provider about your diet, exercise habits, and other lifestyle factors that may be contributing to hypertension. Review all the medicines you take with your health care provider because there may be side effects or interactions. Keep all visits as told by your health care provider. Your health care provider can help you create and adjust your plan for managing your high blood pressure. Where to find more information National Heart, Lung, and Blood Institute: www.nhlbi.nih.gov American Heart Association: www.heart.org Contact a health care provider if: You think you are having a reaction to medicines you have taken. You have repeated (recurrent) headaches. You feel dizzy. You have swelling in your ankles. You have trouble with your vision. Get help right away if: You develop a severe headache or confusion. You have unusual weakness or numbness, or you feel faint. You have severe pain in your chest or abdomen. You vomit repeatedly. You have trouble breathing. These symptoms may represent a serious problem that is an emergency. Do not wait to see if the symptoms will go away. Get medical help right away. Call your local emergency services (911 in the [place] Do not drive yourself to the hospital. Summary Hypertension is when the force of blood pumping through your arteries is too strong. If this condition is not controlled, it may put you at risk for serious complications. Your personal target blood pressure may vary depending on your medical conditions,   your age, and other factors. For most people, a normal blood pressure is less than 120/80. Hypertension is managed by lifestyle changes, medicines, or both. Lifestyle changes to help manage hypertension include losing weight, eating a healthy, low-sodium diet, exercising more, stopping smoking, and limiting alcohol. This information is not intended to replace advice given to you by your health care provider. Make sure you discuss any questions  you have with your healthcare provider. Document Revised: 03/06/2019 Document Reviewed: 12/30/2018 Elsevier Patient Education  2022 Elsevier Inc.  

## 2020-09-06 ENCOUNTER — Encounter: Payer: Self-pay | Admitting: Nurse Practitioner

## 2020-09-06 LAB — VITAMIN D 25 HYDROXY (VIT D DEFICIENCY, FRACTURES): Vit D, 25-Hydroxy: 20.8 ng/mL — ABNORMAL LOW (ref 30.0–100.0)

## 2020-09-06 LAB — MAGNESIUM: Magnesium: 1.9 mg/dL (ref 1.6–2.3)

## 2020-09-06 LAB — VITAMIN B12: Vitamin B-12: 447 pg/mL (ref 232–1245)

## 2020-09-06 LAB — TSH: TSH: 0.969 u[IU]/mL (ref 0.450–4.500)

## 2020-09-12 ENCOUNTER — Telehealth: Payer: Self-pay | Admitting: *Deleted

## 2020-09-12 NOTE — Telephone Encounter (Signed)
-----   Message from Sinclair Grooms sent at 09/09/2020 12:49 PM EDT ----- Regarding: urodynamic I was working [place]   Summary ID: [id number]  Patient: [name], Kras [id number]   Procedure: Urodynamic Profile Study Status: Needs Scheduling Requested appt date:  Authorizing: Nunzio Cobbs, MD in [place]     Expires: 08/02/2021 Priority: Routine         Diagnosis: Female bladder prolapse [N81.10]  Urinary incontinence, unspecified type [R32]

## 2020-09-26 ENCOUNTER — Other Ambulatory Visit: Payer: Self-pay | Admitting: Nurse Practitioner

## 2020-09-26 DIAGNOSIS — Z1231 Encounter for screening mammogram for malignant neoplasm of breast: Secondary | ICD-10-CM

## 2020-09-27 NOTE — Telephone Encounter (Signed)
Left message to call Tabitha Winograd, RN at [facility], [phone].  

## 2020-10-03 ENCOUNTER — Encounter: Payer: Self-pay | Admitting: Infectious Diseases

## 2020-10-03 ENCOUNTER — Other Ambulatory Visit: Payer: Self-pay

## 2020-10-03 ENCOUNTER — Ambulatory Visit
Admission: RE | Admit: 2020-10-03 | Discharge: 2020-10-03 | Disposition: A | Discharge status: Home / Self Care | Payer: 59 | Source: Ambulatory Visit | Attending: Nurse Practitioner | Admitting: Nurse Practitioner

## 2020-10-03 DIAGNOSIS — Z1231 Encounter for screening mammogram for malignant neoplasm of breast: Secondary | ICD-10-CM

## 2020-10-18 ENCOUNTER — Encounter: Payer: Self-pay | Admitting: Podiatry

## 2020-10-21 NOTE — Telephone Encounter (Signed)
MyChart message to patient.   Routing to [employer] for benefits.

## 2020-10-25 NOTE — Telephone Encounter (Signed)
You may discontinue the order for the urodynamics and wait for the patient to call back.   Ok to close this encounter.

## 2020-10-25 NOTE — Telephone Encounter (Signed)
Dr. Quincy Simmonds -no response from patient regarding scheduling urodynamics discussed at [place] in 07/2020. Please advise?

## 2020-10-25 NOTE — Telephone Encounter (Signed)
Order discontinued.   Routing to [employer].   Encounter Closed.

## 2020-11-28 ENCOUNTER — Encounter: Payer: Self-pay | Admitting: Nurse Practitioner

## 2020-11-28 ENCOUNTER — Other Ambulatory Visit: Payer: Self-pay

## 2020-11-28 ENCOUNTER — Ambulatory Visit (INDEPENDENT_AMBULATORY_CARE_PROVIDER_SITE_OTHER): Payer: 59 | Admitting: Nurse Practitioner

## 2020-11-28 VITALS — BP 123/76 | HR 76 | Temp 97.6°F | Ht 72.0 in | Wt 143.0 lb

## 2020-11-28 DIAGNOSIS — I10 Essential (primary) hypertension: Secondary | ICD-10-CM | POA: Diagnosis not present

## 2020-11-28 NOTE — Progress Notes (Signed)
[address], [place]  [address] Phone:  [phone]   Fax:  [phone]   Established Patient Office Visit  Subjective:  Patient ID: [name], female    DOB: September 03, 1960  Age: 60 y.o. MRN: [id number]  CC:  Chief Complaint  Patient presents with   Follow-up    Pt is here today for her follow up visit. Pt was seen by her Ob/Gyn back in June and has not heard anything back from them about getting the urodynamic testing scheduled.    HPI [name] presents for follow up. She  has a past medical history of Anxiety (11/2018), Asthma, Hepatitis C, HIV (human immunodeficiency virus infection) ([place]), Hypertension, STD (sexually transmitted disease), Substance abuse ([place]), and Underweight (07/29/2014).   She reports that she does not check her BP at home. She has not been monitored. Denies headache, dizziness, visual changes, shortness of breath, dyspnea on exertion, chest pain, nausea, vomiting or any edema.   She is taking vitamin and fish oil. She reports that she works more than she eats. She has been able to regain her 3 pounds.     Past Medical History:  Diagnosis Date   Anxiety 11/2018   Asthma    Hepatitis C    HIV (human immunodeficiency virus infection) ([place])    Hypertension    STD (sexually transmitted disease)    Substance abuse ([place])    Underweight 07/29/2014    Past Surgical History:  Procedure Laterality Date   BUNIONECTOMY Left 03/15/2020   COLONOSCOPY WITH PROPOFOL N/A 06/26/2016   Procedure: COLONOSCOPY WITH PROPOFOL;  Surgeon: Otis Brace, MD;  Location: [facility];  Service: Gastroenterology;  Laterality: N/A;   fractured wrist Left    LAPAROSCOPIC APPENDECTOMY N/A 07/20/2018   Procedure: APPENDECTOMY LAPAROSCOPIC;  Surgeon: Clovis Riley, MD;  Location: [facility];  Service: General;  Laterality: N/A;   PANCREATECTOMY      Family History  Problem Relation Age of Onset   Cancer  Mother        bone   Diabetes Sister    Colon cancer Maternal Uncle    Esophageal cancer Neg Hx    Gastric cancer Neg Hx    Pancreatic cancer Neg Hx    Liver disease Neg Hx    Inflammatory bowel disease Neg Hx     Social History   Socioeconomic History   Marital status: Single    Spouse name: Not on file   Number of children: Not on file   Years of education: Not on file   Highest education level: Not on file  Occupational History   Not on file  Tobacco Use   Smoking status: Every Day    Packs/day: 0.50    Years: 33.00    Pack years: 16.50    Types: Cigarettes   Smokeless tobacco: Never   Tobacco comments:    cutting back  Vaping Use   Vaping Use: Never used  Substance and Sexual Activity   Alcohol use: Yes    Alcohol/week: 5.0 standard drinks    Types: 5 Cans of beer per week   Drug use: No    Comment: clean for 10 years   Sexual activity: Not Currently    Birth control/protection: Post-menopausal    Comment: pt. given condoms  Other Topics Concern   Not on file  Social History Narrative   Not on file   Social Determinants of Radio broadcast assistant  Strain: Not on file  Food Insecurity: Not on file  Transportation Needs: Not on file  Physical Activity: Not on file  Stress: Not on file  Social Connections: Not on file  Intimate Partner Violence: Not on file    Outpatient Medications Prior to Visit  Medication Sig Dispense Refill   acetaminophen (TYLENOL) 500 MG tablet Take 1 tablet (500 mg total) by mouth every 6 (six) hours as needed. (Patient taking differently: Take 1,000 mg by mouth every 6 (six) hours as needed for moderate pain (abdominal pain).) 30 tablet 0   amLODipine (NORVASC) 5 MG tablet TAKE 1 TABLET(5 MG) BY MOUTH DAILY 90 tablet 3   BIKTARVY 50-200-25 MG TABS tablet TAKE 1 TABLET BY MOUTH DAILY 30 tablet 5   Ensure (ENSURE) Take 1 Can by mouth 3 (three) times daily between meals. 240 mL 11   fluticasone (FLONASE) 50 MCG/ACT nasal spray  Place 2 sprays into both nostrils daily. 16 g 6   mirtazapine (REMERON SOL-TAB) 45 MG disintegrating tablet Take 1 tablet (45 mg total) by mouth at bedtime. 90 tablet 3   sodium chloride (OCEAN) 0.65 % SOLN nasal spray Place 1 spray into both nostrils as needed for congestion. 480 mL 3   No facility-administered medications prior to visit.    Allergies  Allergen Reactions   Aspirin Other (See Comments)    heart murmur    ROS Review of Systems    Objective:    Physical Exam HENT:     Head: Normocephalic and atraumatic.     Nose: Nose normal.     Mouth/Throat:     Mouth: Mucous membranes are moist.  Cardiovascular:     Rate and Rhythm: Normal rate and regular rhythm.     Pulses: Normal pulses.     Heart sounds: Normal heart sounds.  Pulmonary:     Effort: Pulmonary effort is normal.     Breath sounds: Normal breath sounds.  Abdominal:     Palpations: Abdomen is soft.  Musculoskeletal:        General: Normal range of motion.  Skin:    General: Skin is warm and dry.     Capillary Refill: Capillary refill takes less than 2 seconds.  Neurological:     General: No focal deficit present.     Mental Status: She is alert and oriented to person, place, and time.  Psychiatric:        Mood and Affect: Mood normal.        Behavior: Behavior normal.        Thought Content: Thought content normal.        Judgment: Judgment normal.    BP 123/76   Pulse 76   Temp 97.6 F (36.4 C)   Ht 6' (1.829 m)   Wt 143 lb (64.9 kg)   LMP 03/27/2011   SpO2 100%   BMI 19.39 kg/m  Wt Readings from Last 3 Encounters:  11/28/20 143 lb (64.9 kg)  09/05/20 140 lb 0.4 oz (63.5 kg)  08/02/20 143 lb (64.9 kg)     Health Maintenance Due  Topic Date Due   INFLUENZA VACCINE  09/12/2020    There are no preventive care reminders to display for this patient.  Lab Results  Component Value Date   TSH 0.969 09/05/2020   Lab Results  Component Value Date   WBC 3.8 07/07/2020   HGB 13.0  07/07/2020   HCT 39.6 07/07/2020   MCV 94.3 07/07/2020   PLT 199 07/07/2020  Lab Results  Component Value Date   NA 140 07/07/2020   K 4.1 07/07/2020   CO2 27 07/07/2020   GLUCOSE 81 07/07/2020   BUN 16 07/07/2020   CREATININE 1.12 (H) 07/07/2020   BILITOT 0.3 07/07/2020   ALKPHOS 148 (H) 07/20/2019   AST 16 07/07/2020   ALT 8 07/07/2020   PROT 7.2 07/07/2020   ALBUMIN 4.6 07/20/2019   CALCIUM 9.4 07/07/2020   ANIONGAP 11 07/21/2018   Lab Results  Component Value Date   CHOL 168 07/07/2020   Lab Results  Component Value Date   HDL 69 07/07/2020   Lab Results  Component Value Date   LDLCALC 81 07/07/2020   Lab Results  Component Value Date   TRIG 98 07/07/2020   Lab Results  Component Value Date   CHOLHDL 2.4 07/07/2020   Lab Results  Component Value Date   HGBA1C 4.8 03/19/2016      Assessment & Plan:   Problem List Items Addressed This Visit       Cardiovascular and Mediastinum   Essential hypertension - Primary Encouraged on going compliance with current medication regimen Encouraged home monitoring and recording BP <130/80 Eating a heart-healthy diet with less salt Encouraged regular physical activity      No orders of the defined types were placed in this encounter.   Follow-up: Return in about 6 months (around 05/29/2021) for Follow up HTN [id number].    Vevelyn Francois, NP

## 2020-11-28 NOTE — Patient Instructions (Addendum)
[facility] [phone]  Health Maintenance, Female Adopting a healthy lifestyle and getting preventive care are important in promoting health and wellness. Ask your health care provider about: The right schedule for you to have regular tests and exams. Things you can do on your own to prevent diseases and keep yourself healthy. What should I know about diet, weight, and exercise? Eat a healthy diet  Eat a diet that includes plenty of vegetables, fruits, low-fat dairy products, and lean protein. Do not eat a lot of foods that are high in solid fats, added sugars, or sodium. Maintain a healthy weight Body mass index (BMI) is used to identify weight problems. It estimates body fat based on height and weight. Your health care provider can help determine your BMI and help you achieve or maintain a healthy weight. Get regular exercise Get regular exercise. This is one of the most important things you can do for your health. Most adults should: Exercise for at least 150 minutes each week. The exercise should increase your heart rate and make you sweat (moderate-intensity exercise). Do strengthening exercises at least twice a week. This is in addition to the moderate-intensity exercise. Spend less time sitting. Even light physical activity can be beneficial. Watch cholesterol and blood lipids Have your blood tested for lipids and cholesterol at 60 years of age, then have this test every 5 years. Have your cholesterol levels checked more often if: Your lipid or cholesterol levels are high. You are older than 60 years of age. You are at high risk for heart disease. What should I know about cancer screening? Depending on your health history and family history, you may need to have cancer screening at various ages. This may include screening for: Breast cancer. Cervical cancer. Colorectal cancer. Skin cancer. Lung cancer. What should I know about heart disease, diabetes, and high blood  pressure? Blood pressure and heart disease High blood pressure causes heart disease and increases the risk of stroke. This is more likely to develop in people who have high blood pressure readings, are of African descent, or are overweight. Have your blood pressure checked: Every 3-5 years if you are 5-53 years of age. Every year if you are 92 years old or older. Diabetes Have regular diabetes screenings. This checks your fasting blood sugar level. Have the screening done: Once every three years after age 31 if you are at a normal weight and have a low risk for diabetes. More often and at a younger age if you are overweight or have a high risk for diabetes. What should I know about preventing infection? Hepatitis B If you have a higher risk for hepatitis B, you should be screened for this virus. Talk with your health care provider to find out if you are at risk for hepatitis B infection. Hepatitis C Testing is recommended for: Everyone born from [birth year] through 1965. Anyone with known risk factors for hepatitis C. Sexually transmitted infections (STIs) Get screened for STIs, including gonorrhea and chlamydia, if: You are sexually active and are younger than 60 years of age. You are older than 60 years of age and your health care provider tells you that you are at risk for this type of infection. Your sexual activity has changed since you were last screened, and you are at increased risk for chlamydia or gonorrhea. Ask your health care provider if you are at risk. Ask your health care provider about whether you are at high risk for HIV. Your health care provider  may recommend a prescription medicine to help prevent HIV infection. If you choose to take medicine to prevent HIV, you should first get tested for HIV. You should then be tested every 3 months for as long as you are taking the medicine. Pregnancy If you are about to stop having your period (premenopausal) and you may become pregnant,  seek counseling before you get pregnant. Take 400 to 800 micrograms (mcg) of folic acid every day if you become pregnant. Ask for birth control (contraception) if you want to prevent pregnancy. Osteoporosis and menopause Osteoporosis is a disease in which the bones lose minerals and strength with aging. This can result in bone fractures. If you are 27 years old or older, or if you are at risk for osteoporosis and fractures, ask your health care provider if you should: Be screened for bone loss. Take a calcium or vitamin D supplement to lower your risk of fractures. Be given hormone replacement therapy (HRT) to treat symptoms of menopause. Follow these instructions at home: Lifestyle Do not use any products that contain nicotine or tobacco, such as cigarettes, e-cigarettes, and chewing tobacco. If you need help quitting, ask your health care provider. Do not use street drugs. Do not share needles. Ask your health care provider for help if you need support or information about quitting drugs. Alcohol use Do not drink alcohol if: Your health care provider tells you not to drink. You are pregnant, may be pregnant, or are planning to become pregnant. If you drink alcohol: Limit how much you use to 0-1 drink a day. Limit intake if you are breastfeeding. Be aware of how much alcohol is in your drink. In the [place] one drink equals one 12 oz bottle of beer (355 mL), one 5 oz glass of wine (148 mL), or one 1 oz glass of hard liquor (44 mL). General instructions Schedule regular health, dental, and eye exams. Stay current with your vaccines. Tell your health care provider if: You often feel depressed. You have ever been abused or do not feel safe at home. Summary Adopting a healthy lifestyle and getting preventive care are important in promoting health and wellness. Follow your health care provider's instructions about healthy diet, exercising, and getting tested or screened for diseases. Follow  your health care provider's instructions on monitoring your cholesterol and blood pressure. This information is not intended to replace advice given to you by your health care provider. Make sure you discuss any questions you have with your health care provider. Document Revised: 04/08/2020 Document Reviewed: 01/22/2018 Elsevier Patient Education  2022 [employer].

## 2020-12-05 ENCOUNTER — Ambulatory Visit: Payer: 59 | Admitting: Nurse Practitioner

## 2020-12-13 ENCOUNTER — Other Ambulatory Visit: Payer: Self-pay | Admitting: Infectious Diseases

## 2020-12-13 DIAGNOSIS — B20 Human immunodeficiency virus [HIV] disease: Secondary | ICD-10-CM

## 2021-02-22 ENCOUNTER — Encounter: Payer: Self-pay | Admitting: Podiatry

## 2021-03-06 ENCOUNTER — Telehealth: Payer: Self-pay | Admitting: Podiatry

## 2021-03-06 ENCOUNTER — Other Ambulatory Visit (HOSPITAL_COMMUNITY): Payer: Self-pay

## 2021-03-06 NOTE — Telephone Encounter (Signed)
Patient would like to continue restrictions at work, she was last seen 05/30/2020. She would like light duty, no going up and down stairs and sit as needed.

## 2021-03-06 NOTE — Telephone Encounter (Signed)
Cannot do after this period of not seeing her

## 2021-03-07 ENCOUNTER — Encounter: Payer: Self-pay | Admitting: Podiatry

## 2021-03-15 ENCOUNTER — Ambulatory Visit: Payer: Self-pay | Admitting: Podiatry

## 2021-03-15 ENCOUNTER — Telehealth: Payer: Self-pay | Admitting: *Deleted

## 2021-03-15 ENCOUNTER — Ambulatory Visit: Payer: PRIVATE HEALTH INSURANCE | Admitting: Podiatry

## 2021-03-15 NOTE — Telephone Encounter (Signed)
Patient came into office requesting to schedule urodynamics testing. Patient reports worsening symptoms -pressure at the end of the day and discomfort. Denies urinary symptoms, vag d/c or bleeding.   Last seen in office 08/02/20, patient never returned call to schedule urodynamics testing. Advised would need OV for evaluation, patient agreeable. OV scheduled for 03/20/21 at 4pm. Patient denied earlier OV offered due to work schedule. Patient agreeable.   Patient to front office to update insurance on file.   Routing to provider for final review. Patient is agreeable to disposition. Will close encounter.

## 2021-03-16 NOTE — Progress Notes (Deleted)
GYNECOLOGY  VISIT   HPI: 61 y.o.   Single  African American  female   [phone] with Patient's last menstrual period was 03/27/2011.   here for uterine prolapse.   GYNECOLOGIC HISTORY: Patient's last menstrual period was 03/27/2011. Contraception:  PMP Menopausal hormone therapy:  none Last mammogram:  10-03-20 Neg/BiRads1 Last pap smear:   08-06-17 Neg:Neg HR HPV, 07-02-12 Neg:Neg HR HPV, 04-11-11 Neg.  Paps always normal        OB History     Gravida  4   Para  2   Term  2   Preterm      AB  2   Living  2      SAB      IAB  2   Ectopic      Multiple      Live Births                 Patient Active Problem List   Diagnosis Date Noted   Uterine prolapse 07/21/2020   Sinusitis 03/02/2019   Callus of foot 12/12/2016   Multiple renal cysts 08/14/2016   Constipation 11/30/2014   Essential hypertension 11/30/2014   Hot flashes, menopausal 01/27/2014   Asthma 01/23/2011   HEADACHE 04/14/2008   CHOLELITHIASIS 12/15/2007   PERIPHERAL NEUROPATHY 11/07/2007   Avulsion fracture of tooth 07/23/2007   Depression 07/24/2006   Human immunodeficiency virus (HIV) disease ([place]) 12/14/2005   MACROCYTIC ANEMIA 12/14/2005   TOBACCO ABUSE 12/14/2005   SUBSTANCE ABUSE 12/14/2005   GERD 12/14/2005   PNEUMONIA, HX OF 12/14/2005   COLPOSCOPY, HX OF 12/14/2005    Past Medical History:  Diagnosis Date   Anxiety 11/2018   Asthma    Hepatitis C    HIV (human immunodeficiency virus infection) ([place])    Hypertension    STD (sexually transmitted disease)    Substance abuse ([place])    Underweight 07/29/2014    Past Surgical History:  Procedure Laterality Date   BUNIONECTOMY Left 03/15/2020   COLONOSCOPY WITH PROPOFOL N/A 06/26/2016   Procedure: COLONOSCOPY WITH PROPOFOL;  Surgeon: Otis Brace, MD;  [facility];  Service: Gastroenterology;  Laterality: N/A;   fractured wrist Left    LAPAROSCOPIC APPENDECTOMY N/A 07/20/2018   Procedure: APPENDECTOMY  LAPAROSCOPIC;  Surgeon: Clovis Riley, MD;  Location: [facility];  Service: General;  Laterality: N/A;   PANCREATECTOMY      Current Outpatient Medications  Medication Sig Dispense Refill   acetaminophen (TYLENOL) 500 MG tablet Take 1 tablet (500 mg total) by mouth every 6 (six) hours as needed. (Patient taking differently: Take 1,000 mg by mouth every 6 (six) hours as needed for moderate pain (abdominal pain).) 30 tablet 0   amLODipine (NORVASC) 5 MG tablet TAKE 1 TABLET(5 MG) BY MOUTH DAILY 90 tablet 3   BIKTARVY 50-200-25 MG TABS tablet TAKE 1 TABLET BY MOUTH DAILY 30 tablet 5   Ensure (ENSURE) Take 1 Can by mouth 3 (three) times daily between meals. 240 mL 11   fluticasone (FLONASE) 50 MCG/ACT nasal spray Place 2 sprays into both nostrils daily. 16 g 6   mirtazapine (REMERON SOL-TAB) 45 MG disintegrating tablet Take 1 tablet (45 mg total) by mouth at bedtime. 90 tablet 3   sodium chloride (OCEAN) 0.65 % SOLN nasal spray Place 1 spray into both nostrils as needed for congestion. 480 mL 3   No current facility-administered medications for this visit.     ALLERGIES: Aspirin  Family History  Problem Relation Age of  Onset   Cancer Mother        bone   Diabetes Sister    Colon cancer Maternal Uncle    Esophageal cancer Neg Hx    Gastric cancer Neg Hx    Pancreatic cancer Neg Hx    Liver disease Neg Hx    Inflammatory bowel disease Neg Hx     Social History   Socioeconomic History   Marital status: Single    Spouse name: Not on file   Number of children: Not on file   Years of education: Not on file   Highest education level: Not on file  Occupational History   Not on file  Tobacco Use   Smoking status: Every Day    Packs/day: 0.50    Years: 33.00    Pack years: 16.50    Types: Cigarettes   Smokeless tobacco: Never   Tobacco comments:    cutting back  Vaping Use   Vaping Use: Never used  Substance and Sexual Activity   Alcohol use: Yes    Alcohol/week: 5.0 standard  drinks    Types: 5 Cans of beer per week   Drug use: No    Comment: clean for 10 years   Sexual activity: Not Currently    Birth control/protection: Post-menopausal    Comment: pt. given condoms  Other Topics Concern   Not on file  Social History Narrative   Not on file   Social Determinants of Health   Financial Resource Strain: Not on file  Food Insecurity: Not on file  Transportation Needs: Not on file  Physical Activity: Not on file  Stress: Not on file  Social Connections: Not on file  Intimate Partner Violence: Not on file    Review of Systems  PHYSICAL EXAMINATION:    LMP 03/27/2011     General appearance: alert, cooperative and appears stated age Head: Normocephalic, without obvious abnormality, atraumatic Neck: no adenopathy, supple, symmetrical, trachea midline and thyroid normal to inspection and palpation Lungs: clear to auscultation bilaterally Breasts: normal appearance, no masses or tenderness, No nipple retraction or dimpling, No nipple discharge or bleeding, No axillary or supraclavicular adenopathy Heart: regular rate and rhythm Abdomen: soft, non-tender, no masses,  no organomegaly Extremities: extremities normal, atraumatic, no cyanosis or edema Skin: Skin color, texture, turgor normal. No rashes or lesions Lymph nodes: Cervical, supraclavicular, and axillary nodes normal. No abnormal inguinal nodes palpated Neurologic: Grossly normal  Pelvic: External genitalia:  no lesions              Urethra:  normal appearing urethra with no masses, tenderness or lesions              Bartholins and Skenes: normal                 Vagina: normal appearing vagina with normal color and discharge, no lesions              Cervix: no lesions                Bimanual Exam:  Uterus:  normal size, contour, position, consistency, mobility, non-tender              Adnexa: no mass, fullness, tenderness              Rectal exam: {yes no:[id number]}.  Confirms.               Anus:  normal sphincter tone, no lesions  Chaperone was present for exam:  ***  ASSESSMENT     PLAN     An After Visit Summary was printed and given to the patient.  ______ minutes face to face time of which over 50% was spent in counseling.

## 2021-03-20 ENCOUNTER — Ambulatory Visit: Payer: Managed Care, Other (non HMO) | Admitting: Obstetrics and Gynecology

## 2021-03-20 DIAGNOSIS — Z0289 Encounter for other administrative examinations: Secondary | ICD-10-CM

## 2021-03-24 NOTE — Addendum Note (Signed)
Addended by: Tomi Bamberger on: 03/24/2021 02:20 PM   Modules accepted: Orders

## 2021-03-27 ENCOUNTER — Other Ambulatory Visit: Payer: Self-pay

## 2021-03-27 ENCOUNTER — Other Ambulatory Visit: Payer: 59

## 2021-03-27 DIAGNOSIS — Z79899 Other long term (current) drug therapy: Secondary | ICD-10-CM

## 2021-03-27 DIAGNOSIS — B20 Human immunodeficiency virus [HIV] disease: Secondary | ICD-10-CM

## 2021-03-27 DIAGNOSIS — Z113 Encounter for screening for infections with a predominantly sexual mode of transmission: Secondary | ICD-10-CM

## 2021-03-29 LAB — CBC
HCT: 41.1 % (ref 35.0–45.0)
Hemoglobin: 13.4 g/dL (ref 11.7–15.5)
MCH: 30.5 pg (ref 27.0–33.0)
MCHC: 32.6 g/dL (ref 32.0–36.0)
MCV: 93.6 fL (ref 80.0–100.0)
MPV: 10.9 fL (ref 7.5–12.5)
Platelets: 243 10*3/uL (ref 140–400)
RBC: 4.39 10*6/uL (ref 3.80–5.10)
RDW: 11.8 % (ref 11.0–15.0)
WBC: 5.8 10*3/uL (ref 3.8–10.8)

## 2021-03-29 LAB — COMPREHENSIVE METABOLIC PANEL
AG Ratio: 1.4 (calc) (ref 1.0–2.5)
ALT: 11 U/L (ref 6–29)
AST: 21 U/L (ref 10–35)
Albumin: 4.6 g/dL (ref 3.6–5.1)
Alkaline phosphatase (APISO): 138 U/L (ref 37–153)
BUN/Creatinine Ratio: 16 (calc) (ref 6–22)
BUN: 23 mg/dL (ref 7–25)
CO2: 25 mmol/L (ref 20–32)
Calcium: 9.6 mg/dL (ref 8.6–10.4)
Chloride: 106 mmol/L (ref 98–110)
Creat: 1.43 mg/dL — ABNORMAL HIGH (ref 0.50–1.05)
Globulin: 3.2 g/dL (calc) (ref 1.9–3.7)
Glucose, Bld: 93 mg/dL (ref 65–99)
Potassium: 4.4 mmol/L (ref 3.5–5.3)
Sodium: 140 mmol/L (ref 135–146)
Total Bilirubin: 0.3 mg/dL (ref 0.2–1.2)
Total Protein: 7.8 g/dL (ref 6.1–8.1)

## 2021-03-29 LAB — LIPID PANEL
Cholesterol: 168 mg/dL (ref <200)
HDL: 68 mg/dL (ref >=50)
LDL Cholesterol (Calc): 73 mg/dL (calc)
Non-HDL Cholesterol (Calc): 100 mg/dL (calc) (ref <130)
Total CHOL/HDL Ratio: 2.5 (calc) (ref <5.0)
Triglycerides: 169 mg/dL — ABNORMAL HIGH (ref <150)

## 2021-03-29 LAB — RPR TITER: RPR Titer: 1:2 {titer} — ABNORMAL HIGH

## 2021-03-29 LAB — RPR: RPR Ser Ql: REACTIVE — AB

## 2021-03-29 LAB — C. TRACHOMATIS/N. GONORRHOEAE RNA
C. trachomatis RNA, TMA: NOT DETECTED
N. gonorrhoeae RNA, TMA: NOT DETECTED

## 2021-03-29 LAB — T-HELPER CELLS (CD4) COUNT (NOT AT ARMC)
Absolute CD4: 591 cells/uL (ref 490–1740)
CD4 T Helper %: 30 % (ref 30–61)
Total lymphocyte count: 1991 cells/uL (ref 850–3900)

## 2021-03-29 LAB — HIV-1 RNA QUANT-NO REFLEX-BLD
HIV 1 RNA Quant: NOT DETECTED Copies/mL
HIV-1 RNA Quant, Log: NOT DETECTED Log cps/mL

## 2021-03-29 LAB — FLUORESCENT TREPONEMAL AB(FTA)-IGG-BLD: Fluorescent Treponemal ABS: NONREACTIVE

## 2021-04-13 ENCOUNTER — Encounter: Payer: Self-pay | Admitting: Infectious Diseases

## 2021-04-13 ENCOUNTER — Ambulatory Visit: Payer: Self-pay | Admitting: Infectious Diseases

## 2021-04-13 ENCOUNTER — Other Ambulatory Visit: Payer: Self-pay

## 2021-04-13 VITALS — BP 130/90 | HR 67 | Temp 97.6°F | Wt 144.0 lb

## 2021-04-13 DIAGNOSIS — N814 Uterovaginal prolapse, unspecified: Secondary | ICD-10-CM

## 2021-04-13 DIAGNOSIS — I10 Essential (primary) hypertension: Secondary | ICD-10-CM

## 2021-04-13 DIAGNOSIS — Z79899 Other long term (current) drug therapy: Secondary | ICD-10-CM

## 2021-04-13 DIAGNOSIS — Z113 Encounter for screening for infections with a predominantly sexual mode of transmission: Secondary | ICD-10-CM

## 2021-04-13 DIAGNOSIS — B20 Human immunodeficiency virus [HIV] disease: Secondary | ICD-10-CM

## 2021-04-13 DIAGNOSIS — F17200 Nicotine dependence, unspecified, uncomplicated: Secondary | ICD-10-CM

## 2021-04-13 NOTE — Progress Notes (Signed)
? ?  Subjective:  ? ? Patient ID: [name], female  DOB: [birth year], 61 y.o.        MRN: [id number] ? ? ?HPI ?61 yo F with HIV+ and cured Hep C (u/s 2009 no elastogram) and tobacco use. Has been on Odefsy, prior was on atripla (for convenience) since 2007 after having been successfully maintained on Nelfinavir for many years.   ?She was changed to biktarvy 11-2018 due to protonix.  ?She had colon 06-2016 (single tubular adenoma, hemorrhoids). Next set at 10 yrs.  ?   ?No problems with her biktarvy.  ?She had foot surgery (bunion removed) 03-29-20.  ?Noted a "ball" sticking out of her vagina at prev visit. She was seen by GYN  but now waiting for "specialist".  ?She is still has some concerns about hair loss. Now has a weave.  ? ?Has questions about cabaneuva.  ? ?HIV 1 RNA Quant (Copies/mL)  ?Date Value  ?03/27/2021 Not Detected  ?07/07/2020 Not Detected  ?10/08/2019 27 (H)  ? ?CD4 T Cell Abs (/uL)  ?Date Value  ?07/07/2020 444  ?10/08/2019 419  ?12/17/2018 479  ? ?CD4 is 591.  ? ?Health Maintenance  ?Topic Date Due  ? Zoster Vaccines- Shingrix (1 of 2) Never done  ? PAP SMEAR-Modifier  08/07/2018  ? COVID-19 Vaccine (4 - Booster for Pfizer series) 02/01/2020  ? INFLUENZA VACCINE  05/12/2021 (Originally 09/12/2020)  ? MAMMOGRAM  10/04/2022  ? TETANUS/TDAP  06/20/2026  ? COLONOSCOPY (Pts 45-44yrs Insurance coverage will need to be confirmed)  06/27/2026  ? Hepatitis C Screening  Completed  ? HIV Screening  Completed  ? HPV VACCINES  Aged Out  ? ? ? ? ?Review of Systems  ?Constitutional:  Negative for chills, fever and weight loss.  ?Respiratory:  Negative for cough and shortness of breath.   ?Gastrointestinal:  Negative for constipation and diarrhea.  ?Genitourinary:  Negative for dysuria.  ?Psychiatric/Behavioral:  The patient does not have insomnia.   ? ?Please see HPI. All other systems reviewed and negative. ? ?   ?Objective:  ?Physical Exam ?Vitals reviewed.  ?HENT:  ?   Mouth/Throat:  ?   Mouth: Mucous  membranes are moist.  ?   Pharynx: No oropharyngeal exudate.  ?Eyes:  ?   Extraocular Movements: Extraocular movements intact.  ?   Pupils: Pupils are equal, round, and reactive to light.  ?Cardiovascular:  ?   Rate and Rhythm: Normal rate and regular rhythm.  ?Pulmonary:  ?   Effort: Pulmonary effort is normal.  ?   Breath sounds: Normal breath sounds.  ?Abdominal:  ?   General: Bowel sounds are normal. There is no distension.  ?   Palpations: Abdomen is soft.  ?   Tenderness: There is no abdominal tenderness.  ?Musculoskeletal:  ?   Cervical back: Normal range of motion and neck supple.  ? ? ? ? ? ? ?   ?Assessment & Plan:  ? ?

## 2021-04-13 NOTE — Assessment & Plan Note (Signed)
Well controlled on CCB.  ?Appreciate PCP.  ?

## 2021-04-13 NOTE — Assessment & Plan Note (Signed)
apprecaite GYN f/u ?Will f/u with them about next steps.  ?

## 2021-04-13 NOTE — Assessment & Plan Note (Signed)
Encouraged to quit. 

## 2021-04-13 NOTE — Assessment & Plan Note (Signed)
Doing well ?Will write rx for ensure ?Will write rx for vaccines ?She is considering cabaneuva ?Dental referral placed today for [facility]. Information to schedule appointment completed today. ?Offered/refused condoms.  ? ?Will see her in 6 months.  ?

## 2021-04-14 ENCOUNTER — Telehealth: Payer: Self-pay

## 2021-04-14 NOTE — Telephone Encounter (Signed)
Attempted to contact patient to advise that she may call her GYN and schedule appt per Dr Johnnye Sima. She does not need a new referral according to [place]. Left VM to call [facility]. I also sent mychart message.  ?

## 2021-04-17 NOTE — Telephone Encounter (Signed)
Called patient to relay info regarding gynecology appointment, no answer.  ? ?Beryle Flock, RN ? ?

## 2021-04-17 NOTE — Telephone Encounter (Signed)
Sent mychart message to patient and left another voicemail. Also sent message to [place] office and they will continue to reach out and schedule patient.  ?

## 2021-05-29 ENCOUNTER — Ambulatory Visit: Payer: 59 | Admitting: Nurse Practitioner

## 2021-06-05 ENCOUNTER — Ambulatory Visit (INDEPENDENT_AMBULATORY_CARE_PROVIDER_SITE_OTHER): Payer: 59 | Admitting: Nurse Practitioner

## 2021-06-05 VITALS — BP 128/84 | HR 82 | Temp 98.4°F | Ht 72.0 in | Wt 145.2 lb

## 2021-06-05 DIAGNOSIS — F17200 Nicotine dependence, unspecified, uncomplicated: Secondary | ICD-10-CM

## 2021-06-05 DIAGNOSIS — I10 Essential (primary) hypertension: Secondary | ICD-10-CM

## 2021-06-05 DIAGNOSIS — N814 Uterovaginal prolapse, unspecified: Secondary | ICD-10-CM

## 2021-06-05 DIAGNOSIS — S76212A Strain of adductor muscle, fascia and tendon of left thigh, initial encounter: Secondary | ICD-10-CM

## 2021-06-05 MED ORDER — CYCLOBENZAPRINE HCL 10 MG PO TABS: 10.0000 mg | ORAL_TABLET | Freq: Three times a day (TID) | ORAL | 0 refills | Status: DC | PRN

## 2021-06-05 NOTE — Patient Instructions (Signed)
You were seen today in the [facility] for reevaluation of B/P and groin pain. You were prescribed medications, please take as directed. Please follow up in 3 mths for reevaluation of symptoms. ?

## 2021-06-05 NOTE — Progress Notes (Signed)
? ?[facility] ?[place], [place]  [id number] ?Phone:  [phone]   Fax:  [phone] ?Subjective:  ? Patient ID: [name], female    DOB: 01/12/1961, 61 y.o.   MRN: [id number] ? ?Chief Complaint  ?Patient presents with  ? Follow-up  ?  Pt is here for 6 months BP follow up. Pt stated she had pain in her left groin   ? ?HPI ?[place]igo HARVEST DEIST 61 y.o. female  has a past medical history of Anxiety (11/2018), Asthma, Hepatitis C, HIV (human immunodeficiency virus infection) ([place]), Hypertension, STD (sexually transmitted disease), Substance abuse ([place]), and Underweight (07/29/2014). To the [facility] for reevaluation of B/P. ? ?Hypertension: Patient here for follow-up of elevated blood pressure. She is not exercising and is not adherent to low salt diet.   Cardiac symptoms none. Patient denies none.  Cardiovascular risk factors: hypertension and smoking/ tobacco exposure. Use of agents associated with hypertension: none. History of target organ damage: none. Does not monitor B/P at home. ? ?Currently works at [employer] and smokes regularly. When questioned about cigarette smoking, states that she is not ready to quit at this time. States that she is concerned about bulge from vagina that she has had for several years. Patient requesting referral to Ob/Gyn for hysterectomy. Denies any pain, but states that she has some discomfort when urinating. Endorses being able to reduce bulge without difficulty.  ? ?Also concerned about muscle pain in left groin, which she has had x 4-5 mths. Has been taking tylenol with no improvement. Pain worsens with certain movements and heavy lifting. Denies any recent trauma or injury. ? ?Denies any other concerns today. Denies any fatigue, chest pain, shortness of breath, HA or dizziness. Denies any blurred vision, numbness or tingling. ? ? ?Past Medical History:  ?Diagnosis Date  ? Anxiety 11/2018  ? Asthma   ? Hepatitis C   ? HIV (human  immunodeficiency virus infection) ([place])   ? Hypertension   ? STD (sexually transmitted disease)   ? Substance abuse ([place])   ? Underweight 07/29/2014  ? ? ?Past Surgical History:  ?Procedure Laterality Date  ? BUNIONECTOMY Left 03/15/2020  ? COLONOSCOPY WITH PROPOFOL N/A 06/26/2016  ? Procedure: COLONOSCOPY WITH PROPOFOL;  Surgeon: Otis Brace, MD;  Location: [facility];  Service: Gastroenterology;  Laterality: N/A;  ? fractured wrist Left   ? LAPAROSCOPIC APPENDECTOMY N/A 07/20/2018  ? Procedure: APPENDECTOMY LAPAROSCOPIC;  Surgeon: Clovis Riley, MD;  Location: [place];  Service: General;  Laterality: N/A;  ? PANCREATECTOMY    ? ? ?Family History  ?Problem Relation Age of Onset  ? Cancer Mother   ?     bone  ? Diabetes Sister   ? Colon cancer Maternal Uncle   ? Esophageal cancer Neg Hx   ? Gastric cancer Neg Hx   ? Pancreatic cancer Neg Hx   ? Liver disease Neg Hx   ? Inflammatory bowel disease Neg Hx   ? ? ?Social History  ? ?Socioeconomic History  ? Marital status: Single  ?  Spouse name: Not on file  ? Number of children: Not on file  ? Years of education: Not on file  ? Highest education level: Not on file  ?Occupational History  ? Not on file  ?Tobacco Use  ? Smoking status: Every Day  ?  Packs/day: 0.50  ?  Years: 33.00  ?  Pack years: 16.50  ?  Types: Cigarettes  ? Smokeless tobacco:  Never  ? Tobacco comments:  ?  cutting back  ?Vaping Use  ? Vaping Use: Never used  ?Substance and Sexual Activity  ? Alcohol use: Yes  ?  Alcohol/week: 5.0 standard drinks  ?  Types: 5 Cans of beer per week  ? Drug use: No  ?  Comment: clean for 10 years  ? Sexual activity: Not Currently  ?  Birth control/protection: Post-menopausal  ?  Comment: pt. given condoms  ?Other Topics Concern  ? Not on file  ?Social History Narrative  ? Not on file  ? ?Social Determinants of Health  ? ?Financial Resource Strain: Not on file  ?Food Insecurity: Not on file  ?Transportation Needs: Not on file  ?Physical Activity: Not on file   ?Stress: Not on file  ?Social Connections: Not on file  ?Intimate Partner Violence: Not on file  ? ? ?Outpatient Medications Prior to Visit  ?Medication Sig Dispense Refill  ? acetaminophen (TYLENOL) 500 MG tablet Take 1 tablet (500 mg total) by mouth every 6 (six) hours as needed. (Patient taking differently: Take 1,000 mg by mouth every 6 (six) hours as needed for moderate pain (abdominal pain).) 30 tablet 0  ? amLODipine (NORVASC) 5 MG tablet TAKE 1 TABLET(5 MG) BY MOUTH DAILY 90 tablet 3  ? BIKTARVY 50-200-25 MG TABS tablet TAKE 1 TABLET BY MOUTH DAILY 30 tablet 5  ? Ensure (ENSURE) Take 1 Can by mouth 3 (three) times daily between meals. 240 mL 11  ? fluticasone (FLONASE) 50 MCG/ACT nasal spray Place 2 sprays into both nostrils daily. 16 g 6  ? sodium chloride (OCEAN) 0.65 % SOLN nasal spray Place 1 spray into both nostrils as needed for congestion. 480 mL 3  ? mirtazapine (REMERON SOL-TAB) 45 MG disintegrating tablet Take 1 tablet (45 mg total) by mouth at bedtime. (Patient not taking: Reported on 06/05/2021) 90 tablet 3  ? ?No facility-administered medications prior to visit.  ? ? ?Allergies  ?Allergen Reactions  ? Aspirin Other (See Comments)  ?  heart murmur  ? ? ?Review of Systems  ?Constitutional:  Negative for chills, fever and malaise/fatigue.  ?Respiratory:  Negative for cough and shortness of breath.   ?Cardiovascular:  Negative for chest pain, palpitations and leg swelling.  ?Gastrointestinal:  Negative for abdominal pain, blood in stool, constipation, diarrhea, nausea and vomiting.  ?Genitourinary:   ?     See HPI  ?Musculoskeletal:   ?     See HPI  ?Skin: Negative.   ?Neurological: Negative.   ?Psychiatric/Behavioral:  Negative for depression. The patient is not nervous/anxious.   ?All other systems reviewed and are negative. ? ?   ?Objective:  ?  ?Physical Exam ?Vitals reviewed.  ?Constitutional:   ?   General: She is not in acute distress. ?   Appearance: Normal appearance. She is normal weight.   ?HENT:  ?   Head: Normocephalic.  ?Neck:  ?   Vascular: No carotid bruit.  ?Cardiovascular:  ?   Rate and Rhythm: Normal rate and regular rhythm.  ?   Pulses: Normal pulses.  ?   Heart sounds: Normal heart sounds.  ?   Comments: No obvious peripheral edema ?Pulmonary:  ?   Effort: Pulmonary effort is normal.  ?   Breath sounds: Normal breath sounds.  ?Genitourinary: ?   Comments: Exam deferred ?Musculoskeletal:     ?   General: No swelling, tenderness, deformity or signs of injury. Normal range of motion.  ?   Cervical back: Normal  range of motion and neck supple. No rigidity or tenderness.  ?   Right lower leg: No edema.  ?   Left lower leg: No edema.  ?   Comments: Left groin non tender to palpation  ?Lymphadenopathy:  ?   Cervical: No cervical adenopathy.  ?Skin: ?   General: Skin is warm and dry.  ?   Capillary Refill: Capillary refill takes less than 2 seconds.  ?Neurological:  ?   General: No focal deficit present.  ?   Mental Status: She is alert and oriented to person, place, and time.  ?Psychiatric:     ?   Mood and Affect: Mood normal.     ?   Behavior: Behavior normal.     ?   Thought Content: Thought content normal.     ?   Judgment: Judgment normal.  ? ? ?BP 128/84 (BP Location: Right Arm, Patient Position: Sitting, Cuff Size: Normal)   Pulse 82   Temp 98.4 ?F (36.9 ?C)   Ht 6' (1.829 m)   Wt 145 lb 3.2 oz (65.9 kg)   LMP 03/27/2011   SpO2 100%   BMI 19.69 kg/m?  ?Wt Readings from Last 3 Encounters:  ?06/05/21 145 lb 3.2 oz (65.9 kg)  ?04/13/21 144 lb (65.3 kg)  ?11/28/20 143 lb (64.9 kg)  ? ? ?Immunization History  ?Administered Date(s) Administered  ? Hepatitis A 03/06/2002  ? Hepatitis B 01/05/2003, 02/04/2003, 07/01/2003  ? Influenza Split 12/01/2010, 12/05/2011  ? Influenza Whole 10/24/2005, 11/07/2007, 12/01/2008, 11/30/2009  ? Influenza,inj,Quad PF,6+ Mos 12/31/2012, 09/30/2013, 11/30/2014, 11/14/2015, 12/12/2016, 11/26/2017, 11/10/2018, 10/22/2019  ? PFIZER(Purple Top)SARS-COV-2  Vaccination 05/14/2019, 06/09/2019, 12/07/2019  ? Pneumococcal Conjugate-13 07/03/2017  ? Pneumococcal Polysaccharide-23 03/14/2010, 11/14/2015  ? Tdap 06/19/2016  ? ? ?Diabetic Foot Exam - Simple   ?No data filed ?  ? ? ?

## 2021-06-07 ENCOUNTER — Encounter: Payer: Self-pay | Admitting: Nurse Practitioner

## 2021-06-07 ENCOUNTER — Other Ambulatory Visit (HOSPITAL_COMMUNITY): Payer: Self-pay

## 2021-06-09 ENCOUNTER — Other Ambulatory Visit: Payer: Self-pay | Admitting: Pharmacist

## 2021-06-09 DIAGNOSIS — B20 Human immunodeficiency virus [HIV] disease: Secondary | ICD-10-CM

## 2021-06-09 MED ORDER — BIKTARVY 50-200-25 MG PO TABS: 1.0000 | ORAL_TABLET | Freq: Every day | ORAL | 0 refills | Status: AC

## 2021-06-09 NOTE — Progress Notes (Signed)
Medication Samples have been provided to the patient.  Drug name: Biktarvy        Strength: 50/200/25 mg       Qty: 14 tablets (2 bottles)   LOT: [id number]   Exp.Date: 10/2023  Dosing instructions: Take one tablet by mouth once daily  The patient has been instructed regarding the correct time, dose, and frequency of taking this medication, including desired effects and most common side effects.   Pollyann Roa L. Tyrease Vandeberg, PharmD, BCIDP, AAHIVP, CPP Clinical Pharmacist Practitioner Infectious Diseases Clinical Pharmacist [facility] 01/25/2020, 10:07 AM  

## 2021-06-19 ENCOUNTER — Other Ambulatory Visit (HOSPITAL_COMMUNITY): Payer: Self-pay

## 2021-07-07 ENCOUNTER — Encounter: Payer: Self-pay | Admitting: Nurse Practitioner

## 2021-07-07 ENCOUNTER — Ambulatory Visit (INDEPENDENT_AMBULATORY_CARE_PROVIDER_SITE_OTHER): Payer: Self-pay | Admitting: Nurse Practitioner

## 2021-07-07 ENCOUNTER — Other Ambulatory Visit: Payer: Self-pay

## 2021-07-07 VITALS — Temp 97.9°F | Ht 72.0 in | Wt 144.2 lb

## 2021-07-07 DIAGNOSIS — I10 Essential (primary) hypertension: Secondary | ICD-10-CM

## 2021-07-07 DIAGNOSIS — J0100 Acute maxillary sinusitis, unspecified: Secondary | ICD-10-CM

## 2021-07-07 DIAGNOSIS — J0110 Acute frontal sinusitis, unspecified: Secondary | ICD-10-CM

## 2021-07-07 MED ORDER — AMLODIPINE BESYLATE 5 MG PO TABS: ORAL_TABLET | ORAL | 3 refills | Status: DC

## 2021-07-07 MED ORDER — FLUTICASONE PROPIONATE 50 MCG/ACT NA SUSP: 2.0000 | Freq: Every day | NASAL | 6 refills | Status: DC

## 2021-07-07 MED ORDER — PREDNISONE 20 MG PO TABS: 20.0000 mg | ORAL_TABLET | Freq: Every day | ORAL | 0 refills | Status: AC

## 2021-07-07 MED ORDER — AZITHROMYCIN 250 MG PO TABS: ORAL_TABLET | ORAL | 0 refills | Status: AC

## 2021-07-07 NOTE — Assessment & Plan Note (Signed)
-   amLODipine (NORVASC) 5 MG tablet; TAKE 1 TABLET(5 MG) BY MOUTH DAILY  Dispense: 90 tablet; Refill: 3  2. Acute non-recurrent maxillary sinusitis  - azithromycin (ZITHROMAX) 250 MG tablet; Take 2 tablets on day 1, then 1 tablet daily on days 2 through 5  Dispense: 6 tablet; Refill: 0 - predniSONE (DELTASONE) 20 MG tablet; Take 1 tablet (20 mg total) by mouth daily with breakfast for 5 days.  Dispense: 5 tablet; Refill: 0  Follow up:  Follow up 3 months

## 2021-07-07 NOTE — Progress Notes (Signed)
$'@Patient'U$  ID: [name], female    DOB: November 05, 1960, 61 y.o.   MRN: [id number]  Chief Complaint  Patient presents with   Sinusitis    Pt stated she think she has a sinus infection has nasal congestion and pressure. Pt stated she has a headache and has been feeling like this for week. Pt is requesting a refill on amLODipine.    Referring provider: Vevelyn Francois, NP   HPI  [name] is a 61 y.o. female who complains of coryza, congestion, sneezing, sore throat, swollen glands, post nasal drip, and bilateral sinus pain for 7 days. She denies a history of fevers, myalgias, wheezing, and cough and does have a history of asthma. Patient does smoke cigarettes. Patient has tried OTC saline nasal spray and allergy medications with minimal relief noted.         Allergies  Allergen Reactions   Aspirin Other (See Comments)    heart murmur    Immunization History  Administered Date(s) Administered   Hepatitis A 03/06/2002   Hepatitis B 01/05/2003, 02/04/2003, 07/01/2003   Influenza Split 12/01/2010, 12/05/2011   Influenza Whole 10/24/2005, 11/07/2007, 12/01/2008, 11/30/2009   Influenza,inj,Quad PF,6+ Mos 12/31/2012, 09/30/2013, 11/30/2014, 11/14/2015, 12/12/2016, 11/26/2017, 11/10/2018, 10/22/2019   PFIZER(Purple Top)SARS-COV-2 Vaccination 05/14/2019, 06/09/2019, 12/07/2019   Pneumococcal Conjugate-13 07/03/2017   Pneumococcal Polysaccharide-23 03/14/2010, 11/14/2015   Tdap 06/19/2016    Past Medical History:  Diagnosis Date   Anxiety 11/2018   Asthma    Hepatitis C    HIV (human immunodeficiency virus infection) ([place])    Hypertension    STD (sexually transmitted disease)    Substance abuse ([place])    Underweight 07/29/2014    Tobacco History: Social History   Tobacco Use  Smoking Status Every Day   Packs/day: 0.50   Years: 33.00   Pack years: 16.50   Types: Cigarettes  Smokeless Tobacco Never  Tobacco Comments   cutting back   Ready to quit: Not  Answered Counseling given: Not Answered Tobacco comments: cutting back   Outpatient Encounter Medications as of 07/07/2021  Medication Sig   acetaminophen (TYLENOL) 500 MG tablet Take 1 tablet (500 mg total) by mouth every 6 (six) hours as needed. (Patient taking differently: Take 1,000 mg by mouth every 6 (six) hours as needed for moderate pain (abdominal pain).)   azithromycin (ZITHROMAX) 250 MG tablet Take 2 tablets on day 1, then 1 tablet daily on days 2 through 5   BIKTARVY 50-200-25 MG TABS tablet TAKE 1 TABLET BY MOUTH DAILY   cyclobenzaprine (FLEXERIL) 10 MG tablet Take 1 tablet (10 mg total) by mouth 3 (three) times daily as needed for muscle spasms.   Ensure (ENSURE) Take 1 Can by mouth 3 (three) times daily between meals.   mirtazapine (REMERON SOL-TAB) 45 MG disintegrating tablet Take 1 tablet (45 mg total) by mouth at bedtime.   predniSONE (DELTASONE) 20 MG tablet Take 1 tablet (20 mg total) by mouth daily with breakfast for 5 days.   sodium chloride (OCEAN) 0.65 % SOLN nasal spray Place 1 spray into both nostrils as needed for congestion.   [DISCONTINUED] amLODipine (NORVASC) 5 MG tablet TAKE 1 TABLET(5 MG) BY MOUTH DAILY   [DISCONTINUED] fluticasone (FLONASE) 50 MCG/ACT nasal spray Place 2 sprays into both nostrils daily.   amLODipine (NORVASC) 5 MG tablet TAKE 1 TABLET(5 MG) BY MOUTH DAILY   No facility-administered encounter medications on file as of 07/07/2021.     Review of Systems  Review of Systems  Constitutional: Negative.   HENT:  Positive for congestion, postnasal drip, sinus pressure, sinus pain, sneezing and sore throat.   Cardiovascular: Negative.   Gastrointestinal: Negative.   Allergic/Immunologic: Negative.   Neurological: Negative.   Psychiatric/Behavioral: Negative.        Physical Exam  Temp 97.9 F (36.6 C)   Ht 6' (1.829 m)   Wt 144 lb 4 oz (65.4 kg)   LMP 03/27/2011   BMI 19.56 kg/m   Wt Readings from Last 5 Encounters:  07/07/21 144  lb 4 oz (65.4 kg)  06/05/21 145 lb 3.2 oz (65.9 kg)  04/13/21 144 lb (65.3 kg)  11/28/20 143 lb (64.9 kg)  09/05/20 140 lb 0.4 oz (63.5 kg)     Physical Exam Vitals and nursing note reviewed.  Constitutional:      General: She is not in acute distress.    Appearance: She is well-developed.  HENT:     Nose: Nasal tenderness present.     Left Turbinates: Swollen.     Right Sinus: Maxillary sinus tenderness and frontal sinus tenderness present.     Left Sinus: Maxillary sinus tenderness and frontal sinus tenderness present.  Cardiovascular:     Rate and Rhythm: Normal rate and regular rhythm.  Pulmonary:     Effort: Pulmonary effort is normal.     Breath sounds: Normal breath sounds.  Neurological:     Mental Status: She is alert and oriented to person, place, and time.     Lab Results:  CBC    Component Value Date/Time   WBC 5.8 03/27/2021 1459   RBC 4.39 03/27/2021 1459   HGB 13.4 03/27/2021 1459   HGB 13.0 07/20/2019 1150   HCT 41.1 03/27/2021 1459   HCT 41.4 07/20/2019 1150   PLT 243 03/27/2021 1459   PLT 199 07/20/2019 1150   MCV 93.6 03/27/2021 1459   MCV 92 07/20/2019 1150   MCH 30.5 03/27/2021 1459   MCHC 32.6 03/27/2021 1459   RDW 11.8 03/27/2021 1459   RDW 11.9 07/20/2019 1150   LYMPHSABS 1.3 07/20/2019 1150   MONOABS 0.7 07/20/2018 1419   EOSABS 0.1 07/20/2019 1150   BASOSABS 0.0 07/20/2019 1150    BMET    Component Value Date/Time   NA 140 03/27/2021 1459   NA 142 07/20/2019 1150   K 4.4 03/27/2021 1459   CL 106 03/27/2021 1459   CO2 25 03/27/2021 1459   GLUCOSE 93 03/27/2021 1459   BUN 23 03/27/2021 1459   BUN 14 07/20/2019 1150   CREATININE 1.43 (H) 03/27/2021 1459   CALCIUM 9.6 03/27/2021 1459   GFRNONAA 64 07/20/2019 1150   GFRNONAA 60 05/31/2015 1726   GFRAA 74 07/20/2019 1150   GFRAA 69 05/31/2015 1726    BNP No results found for: BNP  ProBNP No results found for: PROBNP  Imaging: No results found.   Assessment & Plan:    Essential hypertension - amLODipine (NORVASC) 5 MG tablet; TAKE 1 TABLET(5 MG) BY MOUTH DAILY  Dispense: 90 tablet; Refill: 3  2. Acute non-recurrent maxillary sinusitis  - azithromycin (ZITHROMAX) 250 MG tablet; Take 2 tablets on day 1, then 1 tablet daily on days 2 through 5  Dispense: 6 tablet; Refill: 0 - predniSONE (DELTASONE) 20 MG tablet; Take 1 tablet (20 mg total) by mouth daily with breakfast for 5 days.  Dispense: 5 tablet; Refill: 0  Follow up:  Follow up 3 months     Fenton Foy, NP 07/07/2021

## 2021-07-07 NOTE — Patient Instructions (Addendum)
1. Essential hypertension  - amLODipine (NORVASC) 5 MG tablet; TAKE 1 TABLET(5 MG) BY MOUTH DAILY  Dispense: 90 tablet; Refill: 3  2. Acute non-recurrent maxillary sinusitis  - azithromycin (ZITHROMAX) 250 MG tablet; Take 2 tablets on day 1, then 1 tablet daily on days 2 through 5  Dispense: 6 tablet; Refill: 0 - predniSONE (DELTASONE) 20 MG tablet; Take 1 tablet (20 mg total) by mouth daily with breakfast for 5 days.  Dispense: 5 tablet; Refill: 0  Follow up:  Follow up 3 months

## 2021-07-24 ENCOUNTER — Encounter: Payer: Self-pay | Admitting: Infectious Diseases

## 2021-07-28 ENCOUNTER — Other Ambulatory Visit: Payer: Self-pay | Admitting: Infectious Diseases

## 2021-07-28 DIAGNOSIS — B20 Human immunodeficiency virus [HIV] disease: Secondary | ICD-10-CM

## 2021-08-01 ENCOUNTER — Ambulatory Visit (INDEPENDENT_AMBULATORY_CARE_PROVIDER_SITE_OTHER): Payer: Commercial Managed Care - HMO | Admitting: Obstetrics & Gynecology

## 2021-08-01 ENCOUNTER — Encounter: Payer: Self-pay | Admitting: Obstetrics & Gynecology

## 2021-08-01 VITALS — BP 141/84 | HR 74 | Ht 72.0 in | Wt 147.2 lb

## 2021-08-01 DIAGNOSIS — N814 Uterovaginal prolapse, unspecified: Secondary | ICD-10-CM | POA: Diagnosis not present

## 2021-08-01 NOTE — Progress Notes (Signed)
Patient ID: [name], female   DOB: May 21, 1960, 61 y.o.   MRN: [id number]  Chief Complaint  Patient presents with   New Patient (Initial Visit)    HPI [name] is a 61 y.o. female.  [id number] Patient's last menstrual period was 03/27/2011. She has a history of sx of pelvic prolapse and was diagnosed with second degree cystocele by Dr. Quincy Simmonds 07/2020. Due to financial issues she did not keep recommended f/u to arrange for surgery. Her sx persist and she wants surgical management. HPI  Past Medical History:  Diagnosis Date   Anxiety 11/2018   Asthma    Hepatitis C    HIV (human immunodeficiency virus infection) ([place])    Hypertension    STD (sexually transmitted disease)    Substance abuse ([place])    Underweight 07/29/2014    Past Surgical History:  Procedure Laterality Date   BUNIONECTOMY Left 03/15/2020   COLONOSCOPY WITH PROPOFOL N/A 06/26/2016   Procedure: COLONOSCOPY WITH PROPOFOL;  Surgeon: Otis Brace, MD;  [facility];  Service: Gastroenterology;  Laterality: N/A;   fractured wrist Left    LAPAROSCOPIC APPENDECTOMY N/A 07/20/2018   Procedure: APPENDECTOMY LAPAROSCOPIC;  Surgeon: Clovis Riley, MD;  Location: [facility];  Service: General;  Laterality: N/A;   PANCREATECTOMY      Family History  Problem Relation Age of Onset   Cancer Mother        bone   Diabetes Sister    Colon cancer Maternal Uncle    Esophageal cancer Neg Hx    Gastric cancer Neg Hx    Pancreatic cancer Neg Hx    Liver disease Neg Hx    Inflammatory bowel disease Neg Hx     Social History Social History   Tobacco Use   Smoking status: Every Day    Packs/day: 0.50    Years: 33.00    Total pack years: 16.50    Types: Cigarettes   Smokeless tobacco: Never   Tobacco comments:    cutting back  Vaping Use   Vaping Use: Never used  Substance Use Topics   Alcohol use: Yes    Alcohol/week: 5.0 standard drinks of alcohol    Types: 5 Cans of beer per week    Drug use: No    Comment: clean for 10 years    Allergies  Allergen Reactions   Aspirin Other (See Comments)    heart murmur    Current Outpatient Medications  Medication Sig Dispense Refill   acetaminophen (TYLENOL) 500 MG tablet Take 1 tablet (500 mg total) by mouth every 6 (six) hours as needed. (Patient taking differently: Take 1,000 mg by mouth every 6 (six) hours as needed for moderate pain (abdominal pain).) 30 tablet 0   amLODipine (NORVASC) 5 MG tablet TAKE 1 TABLET(5 MG) BY MOUTH DAILY 90 tablet 3   BIKTARVY 50-200-25 MG TABS tablet TAKE 1 TABLET BY MOUTH DAILY 30 tablet 1   cyclobenzaprine (FLEXERIL) 10 MG tablet Take 1 tablet (10 mg total) by mouth 3 (three) times daily as needed for muscle spasms. 30 tablet 0   Ensure (ENSURE) Take 1 Can by mouth 3 (three) times daily between meals. 240 mL 11   fluticasone (FLONASE) 50 MCG/ACT nasal spray Place 2 sprays into both nostrils daily. 16 g 6   mirtazapine (REMERON SOL-TAB) 45 MG disintegrating tablet Take 1 tablet (45 mg total) by mouth at bedtime. 90 tablet 3   sodium chloride (OCEAN) 0.65 % SOLN nasal spray Place 1  spray into both nostrils as needed for congestion. 480 mL 3   No current facility-administered medications for this visit.    Review of Systems Review of Systems  Constitutional: Negative.   Genitourinary:  Positive for frequency (SUI and urge incontinence) and vaginal pain (vaginal bulge). Negative for menstrual problem, pelvic pain, vaginal bleeding and vaginal discharge.    Blood pressure (!) 141/84, pulse 74, height 6' (1.829 m), weight 147 lb 3.2 oz (66.8 kg), last menstrual period 03/27/2011.  Physical Exam Physical Exam Vitals and nursing note reviewed.  Constitutional:      Appearance: Normal appearance.  Pulmonary:     Effort: Pulmonary effort is normal.  Neurological:     General: No focal deficit present.     Mental Status: She is alert.  Psychiatric:        Mood and Affect: Mood normal.         Behavior: Behavior normal.   Pelvic exam deferred at pt request  Data Reviewed Office notes  Assessment Cystocele with prolapse - Plan: Ambulatory referral to Obstetrics / Gynecology   Plan Dr. Sabra Heck will receive referral to evaluate for cystocele repair    Emeterio Reeve 08/01/2021, 3:55 PM

## 2021-08-01 NOTE — Progress Notes (Signed)
NGYN presents to establish care. Pt referred for uterine prolapse. Pt requesting information about hysterectomy.

## 2021-08-07 ENCOUNTER — Ambulatory Visit (INDEPENDENT_AMBULATORY_CARE_PROVIDER_SITE_OTHER): Payer: Managed Care, Other (non HMO) | Admitting: Nurse Practitioner

## 2021-08-07 ENCOUNTER — Encounter: Payer: Self-pay | Admitting: Nurse Practitioner

## 2021-08-07 VITALS — BP 99/82 | HR 77 | Temp 97.9°F | Ht 72.0 in | Wt 142.4 lb

## 2021-08-07 DIAGNOSIS — R519 Headache, unspecified: Secondary | ICD-10-CM | POA: Diagnosis not present

## 2021-08-07 DIAGNOSIS — I10 Essential (primary) hypertension: Secondary | ICD-10-CM

## 2021-08-07 MED ORDER — TIZANIDINE HCL 4 MG PO TABS: 4.0000 mg | ORAL_TABLET | Freq: Four times a day (QID) | ORAL | 0 refills | Status: DC | PRN

## 2021-08-07 MED ORDER — AMLODIPINE BESYLATE 5 MG PO TABS: ORAL_TABLET | ORAL | 3 refills | Status: DC

## 2021-08-07 MED ORDER — CETIRIZINE HCL 10 MG PO TABS: 10.0000 mg | ORAL_TABLET | Freq: Every day | ORAL | 11 refills | Status: DC

## 2021-08-07 MED ORDER — IBUPROFEN 600 MG PO TABS: 600.0000 mg | ORAL_TABLET | Freq: Three times a day (TID) | ORAL | 0 refills | Status: DC | PRN

## 2021-08-07 NOTE — Progress Notes (Signed)
$'@Patient'L$  ID: [name], female    DOB: Nov 15, 1960, 61 y.o.   MRN: [id number]  Chief Complaint  Patient presents with   Follow-up    Pt is here for 3 months BP follow up. Pt states she has migraines for the past 3 week's    Referring provider: Vevelyn Francois, NP   HPI  Patient presents today for headaches.  She is concerned about her blood pressure but blood pressure is normal in office today.  Patient was seen on 07/07/2021 for sinus infection.  She states that her headaches are still present through frontal and maxillary sinus regions.  She states that these headaches are intermittent but are severe at times. Denies f/c/s, n/v/d, hemoptysis, PND, leg swelling Denies chest pain or edema         Allergies  Allergen Reactions   Aspirin Other (See Comments)    heart murmur    Immunization History  Administered Date(s) Administered   Hepatitis A 03/06/2002   Hepatitis B 01/05/2003, 02/04/2003, 07/01/2003   Influenza Split 12/01/2010, 12/05/2011   Influenza Whole 10/24/2005, 11/07/2007, 12/01/2008, 11/30/2009   Influenza,inj,Quad PF,6+ Mos 12/31/2012, 09/30/2013, 11/30/2014, 11/14/2015, 12/12/2016, 11/26/2017, 11/10/2018, 10/22/2019   PFIZER(Purple Top)SARS-COV-2 Vaccination 05/14/2019, 06/09/2019, 12/07/2019   Pneumococcal Conjugate-13 07/03/2017   Pneumococcal Polysaccharide-23 03/14/2010, 11/14/2015   Tdap 06/19/2016    Past Medical History:  Diagnosis Date   Anxiety 11/2018   Asthma    Hepatitis C    HIV (human immunodeficiency virus infection) ([place])    Hypertension    STD (sexually transmitted disease)    Substance abuse ([place])    Underweight 07/29/2014    Tobacco History: Social History   Tobacco Use  Smoking Status Every Day   Packs/day: 0.50   Years: 33.00   Total pack years: 16.50   Types: Cigarettes  Smokeless Tobacco Never  Tobacco Comments   cutting back   Ready to quit: Not Answered Counseling given: Not Answered Tobacco comments:  cutting back   Outpatient Encounter Medications as of 08/07/2021  Medication Sig   acetaminophen (TYLENOL) 500 MG tablet Take 1 tablet (500 mg total) by mouth every 6 (six) hours as needed. (Patient taking differently: Take 1,000 mg by mouth every 6 (six) hours as needed for moderate pain (abdominal pain).)   BIKTARVY 50-200-25 MG TABS tablet TAKE 1 TABLET BY MOUTH DAILY   cetirizine (ZYRTEC) 10 MG tablet Take 1 tablet (10 mg total) by mouth daily.   Ensure (ENSURE) Take 1 Can by mouth 3 (three) times daily between meals.   fluticasone (FLONASE) 50 MCG/ACT nasal spray Place 2 sprays into both nostrils daily.   ibuprofen (ADVIL) 600 MG tablet Take 1 tablet (600 mg total) by mouth every 8 (eight) hours as needed.   mirtazapine (REMERON SOL-TAB) 45 MG disintegrating tablet Take 1 tablet (45 mg total) by mouth at bedtime.   tiZANidine (ZANAFLEX) 4 MG tablet Take 1 tablet (4 mg total) by mouth every 6 (six) hours as needed for muscle spasms.   [DISCONTINUED] amLODipine (NORVASC) 5 MG tablet TAKE 1 TABLET(5 MG) BY MOUTH DAILY   amLODipine (NORVASC) 5 MG tablet TAKE 1 TABLET(5 MG) BY MOUTH DAILY   [EXPIRED] azithromycin (ZITHROMAX) 250 MG tablet Take 2 tablets on day 1, then 1 tablet daily on days 2 through 5   cyclobenzaprine (FLEXERIL) 10 MG tablet Take 1 tablet (10 mg total) by mouth 3 (three) times daily as needed for muscle spasms. (Patient not taking: Reported on 08/07/2021)   [EXPIRED] predniSONE (DELTASONE)  20 MG tablet Take 1 tablet (20 mg total) by mouth daily with breakfast for 5 days.   sodium chloride (OCEAN) 0.65 % SOLN nasal spray Place 1 spray into both nostrils as needed for congestion. (Patient not taking: Reported on 08/07/2021)   [DISCONTINUED] BIKTARVY 50-200-25 MG TABS tablet TAKE 1 TABLET BY MOUTH DAILY   No facility-administered encounter medications on file as of 08/07/2021.     Review of Systems  Review of Systems  Constitutional: Negative.   HENT: Negative.     Cardiovascular: Negative.   Gastrointestinal: Negative.   Allergic/Immunologic: Negative.   Neurological:  Positive for headaches.  Psychiatric/Behavioral: Negative.         Physical Exam  BP 99/82 (BP Location: Left Arm, Patient Position: Sitting, Cuff Size: Normal)   Pulse 77   Temp 97.9 F (36.6 C)   Ht 6' (1.829 m)   Wt 142 lb 6 oz (64.6 kg)   LMP 03/27/2011   SpO2 100%   BMI 19.31 kg/m   Wt Readings from Last 5 Encounters:  08/07/21 142 lb 6 oz (64.6 kg)  08/01/21 147 lb 3.2 oz (66.8 kg)  07/07/21 144 lb 4 oz (65.4 kg)  06/05/21 145 lb 3.2 oz (65.9 kg)  04/13/21 144 lb (65.3 kg)     Physical Exam Vitals and nursing note reviewed.  Constitutional:      General: She is not in acute distress.    Appearance: She is well-developed.  Cardiovascular:     Rate and Rhythm: Normal rate and regular rhythm.  Pulmonary:     Effort: Pulmonary effort is normal.     Breath sounds: Normal breath sounds.  Neurological:     Mental Status: She is alert and oriented to person, place, and time.      Lab Results:  CBC    Component Value Date/Time   WBC 5.8 03/27/2021 1459   RBC 4.39 03/27/2021 1459   HGB 13.4 03/27/2021 1459   HGB 13.0 07/20/2019 1150   HCT 41.1 03/27/2021 1459   HCT 41.4 07/20/2019 1150   PLT 243 03/27/2021 1459   PLT 199 07/20/2019 1150   MCV 93.6 03/27/2021 1459   MCV 92 07/20/2019 1150   MCH 30.5 03/27/2021 1459   MCHC 32.6 03/27/2021 1459   RDW 11.8 03/27/2021 1459   RDW 11.9 07/20/2019 1150   LYMPHSABS 1.3 07/20/2019 1150   MONOABS 0.7 07/20/2018 1419   EOSABS 0.1 07/20/2019 1150   BASOSABS 0.0 07/20/2019 1150    BMET    Component Value Date/Time   NA 140 03/27/2021 1459   NA 142 07/20/2019 1150   K 4.4 03/27/2021 1459   CL 106 03/27/2021 1459   CO2 25 03/27/2021 1459   GLUCOSE 93 03/27/2021 1459   BUN 23 03/27/2021 1459   BUN 14 07/20/2019 1150   CREATININE 1.43 (H) 03/27/2021 1459   CALCIUM 9.6 03/27/2021 1459   GFRNONAA 64  07/20/2019 1150   GFRNONAA 60 05/31/2015 1726   GFRAA 74 07/20/2019 1150   GFRAA 69 05/31/2015 1726    BNP No results found for: "BNP"  ProBNP No results found for: "PROBNP"  Imaging: No results found.   Assessment & Plan:   Nonintractable headache - cetirizine (ZYRTEC) 10 MG tablet; Take 1 tablet (10 mg total) by mouth daily.  Dispense: 30 tablet; Refill: 11 - ibuprofen (ADVIL) 600 MG tablet; Take 1 tablet (600 mg total) by mouth every 8 (eight) hours as needed.  Dispense: 30 tablet; Refill: 0 - tiZANidine (  ZANAFLEX) 4 MG tablet; Take 1 tablet (4 mg total) by mouth every 6 (six) hours as needed for muscle spasms.  Dispense: 30 tablet; Refill: 0   May try magnesium - OTC   Follow up:  Follow up in 3 months or sooner     Fenton Foy, NP 08/10/2021

## 2021-08-10 ENCOUNTER — Encounter: Payer: Self-pay | Admitting: Nurse Practitioner

## 2021-08-10 DIAGNOSIS — R519 Headache, unspecified: Secondary | ICD-10-CM | POA: Insufficient documentation

## 2021-08-10 NOTE — Assessment & Plan Note (Signed)
-   cetirizine (ZYRTEC) 10 MG tablet; Take 1 tablet (10 mg total) by mouth daily.  Dispense: 30 tablet; Refill: 11 - ibuprofen (ADVIL) 600 MG tablet; Take 1 tablet (600 mg total) by mouth every 8 (eight) hours as needed.  Dispense: 30 tablet; Refill: 0 - tiZANidine (ZANAFLEX) 4 MG tablet; Take 1 tablet (4 mg total) by mouth every 6 (six) hours as needed for muscle spasms.  Dispense: 30 tablet; Refill: 0   May try magnesium - OTC   Follow up:  Follow up in 3 months or sooner

## 2021-08-13 ENCOUNTER — Other Ambulatory Visit: Payer: Self-pay | Admitting: Nurse Practitioner

## 2021-08-13 DIAGNOSIS — R519 Headache, unspecified: Secondary | ICD-10-CM

## 2021-08-14 ENCOUNTER — Other Ambulatory Visit: Payer: Self-pay

## 2021-08-14 ENCOUNTER — Ambulatory Visit: Payer: Self-pay

## 2021-08-17 ENCOUNTER — Other Ambulatory Visit: Payer: Self-pay

## 2021-08-17 DIAGNOSIS — R519 Headache, unspecified: Secondary | ICD-10-CM

## 2021-08-17 MED ORDER — TIZANIDINE HCL 4 MG PO TABS: 4.0000 mg | ORAL_TABLET | Freq: Three times a day (TID) | ORAL | 0 refills | Status: AC | PRN

## 2021-08-17 NOTE — Telephone Encounter (Signed)
RF request for Tizanidine LOV: 08/07/21 Next ov: 01/25/22 Last written: 08/07/21(30,0)  Please review and advise. Med pending

## 2021-08-21 ENCOUNTER — Other Ambulatory Visit: Payer: Self-pay | Admitting: Obstetrics & Gynecology

## 2021-08-21 DIAGNOSIS — N814 Uterovaginal prolapse, unspecified: Secondary | ICD-10-CM

## 2021-08-25 ENCOUNTER — Encounter: Payer: Self-pay | Admitting: Internal Medicine

## 2021-09-06 ENCOUNTER — Other Ambulatory Visit: Payer: Self-pay | Admitting: Nurse Practitioner

## 2021-09-06 DIAGNOSIS — R519 Headache, unspecified: Secondary | ICD-10-CM

## 2021-09-06 MED ORDER — IBUPROFEN 600 MG PO TABS: 600.0000 mg | ORAL_TABLET | Freq: Three times a day (TID) | ORAL | 0 refills | Status: DC | PRN

## 2021-11-02 ENCOUNTER — Other Ambulatory Visit (HOSPITAL_COMMUNITY)
Admission: RE | Admit: 2021-11-02 | Discharge: 2021-11-02 | Disposition: A | Discharge status: Home / Self Care | Payer: Commercial Managed Care - HMO | Source: Ambulatory Visit | Attending: Internal Medicine | Admitting: Internal Medicine

## 2021-11-02 ENCOUNTER — Other Ambulatory Visit: Payer: Self-pay

## 2021-11-02 ENCOUNTER — Other Ambulatory Visit: Payer: Commercial Managed Care - HMO

## 2021-11-02 ENCOUNTER — Telehealth: Payer: Self-pay | Admitting: Infectious Diseases

## 2021-11-02 DIAGNOSIS — Z113 Encounter for screening for infections with a predominantly sexual mode of transmission: Secondary | ICD-10-CM

## 2021-11-02 DIAGNOSIS — B20 Human immunodeficiency virus [HIV] disease: Secondary | ICD-10-CM

## 2021-11-03 LAB — T-HELPER CELL (CD4) - (RCID CLINIC ONLY)
CD4 % Helper T Cell: 29 % — ABNORMAL LOW (ref 33–65)
CD4 T Cell Abs: 413 /uL (ref 400–1790)

## 2021-11-03 LAB — URINE CYTOLOGY ANCILLARY ONLY
Chlamydia: NEGATIVE
Comment: NEGATIVE
Comment: NORMAL
Neisseria Gonorrhea: NEGATIVE

## 2021-11-08 LAB — COMPLETE METABOLIC PANEL WITH GFR
AG Ratio: 1.6 (calc) (ref 1.0–2.5)
ALT: 10 U/L (ref 6–29)
AST: 18 U/L (ref 10–35)
Albumin: 4.6 g/dL (ref 3.6–5.1)
Alkaline phosphatase (APISO): 130 U/L (ref 37–153)
BUN/Creatinine Ratio: 14 (calc) (ref 6–22)
BUN: 16 mg/dL (ref 7–25)
CO2: 29 mmol/L (ref 20–32)
Calcium: 9.8 mg/dL (ref 8.6–10.4)
Chloride: 104 mmol/L (ref 98–110)
Creat: 1.11 mg/dL — ABNORMAL HIGH (ref 0.50–1.05)
Globulin: 2.8 g/dL (calc) (ref 1.9–3.7)
Glucose, Bld: 89 mg/dL (ref 65–99)
Potassium: 4.6 mmol/L (ref 3.5–5.3)
Sodium: 139 mmol/L (ref 135–146)
Total Bilirubin: 0.4 mg/dL (ref 0.2–1.2)
Total Protein: 7.4 g/dL (ref 6.1–8.1)
eGFR: 57 mL/min/{1.73_m2} — ABNORMAL LOW (ref >=60)

## 2021-11-08 LAB — CBC WITH DIFFERENTIAL/PLATELET
Absolute Monocytes: 372 cells/uL (ref 200–950)
Basophils Absolute: 12 cells/uL (ref 0–200)
Basophils Relative: 0.3 %
Eosinophils Absolute: 100 cells/uL (ref 15–500)
Eosinophils Relative: 2.5 %
HCT: 40.7 % (ref 35.0–45.0)
Hemoglobin: 13.5 g/dL (ref 11.7–15.5)
Lymphs Abs: 1468 cells/uL (ref 850–3900)
MCH: 31.3 pg (ref 27.0–33.0)
MCHC: 33.2 g/dL (ref 32.0–36.0)
MCV: 94.4 fL (ref 80.0–100.0)
MPV: 10.7 fL (ref 7.5–12.5)
Monocytes Relative: 9.3 %
Neutro Abs: 2048 cells/uL (ref 1500–7800)
Neutrophils Relative %: 51.2 %
Platelets: 217 10*3/uL (ref 140–400)
RBC: 4.31 10*6/uL (ref 3.80–5.10)
RDW: 11.8 % (ref 11.0–15.0)
Total Lymphocyte: 36.7 %
WBC: 4 10*3/uL (ref 3.8–10.8)

## 2021-11-08 LAB — HIV-1 RNA QUANT-NO REFLEX-BLD
HIV 1 RNA Quant: NOT DETECTED Copies/mL
HIV-1 RNA Quant, Log: NOT DETECTED Log cps/mL

## 2021-11-08 LAB — RPR TITER: RPR Titer: 1:1 {titer} — ABNORMAL HIGH

## 2021-11-08 LAB — LIPID PANEL
Cholesterol: 157 mg/dL (ref <200)
HDL: 66 mg/dL (ref >=50)
LDL Cholesterol (Calc): 75 mg/dL (calc)
Non-HDL Cholesterol (Calc): 91 mg/dL (calc) (ref <130)
Total CHOL/HDL Ratio: 2.4 (calc) (ref <5.0)
Triglycerides: 80 mg/dL (ref <150)

## 2021-11-08 LAB — FLUORESCENT TREPONEMAL AB(FTA)-IGG-BLD: Fluorescent Treponemal ABS: NONREACTIVE

## 2021-11-08 LAB — RPR: RPR Ser Ql: REACTIVE — AB

## 2021-11-09 ENCOUNTER — Other Ambulatory Visit: Payer: Self-pay | Admitting: Infectious Diseases

## 2021-11-09 DIAGNOSIS — B20 Human immunodeficiency virus [HIV] disease: Secondary | ICD-10-CM

## 2021-11-09 NOTE — Telephone Encounter (Signed)
Called to pt about f/u appt

## 2021-11-14 ENCOUNTER — Other Ambulatory Visit: Payer: Self-pay | Admitting: Internal Medicine

## 2021-11-14 DIAGNOSIS — Z1231 Encounter for screening mammogram for malignant neoplasm of breast: Secondary | ICD-10-CM

## 2021-11-16 ENCOUNTER — Ambulatory Visit (INDEPENDENT_AMBULATORY_CARE_PROVIDER_SITE_OTHER): Payer: Commercial Managed Care - HMO | Admitting: Internal Medicine

## 2021-11-16 ENCOUNTER — Encounter: Payer: Self-pay | Admitting: Internal Medicine

## 2021-11-16 ENCOUNTER — Other Ambulatory Visit: Payer: Self-pay

## 2021-11-16 VITALS — BP 127/87 | HR 69 | Resp 16 | Ht 72.0 in | Wt 142.8 lb

## 2021-11-16 DIAGNOSIS — Z23 Encounter for immunization: Secondary | ICD-10-CM | POA: Diagnosis not present

## 2021-11-16 DIAGNOSIS — B20 Human immunodeficiency virus [HIV] disease: Secondary | ICD-10-CM

## 2021-11-16 DIAGNOSIS — F419 Anxiety disorder, unspecified: Secondary | ICD-10-CM

## 2021-11-16 DIAGNOSIS — R634 Abnormal weight loss: Secondary | ICD-10-CM | POA: Diagnosis not present

## 2021-11-16 MED ORDER — BIKTARVY 50-200-25 MG PO TABS: 1.0000 | ORAL_TABLET | Freq: Every day | ORAL | 11 refills | Status: DC

## 2021-11-16 MED ORDER — MIRTAZAPINE 7.5 MG PO TABS: 7.5000 mg | ORAL_TABLET | Freq: Every day | ORAL | 3 refills | Status: DC

## 2021-11-16 MED ORDER — FLUOXETINE HCL 20 MG PO CAPS: 20.0000 mg | ORAL_CAPSULE | Freq: Every day | ORAL | 3 refills | Status: DC

## 2021-11-16 NOTE — Progress Notes (Signed)
RFV: follow up on hiv disease, annual visit  Patient ID: [name], female   DOB: May 15, 1960, 61 y.o.   MRN: [id number]  HPI Kailly is a 61yo F with well controlled hiv disease, CD 4 count of 413 (29%)/VL<20 (Sep 2023), hx of syphilis with RPR 1:2, hx of GERD, chronic hep C (GT1a, tx in 01/2014). Currently on biktarvy.  For gerd she takes tums -  Feels like she needs something for anxiety. Stress at work, occ palpitations. Has had fluoxetin in the past that helped   ART hx includes: biktarvy (10/20), odefsey, atripla, nelfinavir  Health maintenance: colonoscopy 06-2016 (single tubular adenoma, hemorrhoids). Next due in 2028     Outpatient Encounter Medications as of 11/16/2021  Medication Sig   acetaminophen (TYLENOL) 500 MG tablet Take 1 tablet (500 mg total) by mouth every 6 (six) hours as needed. (Patient taking differently: Take 1,000 mg by mouth every 6 (six) hours as needed for moderate pain (abdominal pain).)   amLODipine (NORVASC) 5 MG tablet TAKE 1 TABLET(5 MG) BY MOUTH DAILY   BIKTARVY 50-200-25 MG TABS tablet TAKE 1 TABLET BY MOUTH DAILY   cetirizine (ZYRTEC) 10 MG tablet Take 1 tablet (10 mg total) by mouth daily.   Ensure (ENSURE) Take 1 Can by mouth 3 (three) times daily between meals.   fluticasone (FLONASE) 50 MCG/ACT nasal spray Place 2 sprays into both nostrils daily.   ibuprofen (ADVIL) 600 MG tablet Take 1 tablet (600 mg total) by mouth every 8 (eight) hours as needed.   mirtazapine (REMERON SOL-TAB) 45 MG disintegrating tablet Take 1 tablet (45 mg total) by mouth at bedtime.   sodium chloride (OCEAN) 0.65 % SOLN nasal spray Place 1 spray into both nostrils as needed for congestion. (Patient not taking: Reported on 08/07/2021)   No facility-administered encounter medications on file as of 11/16/2021.     Patient Active Problem List   Diagnosis Date Noted   Nonintractable headache 08/10/2021   Cystocele with prolapse 07/21/2020   Sinusitis  03/02/2019   Callus of foot 12/12/2016   Multiple renal cysts 08/14/2016   Constipation 11/30/2014   Essential hypertension 11/30/2014   Hot flashes, menopausal 01/27/2014   Asthma 01/23/2011   HEADACHE 04/14/2008   CHOLELITHIASIS 12/15/2007   PERIPHERAL NEUROPATHY 11/07/2007   Avulsion fracture of tooth 07/23/2007   Depression 07/24/2006   Human immunodeficiency virus (HIV) disease ([place]) 12/14/2005   MACROCYTIC ANEMIA 12/14/2005   TOBACCO ABUSE 12/14/2005   SUBSTANCE ABUSE 12/14/2005   GERD 12/14/2005   PNEUMONIA, HX OF 12/14/2005   COLPOSCOPY, HX OF 12/14/2005     Health Maintenance Due  Topic Date Due   Zoster Vaccines- Shingrix (1 of 2) Never done   PAP SMEAR-Modifier  08/07/2018   COVID-19 Vaccine (4 - Pfizer risk series) 02/01/2020   INFLUENZA VACCINE  09/12/2021     Review of Systems Review of Systems  Constitutional: Negative for fever, chills, diaphoresis, activity change, appetite change, fatigue and unexpected weight change.  HENT: Negative for congestion, sore throat, rhinorrhea, sneezing, trouble swallowing and sinus pressure.  Eyes: Negative for photophobia and visual disturbance.  Respiratory: Negative for cough, chest tightness, shortness of breath, wheezing and stridor.  Cardiovascular: Negative for chest pain, palpitations and leg swelling.  Gastrointestinal: Negative for nausea, vomiting, abdominal pain, diarrhea, constipation, blood in stool, abdominal distention and anal bleeding.  Genitourinary: Negative for dysuria, hematuria, flank pain and difficulty urinating.  Musculoskeletal: Negative for myalgias, back pain, joint swelling, arthralgias and gait problem.  Skin: Negative for color change, pallor, rash and wound.  Neurological: Negative for dizziness, tremors, weakness and light-headedness.  Hematological: Negative for adenopathy. Does not bruise/bleed easily.  Psychiatric/Behavioral: Negative for behavioral problems, confusion, sleep disturbance,  dysphoric mood, decreased concentration and agitation.   Physical Exam   Resp 16   Wt 142 lb 12.8 oz (64.8 kg)   LMP 03/27/2011   BMI 19.37 kg/m   Physical Exam  Constitutional:  oriented to person, place, and time. appears well-developed and well-nourished. No distress.  HENT: /AT, PERRLA, no scleral icterus Mouth/Throat: Oropharynx is clear and moist. No oropharyngeal exudate.  Cardiovascular: Normal rate, regular rhythm and normal heart sounds. Exam reveals no gallop and no friction rub.  No murmur heard.  Pulmonary/Chest: Effort normal and breath sounds normal. No respiratory distress.  has no wheezes.  Neck = supple, no nuchal rigidity Abdominal: Soft. Bowel sounds are normal.  exhibits no distension. There is no tenderness.  Lymphadenopathy: no cervical adenopathy. No axillary adenopathy Neurological: alert and oriented to person, place, and time.  Skin: Skin is warm and dry. No rash noted. No erythema.  Psychiatric: a normal mood and affect.  behavior is normal.   Lab Results  Component Value Date   CD4TCELL 29 (L) 11/02/2021   Lab Results  Component Value Date   CD4TABS 413 11/02/2021   CD4TABS 444 07/07/2020   CD4TABS 419 10/08/2019   Lab Results  Component Value Date   HIV1RNAQUANT Not Detected 11/02/2021   Lab Results  Component Value Date   HEPBSAB YES 04/08/2006   Lab Results  Component Value Date   LABRPR REACTIVE (A) 11/02/2021    CBC Lab Results  Component Value Date   WBC 4.0 11/02/2021   RBC 4.31 11/02/2021   HGB 13.5 11/02/2021   HCT 40.7 11/02/2021   PLT 217 11/02/2021   MCV 94.4 11/02/2021   MCH 31.3 11/02/2021   MCHC 33.2 11/02/2021   RDW 11.8 11/02/2021   LYMPHSABS 1,468 11/02/2021   MONOABS 0.7 07/20/2018   EOSABS 100 11/02/2021    BMET Lab Results  Component Value Date   NA 139 11/02/2021   K 4.6 11/02/2021   CL 104 11/02/2021   CO2 29 11/02/2021   GLUCOSE 89 11/02/2021   BUN 16 11/02/2021   CREATININE 1.11 (H)  11/02/2021   CALCIUM 9.8 11/02/2021   GFRNONAA 64 07/20/2019   GFRAA 74 07/20/2019      Assessment and Plan  HIV disease = continue on biktarvy  Anxiety = will start on fluoxetine to see if improving and   Weight loss/ appetite stimulant = restart remeron to help with appetite; continue on ensure  No cholesterol issues on recent lipid profile  Mammo appt coming up this month but also gyn  visit  in December.

## 2021-12-07 ENCOUNTER — Ambulatory Visit: Payer: Commercial Managed Care - HMO

## 2021-12-14 ENCOUNTER — Ambulatory Visit (INDEPENDENT_AMBULATORY_CARE_PROVIDER_SITE_OTHER): Payer: Commercial Managed Care - HMO

## 2021-12-14 ENCOUNTER — Other Ambulatory Visit: Payer: Self-pay

## 2021-12-14 DIAGNOSIS — Z23 Encounter for immunization: Secondary | ICD-10-CM | POA: Diagnosis not present

## 2022-01-16 ENCOUNTER — Ambulatory Visit: Payer: Commercial Managed Care - HMO

## 2022-01-16 IMAGING — MG DIGITAL SCREENING BILAT W/ TOMO W/ CAD
8 series · 9 of 24 positions shown · non-contrast
Comparison: Previous exam(s).

CLINICAL DATA: Screening.

EXAM:
DIGITAL SCREENING BILATERAL MAMMOGRAM WITH TOMO AND CAD

[R CC synth-2D]
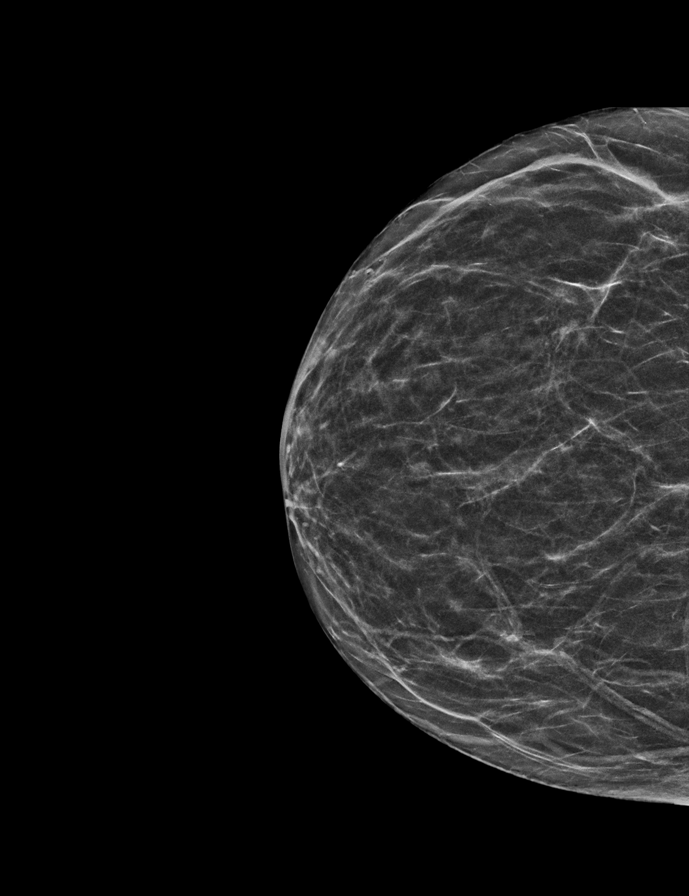

[L CC synth-2D]
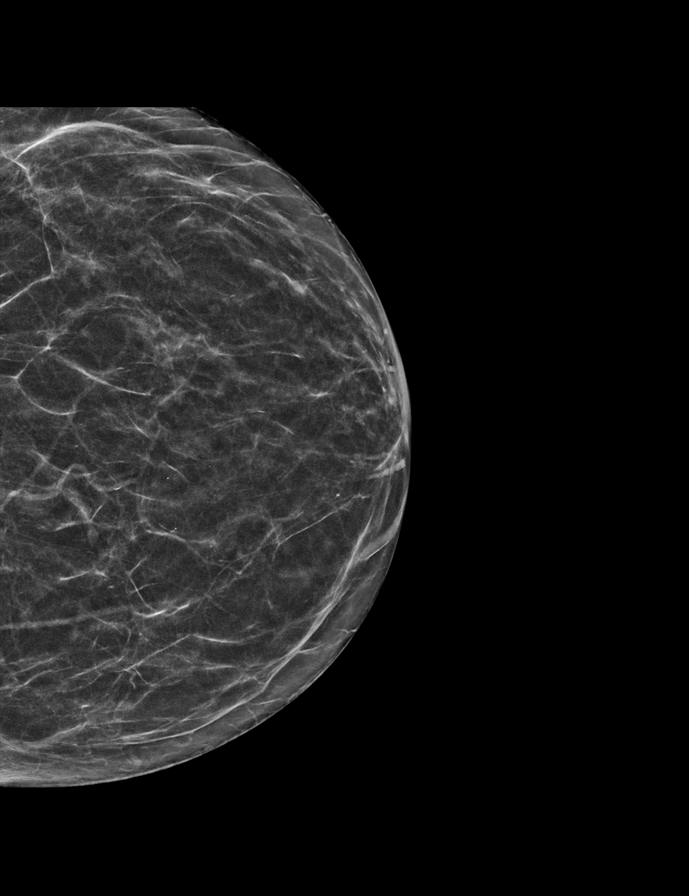

[L MLO synth-2D]
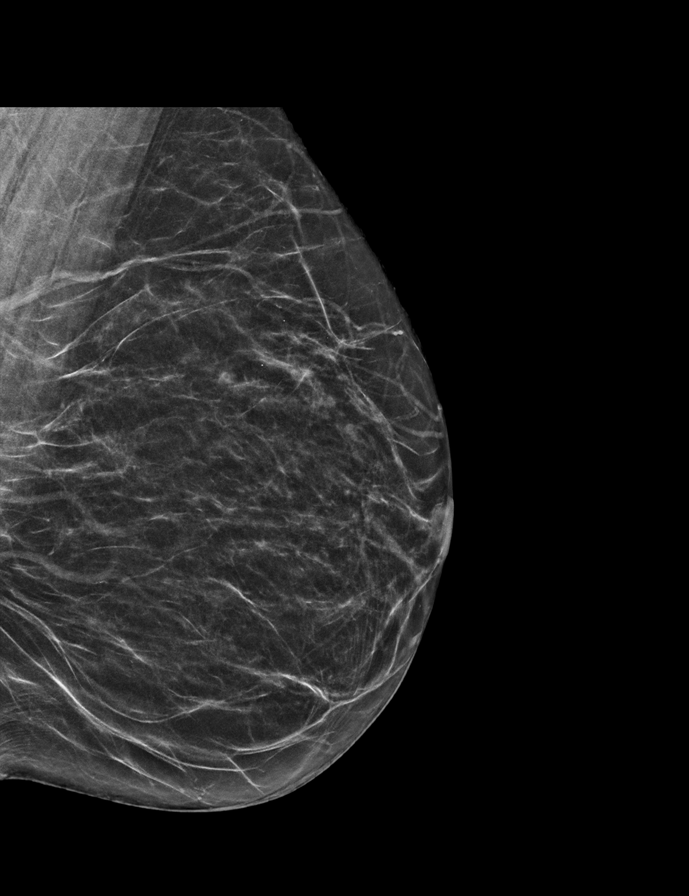

[R MLO synth-2D]
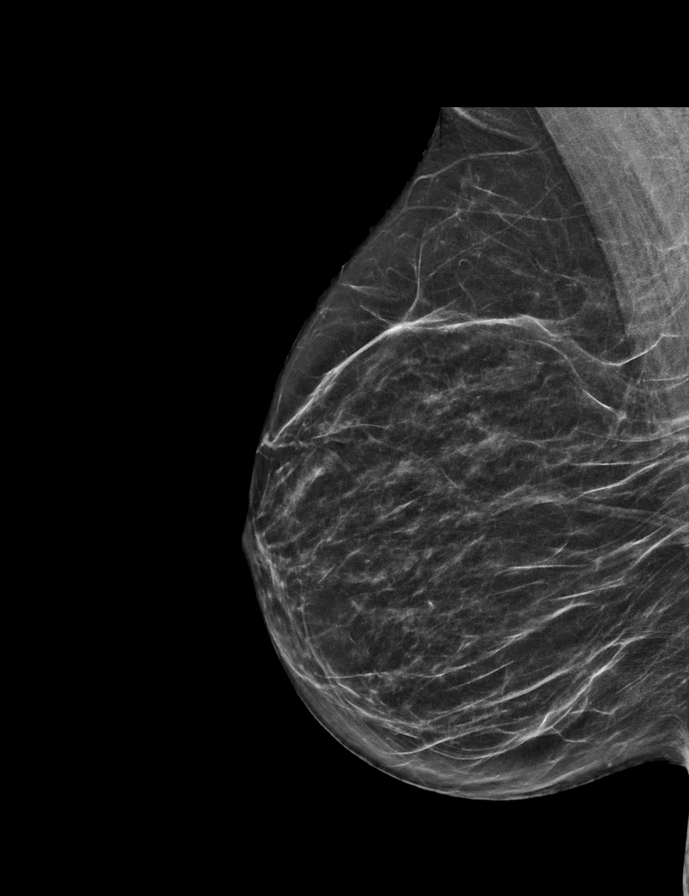

[R MLO tomo · 2 of 52 frames shown]
[frame 17/52]
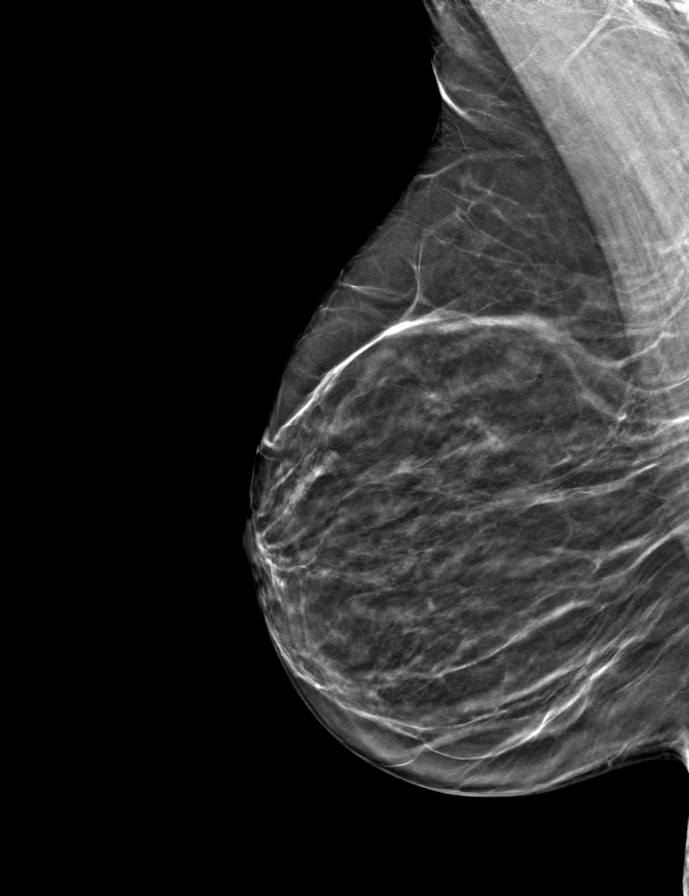
[frame 27/52]
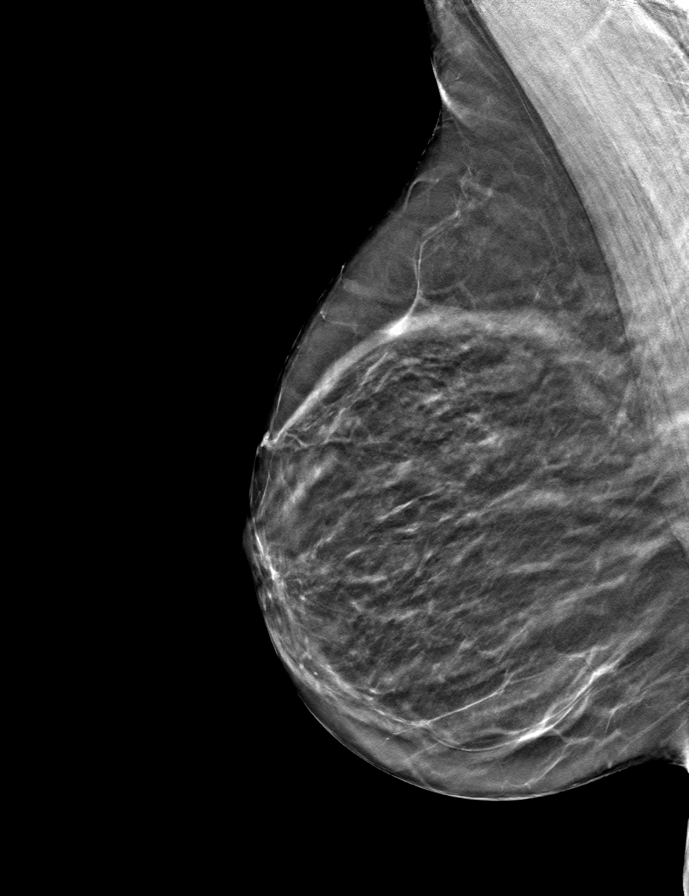

[L MLO tomo · tomo slice 27/52.0]
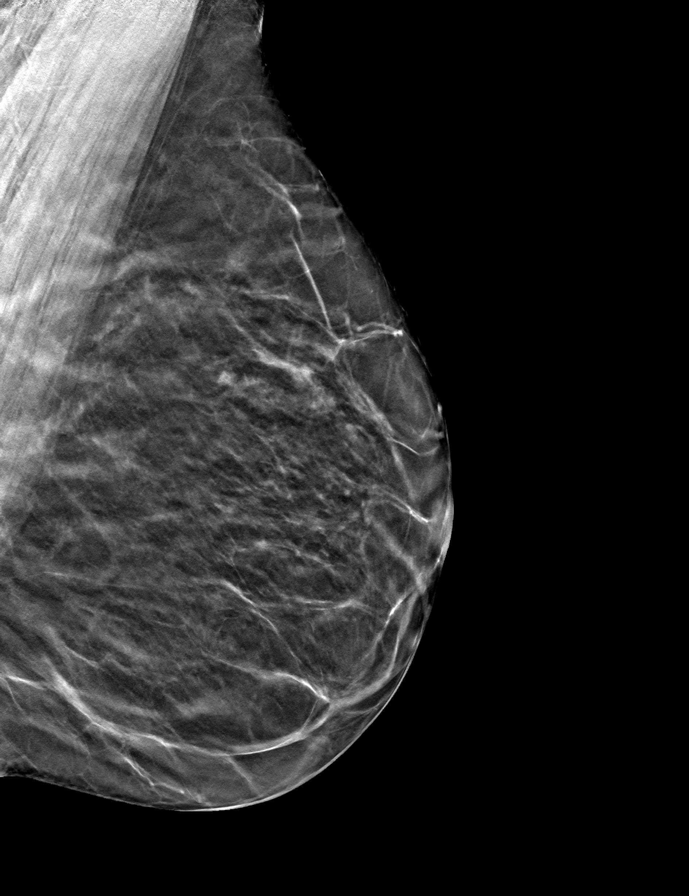

[L CC tomo · tomo slice 25/49.0]
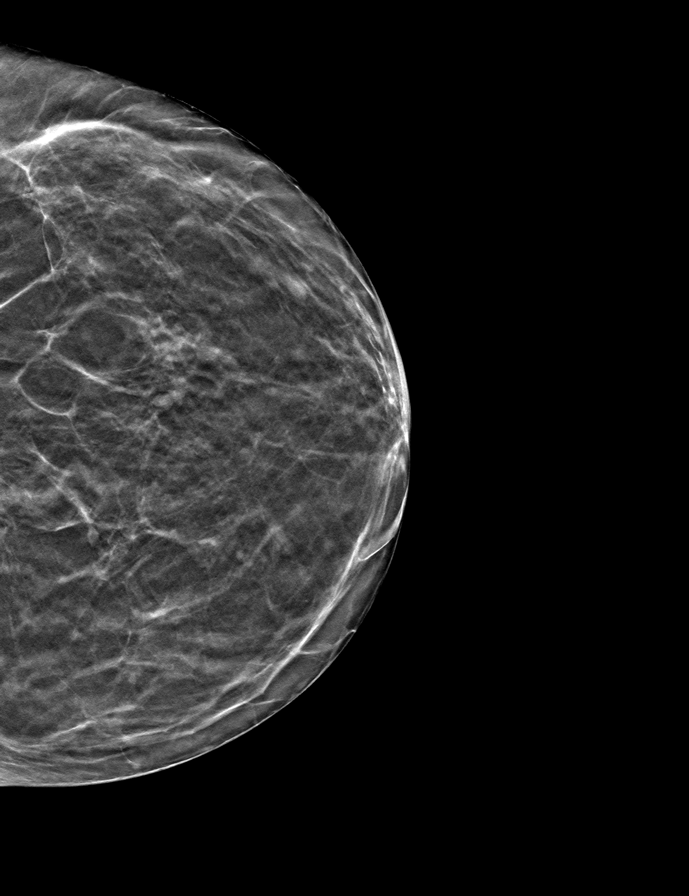

[R CC tomo · tomo slice 24/47.0]
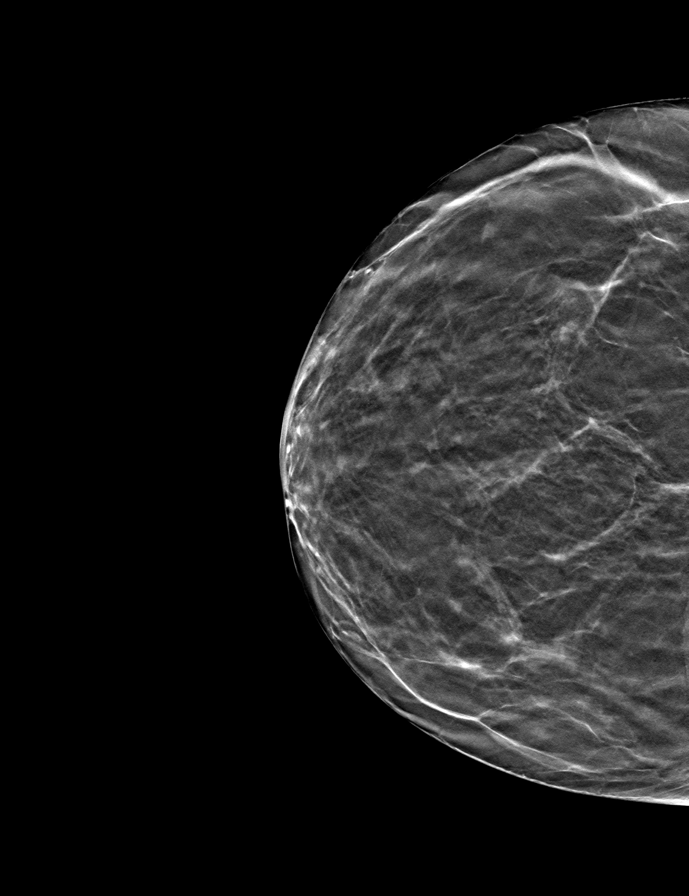

[9 of 24 positions shown; findings below may reference images not displayed]

ACR Breast Density Category b: There are scattered areas of
fibroglandular density.
FINDINGS: There are no findings suspicious for malignancy. Images were
processed with CAD.
IMPRESSION: No mammographic evidence of malignancy. A result letter of this
screening mammogram will be mailed directly to the patient.

RECOMMENDATION:
Screening mammogram in one year. (Code:CN-U-775)

BI-RADS CATEGORY  1: Negative.

## 2022-01-25 ENCOUNTER — Encounter: Payer: Self-pay | Admitting: Nurse Practitioner

## 2022-01-25 ENCOUNTER — Ambulatory Visit (INDEPENDENT_AMBULATORY_CARE_PROVIDER_SITE_OTHER): Payer: Commercial Managed Care - HMO | Admitting: Nurse Practitioner

## 2022-01-25 DIAGNOSIS — I10 Essential (primary) hypertension: Secondary | ICD-10-CM

## 2022-01-25 MED ORDER — AMLODIPINE BESYLATE 5 MG PO TABS: ORAL_TABLET | ORAL | 3 refills | Status: DC

## 2022-01-25 NOTE — Progress Notes (Signed)
$'@Patient'O$  ID: [name], female    DOB: 07/01/1960, 61 y.o.   MRN: [id number]  Chief Complaint  Patient presents with   Follow-up    Referring provider: Fenton Foy, NP   HPI  61 year old female with history of HIV, syphilis, GERD hep C.  Patient presents for a follow-up on blood pressure.  She is trying to follow a low-salt diet.  She is staying active.  Overall no new issues or concerns today. Denies f/c/s, n/v/d, hemoptysis, PND, leg swelling Denies chest pain or edema      Allergies  Allergen Reactions   Aspirin Other (See Comments)    heart murmur    Immunization History  Administered Date(s) Administered   Hepatitis A 03/06/2002   Hepatitis B 01/05/2003, 02/04/2003, 07/01/2003   Influenza Split 12/01/2010, 12/05/2011   Influenza Whole 10/24/2005, 11/07/2007, 12/01/2008, 11/30/2009   Influenza,inj,Quad PF,6+ Mos 12/31/2012, 09/30/2013, 11/30/2014, 11/14/2015, 12/12/2016, 11/26/2017, 11/10/2018, 10/22/2019, 11/16/2021, 12/14/2021   PFIZER(Purple Top)SARS-COV-2 Vaccination 05/14/2019, 06/09/2019, 12/07/2019   Pneumococcal Conjugate-13 07/03/2017   Pneumococcal Polysaccharide-23 03/14/2010, 11/14/2015   Tdap 06/19/2016    Past Medical History:  Diagnosis Date   Anxiety 11/2018   Asthma    Hepatitis C    HIV (human immunodeficiency virus infection) ([place])    Hypertension    STD (sexually transmitted disease)    Substance abuse ([place])    Underweight 07/29/2014    Tobacco History: Social History   Tobacco Use  Smoking Status Every Day   Packs/day: 0.50   Years: 33.00   Total pack years: 16.50   Types: Cigarettes  Smokeless Tobacco Never  Tobacco Comments   cutting back   Ready to quit: Not Answered Counseling given: Not Answered Tobacco comments: cutting back   Outpatient Encounter Medications as of 01/25/2022  Medication Sig   acetaminophen (TYLENOL) 500 MG tablet Take 1 tablet (500 mg total) by mouth every 6 (six) hours as  needed. (Patient taking differently: Take 1,000 mg by mouth every 6 (six) hours as needed for moderate pain (abdominal pain).)   bictegravir-emtricitabine-tenofovir AF (BIKTARVY) 50-200-25 MG TABS tablet Take 1 tablet by mouth daily.   Biotin 5000 MCG TABS Take by mouth.   cetirizine (ZYRTEC) 10 MG tablet Take 1 tablet (10 mg total) by mouth daily.   Cholecalciferol (VITAMIN D3) 1000 units CAPS Take by mouth.   Ensure (ENSURE) Take 1 Can by mouth 3 (three) times daily between meals.   FLUoxetine (PROZAC) 20 MG capsule Take 1 capsule (20 mg total) by mouth daily.   fluticasone (FLONASE) 50 MCG/ACT nasal spray Place 2 sprays into both nostrils daily.   ibuprofen (ADVIL) 600 MG tablet Take 1 tablet (600 mg total) by mouth every 8 (eight) hours as needed.   mirtazapine (REMERON) 7.5 MG tablet Take 1 tablet (7.5 mg total) by mouth at bedtime.   Multiple Vitamins-Minerals (MULTIVITAMIN GUMMIES ADULTS PO) Take by mouth.   Omega-3 Fatty Acids (FISH OIL) 1000 MG CAPS Take by mouth.   sodium chloride (OCEAN) 0.65 % SOLN nasal spray Place 1 spray into both nostrils as needed for congestion.   [DISCONTINUED] amLODipine (NORVASC) 5 MG tablet TAKE 1 TABLET(5 MG) BY MOUTH DAILY   amLODipine (NORVASC) 5 MG tablet TAKE 1 TABLET(5 MG) BY MOUTH DAILY   No facility-administered encounter medications on file as of 01/25/2022.     Review of Systems  Review of Systems  Constitutional: Negative.   HENT: Negative.    Cardiovascular: Negative.   Gastrointestinal: Negative.   Allergic/Immunologic:  Negative.   Neurological: Negative.   Psychiatric/Behavioral: Negative.         Physical Exam  BP 120/80   Pulse 84   Ht 6' (1.829 m)   Wt 147 lb (66.7 kg)   LMP 03/27/2011   SpO2 98%   BMI 19.94 kg/m   Wt Readings from Last 5 Encounters:  01/25/22 147 lb (66.7 kg)  11/16/21 142 lb 12.8 oz (64.8 kg)  08/07/21 142 lb 6 oz (64.6 kg)  08/01/21 147 lb 3.2 oz (66.8 kg)  07/07/21 144 lb 4 oz (65.4 kg)      Physical Exam Vitals and nursing note reviewed.  Constitutional:      General: She is not in acute distress.    Appearance: She is well-developed.  Cardiovascular:     Rate and Rhythm: Normal rate and regular rhythm.  Pulmonary:     Effort: Pulmonary effort is normal.     Breath sounds: Normal breath sounds.  Neurological:     Mental Status: She is alert and oriented to person, place, and time.      Lab Results:  CBC    Component Value Date/Time   WBC 4.0 11/02/2021 1451   RBC 4.31 11/02/2021 1451   HGB 13.5 11/02/2021 1451   HGB 13.0 07/20/2019 1150   HCT 40.7 11/02/2021 1451   HCT 41.4 07/20/2019 1150   PLT 217 11/02/2021 1451   PLT 199 07/20/2019 1150   MCV 94.4 11/02/2021 1451   MCV 92 07/20/2019 1150   MCH 31.3 11/02/2021 1451   MCHC 33.2 11/02/2021 1451   RDW 11.8 11/02/2021 1451   RDW 11.9 07/20/2019 1150   LYMPHSABS 1,468 11/02/2021 1451   LYMPHSABS 1.3 07/20/2019 1150   MONOABS 0.7 07/20/2018 1419   EOSABS 100 11/02/2021 1451   EOSABS 0.1 07/20/2019 1150   BASOSABS 12 11/02/2021 1451   BASOSABS 0.0 07/20/2019 1150    BMET    Component Value Date/Time   NA 139 11/02/2021 1451   NA 142 07/20/2019 1150   K 4.6 11/02/2021 1451   CL 104 11/02/2021 1451   CO2 29 11/02/2021 1451   GLUCOSE 89 11/02/2021 1451   BUN 16 11/02/2021 1451   BUN 14 07/20/2019 1150   CREATININE 1.11 (H) 11/02/2021 1451   CALCIUM 9.8 11/02/2021 1451   GFRNONAA 64 07/20/2019 1150   GFRNONAA 60 05/31/2015 1726   GFRAA 74 07/20/2019 1150   GFRAA 69 05/31/2015 1726     Assessment & Plan:   Essential hypertension - amLODipine (NORVASC) 5 MG tablet; TAKE 1 TABLET(5 MG) BY MOUTH DAILY  Dispense: 90 tablet; Refill: 3   Follow up:  Follow up in 3 months or sooner if needed     Fenton Foy, NP 01/25/2022

## 2022-01-25 NOTE — Patient Instructions (Addendum)
1. Essential hypertension  - amLODipine (NORVASC) 5 MG tablet; TAKE 1 TABLET(5 MG) BY MOUTH DAILY  Dispense: 90 tablet; Refill: 3   Follow up:  Follow up in 3 months or sooner if needed

## 2022-01-25 NOTE — Assessment & Plan Note (Signed)
-   amLODipine (NORVASC) 5 MG tablet; TAKE 1 TABLET(5 MG) BY MOUTH DAILY  Dispense: 90 tablet; Refill: 3   Follow up:  Follow up in 3 months or sooner if needed

## 2022-02-07 ENCOUNTER — Ambulatory Visit (INDEPENDENT_AMBULATORY_CARE_PROVIDER_SITE_OTHER): Payer: Commercial Managed Care - HMO | Admitting: Obstetrics and Gynecology

## 2022-02-07 ENCOUNTER — Encounter: Payer: Self-pay | Admitting: Obstetrics and Gynecology

## 2022-02-07 VITALS — BP 119/73 | HR 96 | Ht 69.5 in | Wt 136.0 lb

## 2022-02-07 DIAGNOSIS — R350 Frequency of micturition: Secondary | ICD-10-CM

## 2022-02-07 DIAGNOSIS — N816 Rectocele: Secondary | ICD-10-CM

## 2022-02-07 DIAGNOSIS — R319 Hematuria, unspecified: Secondary | ICD-10-CM

## 2022-02-07 DIAGNOSIS — N3281 Overactive bladder: Secondary | ICD-10-CM

## 2022-02-07 DIAGNOSIS — N952 Postmenopausal atrophic vaginitis: Secondary | ICD-10-CM | POA: Diagnosis not present

## 2022-02-07 DIAGNOSIS — N8110 Cystocele, unspecified: Secondary | ICD-10-CM

## 2022-02-07 DIAGNOSIS — N812 Incomplete uterovaginal prolapse: Secondary | ICD-10-CM | POA: Diagnosis not present

## 2022-02-07 DIAGNOSIS — N393 Stress incontinence (female) (male): Secondary | ICD-10-CM

## 2022-02-07 LAB — POCT URINALYSIS DIPSTICK
Glucose, UA: NEGATIVE
Leukocytes, UA: NEGATIVE
Nitrite, UA: NEGATIVE
Protein, UA: POSITIVE — AB
Spec Grav, UA: 1.025 (ref 1.010–1.025)
Urobilinogen, UA: 0.2 E.U./dL
pH, UA: 5 (ref 5.0–8.0)

## 2022-02-07 MED ORDER — ESTRADIOL 0.1 MG/GM VA CREA: TOPICAL_CREAM | VAGINAL | 11 refills | Status: DC

## 2022-02-07 NOTE — Progress Notes (Signed)
[facility] New Patient Evaluation and Consultation  Referring Provider: Woodroe Mode, MD PCP: Fenton Foy, NP Date of Service: 02/07/2022  SUBJECTIVE Chief Complaint: New Patient (Initial Visit)  History of Present Illness: [name] is a 61 y.o. Black or African-American female seen in consultation at the request of Dr. Roselie Awkward for evaluation of prolapse.    Review of records significant for: Has a cystocele and is interested in surgical management.   HIV quant 11/02/21- not detected  Urinary Symptoms: Leaks urine with cough/ sneeze, laughing, exercise, lifting, going from sitting to standing, with a full bladder, with movement to the bathroom, with urgency, and while asleep. UUI > SUI Leaks 3 time(s) per day.  Pad use: 6 liners/ mini-pads per day.   She is bothered by her UI symptoms. Has not had prior treatment for incontinence.   Day time voids 8.  Nocturia: 3 times per night to void. Voiding dysfunction: she does not empty her bladder well.  does not use a catheter to empty bladder.  When urinating, she feels a weak stream, the need to urinate multiple times in a row, and to push on her belly or vagina to empty bladder Drinks: 1 cup coffee, 4 beers per day, some water.   UTIs: 1 UTI's in the last year.   Denies history of blood in urine and kidney or bladder stones  Pelvic Organ Prolapse Symptoms:                  She Admits to a feeling of a bulge the vaginal area. It has been present for 1.5 years.  She Admits to seeing a bulge.  This bulge is bothersome.  Bowel Symptom: Bowel movements: 1 time(s) per day Stool consistency: soft  Straining: yes.  Splinting: no.  Incomplete evacuation: no.  She Denies accidental bowel leakage / fecal incontinence Bowel regimen: stool softener  Sexual Function Sexually active: no.  Pain with sex: Yes, has discomfort due to prolapse  Pelvic Pain Denies pelvic pain  Past Medical History:  Past  Medical History:  Diagnosis Date   Anxiety 11/2018   Asthma    Hepatitis C    Has been treated   HIV (human immunodeficiency virus infection) ([place])    Hypertension    STD (sexually transmitted disease)    Substance abuse ([place])    Underweight 07/29/2014     Past Surgical History:   Past Surgical History:  Procedure Laterality Date   BUNIONECTOMY Left 03/15/2020   COLONOSCOPY WITH PROPOFOL N/A 06/26/2016   Procedure: COLONOSCOPY WITH PROPOFOL;  Surgeon: Otis Brace, MD;  Location: [facility];  Service: Gastroenterology;  Laterality: N/A;   fractured wrist Left    LAPAROSCOPIC APPENDECTOMY N/A 07/20/2018   Procedure: APPENDECTOMY LAPAROSCOPIC;  Surgeon: Clovis Riley, MD;  Location: [facility] OR;  Service: General;  Laterality: N/A;   PANCREATECTOMY       Past OB/GYN History: OB History  Gravida Para Term Preterm AB Living  '4 2 2   2 2  '$ SAB IAB Ectopic Multiple Live Births    2     2    # Outcome Date GA Lbr Len/2nd Weight Sex Delivery Anes PTL Lv  4 Term 1999 [redacted]w[redacted]d  M Vag-Spont     3 Term 1989 [id number] F Vag-Spont     2 IAB           1 IAB  Menopausal: Yes, at age 58, Denies vaginal bleeding since menopause Last pap smear was 2019- negative.  Any history of abnormal pap smears: no.   Medications: She has a current medication list which includes the following prescription(s): acetaminophen, amlodipine, biktarvy, biotin, cetirizine, vitamin d3, ensure, [START ON 02/08/2022] estradiol, fluoxetine, fluticasone, ibuprofen, mirtazapine, multiple vitamins-minerals, fish oil, and sodium chloride.   Allergies: Patient is allergic to aspirin.   Social History:  Social History   Tobacco Use   Smoking status: Every Day    Packs/day: 0.50    Years: 33.00    Total pack years: 16.50    Types: Cigarettes   Smokeless tobacco: Never   Tobacco comments:    cutting back  Vaping Use   Vaping Use: Never used  Substance Use Topics   Alcohol use: Yes     Alcohol/week: 5.0 standard drinks of alcohol    Types: 5 Cans of beer per week   Drug use: No    Comment: clean for 10 years    Relationship status: single She lives alone.   She is not employed. Regular exercise: No History of abuse: No  Family History:   Family History  Problem Relation Age of Onset   Cancer Mother        bone   Diabetes Sister    Colon cancer Maternal Uncle    Esophageal cancer Neg Hx    Gastric cancer Neg Hx    Pancreatic cancer Neg Hx    Liver disease Neg Hx    Inflammatory bowel disease Neg Hx      Review of Systems: Review of Systems  Constitutional:  Positive for weight loss. Negative for fever and malaise/fatigue.  Respiratory:  Negative for cough, shortness of breath and wheezing.   Cardiovascular:  Negative for chest pain, palpitations and leg swelling.  Gastrointestinal:  Positive for abdominal pain. Negative for blood in stool.  Genitourinary:  Negative for dysuria.  Musculoskeletal:  Positive for myalgias.  Skin:  Negative for rash.  Neurological:  Positive for headaches. Negative for dizziness.  Endo/Heme/Allergies:  Does not bruise/bleed easily.       + hot flashes  Psychiatric/Behavioral:  Negative for depression. The patient is not nervous/anxious.      OBJECTIVE Physical Exam: Vitals:   02/07/22 1533  BP: 119/73  Pulse: 96  Weight: 136 lb (61.7 kg)  Height: 5' 9.5" (1.765 m)    Physical Exam Constitutional:      General: She is not in acute distress. Pulmonary:     Effort: Pulmonary effort is normal.  Abdominal:     General: There is no distension.     Palpations: Abdomen is soft.     Tenderness: There is no abdominal tenderness. There is no rebound.  Musculoskeletal:        General: No swelling. Normal range of motion.  Skin:    General: Skin is warm and dry.     Findings: No rash.  Neurological:     Mental Status: She is alert and oriented to person, place, and time.  Psychiatric:        Mood and Affect: Mood  normal.        Behavior: Behavior normal.      GU / Detailed Urogynecologic Evaluation:  Pelvic Exam: Normal external female genitalia; Bartholin's and Skene's glands normal in appearance; urethral meatus normal in appearance, no urethral masses or discharge.   CST: negative  Speculum exam reveals normal vaginal mucosa with atrophy. Cervix normal appearance. Uterus normal  single, nontender. Adnexa no mass, fullness, tenderness.    Pelvic floor strength IV/V  Pelvic floor musculature: Right levator non-tender, Right obturator non-tender, Left levator non-tender, Left obturator non-tender  POP-Q:   POP-Q  0                                            Aa   0                                           Ba  -1                                              C   2.5                                            Gh  2.5                                            Pb  8                                            tvl   -1                                            Ap  -1                                            Bp  -6.5                                              D      Rectal Exam:  Normal external rectum  Post-Void Residual (PVR) by Bladder Scan: In order to evaluate bladder emptying, we discussed obtaining a postvoid residual and she agreed to this procedure.  Procedure: The ultrasound unit was placed on the patient's abdomen in the suprapubic region after the patient had voided. A PVR of 20 ml was obtained by bladder scan.  Laboratory Results: POC urine: negative  ASSESSMENT AND PLAN Ms. Melena is a 61 y.o. with:  1. Prolapse of anterior vaginal wall   2. Prolapse of posterior vaginal wall   3. Uterovaginal prolapse, incomplete   4. Vaginal atrophy   5. Overactive bladder   6. Hematuria, unspecified type   7. SUI (stress urinary incontinence, female)   8. Urinary frequency    Stage II anterior, Stage II posterior, Stage II apical  prolapse - For treatment of  pelvic organ prolapse, we discussed options for management including expectant management, conservative management, and surgical management, such as Kegels, a pessary, pelvic floor physical therapy, and specific surgical procedures. - We discussed two options for prolapse repair:  1) vaginal repair without mesh - Pros - safer, no mesh complications - Cons - not as strong as mesh repair, higher risk of recurrence  2) laparoscopic repair with mesh - Pros - stronger, better long-term success - Cons - risks of mesh implant (erosion into vagina or bladder, adhering to the rectum, pain) - these risks are lower than with a vaginal mesh but still exist - Handouts provided on surgical options.  - She is past due for her pap smear. She sees her PCP soon and will discuss this with them.   Vaginal atrophy - Prescribed estrace cream 0.5g nightly for two weeks then twice a week after - Can use other lubricant in between as needed  3. OAB - We discussed the symptoms of overactive bladder (OAB), which include urinary urgency, urinary frequency, nocturia, with or without urge incontinence.  While we do not know the exact etiology of OAB, several treatment options exist. We discussed management including behavioral therapy (decreasing bladder irritants, urge suppression strategies, timed voids, bladder retraining), physical therapy, medication; for refractory cases posterior tibial nerve stimulation, sacral neuromodulation, and intravesical botulinum toxin injection.  - She will work on cutting back on alcohol and try to drink more water to decrease bladder urgency.   4. SUI - For treatment of stress urinary incontinence,  non-surgical options include expectant management, weight loss, physical therapy, as well as a pessary.  Surgical options include a midurethral sling, Burch urethropexy, and transurethral injection of a bulking agent. - She will undergo urodynamic testing. Handouts provided on surgical  options.   5. Blood in urine - will send for micro UA and culture  Return for urodynamic testing  Jaquita Folds, MD

## 2022-02-07 NOTE — Patient Instructions (Addendum)
Today we talked about ways to manage bladder urgency such as altering your diet to avoid irritative beverages and foods (bladder diet) as well as attempting to decrease stress and other exacerbating factors.    The Most Bothersome Foods* The Least Bothersome Foods*  Coffee - Regular & Decaf Tea - caffeinated Carbonated beverages - cola, non-colas, diet & caffeine-free Alcohols - Beer, Red Wine, White Wine, Champagne Fruits - Grapefruit, [place], Orange, [employer] - Cranberry, Grapefruit, Orange, Pineapple Vegetables - Tomato & Tomato Products Flavor Enhancers - Hot peppers, Spicy foods, Chili, Horseradish, Vinegar, Monosodium glutamate (MSG) Artificial Sweeteners - NutraSweet, Sweet 'N Low, Equal (sweetener), Saccharin Ethnic foods - [place], [place], [place] food Express Scripts - low-fat & whole Fruits - Bananas, Blueberries, Honeydew melon, Pears, Raisins, Watermelon Vegetables - Broccoli, Brussels Sprouts, [place], Carrots, Cauliflower, [place], Cucumber, Mushrooms, Peas, Radishes, Squash, Zucchini, White potatoes, Sweet potatoes & yams Poultry - Chicken, Eggs, [place], [employer] - Beef, Programmer, multimedia, Lamb Seafood - Shrimp, [place] fish, Salmon Grains - Oat, Rice Snacks - Pretzels, Popcorn  *Lissa Morales et al. Diet and its role in interstitial cystitis/bladder pain syndrome (IC/BPS) and comorbid conditions. [place] 2012 Jan 11.   Stop drinking 2-3 hours prior to bedtime.   You have a stage 2 (out of 4) prolapse.  We discussed the fact that it is not life threatening but there are several treatment options. For treatment of pelvic organ prolapse, we discussed options for management including expectant management, conservative management, and surgical management, such as Kegels, a pessary, pelvic floor physical therapy, and specific surgical procedures.    For treatment of stress urinary incontinence, which is leakage with physical activity/movement/strainging/coughing, we  discussed expectant management versus nonsurgical options versus surgery. Nonsurgical options include weight loss, physical therapy, as well as a pessary.  Surgical options include a midurethral sling, which is a synthetic mesh sling that acts like a hammock under the urethra to prevent leakage of urine, a Burch urethropexy, and transurethral injection of a bulking agent.   URODYNAMICS (UDS) TEST INFORMATION  IMPORTANT: Please try to arrive with a comfortably full bladder!    What is UDS? Urodynamics is a bladder test used to evaluate how your bladder and urethra (tube you urinate out of) work to help find out the cause of your bladder symptoms and evaluate your bladder function in order to make the best treatment plan for you.   What to expect? A nurse will perform the test and will be with you during the entire exam. First we will have to empty your bladder on a special toilet.  After you have emptied your bladder, very small catheters (plastic tubing) will be placed into your bladder and into your vagina (or rectum). These special small catheters measure pressure to help measure your bladder function.  Your bladder will be gently filled with water and you will be asked to cough and strain at several different points during the test.   You will then be asked to empty your bladder in the special toilet with the catheters in place. Most patients can urinate (pee) easily with the catheters in place since the catheters are so small. In total this procedure lasts about 45 minutes to 1 hour.  After your test is completed, you will return (or possibly be seen the same day) to review the results, talk about treatment options and make a plan moving forward.

## 2022-02-08 ENCOUNTER — Ambulatory Visit: Payer: Commercial Managed Care - HMO | Admitting: Internal Medicine

## 2022-02-08 LAB — URINALYSIS, MICROSCOPIC ONLY: RBC, Urine: NONE SEEN /hpf (ref 0–2)

## 2022-02-13 NOTE — Progress Notes (Addendum)
[facility] Urodynamics Procedure  Referring Physician: Fenton Foy, NP Date of Procedure: 02/14/2022  [name] is a 62 y.o. female who presents for urodynamic evaluation. Indication(s) for study: SUI and OAB  Vital Signs: LMP 03/27/2011   Laboratory Results: A catheterized urine specimen revealed:  POC urine: Negative for all components   Voiding Diary: Not done  Procedure Timeout:  The correct patient was verified and the correct procedure was verified. The patient was in the correct position and safety precautions were reviewed based on at the patient's history.  Urodynamic Procedure A 48F dual lumen urodynamics catheter was placed under sterile conditions into the patient's bladder. A 48F catheter was placed into the rectum in order to measure abdominal pressure. EMG patches were placed in the appropriate position.  All connections were confirmed and calibrations/adjusted made. Saline was instilled into the bladder through the dual lumen catheters.  Cough/valsalva pressures were measured periodically during filling.  Patient was allowed to void.  The bladder was then emptied of its residual.  UROFLOW: Revealed a Qmax of 22.1 mL/sec.  She voided 213 mL and had a residual of 50 mL.  It was a intermittent pattern and represented normal habits.  CMG: This was performed with sterile water in the sitting position at a fill rate of 20-30 mL/min.    First sensation of fullness was 122 mLs,  First urge was 136 mLs,  Strong urge was 151 mLs and  Capacity was 394 mLs  Stress incontinence was demonstrated Lowest positive Barrier VLPP was 2 cmH20 at 142 ml.   Attempted to assess leaking more appropriately and with any filling greater than 160m would completely empty her bladder with valsalva. Was able to hold urination with cough.   Detrusor function was overactive, with contraction seen at capacity.  Compliance:  Normal . End fill detrusor pressure was  0 cmH20.    UPP: MUCP with barrier reduction was 91 cm of water.    MICTURITION STUDY: Voiding was performed with reduction using scopettes in the sitting position.  Pdet at Qmax was 25.2 cm of water.  Qmax was 12.5 mL/sec.  It was a intermittent pattern.  She voided 314.1 mL and had a residual of 80 mL.  It was not a volitional void, sustained detrusor contraction was present and abdominal straining was present  Of note, the above information represents micturition study for second filling. Patient had an involuntary urination during valsalva maneuver during stress test at approximately 1561mfilled. She was asked to bear down for valsalva stress test and urinated 30064mhich resulted in the need to repeat filling to fully evaluate bladder emptying.   EMG: This was performed with patches.  She had voluntary contractions, recruitment with fill was present and urethral sphincter was relaxed with void.  The details of the procedure with the study tracings have been scanned into EPIC.   Urodynamic Impression:  1. Sensation was normal; capacity was normal 2. Stress Incontinence was demonstrated at normal pressures; 3. Detrusor Overactivity was demonstrated with leakage, but she also had leakage with no detrusor overactivity combined with a valsalva that resulted in the need to refill.  4. Emptying was dysfunctional with a normal PVR, a sustained detrusor contraction present,  abdominal straining present, normal urethral sphincter activity on EMG.  Plan: - The patient will follow up  to discuss the findings and treatment options.

## 2022-02-14 ENCOUNTER — Encounter: Payer: Self-pay | Admitting: Obstetrics and Gynecology

## 2022-02-14 ENCOUNTER — Ambulatory Visit (INDEPENDENT_AMBULATORY_CARE_PROVIDER_SITE_OTHER): Payer: BC Managed Care – PPO | Admitting: Obstetrics and Gynecology

## 2022-02-14 VITALS — BP 133/79 | HR 70

## 2022-02-14 DIAGNOSIS — N812 Incomplete uterovaginal prolapse: Secondary | ICD-10-CM

## 2022-02-14 DIAGNOSIS — N393 Stress incontinence (female) (male): Secondary | ICD-10-CM | POA: Diagnosis not present

## 2022-02-14 DIAGNOSIS — R350 Frequency of micturition: Secondary | ICD-10-CM | POA: Diagnosis not present

## 2022-02-14 DIAGNOSIS — N3281 Overactive bladder: Secondary | ICD-10-CM | POA: Diagnosis not present

## 2022-02-14 LAB — POCT URINALYSIS DIPSTICK
Bilirubin, UA: NEGATIVE
Blood, UA: NEGATIVE
Glucose, UA: NEGATIVE
Ketones, UA: NEGATIVE
Leukocytes, UA: NEGATIVE
Nitrite, UA: NEGATIVE
Protein, UA: NEGATIVE
Spec Grav, UA: <=1.005 — AB (ref 1.010–1.025)
Urobilinogen, UA: 0.2 E.U./dL
pH, UA: 6 (ref 5.0–8.0)

## 2022-02-14 NOTE — Patient Instructions (Signed)

## 2022-02-15 ENCOUNTER — Ambulatory Visit (INDEPENDENT_AMBULATORY_CARE_PROVIDER_SITE_OTHER): Payer: BC Managed Care – PPO | Admitting: Internal Medicine

## 2022-02-15 ENCOUNTER — Ambulatory Visit (INDEPENDENT_AMBULATORY_CARE_PROVIDER_SITE_OTHER): Payer: Commercial Managed Care - HMO

## 2022-02-15 ENCOUNTER — Ambulatory Visit
Admission: RE | Admit: 2022-02-15 | Discharge: 2022-02-15 | Disposition: A | Discharge status: Home / Self Care | Payer: Commercial Managed Care - HMO | Source: Ambulatory Visit | Attending: Internal Medicine | Admitting: Internal Medicine

## 2022-02-15 ENCOUNTER — Encounter: Payer: Self-pay | Admitting: Internal Medicine

## 2022-02-15 ENCOUNTER — Other Ambulatory Visit: Payer: Self-pay

## 2022-02-15 VITALS — BP 136/88 | HR 88 | Resp 16 | Ht 69.5 in | Wt 141.0 lb

## 2022-02-15 DIAGNOSIS — Z0000 Encounter for general adult medical examination without abnormal findings: Secondary | ICD-10-CM

## 2022-02-15 DIAGNOSIS — Z1231 Encounter for screening mammogram for malignant neoplasm of breast: Secondary | ICD-10-CM

## 2022-02-15 DIAGNOSIS — R634 Abnormal weight loss: Secondary | ICD-10-CM | POA: Diagnosis not present

## 2022-02-15 DIAGNOSIS — Z23 Encounter for immunization: Secondary | ICD-10-CM | POA: Diagnosis not present

## 2022-02-15 DIAGNOSIS — B20 Human immunodeficiency virus [HIV] disease: Secondary | ICD-10-CM | POA: Diagnosis not present

## 2022-02-15 DIAGNOSIS — F17210 Nicotine dependence, cigarettes, uncomplicated: Secondary | ICD-10-CM

## 2022-02-15 MED ORDER — BIKTARVY 50-200-25 MG PO TABS: 1.0000 | ORAL_TABLET | Freq: Every day | ORAL | 11 refills | Status: DC

## 2022-02-15 MED ORDER — ROSUVASTATIN CALCIUM 5 MG PO TABS: 5.0000 mg | ORAL_TABLET | Freq: Every day | ORAL | 11 refills | Status: DC

## 2022-02-15 NOTE — Progress Notes (Signed)
RFV: follow up for hiv disease  Patient ID: [name], female   DOB: Aug 25, 1960, 62 y.o.   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 Currently on biktarvy.   Has been staying healthy. Continues to take biktarvy daily.  Has hx of vaginal prolapse - gyneocologist is evaluating  Sochx: looking for work.   Wants to stop smoking has tried nicotine patches in the past that worked. But costly for her.  Outpatient Encounter Medications as of 02/15/2022  Medication Sig   acetaminophen (TYLENOL) 500 MG tablet Take 1 tablet (500 mg total) by mouth every 6 (six) hours as needed. (Patient taking differently: Take 1,000 mg by mouth every 6 (six) hours as needed for moderate pain (abdominal pain).)   amLODipine (NORVASC) 5 MG tablet TAKE 1 TABLET(5 MG) BY MOUTH DAILY   bictegravir-emtricitabine-tenofovir AF (BIKTARVY) 50-200-25 MG TABS tablet Take 1 tablet by mouth daily.   Biotin 5000 MCG TABS Take by mouth.   cetirizine (ZYRTEC) 10 MG tablet Take 1 tablet (10 mg total) by mouth daily.   Cholecalciferol (VITAMIN D3) 1000 units CAPS Take by mouth.   Ensure (ENSURE) Take 1 Can by mouth 3 (three) times daily between meals.   estradiol (ESTRACE) 0.1 MG/GM vaginal cream Place 0.5g nightly for two weeks then twice a week after   FLUoxetine (PROZAC) 20 MG capsule Take 1 capsule (20 mg total) by mouth daily.   fluticasone (FLONASE) 50 MCG/ACT nasal spray Place 2 sprays into both nostrils daily.   ibuprofen (ADVIL) 600 MG tablet Take 1 tablet (600 mg total) by mouth every 8 (eight) hours as needed.   mirtazapine (REMERON) 7.5 MG tablet Take 1 tablet (7.5 mg total) by mouth at bedtime.   Multiple Vitamins-Minerals (MULTIVITAMIN GUMMIES ADULTS PO) Take by mouth.   Omega-3 Fatty Acids (FISH OIL) 1000 MG CAPS Take by mouth.   sodium chloride  (OCEAN) 0.65 % SOLN nasal spray Place 1 spray into both nostrils as needed for congestion.   No facility-administered encounter medications on file as of 02/15/2022.     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  Review of Systems Review of Systems  Constitutional: Negative for fever, chills, diaphoresis, activity change, appetite change, fatigue and unexpected weight change. Has unexpected weight loss - due to less food intake HENT: Negative for congestion, sore throat, rhinorrhea, sneezing, trouble swallowing and sinus pressure.  Eyes: Negative for photophobia and visual  disturbance.  Respiratory: Negative for cough, chest tightness, shortness of breath, wheezing and stridor.  Cardiovascular: Negative for chest pain, palpitations and leg swelling.  Gastrointestinal: Negative for nausea, vomiting, abdominal pain, diarrhea, constipation, blood in stool, abdominal distention and anal bleeding.  Genitourinary: Negative for dysuria, hematuria, flank pain and difficulty urinating.  Musculoskeletal: Negative for myalgias, back  pain, joint swelling, arthralgias and gait problem.  Skin: Negative for color change, pallor, rash and wound.  Neurological: Negative for dizziness, tremors, weakness and light-headedness.  Hematological: Negative for adenopathy. Does not bruise/bleed easily.  Psychiatric/Behavioral: Negative for behavioral problems, confusion, sleep disturbance, dysphoric mood, decreased concentration and agitation.   Physical Exam   BP 136/88   Pulse 88   Resp 16   Ht 5' 9.5" (1.765 m)   Wt 141 lb (64 kg)   LMP 03/27/2011   SpO2 96%   BMI 20.52 kg/m   Physical Exam  Constitutional:  oriented to person, place, and time. appears well-developed and well-nourished. No distress.  HENT: /AT, PERRLA, no scleral icterus Mouth/Throat: Oropharynx is clear and moist. No oropharyngeal exudate.  Cardiovascular: Normal rate, regular rhythm and normal heart sounds. Exam reveals no gallop and no friction rub.  No murmur heard.  Pulmonary/Chest: Effort normal and breath sounds normal. No respiratory distress.  has no wheezes.  Neck = supple, no nuchal rigidity Abdominal: Soft. Bowel sounds are normal.  exhibits no distension. There is no tenderness.  Lymphadenopathy: no cervical adenopathy. No axillary adenopathy Neurological: alert and oriented to person, place, and time.  Skin: Skin is warm and dry. No rash noted. No erythema.  Psychiatric: a normal mood and affect.  behavior is normal.   Lab Results  Component Value Date   CD4TCELL 29 (L) 11/02/2021   Lab Results  Component Value Date   CD4TABS 413 11/02/2021   CD4TABS 444 07/07/2020   CD4TABS 419 10/08/2019   Lab Results  Component Value Date   HIV1RNAQUANT Not Detected 11/02/2021   Lab Results  Component Value Date   HEPBSAB YES 04/08/2006   Lab Results  Component Value Date   LABRPR REACTIVE (A) 11/02/2021    CBC Lab Results  Component Value Date   WBC 4.0 11/02/2021   RBC 4.31 11/02/2021   HGB 13.5 11/02/2021   HCT 40.7  11/02/2021   PLT 217 11/02/2021   MCV 94.4 11/02/2021   MCH 31.3 11/02/2021   MCHC 33.2 11/02/2021   RDW 11.8 11/02/2021   LYMPHSABS 1,468 11/02/2021   MONOABS 0.7 07/20/2018   EOSABS 100 11/02/2021    BMET Lab Results  Component Value Date   NA 139 11/02/2021   K 4.6 11/02/2021   CL 104 11/02/2021   CO2 29 11/02/2021   GLUCOSE 89 11/02/2021   BUN 16 11/02/2021   CREATININE 1.11 (H) 11/02/2021   CALCIUM 9.8 11/02/2021   GFRNONAA 64 07/20/2019   GFRAA 74 07/20/2019    Assessment and Plan  Health maintenance = needs  Gyn appt And Mammogram Will get covid vaccine today  Smoking cessation = try to see what access we can get for nicotine patches to help reduce smoking Since smoking since 62yo.,\\  Reprieve trial and cardiac event prevention == will start on rosuvastatin '5mg'$  daily  Unexpected weight loss plus 22 pack year hx of smoking= unclear if part of recent job loss and less access to food. Will arrange for imaging for pulm cancer screening, asymptomatic from pulmonary perspective  Hiv disease= labs and refills on biktarvy   Rtc  in 6 months

## 2022-02-16 LAB — T-HELPER CELL (CD4) - (RCID CLINIC ONLY)
CD4 % Helper T Cell: 30 % — ABNORMAL LOW (ref 33–65)
CD4 T Cell Abs: 356 /uL — ABNORMAL LOW (ref 400–1790)

## 2022-02-18 LAB — COMPLETE METABOLIC PANEL WITH GFR
AG Ratio: 1.5 (calc) (ref 1.0–2.5)
ALT: 11 U/L (ref 6–29)
AST: 19 U/L (ref 10–35)
Albumin: 4.6 g/dL (ref 3.6–5.1)
Alkaline phosphatase (APISO): 123 U/L (ref 37–153)
BUN: 10 mg/dL (ref 7–25)
CO2: 27 mmol/L (ref 20–32)
Calcium: 9.7 mg/dL (ref 8.6–10.4)
Chloride: 108 mmol/L (ref 98–110)
Creat: 0.99 mg/dL (ref 0.50–1.05)
Globulin: 3 g/dL (calc) (ref 1.9–3.7)
Glucose, Bld: 104 mg/dL — ABNORMAL HIGH (ref 65–99)
Potassium: 4.3 mmol/L (ref 3.5–5.3)
Sodium: 143 mmol/L (ref 135–146)
Total Bilirubin: 0.4 mg/dL (ref 0.2–1.2)
Total Protein: 7.6 g/dL (ref 6.1–8.1)
eGFR: 65 mL/min/{1.73_m2} (ref >=60)

## 2022-02-18 LAB — HIV-1 RNA QUANT-NO REFLEX-BLD
HIV 1 RNA Quant: NOT DETECTED Copies/mL
HIV-1 RNA Quant, Log: NOT DETECTED Log cps/mL

## 2022-02-18 LAB — CBC WITH DIFFERENTIAL/PLATELET
Absolute Monocytes: 350 cells/uL (ref 200–950)
Basophils Absolute: 9 cells/uL (ref 0–200)
Basophils Relative: 0.2 %
Eosinophils Absolute: 110 cells/uL (ref 15–500)
Eosinophils Relative: 2.4 %
HCT: 40.9 % (ref 35.0–45.0)
Hemoglobin: 13.6 g/dL (ref 11.7–15.5)
Lymphs Abs: 1302 cells/uL (ref 850–3900)
MCH: 31.1 pg (ref 27.0–33.0)
MCHC: 33.3 g/dL (ref 32.0–36.0)
MCV: 93.4 fL (ref 80.0–100.0)
MPV: 10.9 fL (ref 7.5–12.5)
Monocytes Relative: 7.6 %
Neutro Abs: 2829 cells/uL (ref 1500–7800)
Neutrophils Relative %: 61.5 %
Platelets: 219 10*3/uL (ref 140–400)
RBC: 4.38 10*6/uL (ref 3.80–5.10)
RDW: 11.6 % (ref 11.0–15.0)
Total Lymphocyte: 28.3 %
WBC: 4.6 10*3/uL (ref 3.8–10.8)

## 2022-02-18 LAB — LIPID PANEL
Cholesterol: 178 mg/dL (ref <200)
HDL: 80 mg/dL (ref >=50)
LDL Cholesterol (Calc): 79 mg/dL (calc)
Non-HDL Cholesterol (Calc): 98 mg/dL (calc) (ref <130)
Total CHOL/HDL Ratio: 2.2 (calc) (ref <5.0)
Triglycerides: 108 mg/dL (ref <150)

## 2022-02-18 LAB — T PALLIDUM AB: T Pallidum Abs: NEGATIVE

## 2022-02-18 LAB — RPR: RPR Ser Ql: REACTIVE — AB

## 2022-02-18 LAB — RPR TITER: RPR Titer: 1:2 {titer} — ABNORMAL HIGH

## 2022-02-22 ENCOUNTER — Telehealth: Payer: Self-pay

## 2022-02-22 NOTE — Telephone Encounter (Signed)
I've attempted to contact patient to ask patient for her correct address. Upon trying to finalize to PA for  Estradiol 0.'1MG'$ /GM cream, I noticed the patient has a different address listed with with insurance than she has provided [place]. I am unable to continue with her prior authorization until patient confirms her address.

## 2022-03-07 ENCOUNTER — Ambulatory Visit (INDEPENDENT_AMBULATORY_CARE_PROVIDER_SITE_OTHER): Payer: BC Managed Care – PPO | Admitting: Obstetrics and Gynecology

## 2022-03-07 ENCOUNTER — Encounter: Payer: Self-pay | Admitting: Obstetrics and Gynecology

## 2022-03-07 VITALS — BP 110/71 | HR 83

## 2022-03-07 DIAGNOSIS — N3281 Overactive bladder: Secondary | ICD-10-CM | POA: Diagnosis not present

## 2022-03-07 DIAGNOSIS — N816 Rectocele: Secondary | ICD-10-CM | POA: Diagnosis not present

## 2022-03-07 DIAGNOSIS — N812 Incomplete uterovaginal prolapse: Secondary | ICD-10-CM

## 2022-03-07 DIAGNOSIS — N8110 Cystocele, unspecified: Secondary | ICD-10-CM

## 2022-03-07 MED ORDER — SOLIFENACIN SUCCINATE 10 MG PO TABS: 10.0000 mg | ORAL_TABLET | Freq: Every day | ORAL | 5 refills | Status: DC

## 2022-03-07 NOTE — Progress Notes (Unsigned)
[facility] Return Visit  SUBJECTIVE  History of Present Illness: [name] is a 62 y.o. female seen in follow-up for prolapse. She underwent urodynamic testing.   Urodynamic Impression:  1. Sensation was normal; capacity was normal 2. Stress Incontinence was demonstrated at normal pressures; 3. Detrusor Overactivity was demonstrated with leakage, but she also had leakage with no detrusor overactivity combined with a valsalva that resulted in the need to refill.  4. Emptying was dysfunctional with a normal PVR, a sustained detrusor contraction present,  abdominal straining present, normal urethral sphincter activity on EMG.  Past Medical History: Patient  has a past medical history of Anxiety (11/2018), Asthma, Hepatitis C, HIV (human immunodeficiency virus infection) ([place]), Hypertension, STD (sexually transmitted disease), Substance abuse ([place]), and Underweight (07/29/2014).   Past Surgical History: She  has a past surgical history that includes Colonoscopy with propofol (N/A, 06/26/2016); laparoscopic appendectomy (N/A, 07/20/2018); Pancreatectomy; Bunionectomy (Left, 03/15/2020); and fractured wrist (Left).   Medications: She has a current medication list which includes the following prescription(s): acetaminophen, amlodipine, biktarvy, biotin, cetirizine, vitamin d3, ensure, fluoxetine, fluticasone, ibuprofen, mirtazapine, multiple vitamins-minerals, fish oil, rosuvastatin, sodium chloride, solifenacin, and estradiol.   Allergies: Patient is allergic to aspirin.   Social History: Patient  reports that she has been smoking cigarettes. She has a 16.50 pack-year smoking history. She has never used smokeless tobacco. She reports current alcohol use of about 5.0 standard drinks of alcohol per week. She reports that she does not use drugs.      OBJECTIVE     Physical Exam: Vitals:   03/07/22 1042  BP: 110/71  Pulse: 83   Gen: No apparent distress, A&O x  3.  Detailed Urogynecologic Evaluation:  Deferred. Prior exam showed:  POP-Q   0                                            Aa   0                                           Ba   -1                                              C    2.5                                            Gh   2.5                                            Pb   8                                            tvl    -1  Ap   -1                                            Bp   -6.5                                              D      ASSESSMENT AND PLAN    [name] is a 62 y.o. with:  1. Overactive bladder    - Discussed options for leakage with OAB symptoms. Prescribed vesicare '10mg'$  daily. Discussed side effects of dry mouth, dry eyes and constipation.  - Reviewed options for SUI and she wants to proceed with sling at the time of prolapse repair.   Plan for surgery: Exam under anesthesia, robotic assisted total laparoscopic hysterectomy with bilateral salpingectomy, sacrocolpopexy, midurethral sling, cystoscopy  - We reviewed the patient's specific anatomic and functional findings, with the assistance of diagrams, and together finalized the above procedure. The planned surgical procedures were discussed along with the surgical risks outlined below, which were also provided on a detailed handout. Additional treatment options including expectant management, conservative management, medical management were discussed where appropriate.  We reviewed the benefits and risks of each treatment option.   General Surgical Risks: For all procedures, there are risks of bleeding, infection, damage to surrounding organs including but not limited to bowel, bladder, blood vessels, ureters and nerves, and need for further surgery if an injury were to occur. These risks are all low with minimally invasive surgery.   There are risks of numbness and weakness at any body site or  buttock/rectal pain.  It is possible that baseline pain can be worsened by surgery, either with or without mesh. If surgery is vaginal, there is also a low risk of possible conversion to laparoscopy or open abdominal incision where indicated. Very rare risks include blood transfusion, blood clot, heart attack, pneumonia, or death.   There is also a risk of short-term postoperative urinary retention with need to use a catheter. About half of patients need to go home from surgery with a catheter, which is then later removed in the office. The risk of long-term need for a catheter is very low. There is also a risk of worsening of overactive bladder.   Sling: The effectiveness of a midurethral vaginal mesh sling is approximately 85%, and thus, there will be times when you may leak urine after surgery, especially if your bladder is full or if you have a strong cough. There is a balance between making the sling tight enough to treat your leakage but not too tight so that you have long-term difficulty emptying your bladder. A mesh sling will not directly treat overactive bladder/urge incontinence and may worsen it.  There is an FDA safety notification on vaginal mesh procedures for prolapse but NOT mesh slings. We have extensive experience and training with mesh placement and we have close postoperative follow up to identify any potential complications from mesh. It is important to realize that this mesh is a permanent implant that cannot be easily removed. There are rare risks of mesh exposure (2-4%), pain with intercourse (0-7%), and infection (<1%). The risk of mesh exposure if more likely in a woman with risks for poor healing (prior radiation,  poorly controlled diabetes, or immunocompromised). The risk of new or worsened chronic pain after mesh implant is more common in women with baseline chronic pain and/or poorly controlled anxiety or depression. Approximately 2-4% of patients will experience longer-term  post-operative voiding dysfunction that may require surgical revision of the sling. We also reviewed that postoperatively, her stream may not be as strong as before surgery.   Prolapse (with or without mesh): Risk factors for surgical failure  include things that put pressure on your pelvis and the surgical repair, including obesity, chronic cough, and heavy lifting or straining (including lifting children or adults, straining on the toilet, or lifting heavy objects such as furniture or anything weighing >25 lbs. Risks of recurrence is 20-30% with vaginal native tissue repair and a less than 10% with sacrocolpopexy with mesh.    Sacrocolpopexy: Mesh implants may provide more prolapse support, but do have some unique risks to consider. It is important to understand that mesh is permanent and cannot be easily removed. Risks of abdominal sacrocolpopexy mesh include mesh exposure (~3-6%), painful intercourse (recent studies show lower rates after surgery compared to before, with ~5-8% risk of new onset), and very rare risks of bowel or bladder injury or infection (<1%). The risk of mesh exposure is more likely in a woman with risks for poor healing (prior radiation, poorly controlled diabetes, or immunocompromised). The risk of new or worsened chronic pain after mesh implant is more common in women with baseline chronic pain and/or poorly controlled anxiety or depression. There is an FDA safety notification on vaginal mesh procedures for prolapse but NOT abdominal mesh procedures and therefore does not apply to your surgery. We have extensive experience and training with mesh placement and we have close postoperative follow up to identify any potential complications from mesh.    - For preop Visit:  She is required to have a visit within 30 days of her surgery.    - Medical clearance: not required But she needs to have a pap smear completed prior to procedure. She states she will follow up with her ID doctor  to have this completed and declines referral to OBGYN.  - HIV titers not detectable - Anticoagulant use: No - Medicaid Hysterectomy form: No - Accepts blood transfusion: Yes - Expected length of stay: outpatient  Request sent for surgery scheduling. Pt prefers for scheduler to contact her through [facility].  She will return for medication follow up in 6 weeks since she is unsure when she wants to proceed with surgery due to looking for a new job.  Jaquita Folds, MD

## 2022-03-12 ENCOUNTER — Encounter: Payer: Self-pay | Admitting: Internal Medicine

## 2022-03-13 ENCOUNTER — Ambulatory Visit
Admission: RE | Admit: 2022-03-13 | Discharge: 2022-03-13 | Disposition: A | Discharge status: Home / Self Care | Payer: BC Managed Care – PPO | Source: Ambulatory Visit | Attending: Internal Medicine | Admitting: Internal Medicine

## 2022-03-13 DIAGNOSIS — F17210 Nicotine dependence, cigarettes, uncomplicated: Secondary | ICD-10-CM

## 2022-03-13 DIAGNOSIS — R634 Abnormal weight loss: Secondary | ICD-10-CM

## 2022-03-14 ENCOUNTER — Other Ambulatory Visit: Payer: Self-pay

## 2022-03-14 ENCOUNTER — Encounter (HOSPITAL_BASED_OUTPATIENT_CLINIC_OR_DEPARTMENT_OTHER): Payer: Self-pay | Admitting: *Deleted

## 2022-03-14 DIAGNOSIS — R911 Solitary pulmonary nodule: Secondary | ICD-10-CM

## 2022-03-29 ENCOUNTER — Other Ambulatory Visit: Payer: Self-pay

## 2022-03-29 DIAGNOSIS — I10 Essential (primary) hypertension: Secondary | ICD-10-CM

## 2022-03-29 NOTE — Telephone Encounter (Signed)
Med sent into [facility] [address]

## 2022-03-29 NOTE — Telephone Encounter (Signed)
Caller & Relationship to patient:  MRN #  [id number]   Call Back Number:   Date of Last Office Visit: 01/25/2022     Date of Next Office Visit: 04/27/2022    Medication(s) to be Refilled: Amlodipine  Preferred Pharmacy:   ** Please notify patient to allow 48-72 hours to process** **Let patient know to contact pharmacy at the end of the day to make sure medication is ready. ** **If patient has not been seen in a year or longer, book an appointment **Advise to use MyChart for refill requests OR to contact their pharmacy

## 2022-03-29 NOTE — Telephone Encounter (Signed)
Lvm advising pt her med was sent in to  [facility] [address] . [place]

## 2022-04-02 ENCOUNTER — Encounter: Payer: Self-pay | Admitting: Pulmonary Disease

## 2022-04-02 ENCOUNTER — Encounter: Payer: Self-pay | Admitting: *Deleted

## 2022-04-02 ENCOUNTER — Ambulatory Visit (INDEPENDENT_AMBULATORY_CARE_PROVIDER_SITE_OTHER): Payer: BC Managed Care – PPO | Admitting: Pulmonary Disease

## 2022-04-02 VITALS — BP 130/80 | HR 79 | Ht 70.0 in | Wt 144.0 lb

## 2022-04-02 DIAGNOSIS — R911 Solitary pulmonary nodule: Secondary | ICD-10-CM

## 2022-04-02 DIAGNOSIS — Z720 Tobacco use: Secondary | ICD-10-CM

## 2022-04-02 DIAGNOSIS — Z716 Tobacco abuse counseling: Secondary | ICD-10-CM | POA: Diagnosis not present

## 2022-04-02 DIAGNOSIS — B20 Human immunodeficiency virus [HIV] disease: Secondary | ICD-10-CM | POA: Diagnosis not present

## 2022-04-02 NOTE — Patient Instructions (Addendum)
Thank you for visiting Dr. Valeta Harms at [facility]. Today we recommend the following:  Orders Placed This Encounter  Procedures   NM PET Image Initial (PI) Skull Base To Thigh (F-18 FDG)   Pulmonary Function Test   Return in about 4 weeks (around 04/30/2022) for with Eric Form, NP, or Dr. Valeta Harms.    Please do your part to reduce the spread of COVID-19.   You must quit smoking or vaping. This is the single most important thing that you can do to improve your lung health.   S = Set a quit date. T = Tell family, friends, and the people around you that you plan to quit. A = Anticipate or plan ahead for the tough times you'll face while quitting. R = Remove cigarettes and other tobacco products from your home, car, and work T = Talk to [place] about getting help to quit  If you need help feel free to reach out to our office, [place] [facility]: [phone], call 1-800-QUIT-NOW, or visit [email or web address]

## 2022-04-02 NOTE — Progress Notes (Signed)
Synopsis: Referred in February 2024 for pulmonary nodule by Fenton Foy, NP  Subjective:   PATIENT ID: [name] GENDER: female DOB: 1960-05-01, MRN: [id number]  Chief Complaint  Patient presents with   Consult    Lung nodule.    This is a 62 year old female, past medical history of anxiety, hepatitis C, HIV, mother with a history of bone malignancy.  Patient is a current every day smoker for the past 30+ years.  She had a lung cancer screening CT completed that revealed a posterior right upper lobe nodule.  From respiratory standpoint she has no complaints.  She has been actively trying to reduce her smoking.  She has cut down to 8 cigarettes today.  Today we talked about various ways to decrease smoking cessation.  We also gave her some information to help with cost of nicotine replacement patches.    Past Medical History:  Diagnosis Date   Anxiety 11/2018   Asthma    Hepatitis C    Has been treated   HIV (human immunodeficiency virus infection) ([place])    Hypertension    STD (sexually transmitted disease)    Substance abuse ([place])    Underweight 07/29/2014     Family History  Problem Relation Age of Onset   Cancer Mother        bone   Diabetes Sister    Colon cancer Maternal Uncle    Esophageal cancer Neg Hx    Gastric cancer Neg Hx    Pancreatic cancer Neg Hx    Liver disease Neg Hx    Inflammatory bowel disease Neg Hx      Past Surgical History:  Procedure Laterality Date   BUNIONECTOMY Left 03/15/2020   COLONOSCOPY WITH PROPOFOL N/A 06/26/2016   Procedure: COLONOSCOPY WITH PROPOFOL;  Surgeon: Otis Brace, MD;  Location: [facility];  Service: Gastroenterology;  Laterality: N/A;   fractured wrist Left    LAPAROSCOPIC APPENDECTOMY N/A 07/20/2018   Procedure: APPENDECTOMY LAPAROSCOPIC;  Surgeon: Clovis Riley, MD;  Location: [facility] OR;  Service: General;  Laterality: N/A;   PANCREATECTOMY      Social History   Socioeconomic History    Marital status: Single    Spouse name: Not on file   Number of children: Not on file   Years of education: Not on file   Highest education level: Not on file  Occupational History   Not on file  Tobacco Use   Smoking status: Every Day    Packs/day: 0.50    Years: 33.00    Total pack years: 16.50    Types: Cigarettes   Smokeless tobacco: Never   Tobacco comments:    Smokes 4 packs a cigarettes a week. 04/02/2022 [place]  Vaping Use   Vaping Use: Never used  Substance and Sexual Activity   Alcohol use: Yes    Alcohol/week: 5.0 standard drinks of alcohol    Types: 5 Cans of beer per week   Drug use: No    Comment: clean for 10 years   Sexual activity: Not Currently    Birth control/protection: Post-menopausal    Comment: pt. given condoms  Other Topics Concern   Not on file  Social History Narrative   Not on file   Social Determinants of Health   Financial Resource Strain: Not on file  Food Insecurity: Food Insecurity Present (05/20/2019)   Hunger Vital Sign    Worried About Running Out of Food in the Last Year: Never true  Ran Out of Food in the Last Year: Sometimes true  Transportation Needs: No Transportation Needs (05/20/2019)   PRAPARE - Hydrologist (Medical): No    Lack of Transportation (Non-Medical): No  Physical Activity: Not on file  Stress: Not on file  Social Connections: Not on file  Intimate Partner Violence: Not on file     Allergies  Allergen Reactions   Aspirin Other (See Comments)    heart murmur     Outpatient Medications Prior to Visit  Medication Sig Dispense Refill   acetaminophen (TYLENOL) 500 MG tablet Take 1 tablet (500 mg total) by mouth every 6 (six) hours as needed. (Patient taking differently: Take 1,000 mg by mouth every 6 (six) hours as needed for moderate pain (abdominal pain).) 30 tablet 0   amLODipine (NORVASC) 5 MG tablet TAKE 1 TABLET(5 MG) BY MOUTH DAILY 90 tablet 3   bictegravir-emtricitabine-tenofovir  AF (BIKTARVY) 50-200-25 MG TABS tablet Take 1 tablet by mouth daily. 30 tablet 11   Biotin 5000 MCG TABS Take by mouth.     cetirizine (ZYRTEC) 10 MG tablet Take 1 tablet (10 mg total) by mouth daily. 30 tablet 11   Cholecalciferol (VITAMIN D3) 1000 units CAPS Take by mouth.     Ensure (ENSURE) Take 1 Can by mouth 3 (three) times daily between meals. 240 mL 11   estradiol (ESTRACE) 0.1 MG/GM vaginal cream Place 0.5g nightly for two weeks then twice a week after 30 g 11   FLUoxetine (PROZAC) 20 MG capsule Take 1 capsule (20 mg total) by mouth daily. 30 capsule 3   fluticasone (FLONASE) 50 MCG/ACT nasal spray Place 2 sprays into both nostrils daily. 16 g 6   ibuprofen (ADVIL) 600 MG tablet Take 1 tablet (600 mg total) by mouth every 8 (eight) hours as needed. 30 tablet 0   mirtazapine (REMERON) 7.5 MG tablet Take 1 tablet (7.5 mg total) by mouth at bedtime. 90 each 3   Multiple Vitamins-Minerals (MULTIVITAMIN GUMMIES ADULTS PO) Take by mouth.     Omega-3 Fatty Acids (FISH OIL) 1000 MG CAPS Take by mouth.     rosuvastatin (CRESTOR) 5 MG tablet Take 1 tablet (5 mg total) by mouth daily. 30 tablet 11   sodium chloride (OCEAN) 0.65 % SOLN nasal spray Place 1 spray into both nostrils as needed for congestion. 480 mL 3   solifenacin (VESICARE) 10 MG tablet Take 1 tablet (10 mg total) by mouth daily. 30 tablet 5   No facility-administered medications prior to visit.    Review of Systems  Constitutional:  Negative for chills, fever, malaise/fatigue and weight loss.  HENT:  Negative for hearing loss, sore throat and tinnitus.   Eyes:  Negative for blurred vision and double vision.  Respiratory:  Positive for shortness of breath. Negative for cough, hemoptysis, sputum production, wheezing and stridor.   Cardiovascular:  Negative for chest pain, palpitations, orthopnea, leg swelling and PND.  Gastrointestinal:  Negative for abdominal pain, constipation, diarrhea, heartburn, nausea and vomiting.   Genitourinary:  Negative for dysuria, hematuria and urgency.  Musculoskeletal:  Negative for joint pain and myalgias.  Skin:  Negative for itching and rash.  Neurological:  Negative for dizziness, tingling, weakness and headaches.  Endo/Heme/Allergies:  Negative for environmental allergies. Does not bruise/bleed easily.  Psychiatric/Behavioral:  Negative for depression. The patient is not nervous/anxious and does not have insomnia.   All other systems reviewed and are negative.    Objective:  Physical Exam Vitals reviewed.  Constitutional:      General: She is not in acute distress.    Appearance: She is well-developed.     Comments: Thin  HENT:     Head: Normocephalic and atraumatic.  Eyes:     General: No scleral icterus.    Conjunctiva/sclera: Conjunctivae normal.     Pupils: Pupils are equal, round, and reactive to light.  Neck:     Vascular: No JVD.     Trachea: No tracheal deviation.  Cardiovascular:     Rate and Rhythm: Normal rate and regular rhythm.     Heart sounds: Normal heart sounds. No murmur heard. Pulmonary:     Effort: Pulmonary effort is normal. No tachypnea, accessory muscle usage or respiratory distress.     Breath sounds: No stridor. No wheezing, rhonchi or rales.  Abdominal:     General: There is no distension.     Palpations: Abdomen is soft.     Tenderness: There is no abdominal tenderness.  Musculoskeletal:        General: No tenderness.     Cervical back: Neck supple.  Lymphadenopathy:     Cervical: No cervical adenopathy.  Skin:    General: Skin is warm and dry.     Capillary Refill: Capillary refill takes less than 2 seconds.     Findings: No rash.  Neurological:     Mental Status: She is alert and oriented to person, place, and time.  Psychiatric:        Behavior: Behavior normal.      Vitals:   04/02/22 0920  BP: 130/80  Pulse: 79  SpO2: 100%  Weight: 144 lb (65.3 kg)  Height: 5' 10"$  (1.778 m)   100% on RA BMI Readings from  Last 3 Encounters:  04/02/22 20.66 kg/m  02/15/22 20.52 kg/m  02/07/22 19.80 kg/m   Wt Readings from Last 3 Encounters:  04/02/22 144 lb (65.3 kg)  02/15/22 141 lb (64 kg)  02/07/22 136 lb (61.7 kg)     CBC    Component Value Date/Time   WBC 4.6 02/15/2022 1042   RBC 4.38 02/15/2022 1042   HGB 13.6 02/15/2022 1042   HGB 13.0 07/20/2019 1150   HCT 40.9 02/15/2022 1042   HCT 41.4 07/20/2019 1150   PLT 219 02/15/2022 1042   PLT 199 07/20/2019 1150   MCV 93.4 02/15/2022 1042   MCV 92 07/20/2019 1150   MCH 31.1 02/15/2022 1042   MCHC 33.3 02/15/2022 1042   RDW 11.6 02/15/2022 1042   RDW 11.9 07/20/2019 1150   LYMPHSABS 1,302 02/15/2022 1042   LYMPHSABS 1.3 07/20/2019 1150   MONOABS 0.7 07/20/2018 1419   EOSABS 110 02/15/2022 1042   EOSABS 0.1 07/20/2019 1150   BASOSABS 9 02/15/2022 1042   BASOSABS 0.0 07/20/2019 1150     Chest Imaging: Lung cancer screening CT January 2024: Right upper lobe pulmonary nodule 21 mm, subpleural in nature. The patient's images have been independently reviewed by me.    Pulmonary Functions Testing Results:     No data to display          FeNO:   Pathology:   Echocardiogram:   Heart Catheterization:     Assessment & Plan:     ICD-10-CM   1. Lung nodule  R91.1 NM PET Image Initial (PI) Skull Base To Thigh (F-18 FDG)    Pulmonary Function Test    2. Encounter for smoking cessation counseling  Z71.6     3. Human immunodeficiency virus (HIV) disease (  [place])  B20     4. Tobacco use  Z72.0       Discussion:  This is a 62 year old female, longstanding history of tobacco use, she also has HIV.  Both of which predispose her for the development of lung cancer.  She has an abnormal lung cancer screening CT with a posterior right upper lobe nodule.  Plan: Due to the size of the nodule and the patient's risk factors for development of malignancy I think next best step is a nuclear medicine PET scan. Patient is agreeable to  proceed. Also noted longstanding history of smoking pulmonary function tests would be useful to determine whether or not she would be a surgical candidate in case this nodule needs to be resected. We will obtain pulmonary function test as well as PET scan imaging and have her follow-up in the office after this is complete. Patient was counseled on smoking cessation and given various techniques to help with that.  We talked about the pros and cons of use of Chantix however is like to hold off at this time. RTC in 3-4 weeks after PFTs and PET scan complete.   Current Outpatient Medications:    acetaminophen (TYLENOL) 500 MG tablet, Take 1 tablet (500 mg total) by mouth every 6 (six) hours as needed. (Patient taking differently: Take 1,000 mg by mouth every 6 (six) hours as needed for moderate pain (abdominal pain).), Disp: 30 tablet, Rfl: 0   amLODipine (NORVASC) 5 MG tablet, TAKE 1 TABLET(5 MG) BY MOUTH DAILY, Disp: 90 tablet, Rfl: 3   bictegravir-emtricitabine-tenofovir AF (BIKTARVY) 50-200-25 MG TABS tablet, Take 1 tablet by mouth daily., Disp: 30 tablet, Rfl: 11   Biotin 5000 MCG TABS, Take by mouth., Disp: , Rfl:    cetirizine (ZYRTEC) 10 MG tablet, Take 1 tablet (10 mg total) by mouth daily., Disp: 30 tablet, Rfl: 11   Cholecalciferol (VITAMIN D3) 1000 units CAPS, Take by mouth., Disp: , Rfl:    Ensure (ENSURE), Take 1 Can by mouth 3 (three) times daily between meals., Disp: 240 mL, Rfl: 11   estradiol (ESTRACE) 0.1 MG/GM vaginal cream, Place 0.5g nightly for two weeks then twice a week after, Disp: 30 g, Rfl: 11   FLUoxetine (PROZAC) 20 MG capsule, Take 1 capsule (20 mg total) by mouth daily., Disp: 30 capsule, Rfl: 3   fluticasone (FLONASE) 50 MCG/ACT nasal spray, Place 2 sprays into both nostrils daily., Disp: 16 g, Rfl: 6   ibuprofen (ADVIL) 600 MG tablet, Take 1 tablet (600 mg total) by mouth every 8 (eight) hours as needed., Disp: 30 tablet, Rfl: 0   mirtazapine (REMERON) 7.5 MG tablet,  Take 1 tablet (7.5 mg total) by mouth at bedtime., Disp: 90 each, Rfl: 3   Multiple Vitamins-Minerals (MULTIVITAMIN GUMMIES ADULTS PO), Take by mouth., Disp: , Rfl:    Omega-3 Fatty Acids (FISH OIL) 1000 MG CAPS, Take by mouth., Disp: , Rfl:    rosuvastatin (CRESTOR) 5 MG tablet, Take 1 tablet (5 mg total) by mouth daily., Disp: 30 tablet, Rfl: 11   sodium chloride (OCEAN) 0.65 % SOLN nasal spray, Place 1 spray into both nostrils as needed for congestion., Disp: 480 mL, Rfl: 3   solifenacin (VESICARE) 10 MG tablet, Take 1 tablet (10 mg total) by mouth daily., Disp: 30 tablet, Rfl: 5   Garner Nash, DO [facility] 04/02/2022 9:50 AM

## 2022-04-13 ENCOUNTER — Encounter (HOSPITAL_COMMUNITY)
Admission: RE | Admit: 2022-04-13 | Discharge: 2022-04-13 | Disposition: A | Discharge status: Home / Self Care | Payer: BC Managed Care – PPO | Source: Ambulatory Visit | Attending: Pulmonary Disease | Admitting: Pulmonary Disease

## 2022-04-13 DIAGNOSIS — R911 Solitary pulmonary nodule: Secondary | ICD-10-CM | POA: Diagnosis present

## 2022-04-13 LAB — GLUCOSE, CAPILLARY: Glucose-Capillary: 86 mg/dL (ref 70–99)

## 2022-04-13 MED ORDER — FLUDEOXYGLUCOSE F - 18 (FDG) INJECTION
7.2000 | Freq: Once | INTRAVENOUS | Status: AC
  Administered 2022-04-13: 7.1 via INTRAVENOUS

## 2022-04-16 ENCOUNTER — Encounter: Payer: Self-pay | Admitting: Infectious Diseases

## 2022-04-16 ENCOUNTER — Other Ambulatory Visit (HOSPITAL_COMMUNITY)
Admission: RE | Admit: 2022-04-16 | Discharge: 2022-04-16 | Disposition: A | Discharge status: Home / Self Care | Payer: BC Managed Care – PPO | Source: Ambulatory Visit | Attending: Infectious Diseases | Admitting: Infectious Diseases

## 2022-04-16 ENCOUNTER — Other Ambulatory Visit: Payer: Self-pay

## 2022-04-16 ENCOUNTER — Ambulatory Visit (INDEPENDENT_AMBULATORY_CARE_PROVIDER_SITE_OTHER): Payer: BC Managed Care – PPO | Admitting: Infectious Diseases

## 2022-04-16 VITALS — BP 123/82 | HR 71 | Temp 97.6°F

## 2022-04-16 DIAGNOSIS — Z124 Encounter for screening for malignant neoplasm of cervix: Secondary | ICD-10-CM | POA: Insufficient documentation

## 2022-04-16 DIAGNOSIS — R634 Abnormal weight loss: Secondary | ICD-10-CM | POA: Insufficient documentation

## 2022-04-16 NOTE — Assessment & Plan Note (Signed)
She has had some weight loss - CT scan and PET scan results she saw on her MyChart reviewed today. Discussed she will get further clarification as to whether biopsy vs timed follow up imaging is necessary for her care next. She is aware of her follow up with Dr. Valeta Harms in 2 weeks and plans to attend for further decisions of care as it relates to this.

## 2022-04-16 NOTE — Assessment & Plan Note (Signed)
Normal pelvic exam. Normal bimanual exam.  Cervical brushing collected for cytology and HPV.  Discussed recommended screening interval for women living with HIV disease meant to be lifelong and at an interval of Q1-3 years pending results. Acceptable to space out Q3y with consecutively normal exams.  Results will be communicated to the patient via mychart

## 2022-04-16 NOTE — Progress Notes (Signed)
Subjective :    [name] is a 62 y.o. female here for an annual pelvic exam and pap smear.    Last pap smear 2019 with normal cytology, negative HPV.  Had screening lung CT for lung Ca and went on to PET for further characterization which she does not understand the results of. Has had weight loss and seeing pulmonology (Dr. Valeta Harms) with appt coming up soon 3/18.    Review of Systems: Current GYN complaints or concerns: no GYN concerns  Patient denies any abdominal/pelvic pain, problems with bowel movements, urination, vaginal discharge or intercourse.    Past Medical History:  Diagnosis Date   Anxiety 11/2018   Asthma    Hepatitis C    Has been treated   HIV (human immunodeficiency virus infection) ([place])    Hypertension    STD (sexually transmitted disease)    Substance abuse ([place])    Underweight 07/29/2014    Gynecologic History: [id number]  Patient's last menstrual period was 03/27/2011. Contraception: abstinence Last Pap: 2019. Results were: normal Last Mammogram: done 2023. Results were: normal    Objective :   Physical Exam - chaperone present  Constitutional: Well developed, well nourished, no acute distress. She is alert and oriented x3.  Pelvic: External genitalia is normal in appearance. The vagina is normal in appearance. The cervix is bulbous and easily visualized. No CMT, normal expected cervical mucus present. Psych: She has a normal mood and affect.      Assessment & Plan:    Patient Active Problem List   Diagnosis Date Noted   Screening for cervical cancer 04/16/2022   Weight loss 04/16/2022   Nonintractable headache 08/10/2021   Cystocele with prolapse 07/21/2020   Callus of foot 12/12/2016   Multiple renal cysts 08/14/2016   Constipation 11/30/2014   Essential hypertension 11/30/2014   Hot flashes, menopausal 01/27/2014   Asthma 01/23/2011   HEADACHE 04/14/2008   CHOLELITHIASIS 12/15/2007   PERIPHERAL NEUROPATHY  11/07/2007   Avulsion fracture of tooth 07/23/2007   Depression 07/24/2006   Human immunodeficiency virus (HIV) disease ([place]) 12/14/2005   MACROCYTIC ANEMIA 12/14/2005   TOBACCO ABUSE 12/14/2005   SUBSTANCE ABUSE 12/14/2005   GERD 12/14/2005   PNEUMONIA, HX OF 12/14/2005   COLPOSCOPY, HX OF 12/14/2005    Problem List Items Addressed This Visit       Unprioritized   Screening for cervical cancer - Primary    Normal pelvic exam. Normal bimanual exam.  Cervical brushing collected for cytology and HPV.  Discussed recommended screening interval for women living with HIV disease meant to be lifelong and at an interval of Q1-3 years pending results. Acceptable to space out Q3y with consecutively normal exams.  Results will be communicated to the patient via mychart       Relevant Orders   Cytology - PAP( [place])   Weight loss    She has had some weight loss - CT scan and PET scan results she saw on her MyChart reviewed today. Discussed she will get further clarification as to whether biopsy vs timed follow up imaging is necessary for her care next. She is aware of her follow up with Dr. Valeta Harms in 2 weeks and plans to attend for further decisions of care as it relates to this.        FU with Dr. Baxter Flattery as scheduled.   Janene Madeira, MSN, NP-C [facility] Office: [phone] Pager: [phone]  04/16/22 11:23 AM

## 2022-04-18 ENCOUNTER — Ambulatory Visit (INDEPENDENT_AMBULATORY_CARE_PROVIDER_SITE_OTHER): Payer: BC Managed Care – PPO | Admitting: Pulmonary Disease

## 2022-04-18 DIAGNOSIS — R911 Solitary pulmonary nodule: Secondary | ICD-10-CM | POA: Diagnosis not present

## 2022-04-18 LAB — PULMONARY FUNCTION TEST
DL/VA % pred: 104 %
DL/VA: 4.14 ml/min/mmHg/L
DLCO cor % pred: 2 %
DLCO cor: 0.69 ml/min/mmHg
DLCO unc % pred: 2 %
DLCO unc: 0.69 ml/min/mmHg
FEF 25-75 Post: 0.31 L/sec
FEF 25-75 Pre: 1.12 L/sec
FEF2575-%Change-Post: -71 %
FEF2575-%Pred-Post: 11 %
FEF2575-%Pred-Pre: 39 %
FEV1-%Change-Post: -31 %
FEV1-%Pred-Post: 34 %
FEV1-%Pred-Pre: 49 %
FEV1-Post: 1.14 L
FEV1-Pre: 1.66 L
FEV1FVC-%Change-Post: -42 %
FEV1FVC-%Pred-Pre: 79 %
FEV6-%Change-Post: -14 %
FEV6-%Pred-Post: 52 %
FEV6-%Pred-Pre: 61 %
FEV6-Post: 2.2 L
FEV6-Pre: 2.57 L
FEV6FVC-%Change-Post: -30 %
FEV6FVC-%Pred-Post: 71 %
FEV6FVC-%Pred-Pre: 102 %
FVC-%Change-Post: 18 %
FVC-%Pred-Post: 73 %
FVC-%Pred-Pre: 62 %
FVC-Post: 3.19 L
FVC-Pre: 2.69 L
Post FEV1/FVC ratio: 36 %
Post FEV6/FVC ratio: 69 %
Pre FEV1/FVC ratio: 62 %
Pre FEV6/FVC Ratio: 99 %
RV % pred: 77 %
RV: 1.86 L
TLC % pred: 124 %
TLC: 7.78 L

## 2022-04-18 LAB — CYTOLOGY - PAP
Chlamydia: NEGATIVE
Comment: NEGATIVE
Comment: NEGATIVE
Comment: NORMAL
Diagnosis: NEGATIVE
High risk HPV: NEGATIVE
Neisseria Gonorrhea: NEGATIVE

## 2022-04-18 NOTE — Progress Notes (Signed)
Full PFT performed today. Patient had a hard time with DLCO.

## 2022-04-18 NOTE — Patient Instructions (Signed)
Full PFT performed today. Patient had a hard time with DLCO.

## 2022-04-23 ENCOUNTER — Ambulatory Visit (INDEPENDENT_AMBULATORY_CARE_PROVIDER_SITE_OTHER): Payer: BC Managed Care – PPO | Admitting: Obstetrics and Gynecology

## 2022-04-23 ENCOUNTER — Encounter: Payer: Self-pay | Admitting: Obstetrics and Gynecology

## 2022-04-23 VITALS — BP 115/79 | HR 76

## 2022-04-23 DIAGNOSIS — N3281 Overactive bladder: Secondary | ICD-10-CM

## 2022-04-23 DIAGNOSIS — N812 Incomplete uterovaginal prolapse: Secondary | ICD-10-CM | POA: Diagnosis not present

## 2022-04-23 NOTE — Patient Instructions (Signed)
Continue Vesicare '10mg'$  daily for OAB.

## 2022-04-23 NOTE — Progress Notes (Signed)
[facility] Return Visit  SUBJECTIVE  History of Present Illness: [name] is a 62 y.o. female seen in follow-up for Vesicare '10mg'$  daily. Plan at last visit was start Vesicare '10mg'$  daily for OAB.   Reports she has experienced some mild constipation. Is normally able to manage this with some coffee.   Urinating about 8 times a day now. Reports a decrease in urinary urgency. Overall is doing well.      Past Medical History: Patient  has a past medical history of Anxiety (11/2018), Asthma, Hepatitis C, HIV (human immunodeficiency virus infection) ([place]), Hypertension, STD (sexually transmitted disease), Substance abuse ([place]), and Underweight (07/29/2014).   Past Surgical History: She  has a past surgical history that includes Colonoscopy with propofol (N/A, 06/26/2016); laparoscopic appendectomy (N/A, 07/20/2018); Pancreatectomy; Bunionectomy (Left, 03/15/2020); and fractured wrist (Left).   Medications: She has a current medication list which includes the following prescription(s): acetaminophen, amlodipine, biktarvy, biotin, cetirizine, vitamin d3, ensure, estradiol, fluoxetine, fluticasone, ibuprofen, mirtazapine, multiple vitamins-minerals, fish oil, rosuvastatin, sodium chloride, and solifenacin.   Allergies: Patient is allergic to aspirin.   Social History: Patient  reports that she has been smoking cigarettes. She has a 16.50 pack-year smoking history. She has never used smokeless tobacco. She reports current alcohol use of about 5.0 standard drinks of alcohol per week. She reports that she does not use drugs.      OBJECTIVE     Physical Exam: Vitals:   04/23/22 1035  BP: 115/79  Pulse: 76   Gen: No apparent distress, A&O x 3.  Detailed Urogynecologic Evaluation:  Deferred.    ASSESSMENT AND PLAN    [name] is a 62 y.o. with:  1. Overactive bladder   2. Uterovaginal prolapse, incomplete    Patient reports the medication is working well  for her with minimal side effects. Patient reports she would like to stay on the medication.  Patient states she is not yet ready to plan for surgery but will let us know when she is ready.   Patient to return in 6 months or sooner if needed.

## 2022-04-27 ENCOUNTER — Encounter: Payer: Self-pay | Admitting: Nurse Practitioner

## 2022-04-27 ENCOUNTER — Ambulatory Visit (INDEPENDENT_AMBULATORY_CARE_PROVIDER_SITE_OTHER): Payer: BC Managed Care – PPO | Admitting: Nurse Practitioner

## 2022-04-27 DIAGNOSIS — R519 Headache, unspecified: Secondary | ICD-10-CM | POA: Diagnosis not present

## 2022-04-27 DIAGNOSIS — I10 Essential (primary) hypertension: Secondary | ICD-10-CM | POA: Diagnosis not present

## 2022-04-27 MED ORDER — AMLODIPINE BESYLATE 5 MG PO TABS: ORAL_TABLET | ORAL | 3 refills | Status: DC

## 2022-04-27 MED ORDER — ROSUVASTATIN CALCIUM 5 MG PO TABS: 5.0000 mg | ORAL_TABLET | Freq: Every day | ORAL | 11 refills | Status: DC

## 2022-04-27 MED ORDER — CETIRIZINE HCL 10 MG PO TABS: 10.0000 mg | ORAL_TABLET | Freq: Every day | ORAL | 11 refills | Status: DC

## 2022-04-27 NOTE — Patient Instructions (Addendum)
1. Essential hypertension   - amLODipine (NORVASC) 5 MG tablet; TAKE 1 TABLET(5 MG) BY MOUTH DAILY  Dispense: 90 tablet; Refill: 3   2. Nonintractable headache, unspecified chronicity pattern, unspecified headache type   - cetirizine (ZYRTEC) 10 MG tablet; Take 1 tablet (10 mg total) by mouth daily.  Dispense: 30 tablet; Refill: 11    Follow up:  Follow up in 6 months

## 2022-04-27 NOTE — Progress Notes (Signed)
@Patient  ID: [name], female    DOB: 1960-09-22, 62 y.o.   MRN: [id number]  Chief Complaint  Patient presents with   Hypertension    Follow med were taken today    Referring provider: Fenton Foy, NP   HPI   62 year old female with history of HIV, syphilis, GERD hep C    Patient presents for a follow-up on blood pressure.  She is trying to follow a low-salt diet.  She is staying active.  Overall no new issues or concerns today. Denies f/c/s, n/v/d, hemoptysis, PND, leg swelling. Denies chest pain or edema.      Allergies  Allergen Reactions   Aspirin Other (See Comments)    heart murmur    Immunization History  Administered Date(s) Administered   COVID-19, mRNA, vaccine(Comirnaty)12 years and older 02/15/2022   Hepatitis A 03/06/2002   Hepatitis B 01/05/2003, 02/04/2003, 07/01/2003   Influenza Split 12/01/2010, 12/05/2011   Influenza Whole 10/24/2005, 11/07/2007, 12/01/2008, 11/30/2009   Influenza,inj,Quad PF,6+ Mos 12/31/2012, 09/30/2013, 11/30/2014, 11/14/2015, 12/12/2016, 11/26/2017, 11/10/2018, 10/22/2019, 11/16/2021, 12/14/2021   PFIZER(Purple Top)SARS-COV-2 Vaccination 05/14/2019, 06/09/2019, 12/07/2019   Pneumococcal Conjugate-13 07/03/2017   Pneumococcal Polysaccharide-23 03/14/2010, 11/14/2015   Tdap 06/19/2016    Past Medical History:  Diagnosis Date   Anxiety 11/2018   Asthma    Hepatitis C    Has been treated   HIV (human immunodeficiency virus infection) ([place])    Hypertension    STD (sexually transmitted disease)    Substance abuse ([place])    Underweight 07/29/2014    Tobacco History: Social History   Tobacco Use  Smoking Status Every Day   Packs/day: 0.50   Years: 33.00   Additional pack years: 0.00   Total pack years: 16.50   Types: Cigarettes  Smokeless Tobacco Never  Tobacco Comments   Smokes 4 packs a cigarettes a week. 04/02/2022 [place]   Ready to quit: Not Answered Counseling given: Not Answered Tobacco  comments: Smokes 4 packs a cigarettes a week. 04/02/2022 [place]   Outpatient Encounter Medications as of 04/27/2022  Medication Sig   acetaminophen (TYLENOL) 500 MG tablet Take 1 tablet (500 mg total) by mouth every 6 (six) hours as needed. (Patient taking differently: Take 1,000 mg by mouth every 6 (six) hours as needed for moderate pain (abdominal pain).)   bictegravir-emtricitabine-tenofovir AF (BIKTARVY) 50-200-25 MG TABS tablet Take 1 tablet by mouth daily.   Biotin 5000 MCG TABS Take by mouth.   Cholecalciferol (VITAMIN D3) 1000 units CAPS Take by mouth.   Ensure (ENSURE) Take 1 Can by mouth 3 (three) times daily between meals.   estradiol (ESTRACE) 0.1 MG/GM vaginal cream Place 0.5g nightly for two weeks then twice a week after   fluticasone (FLONASE) 50 MCG/ACT nasal spray Place 2 sprays into both nostrils daily.   ibuprofen (ADVIL) 600 MG tablet Take 1 tablet (600 mg total) by mouth every 8 (eight) hours as needed.   Multiple Vitamins-Minerals (MULTIVITAMIN GUMMIES ADULTS PO) Take by mouth.   Omega-3 Fatty Acids (FISH OIL) 1000 MG CAPS Take by mouth.   sodium chloride (OCEAN) 0.65 % SOLN nasal spray Place 1 spray into both nostrils as needed for congestion.   [DISCONTINUED] amLODipine (NORVASC) 5 MG tablet TAKE 1 TABLET(5 MG) BY MOUTH DAILY   [DISCONTINUED] cetirizine (ZYRTEC) 10 MG tablet Take 1 tablet (10 mg total) by mouth daily.   [DISCONTINUED] rosuvastatin (CRESTOR) 5 MG tablet Take 1 tablet (5 mg total) by mouth daily.   amLODipine (NORVASC) 5 MG  tablet TAKE 1 TABLET(5 MG) BY MOUTH DAILY   cetirizine (ZYRTEC) 10 MG tablet Take 1 tablet (10 mg total) by mouth daily.   FLUoxetine (PROZAC) 20 MG capsule Take 1 capsule (20 mg total) by mouth daily. (Patient not taking: Reported on 04/27/2022)   mirtazapine (REMERON) 7.5 MG tablet Take 1 tablet (7.5 mg total) by mouth at bedtime. (Patient not taking: Reported on 04/27/2022)   rosuvastatin (CRESTOR) 5 MG tablet Take 1 tablet (5 mg total) by  mouth daily.   solifenacin (VESICARE) 10 MG tablet Take 1 tablet (10 mg total) by mouth daily. (Patient not taking: Reported on 04/27/2022)   No facility-administered encounter medications on file as of 04/27/2022.     Review of Systems  Review of Systems  Constitutional: Negative.   HENT: Negative.    Cardiovascular: Negative.   Gastrointestinal: Negative.   Allergic/Immunologic: Negative.   Neurological: Negative.   Psychiatric/Behavioral: Negative.         Physical Exam  BP 114/76   Pulse 81   Temp (!) 97.5 F (36.4 C)   Ht 5\' 10"  (1.778 m)   Wt 144 lb 9.6 oz (65.6 kg)   LMP 03/27/2011   SpO2 100%   BMI 20.75 kg/m   Wt Readings from Last 5 Encounters:  04/27/22 144 lb 9.6 oz (65.6 kg)  04/02/22 144 lb (65.3 kg)  02/15/22 141 lb (64 kg)  02/07/22 136 lb (61.7 kg)  01/25/22 147 lb (66.7 kg)     Physical Exam Vitals and nursing note reviewed.  Constitutional:      General: She is not in acute distress.    Appearance: She is well-developed.  Cardiovascular:     Rate and Rhythm: Normal rate and regular rhythm.  Pulmonary:     Effort: Pulmonary effort is normal.     Breath sounds: Normal breath sounds.  Neurological:     Mental Status: She is alert and oriented to person, place, and time.       Assessment & Plan:   Essential hypertension - amLODipine (NORVASC) 5 MG tablet; TAKE 1 TABLET(5 MG) BY MOUTH DAILY  Dispense: 90 tablet; Refill: 3   2. Nonintractable headache, unspecified chronicity pattern, unspecified headache type   - cetirizine (ZYRTEC) 10 MG tablet; Take 1 tablet (10 mg total) by mouth daily.  Dispense: 30 tablet; Refill: 11    Follow up:  Follow up in 6 months     Fenton Foy, NP 04/27/2022

## 2022-04-27 NOTE — Assessment & Plan Note (Signed)
-   amLODipine (NORVASC) 5 MG tablet; TAKE 1 TABLET(5 MG) BY MOUTH DAILY  Dispense: 90 tablet; Refill: 3   2. Nonintractable headache, unspecified chronicity pattern, unspecified headache type   - cetirizine (ZYRTEC) 10 MG tablet; Take 1 tablet (10 mg total) by mouth daily.  Dispense: 30 tablet; Refill: 11    Follow up:  Follow up in 6 months

## 2022-04-30 ENCOUNTER — Ambulatory Visit (INDEPENDENT_AMBULATORY_CARE_PROVIDER_SITE_OTHER): Payer: BC Managed Care – PPO | Admitting: Pulmonary Disease

## 2022-04-30 ENCOUNTER — Encounter: Payer: Self-pay | Admitting: Pulmonary Disease

## 2022-04-30 VITALS — BP 130/80 | HR 61 | Ht 71.0 in | Wt 146.0 lb

## 2022-04-30 DIAGNOSIS — Z716 Tobacco abuse counseling: Secondary | ICD-10-CM | POA: Diagnosis not present

## 2022-04-30 DIAGNOSIS — B20 Human immunodeficiency virus [HIV] disease: Secondary | ICD-10-CM | POA: Diagnosis not present

## 2022-04-30 DIAGNOSIS — R911 Solitary pulmonary nodule: Secondary | ICD-10-CM | POA: Diagnosis not present

## 2022-04-30 DIAGNOSIS — Z720 Tobacco use: Secondary | ICD-10-CM

## 2022-04-30 MED ORDER — TRELEGY ELLIPTA 100-62.5-25 MCG/ACT IN AEPB: 1.0000 | INHALATION_SPRAY | Freq: Every day | RESPIRATORY_TRACT | 6 refills | Status: DC

## 2022-04-30 NOTE — Progress Notes (Signed)
Please see office note from today

## 2022-04-30 NOTE — Telephone Encounter (Signed)
Sent pt a mychart message regarding her PFT's and prescription for Trelegy being sent to her pharmacy.

## 2022-04-30 NOTE — Patient Instructions (Addendum)
Thank you for visiting Dr. Valeta Harms at [facility]. Today we recommend the following: Orders Placed This Encounter  Procedures   CT Chest Wo Contrast   See [place] after your ct chest in September 2024  Return in about 6 months (around 10/31/2022) for with Eric Form, NP, or Dr. Valeta Harms.    Please do your part to reduce the spread of COVID-19.

## 2022-04-30 NOTE — Progress Notes (Signed)
Synopsis: Referred in February 2024 for pulmonary nodule by Fenton Foy, NP  Subjective:   PATIENT ID: Tabitha [name] DOB: [birth year], MRN: [id number]  Chief Complaint  Patient presents with   Follow-up    F/up PET    This is a 62 year old [name], past medical history of anxiety, hepatitis C, HIV, mother with a history of bone malignancy.  Patient is a current every day smoker for the past 30+ years.  She had a lung cancer screening CT completed that revealed a posterior right upper lobe nodule.  From respiratory standpoint she has no complaints.  She has been actively trying to reduce her smoking.  She has cut down to 8 cigarettes today.  Today we talked about various ways to decrease smoking cessation.  We also gave her some information to help with cost of nicotine replacement patches.  OV 04/30/2022: Here today for follow-up after recent PET scan imaging.Patient had pet imaging that had platelike subpleural consolidation of the right upper lobe with a max SUV of 1.9.  This was favored to be a rounded area of atelectasis and scarring however indolent neoplasm cannot be excluded.  She had no hypermetabolic adenopathy within the chest.  She also found to have a renal cyst that will warrant follow-up imaging.  Patient has no respiratory complaints.  She is looking for a new job.    Past Medical History:  Diagnosis Date   Anxiety 11/2018   Asthma    Hepatitis C    Has been treated   HIV (human immunodeficiency virus infection) ([place])    Hypertension    STD (sexually transmitted disease)    Substance abuse ([place])    Underweight 07/29/2014     Family History  Problem Relation Age of Onset   Cancer Mother        bone   Diabetes Sister    Colon cancer Maternal Uncle    Esophageal cancer Neg Hx    Gastric cancer Neg Hx    Pancreatic cancer Neg Hx    Liver disease Neg Hx    Inflammatory bowel disease Neg Hx      Past Surgical History:  Procedure  Laterality Date   BUNIONECTOMY Left 03/15/2020   COLONOSCOPY WITH PROPOFOL N/A 06/26/2016   Procedure: COLONOSCOPY WITH PROPOFOL;  Surgeon: Otis Brace, MD;  Location: [facility];  Service: Gastroenterology;  Laterality: N/A;   fractured wrist Left    LAPAROSCOPIC APPENDECTOMY N/A 07/20/2018   Procedure: APPENDECTOMY LAPAROSCOPIC;  Surgeon: Clovis Riley, MD;  Location: [facility];  Service: General;  Laterality: N/A;   PANCREATECTOMY      Social History   Socioeconomic History   Marital status: Single    Spouse name: Not on file   Number of children: Not on file   Years of education: Not on file   Highest education level: Not on file  Occupational History   Not on file  Tobacco Use   Smoking status: Every Day    Packs/day: 0.50    Years: 33.00    Additional pack years: 0.00    Total pack years: 16.50    Types: Cigarettes   Smokeless tobacco: Never   Tobacco comments:    Smokes 4 packs a cigarettes a week. 04/30/2022 Tay  Vaping Use   Vaping Use: Never used  Substance and Sexual Activity   Alcohol use: Yes    Alcohol/week: 5.0 standard drinks of alcohol    Types: 5 Cans of beer per week  Drug use: No    Comment: clean for 10 years   Sexual activity: Not Currently    Birth control/protection: Post-menopausal    Comment: pt. given condoms  Other Topics Concern   Not on file  Social History Narrative   Not on file   Social Determinants of Health   Financial Resource Strain: Not on file  Food Insecurity: Food Insecurity Present (05/20/2019)   Hunger Vital Sign    Worried About Running Out of Food in the Last Year: Never true    Ran Out of Food in the Last Year: Sometimes true  Transportation Needs: No Transportation Needs (05/20/2019)   PRAPARE - Hydrologist (Medical): No    Lack of Transportation (Non-Medical): No  Physical Activity: Not on file  Stress: Not on file  Social Connections: Not on file  Intimate Partner Violence:  Not on file     Allergies  Allergen Reactions   Aspirin Other (See Comments)    heart murmur     Outpatient Medications Prior to Visit  Medication Sig Dispense Refill   acetaminophen (TYLENOL) 500 MG tablet Take 1 tablet (500 mg total) by mouth every 6 (six) hours as needed. (Patient taking differently: Take 1,000 mg by mouth every 6 (six) hours as needed for moderate pain (abdominal pain).) 30 tablet 0   amLODipine (NORVASC) 5 MG tablet TAKE 1 TABLET(5 MG) BY MOUTH DAILY 90 tablet 3   bictegravir-emtricitabine-tenofovir AF (BIKTARVY) 50-200-25 MG TABS tablet Take 1 tablet by mouth daily. 30 tablet 11   Biotin 5000 MCG TABS Take by mouth.     cetirizine (ZYRTEC) 10 MG tablet Take 1 tablet (10 mg total) by mouth daily. 30 tablet 11   Cholecalciferol (VITAMIN D3) 1000 units CAPS Take by mouth.     Ensure (ENSURE) Take 1 Can by mouth 3 (three) times daily between meals. 240 mL 11   estradiol (ESTRACE) 0.1 MG/GM vaginal cream Place 0.5g nightly for two weeks then twice a week after 30 g 11   fluticasone (FLONASE) 50 MCG/ACT nasal spray Place 2 sprays into both nostrils daily. 16 g 6   ibuprofen (ADVIL) 600 MG tablet Take 1 tablet (600 mg total) by mouth every 8 (eight) hours as needed. 30 tablet 0   Multiple Vitamins-Minerals (MULTIVITAMIN GUMMIES ADULTS PO) Take by mouth.     Omega-3 Fatty Acids (FISH OIL) 1000 MG CAPS Take by mouth.     rosuvastatin (CRESTOR) 5 MG tablet Take 1 tablet (5 mg total) by mouth daily. 30 tablet 11   sodium chloride (OCEAN) 0.65 % SOLN nasal spray Place 1 spray into both nostrils as needed for congestion. 480 mL 3   FLUoxetine (PROZAC) 20 MG capsule Take 1 capsule (20 mg total) by mouth daily. (Patient not taking: Reported on 04/27/2022) 30 capsule 3   mirtazapine (REMERON) 7.5 MG tablet Take 1 tablet (7.5 mg total) by mouth at bedtime. (Patient not taking: Reported on 04/27/2022) 90 each 3   solifenacin (VESICARE) 10 MG tablet Take 1 tablet (10 mg total) by mouth  daily. (Patient not taking: Reported on 04/27/2022) 30 tablet 5   No facility-administered medications prior to visit.    Review of Systems  Constitutional:  Negative for chills, fever, malaise/fatigue and weight loss.  HENT:  Negative for hearing loss, sore throat and tinnitus.   Eyes:  Negative for blurred vision and double vision.  Respiratory:  Negative for cough, hemoptysis, sputum production, shortness of breath, wheezing and stridor.  Cardiovascular:  Negative for chest pain, palpitations, orthopnea, leg swelling and PND.  Gastrointestinal:  Negative for abdominal pain, constipation, diarrhea, heartburn, nausea and vomiting.  Genitourinary:  Negative for dysuria, hematuria and urgency.  Musculoskeletal:  Negative for joint pain and myalgias.  Skin:  Negative for itching and rash.  Neurological:  Negative for dizziness, tingling, weakness and headaches.  Endo/Heme/Allergies:  Negative for environmental allergies. Does not bruise/bleed easily.  Psychiatric/Behavioral:  Negative for depression. The patient is not nervous/anxious and does not have insomnia.   All other systems reviewed and are negative.    Objective:  Physical Exam Vitals reviewed.  Constitutional:      General: She is not in acute distress.    Appearance: She is well-developed.  HENT:     Head: Normocephalic and atraumatic.  Eyes:     General: No scleral icterus.    Conjunctiva/sclera: Conjunctivae normal.     Pupils: Pupils are equal, round, and reactive to light.  Neck:     Vascular: No JVD.     Trachea: No tracheal deviation.  Cardiovascular:     Rate and Rhythm: Normal rate and regular rhythm.     Heart sounds: Normal heart sounds. No murmur heard. Pulmonary:     Effort: Pulmonary effort is normal. No tachypnea, accessory muscle usage or respiratory distress.     Breath sounds: No stridor. No wheezing, rhonchi or rales.  Abdominal:     General: Bowel sounds are normal. There is no distension.      Palpations: Abdomen is soft.     Tenderness: There is no abdominal tenderness.  Musculoskeletal:        General: No tenderness.     Cervical back: Neck supple.  Lymphadenopathy:     Cervical: No cervical adenopathy.  Skin:    General: Skin is warm and dry.     Capillary Refill: Capillary refill takes less than 2 seconds.     Findings: No rash.  Neurological:     Mental Status: She is alert and oriented to person, place, and time.  Psychiatric:        Behavior: Behavior normal.      Vitals:   04/30/22 0954  BP: 130/80  Pulse: 61  SpO2: 100%  Weight: 146 lb (66.2 kg)  Height: 5\' 11"  (1.803 m)    100% on RA BMI Readings from Last 3 Encounters:  04/30/22 20.36 kg/m  04/27/22 20.75 kg/m  04/02/22 20.66 kg/m   Wt Readings from Last 3 Encounters:  04/30/22 146 lb (66.2 kg)  04/27/22 144 lb 9.6 oz (65.6 kg)  04/02/22 144 lb (65.3 kg)     CBC    Component Value Date/Time   WBC 4.6 02/15/2022 1042   RBC 4.38 02/15/2022 1042   HGB 13.6 02/15/2022 1042   HGB 13.0 07/20/2019 1150   HCT 40.9 02/15/2022 1042   HCT 41.4 07/20/2019 1150   PLT 219 02/15/2022 1042   PLT 199 07/20/2019 1150   MCV 93.4 02/15/2022 1042   MCV 92 07/20/2019 1150   MCH 31.1 02/15/2022 1042   MCHC 33.3 02/15/2022 1042   RDW 11.6 02/15/2022 1042   RDW 11.9 07/20/2019 1150   LYMPHSABS 1,302 02/15/2022 1042   LYMPHSABS 1.3 07/20/2019 1150   MONOABS 0.7 07/20/2018 1419   EOSABS 110 02/15/2022 1042   EOSABS 0.1 07/20/2019 1150   BASOSABS 9 02/15/2022 1042   BASOSABS 0.0 07/20/2019 1150     Chest Imaging: Lung cancer screening CT January 2024: Right upper lobe  pulmonary nodule 21 mm, subpleural in nature. The patient's images have been independently reviewed by me.    March 2024 pet imaging: No significant metabolic uptake within the right upper lobe lesion of concern. Reflecting possibly atelectasis/scarring.  Underlying neoplasm not excluded. The patient's images have been  independently reviewed by me.    Pulmonary Functions Testing Results:    Latest Ref Rng & Units 04/18/2022   10:47 AM  PFT Results  FVC-Pre L 2.69  P  FVC-Predicted Pre % 62  P  FVC-Post L 3.19  P  FVC-Predicted Post % 73  P  Pre FEV1/FVC % % 62  P  Post FEV1/FCV % % 36  P  FEV1-Pre L 1.66  P  FEV1-Predicted Pre % 49  P  FEV1-Post L 1.14  P  DLCO uncorrected ml/min/mmHg 0.69  P  DLCO UNC% % 2  P  DLCO corrected ml/min/mmHg 0.69  P  DLCO COR %Predicted % 2  P  DLVA Predicted % 104  P  TLC L 7.78  P  TLC % Predicted % 124  P  RV % Predicted % 77  P    P Preliminary result    FeNO:   Pathology:   Echocardiogram:   Heart Catheterization:     Assessment & Plan:     ICD-10-CM   1. Lung nodule  R91.1     2. Encounter for smoking cessation counseling  Z71.6     3. Human immunodeficiency virus (HIV) disease ([place])  B20     4. Tobacco use  Z72.0       Discussion:  This is a 62 year old [name] longstanding history of tobacco use, history of HIV.  Compliant with medications.  Enrolled in lung cancer screening found to have an abnormal lung cancer screening program with posterior right upper lobe nodule.Pulmonary function test complete with a reduced ratio, FEV1 of 34% predicted, TLC 124%, DLCO was unable to be complete.  Plan: Pet imaging reviewed today with no evidence of hypermetabolic uptake. Does not exclude low-grade malignancy. Will have a repeat noncontrasted CT scan of the chest in 6 months. Start Trelegy inhaler. Return to clinic in 6 months after repeat CT chest.   Current Outpatient Medications:    acetaminophen (TYLENOL) 500 MG tablet, Take 1 tablet (500 mg total) by mouth every 6 (six) hours as needed. (Patient taking differently: Take 1,000 mg by mouth every 6 (six) hours as needed for moderate pain (abdominal pain).), Disp: 30 tablet, Rfl: 0   amLODipine (NORVASC) 5 MG tablet, TAKE 1 TABLET(5 MG) BY MOUTH DAILY, Disp: 90 tablet, Rfl: 3    bictegravir-emtricitabine-tenofovir AF (BIKTARVY) 50-200-25 MG TABS tablet, Take 1 tablet by mouth daily., Disp: 30 tablet, Rfl: 11   Biotin 5000 MCG TABS, Take by mouth., Disp: , Rfl:    cetirizine (ZYRTEC) 10 MG tablet, Take 1 tablet (10 mg total) by mouth daily., Disp: 30 tablet, Rfl: 11   Cholecalciferol (VITAMIN D3) 1000 units CAPS, Take by mouth., Disp: , Rfl:    Ensure (ENSURE), Take 1 Can by mouth 3 (three) times daily between meals., Disp: 240 mL, Rfl: 11   estradiol (ESTRACE) 0.1 MG/GM vaginal cream, Place 0.5g nightly for two weeks then twice a week after, Disp: 30 g, Rfl: 11   fluticasone (FLONASE) 50 MCG/ACT nasal spray, Place 2 sprays into both nostrils daily., Disp: 16 g, Rfl: 6   ibuprofen (ADVIL) 600 MG tablet, Take 1 tablet (600 mg total) by mouth every 8 (eight) hours as  needed., Disp: 30 tablet, Rfl: 0   Multiple Vitamins-Minerals (MULTIVITAMIN GUMMIES ADULTS PO), Take by mouth., Disp: , Rfl:    Omega-3 Fatty Acids (FISH OIL) 1000 MG CAPS, Take by mouth., Disp: , Rfl:    rosuvastatin (CRESTOR) 5 MG tablet, Take 1 tablet (5 mg total) by mouth daily., Disp: 30 tablet, Rfl: 11   sodium chloride (OCEAN) 0.65 % SOLN nasal spray, Place 1 spray into both nostrils as needed for congestion., Disp: 480 mL, Rfl: 3   FLUoxetine (PROZAC) 20 MG capsule, Take 1 capsule (20 mg total) by mouth daily. (Patient not taking: Reported on 04/27/2022), Disp: 30 capsule, Rfl: 3   mirtazapine (REMERON) 7.5 MG tablet, Take 1 tablet (7.5 mg total) by mouth at bedtime. (Patient not taking: Reported on 04/27/2022), Disp: 90 each, Rfl: 3   solifenacin (VESICARE) 10 MG tablet, Take 1 tablet (10 mg total) by mouth daily. (Patient not taking: Reported on 04/27/2022), Disp: 30 tablet, Rfl: 5   Garner Nash, DO [facility] 04/30/2022 10:00 AM

## 2022-05-22 ENCOUNTER — Ambulatory Visit: Payer: Commercial Managed Care - HMO | Admitting: Infectious Diseases

## 2022-05-27 ENCOUNTER — Other Ambulatory Visit: Payer: Self-pay | Admitting: Internal Medicine

## 2022-05-27 DIAGNOSIS — B20 Human immunodeficiency virus [HIV] disease: Secondary | ICD-10-CM

## 2022-05-28 NOTE — Telephone Encounter (Signed)
Need for confirm pharmacy

## 2022-08-19 ENCOUNTER — Other Ambulatory Visit: Payer: Self-pay | Admitting: Obstetrics and Gynecology

## 2022-08-19 DIAGNOSIS — N3281 Overactive bladder: Secondary | ICD-10-CM

## 2022-08-20 ENCOUNTER — Ambulatory Visit: Payer: BC Managed Care – PPO | Admitting: Internal Medicine

## 2022-08-28 ENCOUNTER — Other Ambulatory Visit: Payer: Self-pay

## 2022-08-28 ENCOUNTER — Encounter: Payer: Self-pay | Admitting: Internal Medicine

## 2022-08-28 ENCOUNTER — Ambulatory Visit (INDEPENDENT_AMBULATORY_CARE_PROVIDER_SITE_OTHER): Payer: BC Managed Care – PPO | Admitting: Internal Medicine

## 2022-08-28 VITALS — BP 122/82 | HR 81 | Resp 16 | Ht 71.0 in | Wt 148.0 lb

## 2022-08-28 DIAGNOSIS — Z716 Tobacco abuse counseling: Secondary | ICD-10-CM

## 2022-08-28 DIAGNOSIS — B20 Human immunodeficiency virus [HIV] disease: Secondary | ICD-10-CM | POA: Diagnosis not present

## 2022-08-28 DIAGNOSIS — B182 Chronic viral hepatitis C: Secondary | ICD-10-CM

## 2022-08-28 DIAGNOSIS — F17210 Nicotine dependence, cigarettes, uncomplicated: Secondary | ICD-10-CM | POA: Diagnosis not present

## 2022-08-28 DIAGNOSIS — R9389 Abnormal findings on diagnostic imaging of other specified body structures: Secondary | ICD-10-CM

## 2022-08-28 MED ORDER — BIKTARVY 50-200-25 MG PO TABS: 1.0000 | ORAL_TABLET | Freq: Every day | ORAL | 11 refills | Status: DC

## 2022-08-28 MED ORDER — BUPROPION HCL ER (SR) 150 MG PO TB12: 150.0000 mg | ORAL_TABLET | Freq: Two times a day (BID) | ORAL | 11 refills | Status: DC

## 2022-08-28 MED ORDER — MIRTAZAPINE 7.5 MG PO TABS: 7.5000 mg | ORAL_TABLET | Freq: Every day | ORAL | 3 refills | Status: DC

## 2022-08-28 NOTE — Progress Notes (Signed)
Patient ID: [name], female   DOB: 09/25/1960, 62 y.o.   MRN: [id number]  HPI [name] is a 62yo F with well controlled hiv disease on biktarvy.  Excellent adherence. 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 Baseline weight 155. Up to to 148 (with shoes). She reports that she is doing better with increased appetite.  Outpatient Encounter Medications as of 08/28/2022  Medication Sig   acetaminophen (TYLENOL) 500 MG tablet Take 1 tablet (500 mg total) by mouth every 6 (six) hours as needed. (Patient taking differently: Take 1,000 mg by mouth every 6 (six) hours as needed for moderate pain (abdominal pain).)   amLODipine (NORVASC) 5 MG tablet TAKE 1 TABLET(5 MG) BY MOUTH DAILY   bictegravir-emtricitabine-tenofovir AF (BIKTARVY) 50-200-25 MG TABS tablet Take 1 tablet by mouth daily.   Biotin 5000 MCG TABS Take by mouth.   cetirizine (ZYRTEC) 10 MG tablet Take 1 tablet (10 mg total) by mouth daily.   Cholecalciferol (VITAMIN D3) 1000 units CAPS Take by mouth.   Ensure (ENSURE) Take 1 Can by mouth 3 (three) times daily between meals.   estradiol (ESTRACE) 0.1 MG/GM vaginal cream Place 0.5g nightly for two weeks then twice a week after   fluticasone (FLONASE) 50 MCG/ACT nasal spray Place 2 sprays into both nostrils daily.   ibuprofen (ADVIL) 600 MG tablet Take 1 tablet (600 mg total) by mouth every 8 (eight) hours as needed.   Multiple Vitamins-Minerals (MULTIVITAMIN GUMMIES ADULTS PO) Take by mouth.   Omega-3 Fatty Acids (FISH OIL) 1000 MG CAPS Take by mouth.   rosuvastatin (CRESTOR) 5 MG tablet Take 1 tablet (5 mg total) by mouth daily.   sodium chloride (OCEAN) 0.65 % SOLN nasal spray Place 1 spray into both nostrils as needed for congestion.   solifenacin (VESICARE) 10 MG tablet TAKE 1 TABLET(10 MG) BY MOUTH DAILY   FLUoxetine (PROZAC) 20 MG capsule Take 1 capsule (20 mg total) by mouth daily. (Patient not taking: Reported on  04/27/2022)   mirtazapine (REMERON) 7.5 MG tablet Take 1 tablet (7.5 mg total) by mouth at bedtime. (Patient not taking: Reported on 04/27/2022)   No facility-administered encounter medications on file as of 08/28/2022.     Patient Active Problem List   Diagnosis Date Noted   Screening for cervical cancer 04/16/2022   Weight loss 04/16/2022   Nonintractable headache 08/10/2021   Cystocele with prolapse 07/21/2020   Callus of foot 12/12/2016   Multiple renal cysts 08/14/2016   Constipation 11/30/2014   Essential hypertension 11/30/2014   Hot flashes, menopausal 01/27/2014   Asthma 01/23/2011   HEADACHE 04/14/2008   CHOLELITHIASIS 12/15/2007   PERIPHERAL NEUROPATHY 11/07/2007   Avulsion fracture of tooth 07/23/2007   Depression 07/24/2006   Human immunodeficiency virus (HIV) disease (HCC) 12/14/2005   MACROCYTIC ANEMIA 12/14/2005   TOBACCO ABUSE 12/14/2005   SUBSTANCE ABUSE 12/14/2005   GERD 12/14/2005   PNEUMONIA, HX OF 12/14/2005   COLPOSCOPY, HX OF 12/14/2005     Health Maintenance Due  Topic Date Due   Zoster Vaccines- Shingrix (1 of 2) Never done   COVID-19 Vaccine (5 - 2023-24 season) 04/12/2022     Review of Systems Review of Systems  Constitutional: Negative for fever, chills, diaphoresis, activity change, appetite change, fatigue and unexpected weight change.  HENT: Negative for congestion, sore throat, rhinorrhea, sneezing, trouble swallowing and sinus pressure.  Eyes: Negative for photophobia and visual disturbance.  Respiratory: Negative for cough,  chest tightness, shortness of breath, wheezing and stridor.  Cardiovascular: Negative for chest pain, palpitations and leg swelling.  Gastrointestinal: Negative for nausea, vomiting, abdominal pain, diarrhea, constipation, blood in stool, abdominal distention and anal bleeding.  Genitourinary: Negative for dysuria, hematuria, flank pain and difficulty urinating.  Musculoskeletal: Negative for myalgias, back pain,  joint swelling, arthralgias and gait problem.  Skin: Negative for color change, pallor, rash and wound.  Neurological: Negative for dizziness, tremors, weakness and light-headedness.  Hematological: Negative for adenopathy. Does not bruise/bleed easily.  Psychiatric/Behavioral: Negative for behavioral problems, confusion, sleep disturbance, dysphoric mood, decreased concentration and agitation.   Physical Exam   BP 122/82   Pulse 81   Resp 16   Ht 5\' 11"  (1.803 m)   Wt 148 lb (67.1 kg)   LMP 03/27/2011   BMI 20.64 kg/m   Physical Exam  Constitutional:  oriented to person, place, and time. appears well-developed and well-nourished. No distress.  HENT: [place]/AT, PERRLA, no scleral icterus Mouth/Throat: Oropharynx is clear and moist. No oropharyngeal exudate.  Cardiovascular: Normal rate, regular rhythm and normal heart sounds. Exam reveals no gallop and no friction rub.  No murmur heard.  Pulmonary/Chest: Effort normal and breath sounds normal. No respiratory distress.  has no wheezes.  Neck = supple, no nuchal rigidity Abdominal: Soft. Bowel sounds are normal.  exhibits no distension. There is no tenderness.  Lymphadenopathy: no cervical adenopathy. No axillary adenopathy Neurological: alert and oriented to person, place, and time.  Skin: Skin is warm and dry. No rash noted. No erythema.  Psychiatric: a normal mood and affect.  behavior is normal.   Lab Results  Component Value Date   CD4TCELL 30 (L) 02/15/2022   Lab Results  Component Value Date   CD4TABS 356 (L) 02/15/2022   CD4TABS 413 11/02/2021   CD4TABS 444 07/07/2020   Lab Results  Component Value Date   HIV1RNAQUANT Not Detected 02/15/2022   Lab Results  Component Value Date   HEPBSAB YES 04/08/2006   Lab Results  Component Value Date   LABRPR REACTIVE (A) 02/15/2022    CBC Lab Results  Component Value Date   WBC 4.6 02/15/2022   RBC 4.38 02/15/2022   HGB 13.6 02/15/2022   HCT 40.9 02/15/2022   PLT 219  02/15/2022   MCV 93.4 02/15/2022   MCH 31.1 02/15/2022   MCHC 33.3 02/15/2022   RDW 11.6 02/15/2022   LYMPHSABS 1,302 02/15/2022   MONOABS 0.7 07/20/2018   EOSABS 110 02/15/2022    BMET Lab Results  Component Value Date   NA 143 02/15/2022   K 4.3 02/15/2022   CL 108 02/15/2022   CO2 27 02/15/2022   GLUCOSE 104 (H) 02/15/2022   BUN 10 02/15/2022   CREATININE 0.99 02/15/2022   CALCIUM 9.7 02/15/2022   GFRNONAA 64 07/20/2019   GFRAA 74 07/20/2019      Assessment and Plan HIV disease= will plan to get Labs, refill on biktarvy  History of abnormal chest Ct. Subpleural thickening= Needs repeat chest CT in mid late sept  Smoking cessation =will do a trial of bupropion  Hx of treated hep C = Needs RUQ U/S for [facility] surveillance for liver cancer  Weight loss = plan to continue to use remoeron to see if that could help increase appetite.

## 2022-08-29 LAB — T-HELPER CELL (CD4) - (RCID CLINIC ONLY)
CD4 % Helper T Cell: 31 % — ABNORMAL LOW (ref 33–65)
CD4 T Cell Abs: 415 /uL (ref 400–1790)

## 2022-08-30 LAB — CBC WITH DIFFERENTIAL/PLATELET
Absolute Monocytes: 405 cells/uL (ref 200–950)
Basophils Absolute: 9 cells/uL (ref 0–200)
Basophils Relative: 0.2 %
Eosinophils Absolute: 101 cells/uL (ref 15–500)
Eosinophils Relative: 2.3 %
HCT: 36.6 % (ref 35.0–45.0)
Hemoglobin: 11.9 g/dL (ref 11.7–15.5)
Lymphs Abs: 1531 cells/uL (ref 850–3900)
MCH: 29.8 pg (ref 27.0–33.0)
MCHC: 32.5 g/dL (ref 32.0–36.0)
MCV: 91.5 fL (ref 80.0–100.0)
MPV: 10.8 fL (ref 7.5–12.5)
Monocytes Relative: 9.2 %
Neutro Abs: 2354 cells/uL (ref 1500–7800)
Neutrophils Relative %: 53.5 %
Platelets: 200 10*3/uL (ref 140–400)
RBC: 4 10*6/uL (ref 3.80–5.10)
RDW: 12.1 % (ref 11.0–15.0)
Total Lymphocyte: 34.8 %
WBC: 4.4 10*3/uL (ref 3.8–10.8)

## 2022-08-30 LAB — COMPLETE METABOLIC PANEL WITH GFR
AG Ratio: 1.6 (calc) (ref 1.0–2.5)
ALT: 12 U/L (ref 6–29)
AST: 19 U/L (ref 10–35)
Albumin: 4.5 g/dL (ref 3.6–5.1)
Alkaline phosphatase (APISO): 118 U/L (ref 37–153)
BUN/Creatinine Ratio: 14 (calc) (ref 6–22)
BUN: 15 mg/dL (ref 7–25)
CO2: 28 mmol/L (ref 20–32)
Calcium: 9.2 mg/dL (ref 8.6–10.4)
Chloride: 106 mmol/L (ref 98–110)
Creat: 1.08 mg/dL — ABNORMAL HIGH (ref 0.50–1.05)
Globulin: 2.8 g/dL (calc) (ref 1.9–3.7)
Glucose, Bld: 85 mg/dL (ref 65–99)
Potassium: 4.1 mmol/L (ref 3.5–5.3)
Sodium: 140 mmol/L (ref 135–146)
Total Bilirubin: 0.4 mg/dL (ref 0.2–1.2)
Total Protein: 7.3 g/dL (ref 6.1–8.1)
eGFR: 58 mL/min/{1.73_m2} — ABNORMAL LOW (ref >=60)

## 2022-08-30 LAB — HIV-1 RNA QUANT-NO REFLEX-BLD
HIV 1 RNA Quant: NOT DETECTED Copies/mL
HIV-1 RNA Quant, Log: NOT DETECTED Log cps/mL

## 2022-08-30 LAB — LIPID PANEL
Cholesterol: 133 mg/dL (ref <200)
HDL: 64 mg/dL (ref >=50)
LDL Cholesterol (Calc): 51 mg/dL (calc)
Non-HDL Cholesterol (Calc): 69 mg/dL (calc) (ref <130)
Total CHOL/HDL Ratio: 2.1 (calc) (ref <5.0)
Triglycerides: 96 mg/dL (ref <150)

## 2022-08-30 LAB — T PALLIDUM AB: T Pallidum Abs: NEGATIVE

## 2022-08-30 LAB — RPR TITER: RPR Titer: 1:2 {titer} — ABNORMAL HIGH

## 2022-08-30 LAB — RPR: RPR Ser Ql: REACTIVE — AB

## 2022-10-22 ENCOUNTER — Other Ambulatory Visit: Payer: Self-pay | Admitting: Nurse Practitioner

## 2022-10-22 DIAGNOSIS — J0110 Acute frontal sinusitis, unspecified: Secondary | ICD-10-CM

## 2022-10-25 ENCOUNTER — Ambulatory Visit: Payer: BC Managed Care – PPO | Admitting: Obstetrics and Gynecology

## 2022-10-25 ENCOUNTER — Ambulatory Visit (INDEPENDENT_AMBULATORY_CARE_PROVIDER_SITE_OTHER): Payer: BC Managed Care – PPO | Admitting: Obstetrics and Gynecology

## 2022-10-25 ENCOUNTER — Encounter: Payer: Self-pay | Admitting: Obstetrics and Gynecology

## 2022-10-25 VITALS — BP 134/81 | HR 80

## 2022-10-25 DIAGNOSIS — N952 Postmenopausal atrophic vaginitis: Secondary | ICD-10-CM | POA: Diagnosis not present

## 2022-10-25 DIAGNOSIS — N3281 Overactive bladder: Secondary | ICD-10-CM | POA: Diagnosis not present

## 2022-10-25 DIAGNOSIS — R350 Frequency of micturition: Secondary | ICD-10-CM

## 2022-10-25 MED ORDER — ESTROGENS CONJUGATED 0.625 MG/GM VA CREA: 1.0000 | TOPICAL_CREAM | VAGINAL | 5 refills | Status: DC

## 2022-10-25 MED ORDER — MIRABEGRON ER 25 MG PO TB24: 25.0000 mg | ORAL_TABLET | Freq: Every day | ORAL | 5 refills | Status: AC

## 2022-10-25 NOTE — Progress Notes (Signed)
[facility] Return Visit  SUBJECTIVE  History of Present Illness: Jeraldin YOLANI SUDLER is a 62 y.o. female seen in follow-up for OAB. Plan at last visit was continue Vesicare 10mg  daily.   She reports she has been having some dry mouth and constipation that are uncomfortable and has been going >8 time a day.   Also reports the estrace cream was not affordable.     Past Medical History: Patient  has a past medical history of Anxiety (11/2018), Asthma, Hepatitis C, HIV (human immunodeficiency virus infection) (H[facility]), Hypertension, STD (sexually transmitted disease), Substance abuse ([facility]), and Underweight (07/29/2014).   Past Surgical History: She  has a past surgical history that includes Colonoscopy with propofol (N/A, 06/26/2016); laparoscopic appendectomy (N/A, 07/20/2018); Pancreatectomy; Bunionectomy (Left, 03/15/2020); and fractured wrist (Left).   Medications: She has a current medication list which includes the following prescription(s): acetaminophen, amlodipine, biktarvy, biotin, bupropion, cetirizine, vitamin d3, conjugated estrogens, ensure, fluticasone, ibuprofen, mirabegron er, mirtazapine, multiple vitamins-minerals, fish oil, rosuvastatin, and sodium chloride.   Allergies: Patient is allergic to aspirin.   Social History: Patient  reports that she has been smoking cigarettes. She has a 16.5 pack-year smoking history. She has never been exposed to tobacco smoke. She has never used smokeless tobacco. She reports current alcohol use of about 5.0 standard drinks of alcohol per week. She reports that she does not use drugs.      OBJECTIVE     Physical Exam: Vitals:   10/25/22 1538  BP: 134/81  Pulse: 80   Gen: No apparent distress, A&O x 3.  Detailed Urogynecologic Evaluation:  Deferred.    ASSESSMENT AND PLAN    Ms. Kipper is a 62 y.o. with:  1. Overactive bladder   2. Urinary frequency   3. Vaginal atrophy    Will change patient from  Vesicare to Myrbetriq 25mg  daily. Will see if this decreases the side effects of dry eyes, dry mouth and constipation while better controlling her OAB symptoms.  New prescription for premarin sent in to pharmacy as this is her insurance's preferred estrogen cream.   Patient to follow up in 2 months or sooner if needed.

## 2022-10-25 NOTE — Patient Instructions (Signed)
Start using estrogen cream nightly for 2 weeks and then twice a week after.   Please stop the Vesicare and start the Myrbetriq for your overactive bladder symptoms.

## 2022-10-29 ENCOUNTER — Ambulatory Visit: Payer: Self-pay | Admitting: Nurse Practitioner

## 2022-10-30 ENCOUNTER — Other Ambulatory Visit: Payer: BC Managed Care – PPO

## 2022-10-31 ENCOUNTER — Ambulatory Visit (HOSPITAL_COMMUNITY): Payer: BC Managed Care – PPO

## 2022-10-31 ENCOUNTER — Other Ambulatory Visit: Payer: BC Managed Care – PPO

## 2022-11-01 ENCOUNTER — Other Ambulatory Visit: Payer: Self-pay | Admitting: Pulmonary Disease

## 2022-11-02 ENCOUNTER — Ambulatory Visit: Payer: BC Managed Care – PPO | Admitting: Nurse Practitioner

## 2022-11-02 ENCOUNTER — Encounter: Payer: Self-pay | Admitting: Nurse Practitioner

## 2022-11-02 VITALS — BP 123/76 | HR 80 | Temp 97.5°F | Resp 18 | Ht 71.0 in | Wt 146.6 lb

## 2022-11-02 DIAGNOSIS — I10 Essential (primary) hypertension: Secondary | ICD-10-CM

## 2022-11-02 DIAGNOSIS — Z23 Encounter for immunization: Secondary | ICD-10-CM | POA: Diagnosis not present

## 2022-11-02 NOTE — Patient Instructions (Signed)
1. Need for influenza vaccination  - Flu vaccine trivalent PF, 6mos and older(Flulaval,Afluria,Fluarix,Fluzone)   Follow up:  Follow up in 6 months

## 2022-11-06 ENCOUNTER — Ambulatory Visit
Admission: RE | Admit: 2022-11-06 | Discharge: 2022-11-06 | Disposition: A | Discharge status: Home / Self Care | Payer: BC Managed Care – PPO | Source: Ambulatory Visit | Attending: Pulmonary Disease | Admitting: Pulmonary Disease

## 2022-11-06 ENCOUNTER — Ambulatory Visit: Payer: BC Managed Care – PPO | Admitting: Acute Care

## 2022-11-06 DIAGNOSIS — R911 Solitary pulmonary nodule: Secondary | ICD-10-CM

## 2022-11-07 ENCOUNTER — Ambulatory Visit: Payer: BC Managed Care – PPO | Admitting: Acute Care

## 2022-11-08 ENCOUNTER — Emergency Department (HOSPITAL_COMMUNITY): Payer: BC Managed Care – PPO

## 2022-11-08 ENCOUNTER — Encounter (HOSPITAL_COMMUNITY): Payer: Self-pay | Admitting: Emergency Medicine

## 2022-11-08 ENCOUNTER — Other Ambulatory Visit: Payer: Self-pay

## 2022-11-08 ENCOUNTER — Observation Stay (HOSPITAL_COMMUNITY)
Admission: EM | Admit: 2022-11-08 | Discharge: 2022-11-09 | Disposition: A | Discharge status: Home / Self Care | Payer: BC Managed Care – PPO | Attending: Internal Medicine | Admitting: Internal Medicine

## 2022-11-08 DIAGNOSIS — F17210 Nicotine dependence, cigarettes, uncomplicated: Secondary | ICD-10-CM | POA: Insufficient documentation

## 2022-11-08 DIAGNOSIS — R0789 Other chest pain: Secondary | ICD-10-CM | POA: Diagnosis not present

## 2022-11-08 DIAGNOSIS — Z21 Asymptomatic human immunodeficiency virus [HIV] infection status: Secondary | ICD-10-CM | POA: Diagnosis not present

## 2022-11-08 DIAGNOSIS — I2510 Atherosclerotic heart disease of native coronary artery without angina pectoris: Secondary | ICD-10-CM | POA: Diagnosis not present

## 2022-11-08 DIAGNOSIS — J45909 Unspecified asthma, uncomplicated: Secondary | ICD-10-CM | POA: Insufficient documentation

## 2022-11-08 DIAGNOSIS — Z79899 Other long term (current) drug therapy: Secondary | ICD-10-CM | POA: Insufficient documentation

## 2022-11-08 DIAGNOSIS — R9431 Abnormal electrocardiogram [ECG] [EKG]: Secondary | ICD-10-CM

## 2022-11-08 DIAGNOSIS — R079 Chest pain, unspecified: Secondary | ICD-10-CM | POA: Diagnosis not present

## 2022-11-08 DIAGNOSIS — Z720 Tobacco use: Secondary | ICD-10-CM | POA: Diagnosis not present

## 2022-11-08 DIAGNOSIS — B20 Human immunodeficiency virus [HIV] disease: Secondary | ICD-10-CM | POA: Insufficient documentation

## 2022-11-08 DIAGNOSIS — I10 Essential (primary) hypertension: Secondary | ICD-10-CM | POA: Diagnosis not present

## 2022-11-08 DIAGNOSIS — J449 Chronic obstructive pulmonary disease, unspecified: Secondary | ICD-10-CM | POA: Insufficient documentation

## 2022-11-08 LAB — BASIC METABOLIC PANEL
Anion gap: 10 (ref 5–15)
BUN: 22 mg/dL (ref 8–23)
CO2: 26 mmol/L (ref 22–32)
Calcium: 9.5 mg/dL (ref 8.9–10.3)
Chloride: 104 mmol/L (ref 98–111)
Creatinine, Ser: 1.29 mg/dL — ABNORMAL HIGH (ref 0.44–1.00)
GFR, Estimated: 47 mL/min — ABNORMAL LOW (ref >60)
Glucose, Bld: 96 mg/dL (ref 70–99)
Potassium: 4.2 mmol/L (ref 3.5–5.1)
Sodium: 140 mmol/L (ref 135–145)

## 2022-11-08 LAB — LIPID PANEL
Cholesterol: 135 mg/dL (ref 0–200)
HDL: 67 mg/dL (ref >40)
LDL Cholesterol: 47 mg/dL (ref 0–99)
Total CHOL/HDL Ratio: 2 RATIO
Triglycerides: 105 mg/dL (ref <150)
VLDL: 21 mg/dL (ref 0–40)

## 2022-11-08 LAB — TROPONIN I (HIGH SENSITIVITY)
Troponin I (High Sensitivity): 2 ng/L (ref <18)
Troponin I (High Sensitivity): 3 ng/L (ref <18)

## 2022-11-08 LAB — CBC
HCT: 38.9 % (ref 36.0–46.0)
Hemoglobin: 12.1 g/dL (ref 12.0–15.0)
MCH: 29.9 pg (ref 26.0–34.0)
MCHC: 31.1 g/dL (ref 30.0–36.0)
MCV: 96 fL (ref 80.0–100.0)
Platelets: 195 10*3/uL (ref 150–400)
RBC: 4.05 MIL/uL (ref 3.87–5.11)
RDW: 12.5 % (ref 11.5–15.5)
WBC: 4.1 10*3/uL (ref 4.0–10.5)
nRBC: 0 % (ref 0.0–0.2)

## 2022-11-08 LAB — APTT: aPTT: 28 seconds (ref 24–36)

## 2022-11-08 LAB — PROTIME-INR
INR: 0.9 (ref 0.8–1.2)
Prothrombin Time: 12.6 seconds (ref 11.4–15.2)

## 2022-11-08 LAB — HEMOGLOBIN A1C
Hgb A1c MFr Bld: 4.6 % — ABNORMAL LOW (ref 4.8–5.6)
Mean Plasma Glucose: 85.32 mg/dL

## 2022-11-08 LAB — RAPID URINE DRUG SCREEN, HOSP PERFORMED
Amphetamines: NOT DETECTED
Barbiturates: NOT DETECTED
Benzodiazepines: NOT DETECTED
Cocaine: NOT DETECTED
Opiates: NOT DETECTED
Tetrahydrocannabinol: POSITIVE — AB

## 2022-11-08 LAB — I-STAT CG4 LACTIC ACID, ED: Lactic Acid, Venous: 0.7 mmol/L (ref 0.5–1.9)

## 2022-11-08 MED ORDER — ONDANSETRON HCL 4 MG/2ML IJ SOLN: 4.0000 mg | Freq: Four times a day (QID) | INTRAMUSCULAR | Status: DC | PRN

## 2022-11-08 MED ORDER — SODIUM CHLORIDE 0.9 % IV SOLN: INTRAVENOUS | Status: DC

## 2022-11-08 MED ORDER — POLYETHYLENE GLYCOL 3350 17 G PO PACK: 17.0000 g | PACK | Freq: Every day | ORAL | Status: DC | PRN

## 2022-11-08 MED ORDER — HEPARIN SODIUM (PORCINE) 5000 UNIT/ML IJ SOLN
60.0000 [IU]/kg | Freq: Once | INTRAMUSCULAR | Status: AC
  Administered 2022-11-08: 4000 [IU] via INTRAVENOUS
  Filled 2022-11-08: qty 1

## 2022-11-08 MED ORDER — ACETAMINOPHEN 325 MG PO TABS: 650.0000 mg | ORAL_TABLET | Freq: Four times a day (QID) | ORAL | Status: DC | PRN

## 2022-11-08 MED ORDER — ACETAMINOPHEN 650 MG RE SUPP: 650.0000 mg | Freq: Four times a day (QID) | RECTAL | Status: DC | PRN

## 2022-11-08 MED ORDER — METOPROLOL TARTRATE 5 MG/5ML IV SOLN: 5.0000 mg | Freq: Four times a day (QID) | INTRAVENOUS | Status: DC | PRN

## 2022-11-08 MED ORDER — ONDANSETRON HCL 4 MG PO TABS: 4.0000 mg | ORAL_TABLET | Freq: Four times a day (QID) | ORAL | Status: DC | PRN

## 2022-11-08 NOTE — Plan of Care (Signed)

## 2022-11-08 NOTE — Hospital Course (Addendum)
62 year old female with history of hypertension on amlodipine, HLD on Crestor, HIV on Biktarvy-last CD4 415 on 08/28/22, hepatitis C, tobacco abuse presented with service renal chest pain initially started like a heartburn, and present on and off x 3 days, pain did not improve with Tums and persisted 9/26 so came to ED. Pain is described as substernal chest pain, sharp with associated headache/shortness of breath, with pain radiating to her neck Patient otherwise denies any nausea, vomiting, fever, chills,focal weakness, numbness tingling, speech difficulties.   In the ED: Afebrile with stable vital signs heart rate 84 saturating 98% room air.  Lab work essentially with normal CBC, BMP with creatinine elevated 1.29 troponin 2, lactic acid 0.7 INR glucose normal. Chest x-ray no acute finding.  Stable nodular density right upper lobe since 2015. EKG> first EKG w/ STE in III and aVR,  2nd EKG ST depression on ii, iii avf Patient was started on heparin drip, cardiology consulted> advised admission to [facility] for possible cardiac catheterization in am.

## 2022-11-08 NOTE — Consult Note (Signed)
Cardiology Consultation   Patient ID: [name] MRN: [id number]; DOB: 1960/07/30  Admit date: 11/08/2022 Date of Consult: 11/08/2022  PCP:  Ivonne Andrew, NP   [facility] Providers Cardiologist:  None        Patient Profile:   [name] is a 62 y.o. female with a hx of HIV, hypertension, HCV, tobacco use who is being seen 11/08/2022 for the evaluation of chest pain at the request of Dr. Rosalia Hammers.  History of Present Illness:   [name] is a 62 year old female with the above medical history who were consulted for evaluation of chest pain.  She reports chest pain for the last 3 days.  Described as sharp pain in center chest, can last up to 2 minutes when it occurs.  Has noticed pain when she is walked up stairs but also when she is under stress.  Also reports feeling short of breath with exertion, frequent with stairs.  On presentation to ED, nasal vital signs notable for BP 128/82, pulse 84, SpO2 99% on room air.  Labs notable for creatinine 1.29, potassium 4.2, hemoglobin 12.1, troponin 2>3.  Initial EKG showed normal sinus rhythm, rate 82, nonspecific T wave flattening.  Repeat EKG showed new 1 mm ST depressions and T wave inversion in leads III, aVF.   Past Medical History:  Diagnosis Date   Anxiety 11/2018   Asthma    Hepatitis C    Has been treated   HIV (human immunodeficiency virus infection) (HCC)    Hypertension    STD (sexually transmitted disease)    Substance abuse (HCC)    Underweight 07/29/2014    Past Surgical History:  Procedure Laterality Date   BUNIONECTOMY Left 03/15/2020   COLONOSCOPY WITH PROPOFOL N/A 06/26/2016   Procedure: COLONOSCOPY WITH PROPOFOL;  Surgeon: Kathi Der, MD;  Location: [facility];  Service: Gastroenterology;  Laterality: N/A;   fractured wrist Left    LAPAROSCOPIC APPENDECTOMY N/A 07/20/2018   Procedure: APPENDECTOMY LAPAROSCOPIC;  Surgeon: Berna Bue, MD;  Location: [facility] OR;  Service:  General;  Laterality: N/A;   PANCREATECTOMY       Inpatient Medications: Scheduled Meds:  Continuous Infusions:  sodium chloride 20 mL/hr at 11/08/22 1556   PRN Meds:   Allergies:    Allergies  Allergen Reactions   Aspirin Other (See Comments)    heart murmur    Social History:   Social History   Socioeconomic History   Marital status: Single    Spouse name: Not on file   Number of children: Not on file   Years of education: Not on file   Highest education level: Not on file  Occupational History   Not on file  Tobacco Use   Smoking status: Every Day    Current packs/day: 0.50    Average packs/day: 0.5 packs/day for 33.0 years (16.5 ttl pk-yrs)    Types: Cigarettes    Passive exposure: Never   Smokeless tobacco: Never   Tobacco comments:    Smokes 4 packs a cigarettes a week. 04/30/2022 Tay  Vaping Use   Vaping status: Never Used  Substance and Sexual Activity   Alcohol use: Yes    Alcohol/week: 5.0 standard drinks of alcohol    Types: 5 Cans of beer per week   Drug use: No    Comment: clean for 10 years   Sexual activity: Not Currently    Birth control/protection: Post-menopausal    Comment: pt. given condoms  Other Topics Concern  Not on file  Social History Narrative   Not on file   Social Determinants of Health   Financial Resource Strain: Not on file  Food Insecurity: Food Insecurity Present (05/20/2019)   Hunger Vital Sign    Worried About Running Out of Food in the Last Year: Never true    Ran Out of Food in the Last Year: Sometimes true  Transportation Needs: No Transportation Needs (05/20/2019)   PRAPARE - Administrator, Civil Service (Medical): No    Lack of Transportation (Non-Medical): No  Physical Activity: Not on file  Stress: Not on file  Social Connections: Not on file  Intimate Partner Violence: Not on file    Family History:    Family History  Problem Relation Age of Onset   Cancer Mother        bone   Diabetes  Sister    Colon cancer Maternal Uncle    Esophageal cancer Neg Hx    Gastric cancer Neg Hx    Pancreatic cancer Neg Hx    Liver disease Neg Hx    Inflammatory bowel disease Neg Hx      ROS:  Please see the history of present illness.   All other ROS reviewed and negative.     Physical Exam/Data:   Vitals:   11/08/22 1409  BP: 128/82  Pulse: 84  Resp: 17  Temp: 98.3 F (36.8 C)  TempSrc: Oral  SpO2: 99%   No intake or output data in the 24 hours ending 11/08/22 1609    11/02/2022    3:49 PM 08/28/2022    3:50 PM 04/30/2022    9:54 AM  Last 3 Weights  Weight (lbs) 146 lb 9.6 oz 148 lb 146 lb  Weight (kg) 66.497 kg 67.132 kg 66.225 kg     There is no height or weight on file to calculate BMI.  General:   in no acute distress HEENT: normal Neck: no JVD Cardiac:  normal S1, S2; RRR; no murmur  Lungs:  clear to auscultation bilaterally, no wheezing, rhonchi or rales  Abd: soft, nontender, no hepatomegaly  Ext: no edema Musculoskeletal:  No deformities, BUE and BLE strength normal and equal Skin: warm and dry  Neuro:  CNs 2-12 intact, no focal abnormalities noted Psych:  Normal affect   EKG:  The EKG was personally reviewed and demonstrates:  Initial EKG showed normal sinus rhythm, rate 82, nonspecific T wave flattening.  Repeat EKG showed new 1 mm ST depressions and T wave inversion in leads III, aVF. Telemetry:  Telemetry was personally reviewed and demonstrates:  NSR  Relevant CV Studies:   Laboratory Data:  High Sensitivity Troponin:   Recent Labs  Lab 11/08/22 1441  TROPONINIHS 2     Chemistry Recent Labs  Lab 11/08/22 1441  NA 140  K 4.2  CL 104  CO2 26  GLUCOSE 96  BUN 22  CREATININE 1.29*  CALCIUM 9.5  GFRNONAA 47*  ANIONGAP 10    No results for input(s): "PROT", "ALBUMIN", "AST", "ALT", "ALKPHOS", "BILITOT" in the last 168 hours. Lipids No results for input(s): "CHOL", "TRIG", "HDL", "LABVLDL", "LDLCALC", "CHOLHDL" in the last 168 hours.   Hematology Recent Labs  Lab 11/08/22 1441  WBC 4.1  RBC 4.05  HGB 12.1  HCT 38.9  MCV 96.0  MCH 29.9  MCHC 31.1  RDW 12.5  PLT 195   Thyroid No results for input(s): "TSH", "FREET4" in the last 168 hours.  BNPNo results for input(s): "  BNP", "PROBNP" in the last 168 hours.  DDimer No results for input(s): "DDIMER" in the last 168 hours.   Radiology/Studies:  No results found.   Assessment and Plan:   Chest pain: Reports atypical chest pain, however does report can occur with stress or exertion, suggesting possible angina.  In addition, she had dynamic EKG changes while in the ED, with development of 1 mm ST depression/T wave inversions in inferior leads.  She does have multiple CAD risk factors (tobacco use, HTN, HLD), though recent CT chest does not show coronary calcifications.  Troponins negative, but suspect may not bump troponins given short duration of chest pain -Plan coronary CTA for further evaluation -Echocardiogram  Hypertension: Continue amlodipine 5 mg daily  Hyperlipidemia: Continue rosuvastatin 5 mg daily.  LDL 47  Tobacco use: needs to quit.    For questions or updates, please contact [facility] Please consult [email or web address] for contact info under    Signed, Little Ishikawa, MD  11/08/2022 4:09 PM

## 2022-11-08 NOTE — ED Triage Notes (Addendum)
Patient presents with chest pain. Originally she thought she had heart burn yesterday, but Tums did not help. The pain persisted into today. She complains of a sharp pain in her chest as well as headaches and shortness of breath.

## 2022-11-08 NOTE — ED Provider Notes (Addendum)
[facility] AT [facility] Provider Note   CSN: [id number] Arrival date & time: 11/08/22  1400     History  Chief Complaint  Patient presents with   Chest Pain   Shortness of Breath    [name] is a 62 y.o. female.  HPI 62 year old female history of hypertension, hypercholesterolemia, HIV, hep C, smoker, presents today complaining of substernal chest pain that has been coming and going.  She reports some radiation to her neck.  She reports that has been present off and on for 3 days.  She states it gets sharp and worsens over several minutes but then resolves.  She is unable to related specifically to exertion, but has some associated dyspnea.  Unclear is whether she has any prior cardiac evaluation  Home Medications Prior to Admission medications   Medication Sig Start Date End Date Taking? Authorizing Provider  acetaminophen (TYLENOL) 500 MG tablet Take 1 tablet (500 mg total) by mouth every 6 (six) hours as needed. Patient taking differently: Take 1,000 mg by mouth every 6 (six) hours as needed for mild pain, moderate pain or headache (or abdominal pain). 02/20/18  Yes Bast, Traci A, NP  amLODipine (NORVASC) 5 MG tablet TAKE 1 TABLET(5 MG) BY MOUTH DAILY Patient taking differently: Take 5 mg by mouth in the morning. 04/27/22  Yes Ivonne Andrew, NP  bictegravir-emtricitabine-tenofovir AF (BIKTARVY) 50-200-25 MG TABS tablet Take 1 tablet by mouth daily. 08/28/22  Yes Judyann Munson, MD  Biotin 5000 MCG TABS Take 5,000 mcg by mouth daily.   Yes [provider]  buPROPion (WELLBUTRIN SR) 150 MG 12 hr tablet Take 1 tablet (150 mg total) by mouth 2 (two) times daily. Start with once a day for 3 days then increase to twice a day Patient taking differently: Take 150 mg by mouth in the morning. 08/28/22  Yes Judyann Munson, MD  cetirizine (ZYRTEC) 10 MG tablet Take 10 mg by mouth daily as needed for allergies or rhinitis.   Yes [provider]  Cholecalciferol (VITAMIN D3) 1000 units CAPS Take 1,000 Units by mouth daily.   Yes [provider]  conjugated estrogens (PREMARIN) vaginal cream Place 1 Applicatorful vaginally 2 (two) times a week. Place 0.5g nightly for two weeks then twice a week after Patient taking differently: Place 0.5 g vaginally See admin instructions. Beginning on 10/26/2022, place 0.5 g vaginally at bedtime for two weeks, then twice a week thereafter 10/25/22  Yes Zuleta, Joan Mayans, NP  fluticasone (FLONASE) 50 MCG/ACT nasal spray SHAKE LIQUID AND USE 2 SPRAYS IN EACH NOSTRIL DAILY Patient taking differently: Place 2 sprays into both nostrils daily as needed for allergies or rhinitis. 10/22/22  Yes Ivonne Andrew, NP  Fluticasone-Umeclidin-Vilant (TRELEGY ELLIPTA) 100-62.5-25 MCG/ACT AEPB INHALE 1 PUFF INTO THE LUNGS EVERY DAY Patient taking differently: Inhale 1 puff into the lungs daily. 11/01/22  Yes Icard, Rachel Bo, DO  mirabegron ER (MYRBETRIQ) 25 MG TB24 tablet Take 1 tablet (25 mg total) by mouth daily. 10/25/22  Yes Selmer Dominion, NP  mirtazapine (REMERON) 7.5 MG tablet Take 1 tablet (7.5 mg total) by mouth at bedtime. 08/28/22  Yes Judyann Munson, MD  Multiple Vitamins-Minerals (MULTIVITAMIN GUMMIES ADULTS PO) Take 1 tablet by mouth daily with breakfast.   Yes [provider]  Omega-3 Fatty Acids (FISH OIL) 1000 MG CAPS Take 1,000 mg by mouth daily.   Yes [provider]  rosuvastatin (CRESTOR) 5 MG tablet Take 1 tablet (5  mg total) by mouth daily. 04/27/22  Yes Ivonne Andrew, NP  sodium chloride (OCEAN) 0.65 % SOLN nasal spray Place 1 spray into both nostrils as needed for congestion. 10/22/19  Yes Ginnie Smart, MD  solifenacin (VESICARE) 10 MG tablet Take 10 mg by mouth daily.   Yes [provider]  TUMS E-X 750 750 MG chewable tablet Chew 1 tablet by mouth 2 (two) times daily as needed for heartburn.   Yes [provider]      Allergies     Aspirin    Review of Systems   Review of Systems  Physical Exam Updated Vital Signs BP 136/82 (BP Location: Right Arm)   Pulse 60   Temp (!) 97.5 F (36.4 C) (Oral)   Resp 15   Wt 63.7 kg   LMP 03/27/2011   SpO2 98%   BMI 19.60 kg/m  Physical Exam Vitals reviewed.  HENT:     Head: Normocephalic.  Eyes:     Pupils: Pupils are equal, round, and reactive to light.  Cardiovascular:     Rate and Rhythm: Normal rate and regular rhythm.     Heart sounds: Normal heart sounds.  Pulmonary:     Effort: Pulmonary effort is normal.     Breath sounds: Normal breath sounds.  Abdominal:     Palpations: Abdomen is soft.  Musculoskeletal:        General: Normal range of motion.     Cervical back: Normal range of motion.  Skin:    General: Skin is warm and dry.     Capillary Refill: Capillary refill takes less than 2 seconds.  Neurological:     General: No focal deficit present.     Mental Status: She is alert.     ED Results / Procedures / Treatments   Labs (all labs ordered are listed, but only abnormal results are displayed) Labs Reviewed  BASIC METABOLIC PANEL - Abnormal; Notable for the following components:      Result Value   Creatinine, Ser 1.29 (*)    GFR, Estimated 47 (*)    All other components within normal limits  HEMOGLOBIN A1C - Abnormal; Notable for the following components:   Hgb A1c MFr Bld 4.6 (*)    All other components within normal limits  RAPID URINE DRUG SCREEN, HOSP PERFORMED - Abnormal; Notable for the following components:   Tetrahydrocannabinol POSITIVE (*)    All other components within normal limits  COMPREHENSIVE METABOLIC PANEL - Abnormal; Notable for the following components:   Glucose, Bld 108 (*)    Creatinine, Ser 1.03 (*)    All other components within normal limits  CBC - Abnormal; Notable for the following components:   WBC 3.5 (*)    All other components within normal limits  CBC  PROTIME-INR  APTT  LIPID PANEL  I-STAT CG4  LACTIC ACID, ED  TROPONIN I (HIGH SENSITIVITY)  TROPONIN I (HIGH SENSITIVITY)    EKG EKG Interpretation Date/Time:  Thursday November 08 2022 15:58:42 EDT Ventricular Rate:  75 PR Interval:  176 QRS Duration:  128 QT Interval:  379 QTC Calculation: 424 R Axis:   88  Text Interpretation: Sinus rhythm LAE, consider biatrial enlargement IVCD, consider atypical RBBB ST elevation suggests acute pericarditis No significant change since last tracing Confirmed by Melene Plan [phone]) on 11/09/2022 10:16:43 AM  Radiology ECHOCARDIOGRAM COMPLETE  Result Date: 11/09/2022    ECHOCARDIOGRAM REPORT   Patient Name:   [name] Date of Exam:  11/09/2022 Medical Rec #:  [id number]            Height:       71.0 in Accession #:    [id number]           Weight:       140.5 lb Date of Birth:  1961-01-15            BSA:          1.815 m Patient Age:    62 years             BP:           136/82 mmHg Patient Gender: F                    HR:           70 bpm. Exam Location:  Inpatient Procedure: 2D Echo, Cardiac Doppler and Color Doppler Indications:    chest pain  History:        Patient has no prior history of Echocardiogram examinations.                 Abnormal ECG, Signs/Symptoms:Chest Pain; Risk Factors:Current                 Smoker and Hypertension.  Sonographer:    Delcie Roch RDCS Referring Phys: [id number] CHRISTOPHER L SCHUMANN IMPRESSIONS  1. Left ventricular ejection fraction, by estimation, is 60 to 65%. The left ventricle has normal function. The left ventricle has no regional wall motion abnormalities. Left ventricular diastolic parameters are indeterminate.  2. Right ventricular systolic function is normal. The right ventricular size is normal. There is normal pulmonary artery systolic pressure.  3. The mitral valve is normal in structure. Trivial mitral valve regurgitation. No evidence of mitral stenosis.  4. The aortic valve is tricuspid. Aortic valve regurgitation is not visualized. No aortic  stenosis is present.  5. The inferior vena cava is dilated in size with <50% respiratory variability, suggesting right atrial pressure of 15 mmHg. FINDINGS  Left Ventricle: Left ventricular ejection fraction, by estimation, is 60 to 65%. The left ventricle has normal function. The left ventricle has no regional wall motion abnormalities. The left ventricular internal cavity size was normal in size. There is  no left ventricular hypertrophy. Left ventricular diastolic parameters are indeterminate. Right Ventricle: The right ventricular size is normal. No increase in right ventricular wall thickness. Right ventricular systolic function is normal. There is normal pulmonary artery systolic pressure. The tricuspid regurgitant velocity is 1.78 m/s, and  with an assumed right atrial pressure of 15 mmHg, the estimated right ventricular systolic pressure is 27.7 mmHg. Left Atrium: Left atrial size was normal in size. Right Atrium: Right atrial size was normal in size. Pericardium: There is no evidence of pericardial effusion. Mitral Valve: The mitral valve is normal in structure. Trivial mitral valve regurgitation. No evidence of mitral valve stenosis. Tricuspid Valve: The tricuspid valve is normal in structure. Tricuspid valve regurgitation is trivial. No evidence of tricuspid stenosis. Aortic Valve: The aortic valve is tricuspid. Aortic valve regurgitation is not visualized. No aortic stenosis is present. Pulmonic Valve: The pulmonic valve was normal in structure. Pulmonic valve regurgitation is not visualized. No evidence of pulmonic stenosis. Aorta: The aortic root is normal in size and structure. Venous: The inferior vena cava is dilated in size with less than 50% respiratory variability, suggesting right atrial pressure of 15 mmHg. IAS/Shunts: No atrial level shunt detected by color flow Doppler.  LEFT VENTRICLE PLAX 2D LVIDd:         3.90 cm   Diastology LVIDs:         2.50 cm   LV e' medial:    9.14 cm/s LV PW:          1.10 cm   LV E/e' medial:  9.5 LV IVS:        1.00 cm   LV e' lateral:   8.16 cm/s LVOT diam:     2.00 cm   LV E/e' lateral: 10.7 LV SV:         52 LV SV Index:   29 LVOT Area:     3.14 cm  RIGHT VENTRICLE             IVC RV Basal diam:  2.30 cm     IVC diam: 2.30 cm RV S prime:     12.10 cm/s TAPSE (M-mode): 2.2 cm LEFT ATRIUM             Index        RIGHT ATRIUM           Index LA diam:        3.20 cm 1.76 cm/m   RA Area:     13.80 cm LA Vol (A2C):   33.2 ml 18.29 ml/m  RA Volume:   33.10 ml  18.24 ml/m LA Vol (A4C):   50.5 ml 27.83 ml/m LA Biplane Vol: 43.3 ml 23.86 ml/m  AORTIC VALVE LVOT Vmax:   89.80 cm/s LVOT Vmean:  54.800 cm/s LVOT VTI:    0.167 m  AORTA Ao Root diam: 3.30 cm Ao Asc diam:  3.10 cm MITRAL VALVE               TRICUSPID VALVE MV Area (PHT): 3.42 cm    TR Peak grad:   12.7 mmHg MV Decel Time: 222 msec    TR Vmax:        178.00 cm/s MV E velocity: 87.00 cm/s MV A velocity: 85.40 cm/s  SHUNTS MV E/A ratio:  1.02        Systemic VTI:  0.17 m                            Systemic Diam: 2.00 cm Chilton Si MD Electronically signed by Chilton Si MD Signature Date/Time: 11/09/2022/12:01:17 PM    Final     Procedures .Critical Care  Performed by: Margarita Grizzle, MD Authorized by: Margarita Grizzle, MD   Critical care provider statement:    Critical care time (minutes):  30   Critical care was time spent personally by me on the following activities:  Development of treatment plan with patient or surrogate, discussions with consultants, evaluation of patient's response to treatment, examination of patient, ordering and review of laboratory studies, ordering and review of radiographic studies, ordering and performing treatments and interventions, pulse oximetry, re-evaluation of patient's condition and review of old charts     Medications Ordered in ED Medications  heparin injection 4,000 Units (4,000 Units Intravenous Given 11/08/22 1554)  nitroGLYCERIN (NITROSTAT) SL tablet  0.8 mg (0.8 mg Sublingual Given 11/09/22 0848)  iohexol (OMNIPAQUE) 350 MG/ML injection 100 mL (100 mLs Intravenous Contrast Given 11/09/22 0911)    ED Course/ Medical Decision Making/ A&P Clinical Course as of 11/11/22 0934  Thu Nov 08, 2022  1529 Basic metabolic panel reviewed interpreted significant for mildly increased creatinine at 1.29 otherwise within normal  limits CBC reviewed interpreted within normal limits Troponin is normal at 2 [DR]    Clinical Course User Index [DR] Margarita Grizzle, MD                                  Medical Decision Making Amount and/or Complexity of Data Reviewed Labs: ordered. Radiology: ordered.  Risk Prescription drug management. Decision regarding hospitalization.   Initial EKG reviewed with ST elevation in aVR and 3 Repeat EKG obtained and again for ST depression and T wave inversion in 3 and aVF and V3 Patient reports that she was having chest pain at the time of the first EKG but is unclear whether she was having chest pain at the time of the second EKG.  She is currently not having chest pain.  First troponin is negative Discussed with cardiology.  Care discussed with Dr. Bjorn Pippin who also reviewed patient's recent ct lungs and notes no coronary artery calcifications.  He will see patient in consultation.  Heart score 6  .  EKG repeated and normal sinus rhythm ST depression T wave inversion in alternating beats and 3 Repeat troponin normal at 3 Patient seen by Dr. Denice Bors here in the department consultation. He request that patient be admitted to hospitalist team.  Care discussed with hospitalist     Final Clinical Impression(s) / ED Diagnoses Final diagnoses:  Chest pain, unspecified type  Abnormal EKG    Rx / DC Orders ED Discharge Orders     None         Margarita Grizzle, MD 11/08/22 1608    Margarita Grizzle, MD 11/08/22 1620    Margarita Grizzle, MD 11/11/22 [phone]

## 2022-11-09 ENCOUNTER — Observation Stay (HOSPITAL_BASED_OUTPATIENT_CLINIC_OR_DEPARTMENT_OTHER): Payer: BC Managed Care – PPO

## 2022-11-09 ENCOUNTER — Encounter: Payer: Self-pay | Admitting: Nurse Practitioner

## 2022-11-09 DIAGNOSIS — R079 Chest pain, unspecified: Secondary | ICD-10-CM

## 2022-11-09 DIAGNOSIS — R9431 Abnormal electrocardiogram [ECG] [EKG]: Secondary | ICD-10-CM

## 2022-11-09 DIAGNOSIS — Z720 Tobacco use: Secondary | ICD-10-CM | POA: Diagnosis not present

## 2022-11-09 LAB — COMPREHENSIVE METABOLIC PANEL
ALT: 14 U/L (ref 0–44)
AST: 23 U/L (ref 15–41)
Albumin: 3.9 g/dL (ref 3.5–5.0)
Alkaline Phosphatase: 110 U/L (ref 38–126)
Anion gap: 7 (ref 5–15)
BUN: 14 mg/dL (ref 8–23)
CO2: 27 mmol/L (ref 22–32)
Calcium: 9.1 mg/dL (ref 8.9–10.3)
Chloride: 105 mmol/L (ref 98–111)
Creatinine, Ser: 1.03 mg/dL — ABNORMAL HIGH (ref 0.44–1.00)
GFR, Estimated: >60 mL/min (ref >60)
Glucose, Bld: 108 mg/dL — ABNORMAL HIGH (ref 70–99)
Potassium: 3.8 mmol/L (ref 3.5–5.1)
Sodium: 139 mmol/L (ref 135–145)
Total Bilirubin: 0.7 mg/dL (ref 0.3–1.2)
Total Protein: 7.5 g/dL (ref 6.5–8.1)

## 2022-11-09 LAB — CBC
HCT: 40.6 % (ref 36.0–46.0)
Hemoglobin: 12.6 g/dL (ref 12.0–15.0)
MCH: 29.2 pg (ref 26.0–34.0)
MCHC: 31 g/dL (ref 30.0–36.0)
MCV: 94 fL (ref 80.0–100.0)
Platelets: 208 10*3/uL (ref 150–400)
RBC: 4.32 MIL/uL (ref 3.87–5.11)
RDW: 12.3 % (ref 11.5–15.5)
WBC: 3.5 10*3/uL — ABNORMAL LOW (ref 4.0–10.5)
nRBC: 0 % (ref 0.0–0.2)

## 2022-11-09 LAB — ECHOCARDIOGRAM COMPLETE
Area-P 1/2: 3.42 cm2
S' Lateral: 2.5 cm
Weight: 2248 [oz_av]

## 2022-11-09 MED ORDER — UMECLIDINIUM BROMIDE 62.5 MCG/ACT IN AEPB
1.0000 | INHALATION_SPRAY | Freq: Every day | RESPIRATORY_TRACT | Status: DC
  Filled 2022-11-09: qty 7

## 2022-11-09 MED ORDER — NITROGLYCERIN 0.4 MG SL SUBL
0.8000 mg | SUBLINGUAL_TABLET | Freq: Once | SUBLINGUAL | Status: AC
  Administered 2022-11-09: 0.8 mg via SUBLINGUAL

## 2022-11-09 MED ORDER — FLUTICASONE FUROATE-VILANTEROL 100-25 MCG/ACT IN AEPB
1.0000 | INHALATION_SPRAY | Freq: Every day | RESPIRATORY_TRACT | Status: DC
  Filled 2022-11-09: qty 28

## 2022-11-09 MED ORDER — METOPROLOL TARTRATE 5 MG/5ML IV SOLN
INTRAVENOUS | Status: AC
  Filled 2022-11-09: qty 5

## 2022-11-09 MED ORDER — BUPROPION HCL ER (SR) 150 MG PO TB12
150.0000 mg | ORAL_TABLET | Freq: Two times a day (BID) | ORAL | Status: DC
  Filled 2022-11-09: qty 1

## 2022-11-09 MED ORDER — BICTEGRAVIR-EMTRICITAB-TENOFOV 50-200-25 MG PO TABS
1.0000 | ORAL_TABLET | Freq: Every day | ORAL | Status: DC
  Administered 2022-11-09: 1 via ORAL
  Filled 2022-11-09: qty 1

## 2022-11-09 MED ORDER — NITROGLYCERIN 0.4 MG SL SUBL
SUBLINGUAL_TABLET | SUBLINGUAL | Status: AC
  Filled 2022-11-09: qty 2

## 2022-11-09 MED ORDER — IOHEXOL 350 MG/ML SOLN
100.0000 mL | Freq: Once | INTRAVENOUS | Status: AC | PRN
  Administered 2022-11-09: 100 mL via INTRAVENOUS

## 2022-11-09 MED ORDER — BUPROPION HCL ER (SR) 150 MG PO TB12
150.0000 mg | ORAL_TABLET | Freq: Every morning | ORAL | Status: DC
  Administered 2022-11-09: 150 mg via ORAL
  Filled 2022-11-09: qty 1

## 2022-11-09 MED ORDER — METOPROLOL TARTRATE 5 MG/5ML IV SOLN: 10.0000 mg | INTRAVENOUS | Status: DC | PRN

## 2022-11-09 MED ORDER — ACETAMINOPHEN 325 MG PO TABS: 650.0000 mg | ORAL_TABLET | Freq: Four times a day (QID) | ORAL | Status: DC | PRN

## 2022-11-09 MED ORDER — MIRTAZAPINE 7.5 MG PO TABS
7.5000 mg | ORAL_TABLET | Freq: Every day | ORAL | Status: DC
  Filled 2022-11-09: qty 1

## 2022-11-09 MED ORDER — ENOXAPARIN SODIUM 40 MG/0.4ML IJ SOSY
40.0000 mg | PREFILLED_SYRINGE | Freq: Every day | INTRAMUSCULAR | Status: DC
  Administered 2022-11-09: 40 mg via SUBCUTANEOUS
  Filled 2022-11-09: qty 0.4

## 2022-11-09 NOTE — Discharge Summary (Addendum)
DISCHARGE SUMMARY  [name]  MR#: [id number]  DOB:1960/03/31  Date of Admission: 11/08/2022 Date of Discharge: 11/09/2022  Attending Physician:Uchechi Denison Silvestre Gunner, MD  Patient's NUU:VOZDGUY, Tabitha Pounds, NP  Consults: Cardiology  Disposition: D/C home   Follow-up Appts:  Follow-up Information     Ivonne Andrew, NP Follow up in 1 week(s).   Specialties: Pulmonary Disease, Endocrinology Contact information: [address] [place] [address] [phone]                 Tests Needing Follow-up: -assess for ongoing chest pain - if persists, may need evaluation to r/o esophageal or gastric source   Discharge Diagnoses: Chest pain - noncardiac HTN HLD Tobacco abuse HIV positive/asymptomatic HIV infection status  History of hepatitis C History of syphilis COPD Posterior right upper lobe pulmonary nodule  Initial presentation: 62 year old with a history of HTN, HLD, COPD, HIV, hepatitis C, and tobacco abuse who presented to the ER 9/26 with a 3-day history of intermittent chest pain located in the substernal region described as sharp with associated headache and shortness of breath and at times radiating into her neck. Her initial troponin was not elevated. EKG revealed possible low-level ST elevation in leads III and aVR with follow-up EKG suggesting ST depression in 2 3 and aVF.   Hospital Course:  Chest pain CAD risk factors include HTN, HLD, and tobacco abuse -EKG changes suggestive of ischemia -recent CT chest without evidence of coronary calcification -cardiology directed workup - TTE with preserved EF and no WMA - coronary CTa with calcium score of 0 and no significant CAD appreciated - cleared for discharge    HTN Continue usual amlodipine   HLD Continue usual rosuvastatin - LDL 47   Tobacco abuse Has been counseled on the absolute need to discontinue tobacco abuse   HIV Continue usual home medical therapy - appears to be  well-controlled   History of hepatitis C No evidence of acute complications   History of syphilis Followed by Dr. Drue Second in ID clinic   COPD Continue usual home Trelegy   Posterior right upper lobe pulmonary nodule Has been undergoing outpatient workup with recent PET scan revealing no hypermetabolic uptake -to receive follow-up CT scan in outpatient setting under direction of Dr. Tonia Brooms  Allergies as of 11/09/2022       Reactions   Aspirin Other (See Comments)   "Heart murmur"        Medication List     STOP taking these medications    Ensure   ibuprofen 600 MG tablet Commonly known as: ADVIL       TAKE these medications    acetaminophen 500 MG tablet Commonly known as: TYLENOL Take 1 tablet (500 mg total) by mouth every 6 (six) hours as needed. What changed:  how much to take reasons to take this   amLODipine 5 MG tablet Commonly known as: NORVASC TAKE 1 TABLET(5 MG) BY MOUTH DAILY What changed:  how much to take how to take this when to take this additional instructions   Biktarvy 50-200-25 MG Tabs tablet Generic drug: bictegravir-emtricitabine-tenofovir AF Take 1 tablet by mouth daily.   Biotin 5000 MCG Tabs Take 5,000 mcg by mouth daily.   buPROPion 150 MG 12 hr tablet Commonly known as: Wellbutrin SR Take 1 tablet (150 mg total) by mouth 2 (two) times daily. Start with once a day for 3 days then increase to twice a day What changed:  when to take this  additional instructions   cetirizine 10 MG tablet Commonly known as: ZYRTEC Take 10 mg by mouth daily as needed for allergies or rhinitis.   conjugated estrogens vaginal cream Commonly known as: PREMARIN Place 1 Applicatorful vaginally 2 (two) times a week. Place 0.5g nightly for two weeks then twice a week after What changed:  how much to take when to take this additional instructions   Fish Oil 1000 MG Caps Take 1,000 mg by mouth daily.   fluticasone 50 MCG/ACT nasal  spray Commonly known as: FLONASE SHAKE LIQUID AND USE 2 SPRAYS IN EACH NOSTRIL DAILY What changed: See the new instructions.   mirabegron ER 25 MG Tb24 tablet Commonly known as: MYRBETRIQ Take 1 tablet (25 mg total) by mouth daily.   mirtazapine 7.5 MG tablet Commonly known as: REMERON Take 1 tablet (7.5 mg total) by mouth at bedtime.   MULTIVITAMIN GUMMIES ADULTS PO Take 1 tablet by mouth daily with breakfast.   rosuvastatin 5 MG tablet Commonly known as: Crestor Take 1 tablet (5 mg total) by mouth daily.   sodium chloride 0.65 % Soln nasal spray Commonly known as: OCEAN Place 1 spray into both nostrils as needed for congestion.   solifenacin 10 MG tablet Commonly known as: VESICARE Take 10 mg by mouth daily.   Trelegy Ellipta 100-62.5-25 MCG/ACT Aepb Generic drug: Fluticasone-Umeclidin-Vilant INHALE 1 PUFF INTO THE LUNGS EVERY DAY What changed: See the new instructions.   Tums E-X 750 750 MG chewable tablet Generic drug: calcium carbonate Chew 1 tablet by mouth 2 (two) times daily as needed for heartburn.   Vitamin D3 1000 units Caps Take 1,000 Units by mouth daily.        Day of Discharge BP 136/82 (BP Location: Right Arm)   Pulse 60   Temp (!) 97.5 F (36.4 C) (Oral)   Resp 15   Wt 63.7 kg   LMP 03/27/2011   SpO2 98%   BMI 19.60 kg/m   Physical Exam: General: No acute respiratory distress Lungs: Clear to auscultation bilaterally without wheezes or crackles Cardiovascular: Regular rate and rhythm without murmur gallop or rub normal S1 and S2 Abdomen: Nontender, nondistended, soft, bowel sounds positive, no rebound, no ascites, no appreciable mass Extremities: No significant cyanosis, clubbing, or edema bilateral lower extremities  Basic Metabolic Panel: Recent Labs  Lab 11/08/22 1441  NA 140  K 4.2  CL 104  CO2 26  GLUCOSE 96  BUN 22  CREATININE 1.29*  CALCIUM 9.5    CBC: Recent Labs  Lab 11/08/22 1441  WBC 4.1  HGB 12.1  HCT 38.9   MCV 96.0  PLT 195    Time spent in discharge (includes decision making & examination of pt): 30 minutes  11/09/2022, 1:45 PM   Tabitha Blood, MD [facility]  [phone]

## 2022-11-09 NOTE — Progress Notes (Signed)
CSW received consult for patient. CSW spoke with patient at bedside. Patient reports she lives at home alone. SDOH screening complete. Patient accepted food resources. All questions answered. Patient reports her son will pick her up when medically ready for dc. No further questions reported at this time.

## 2022-11-09 NOTE — Progress Notes (Signed)
   11/09/22  To Whom it may concern,  [name] was hospitalized at [facility] from 11/08/2022 through 11/09/2022.  She has been cleared to return to work on but not before 11/10/2022 with no medical restrictions.    Should you have further questions please feel free to contact our office.  Be advised that no medical information will be divulged without a signed HIPA release from the patient.    Sincerely,  Lonia Blood, MD [facility]  [phone]

## 2022-11-09 NOTE — Progress Notes (Signed)
  Echocardiogram 2D Echocardiogram has been performed.  Delcie Roch 11/09/2022, 9:38 AM

## 2022-11-09 NOTE — Progress Notes (Signed)
Rounding Note    Patient Name: [name] Date of Encounter: 11/09/2022  [facility] Cardiologist: None   Subjective   Reports continues to have intermittent chest pain  Inpatient Medications    Scheduled Meds:  Continuous Infusions:  sodium chloride Stopped (11/08/22 1915)   sodium chloride 50 mL/hr at 11/08/22 1914   PRN Meds: acetaminophen **OR** acetaminophen, metoprolol tartrate, ondansetron **OR** ondansetron (ZOFRAN) IV, polyethylene glycol   Vital Signs    Vitals:   11/09/22 0000 11/09/22 0004 11/09/22 0344 11/09/22 0400  BP:  129/79 109/66   Pulse:  78 64   Resp:   17   Temp:  97.9 F (36.6 C)    TempSrc:  Oral Oral   SpO2: 98% 96% 97% 98%  Weight:        Intake/Output Summary (Last 24 hours) at 11/09/2022 0810 Last data filed at 11/09/2022 0538 Gross per 24 hour  Intake 726.05 ml  Output 850 ml  Net -123.95 ml      11/08/2022    6:54 PM 11/02/2022    3:49 PM 08/28/2022    3:50 PM  Last 3 Weights  Weight (lbs) 140 lb 8 oz 146 lb 9.6 oz 148 lb  Weight (kg) 63.73 kg 66.497 kg 67.132 kg      Telemetry    NSR in 60-70s - Personally Reviewed  ECG    No new ECG - Personally Reviewed  Physical Exam   GEN: No acute distress.   Neck: No JVD Cardiac: RRR, no murmurs, rubs, or gallops.  Respiratory: Clear to auscultation bilaterally. GI: Soft, nontender, non-distended  MS: No edema; No deformity. Neuro:  Nonfocal  Psych: Normal affect   Labs    High Sensitivity Troponin:   Recent Labs  Lab 11/08/22 1441 11/08/22 1600  TROPONINIHS 2 3     Chemistry Recent Labs  Lab 11/08/22 1441  NA 140  K 4.2  CL 104  CO2 26  GLUCOSE 96  BUN 22  CREATININE 1.29*  CALCIUM 9.5  GFRNONAA 47*  ANIONGAP 10    Lipids  Recent Labs  Lab 11/08/22 1600  CHOL 135  TRIG 105  HDL 67  LDLCALC 47  CHOLHDL 2.0    Hematology Recent Labs  Lab 11/08/22 1441  WBC 4.1  RBC 4.05  HGB 12.1  HCT 38.9  MCV 96.0  MCH 29.9   MCHC 31.1  RDW 12.5  PLT 195   Thyroid No results for input(s): "TSH", "FREET4" in the last 168 hours.  BNPNo results for input(s): "BNP", "PROBNP" in the last 168 hours.  DDimer No results for input(s): "DDIMER" in the last 168 hours.   Radiology    DG Chest 2 View  Result Date: 11/08/2022 CLINICAL DATA:  Chest pain. EXAM: CHEST - 2 VIEW COMPARISON:  Jun 22, 2013.  April 13, 2022. FINDINGS: The heart size and mediastinal contours are within normal limits. Stable nodular density noted in right upper lobe which most likely is benign based on prior PET scan as well stability since 2015. No acute pulmonary disease is noted. The visualized skeletal structures are unremarkable. IMPRESSION: No acute cardiopulmonary disease. Electronically Signed   By: Lupita Raider M.D.   On: 11/08/2022 16:35    Cardiac Studies     Patient Profile     62 y.o. female with a hx of HIV, hypertension, HCV, tobacco use who is being seen 11/08/2022 for the evaluation of chest pain  Assessment & Plan  Chest pain: Reports atypical chest pain, however does report can occur with stress or exertion, suggesting possible angina.  In addition, she had dynamic EKG changes while in the ED, with development of 1 mm ST depression/T wave inversions in inferior leads.  She does have multiple CAD risk factors (tobacco use, HTN, HLD), though recent CT chest does not show coronary calcifications.  Troponins negative, but suspect may not bump troponins given short duration of chest pain -Plan coronary CTA today for further evaluation -Echocardiogram   Hypertension: Continue amlodipine 5 mg daily   Hyperlipidemia: Continue rosuvastatin 5 mg daily.  LDL 47   Tobacco use: needs to quit.  For questions or updates, please contact [facility] Please consult [email or web address] for contact info under        Signed, Little Ishikawa, MD  11/09/2022, 8:10 AM

## 2022-11-14 NOTE — Progress Notes (Signed)
Patient has an appt on 10/11 to discuss results with SG, NP in clinic.   Thanks,  BLI  Josephine Igo, DO [facility] 11/14/2022 4:27 PM

## 2022-11-23 ENCOUNTER — Telehealth: Payer: Self-pay | Admitting: Acute Care

## 2022-11-23 ENCOUNTER — Ambulatory Visit: Payer: BC Managed Care – PPO | Admitting: Acute Care

## 2022-11-23 NOTE — Telephone Encounter (Signed)
I have attempted to call the patient with the results of their  Low Dose CT Chest Lung cancer screening scan. There was no answer. I have left a HIPPA compliant VM requesting the patient call the office for the scan results. I included the office contact information in the message. We will await his return call. If no return call we will continue to call until patient is contacted.   Sherre Lain and Ashley. She cancelled her appointment this morning.  She will need follow up scan . I have asked her to call so that we can go over the scan results and get a follow up scan scheduled. Let me know if she returns the call. Thanks so much

## 2022-12-12 ENCOUNTER — Telehealth: Payer: Self-pay | Admitting: Acute Care

## 2022-12-12 NOTE — Telephone Encounter (Signed)
Spoke with Kandice Robinsons NP - pt needs to be rescheduled and have a 6 month follow up scan. Called and left VM for pt. Maralyn Sago has an opening on 11/1 at 0830.

## 2022-12-17 ENCOUNTER — Encounter: Payer: Self-pay | Admitting: Emergency Medicine

## 2022-12-17 NOTE — Telephone Encounter (Signed)
Called patient, unable to leave VM as VM box is full. Letter printed and mailed to patient to call office to schedule.

## 2022-12-19 ENCOUNTER — Encounter: Payer: Self-pay | Admitting: Nurse Practitioner

## 2022-12-19 ENCOUNTER — Ambulatory Visit (INDEPENDENT_AMBULATORY_CARE_PROVIDER_SITE_OTHER): Payer: BC Managed Care – PPO | Admitting: Nurse Practitioner

## 2022-12-19 ENCOUNTER — Ambulatory Visit: Payer: Self-pay | Admitting: Nurse Practitioner

## 2022-12-19 VITALS — BP 141/84 | HR 83 | Temp 98.6°F | Resp 12 | Ht 69.0 in | Wt 144.0 lb

## 2022-12-19 DIAGNOSIS — M545 Low back pain, unspecified: Secondary | ICD-10-CM | POA: Diagnosis not present

## 2022-12-19 LAB — POCT URINALYSIS DIP (CLINITEK)
Bilirubin, UA: NEGATIVE
Glucose, UA: NEGATIVE mg/dL
Ketones, POC UA: NEGATIVE mg/dL
Leukocytes, UA: NEGATIVE
Nitrite, UA: NEGATIVE
POC PROTEIN,UA: 30 — AB
Spec Grav, UA: 1.025 (ref 1.010–1.025)
Urobilinogen, UA: 0.2 U/dL
pH, UA: 6 (ref 5.0–8.0)

## 2022-12-19 MED ORDER — KETOROLAC TROMETHAMINE 30 MG/ML IJ SOLN
30.0000 mg | Freq: Once | INTRAMUSCULAR | Status: AC
Start: 2022-12-19 — End: 2022-12-19
  Administered 2022-12-19: 30 mg via INTRAMUSCULAR

## 2022-12-19 MED ORDER — METHYLPREDNISOLONE SODIUM SUCC 40 MG IJ SOLR
40.0000 mg | Freq: Once | INTRAMUSCULAR | Status: AC
Start: 2022-12-19 — End: 2022-12-19
  Administered 2022-12-19: 40 mg via INTRAMUSCULAR

## 2022-12-19 MED ORDER — METHYLPREDNISOLONE 4 MG PO TBPK
ORAL_TABLET | ORAL | 0 refills | Status: DC
Start: 2022-12-19 — End: 2023-08-19

## 2022-12-19 NOTE — Progress Notes (Signed)
Pt is here for possible UTI   Lower back pain started X2 days ago No injury to same per pt

## 2022-12-19 NOTE — Patient Instructions (Signed)
You were given Toradol 30 mg injection and Solu-Medrol 40 mg injection in the office today  Please start taking the Medrol dose pack ordered starting tomorrow.  Take the medication with meals  Take Tylenol 650 mg every 6 hours as needed I also encouraged the use of heating pad or ice with stretching exercises.  1. Acute low back pain without sciatica, unspecified back pain laterality  - POCT URINALYSIS DIP (CLINITEK) - methylPREDNISolone (MEDROL DOSEPAK) 4 MG TBPK tablet; Take as instructed on the packaging  Dispense: 1 each; Refill: 0   It is important that you exercise regularly at least 30 minutes 5 times a week as tolerated  Think about what you will eat, plan ahead. Choose " clean, green, fresh or frozen" over canned, processed or packaged foods which are more sugary, salty and fatty. 70 to 75% of food eaten should be vegetables and fruit. Three meals at set times with snacks allowed between meals, but they must be fruit or vegetables. Aim to eat over a 12 hour period , example 7 am to 7 pm, and STOP after  your last meal of the day. Drink water,generally about 64 ounces per day, no other drink is as healthy. Fruit juice is best enjoyed in a healthy way, by EATING the fruit.  Thanks for choosing [facility] we consider it a privelige to serve you.

## 2022-12-19 NOTE — Progress Notes (Deleted)
   Acute Office Visit  Subjective:     Patient ID: [name], female    DOB: 04-11-1960, 62 y.o.   MRN: [id number]  Chief Complaint  Patient presents with   Back Pain    HPI Patient is in today for   back pain for 2 days ago . No other sym[ptoms, nothing makes it ebetter. Aching pain rated 8/10. Has had pain before  years ago . Does a lot owf beninf puching Review of Systems      Objective:    BP (!) 141/84 (BP Location: Right Arm, Patient Position: Sitting, Cuff Size: Normal)   Pulse 83   Temp 98.6 F (37 C)   Resp 12   Ht 5\' 9"  (1.753 m)   Wt 144 lb (65.3 kg)   LMP 03/27/2011   SpO2 100%   BMI 21.27 kg/m  {Vitals History (Optional):23777}  Physical Exam  Results for orders placed or performed in visit on 12/19/22  POCT URINALYSIS DIP (CLINITEK)  Result Value Ref Range   Color, UA yellow yellow   Clarity, UA clear clear   Glucose, UA negative negative mg/dL   Bilirubin, UA negative negative   Ketones, POC UA negative negative mg/dL   Spec Grav, UA 4.098 1.191 - 1.025   Blood, UA moderate (A) negative   pH, UA 6.0 5.0 - 8.0   POC PROTEIN,UA =30 (A) negative, trace   Urobilinogen, UA 0.2 0.2 or 1.0 E.U./dL   Nitrite, UA Negative Negative   Leukocytes, UA Negative Negative        Assessment & Plan:   Problem List Items Addressed This Visit   None Visit Diagnoses     Acute low back pain without sciatica, unspecified back pain laterality    -  Primary   Relevant Orders   POCT URINALYSIS DIP (CLINITEK) (Completed)       No orders of the defined types were placed in this encounter.   No follow-ups on file.  Donell Beers, FNP

## 2022-12-19 NOTE — Progress Notes (Signed)
Acute Office Visit  Subjective:     Patient ID: [name], female    DOB: 1960/07/27, 62 y.o.   MRN: [id number]  Chief Complaint  Patient presents with   Back Pain    Back Pain Pertinent negatives include no abdominal pain, chest pain, dysuria, fever, headaches, numbness or weakness.     [name]  has a past medical history of Anxiety (11/2018), Asthma, Hepatitis C, HIV (human immunodeficiency virus infection) (HCC), Hypertension, STD (sexually transmitted disease), Substance abuse (HCC), and Underweight (07/29/2014).    Patient presents with complaints of low mid back pain ongoing for the past 2 days.  Currently has aching pain rated 8/10, never had this type of pain before.  Pain is worse with ambulation.  She has been taking Tylenol without relief.  She does a lot of bending and standing at work.  Patient denies abdominal pain, nausea, vomiting, dysuria, urinary odor, flank pain, trauma.  No complaints of bowel or urinary incontinence   Review of Systems  Constitutional:  Negative for appetite change, chills, fatigue and fever.  HENT:  Negative for congestion, postnasal drip, rhinorrhea and sneezing.   Respiratory:  Negative for cough, shortness of breath and wheezing.   Cardiovascular:  Negative for chest pain, palpitations and leg swelling.  Gastrointestinal:  Negative for abdominal pain, constipation, nausea and vomiting.  Genitourinary:  Negative for difficulty urinating, dysuria, flank pain and frequency.  Musculoskeletal:  Positive for back pain. Negative for arthralgias, joint swelling and myalgias.  Skin:  Negative for color change, pallor, rash and wound.  Neurological:  Negative for dizziness, facial asymmetry, weakness, numbness and headaches.  Psychiatric/Behavioral:  Negative for behavioral problems, confusion, self-injury and suicidal ideas.         Objective:    BP (!) 141/84 (BP Location: Right Arm, Patient Position: Sitting, Cuff Size:  Normal)   Pulse 83   Temp 98.6 F (37 C)   Resp 12   Ht 5\' 9"  (1.753 m)   Wt 144 lb (65.3 kg)   LMP 03/27/2011   SpO2 100%   BMI 21.27 kg/m    Physical Exam Vitals and nursing note reviewed.  Constitutional:      General: She is not in acute distress.    Appearance: Normal appearance. She is not ill-appearing, toxic-appearing or diaphoretic.  HENT:     Mouth/Throat:     Mouth: Mucous membranes are moist.     Pharynx: Oropharynx is clear. No oropharyngeal exudate or posterior oropharyngeal erythema.  Eyes:     General: No scleral icterus.       Right eye: No discharge.        Left eye: No discharge.     Extraocular Movements: Extraocular movements intact.     Conjunctiva/sclera: Conjunctivae normal.  Cardiovascular:     Rate and Rhythm: Normal rate and regular rhythm.     Pulses: Normal pulses.     Heart sounds: Normal heart sounds. No murmur heard.    No friction rub. No gallop.  Pulmonary:     Effort: Pulmonary effort is normal. No respiratory distress.     Breath sounds: Normal breath sounds. No stridor. No wheezing, rhonchi or rales.  Chest:     Chest wall: No tenderness.  Abdominal:     General: There is no distension.     Palpations: Abdomen is soft.     Tenderness: There is no abdominal tenderness. There is no right CVA tenderness, left CVA tenderness or guarding.  Musculoskeletal:  General: Tenderness present. No swelling, deformity or signs of injury.     Right lower leg: No edema.     Left lower leg: No edema.     Comments: Tenderness on palpation of lower mid back area.  Skin warm and dry no redness or swelling noted  Skin:    General: Skin is warm and dry.     Capillary Refill: Capillary refill takes less than 2 seconds.     Coloration: Skin is not jaundiced or pale.     Findings: No bruising, erythema or lesion.  Neurological:     Mental Status: She is alert and oriented to person, place, and time.     Motor: No weakness.     Coordination:  Coordination normal.     Gait: Gait normal.  Psychiatric:        Mood and Affect: Mood normal.        Behavior: Behavior normal.        Thought Content: Thought content normal.        Judgment: Judgment normal.     Results for orders placed or performed in visit on 12/19/22  POCT URINALYSIS DIP (CLINITEK)  Result Value Ref Range   Color, UA yellow yellow   Clarity, UA clear clear   Glucose, UA negative negative mg/dL   Bilirubin, UA negative negative   Ketones, POC UA negative negative mg/dL   Spec Grav, UA 9.629 5.284 - 1.025   Blood, UA moderate (A) negative   pH, UA 6.0 5.0 - 8.0   POC PROTEIN,UA =30 (A) negative, trace   Urobilinogen, UA 0.2 0.2 or 1.0 E.U./dL   Nitrite, UA Negative Negative   Leukocytes, UA Negative Negative        Assessment & Plan:   Problem List Items Addressed This Visit       Other   Acute low back pain without sciatica - Primary    1. Acute low back pain without sciatica, unspecified back pain laterality  - POCT URINALYSIS DIP (CLINITEK) negative for UTI - methylPREDNISolone (MEDROL DOSEPAK) 4 MG TBPK tablet; Take as instructed on the packaging  Dispense: 1 each; Refill: 0 .start medication tomorrow encouraged to take medication with meal to prevent GI side effects - ketorolac (TORADOL) 30 MG/ML injection 30 mg - methylPREDNISolone sodium succinate (SOLU-MEDROL) 40 mg/mL injection 40 mg  Application of heat, ice, stretching exercises encouraged Take Tylenol 650 mg every 6 hours as needed for pain      Relevant Medications   methylPREDNISolone (MEDROL DOSEPAK) 4 MG TBPK tablet   Other Relevant Orders   POCT URINALYSIS DIP (CLINITEK) (Completed)    Meds ordered this encounter  Medications   methylPREDNISolone (MEDROL DOSEPAK) 4 MG TBPK tablet    Sig: Take as instructed on the packaging    Dispense:  1 each    Refill:  0   ketorolac (TORADOL) 30 MG/ML injection 30 mg   methylPREDNISolone sodium succinate (SOLU-MEDROL) 40 mg/mL  injection 40 mg    No follow-ups on file.  Donell Beers, FNP

## 2022-12-19 NOTE — Assessment & Plan Note (Signed)
1. Acute low back pain without sciatica, unspecified back pain laterality  - POCT URINALYSIS DIP (CLINITEK) negative for UTI - methylPREDNISolone (MEDROL DOSEPAK) 4 MG TBPK tablet; Take as instructed on the packaging  Dispense: 1 each; Refill: 0 .start medication tomorrow encouraged to take medication with meal to prevent GI side effects - ketorolac (TORADOL) 30 MG/ML injection 30 mg - methylPREDNISolone sodium succinate (SOLU-MEDROL) 40 mg/mL injection 40 mg  Application of heat, ice, stretching exercises encouraged Take Tylenol 650 mg every 6 hours as needed for pain

## 2022-12-21 NOTE — Telephone Encounter (Signed)
Called patient, VM box is full.

## 2022-12-25 NOTE — Telephone Encounter (Signed)
Attempted to contact pt. Left detailed message for pt to call to reschedule CT follow up appt to a sooner date. Pt is currently scheduled to see Buelah Manis 01/16/23.

## 2022-12-27 ENCOUNTER — Ambulatory Visit (INDEPENDENT_AMBULATORY_CARE_PROVIDER_SITE_OTHER): Payer: BC Managed Care – PPO | Admitting: Obstetrics and Gynecology

## 2022-12-27 VITALS — BP 132/78 | HR 86

## 2022-12-27 DIAGNOSIS — N819 Female genital prolapse, unspecified: Secondary | ICD-10-CM | POA: Diagnosis not present

## 2022-12-27 DIAGNOSIS — N3281 Overactive bladder: Secondary | ICD-10-CM | POA: Diagnosis not present

## 2022-12-27 DIAGNOSIS — N952 Postmenopausal atrophic vaginitis: Secondary | ICD-10-CM

## 2022-12-27 DIAGNOSIS — R350 Frequency of micturition: Secondary | ICD-10-CM | POA: Diagnosis not present

## 2022-12-27 DIAGNOSIS — N812 Incomplete uterovaginal prolapse: Secondary | ICD-10-CM

## 2022-12-27 NOTE — Progress Notes (Signed)
[facility] Return Visit  SUBJECTIVE  History of Present Illness: [name] is a 62 y.o. female seen in follow-up for OAB. Plan at last visit was change from Vesicare to [facility] and start vaginal estrogen.     She reports her vaginal dryness has improved with the use of the premarin.     Past Medical History: Patient  has a past medical history of Anxiety (11/2018), Asthma, Hepatitis C, HIV (human immunodeficiency virus infection) (HCC), Hypertension, STD (sexually transmitted disease), Substance abuse (HCC), and Underweight (07/29/2014).   Past Surgical History: She  has a past surgical history that includes Colonoscopy with propofol (N/A, 06/26/2016); laparoscopic appendectomy (N/A, 07/20/2018); Pancreatectomy; Bunionectomy (Left, 03/15/2020); and fractured wrist (Left).   Medications: She has a current medication list which includes the following prescription(s): acetaminophen, amlodipine, biktarvy, biotin, bupropion, cetirizine, vitamin d3, conjugated estrogens, fluticasone, trelegy ellipta, methylprednisolone, mirabegron er, mirtazapine, multiple vitamins-minerals, fish oil, rosuvastatin, sodium chloride, solifenacin, tums e-x 750, and methocarbamol.   Allergies: Patient is allergic to aspirin.   Social History: Patient  reports that she has been smoking cigarettes. She has a 16.5 pack-year smoking history. She has never been exposed to tobacco smoke. She has never used smokeless tobacco. She reports current alcohol use of about 5.0 standard drinks of alcohol per week. She reports that she does not use drugs.      OBJECTIVE     Physical Exam: Vitals:   12/27/22 1550  BP: 132/78  Pulse: 86   Gen: No apparent distress, A&O x 3.  Detailed Urogynecologic Evaluation:  Deferred.    ASSESSMENT AND PLAN    Ms. Lehane is a 62 y.o. with:  1. Overactive bladder   2. Urinary frequency   3. Vaginal atrophy    Patient reports she is doing well on  the Myrbetriq, but that she may have still been taking the vesicare in conjunction with this. I encouraged her to make sure she is not taking the vesicare and only the Myrbetriq.  Patient to continue on Myrbetriq 25mg  daily.  Patient reports her atrophy has improved with premarin and she is to use it twice weekly to maintain.  For patient's prolapse she reports she cannot afford to have this addressed at this time. We discussed that we could do a pessary in the office and she is uncomfortable with the idea at this time. Patient to let us know if she would like a pessary in the future or if she would like to see Dr. Florian Buff for surgical planning.   Patient to follow up in 6 months or sooner if needed.

## 2022-12-29 ENCOUNTER — Ambulatory Visit (HOSPITAL_COMMUNITY)
Admission: EM | Admit: 2022-12-29 | Discharge: 2022-12-29 | Disposition: A | Discharge status: Home / Self Care | Payer: BC Managed Care – PPO | Attending: Internal Medicine | Admitting: Internal Medicine

## 2022-12-29 ENCOUNTER — Encounter (HOSPITAL_COMMUNITY): Payer: Self-pay

## 2022-12-29 DIAGNOSIS — R1030 Lower abdominal pain, unspecified: Secondary | ICD-10-CM | POA: Diagnosis present

## 2022-12-29 DIAGNOSIS — M545 Low back pain, unspecified: Secondary | ICD-10-CM | POA: Insufficient documentation

## 2022-12-29 DIAGNOSIS — R350 Frequency of micturition: Secondary | ICD-10-CM | POA: Insufficient documentation

## 2022-12-29 LAB — POCT URINALYSIS DIP (MANUAL ENTRY)
Bilirubin, UA: NEGATIVE
Glucose, UA: NEGATIVE mg/dL
Leukocytes, UA: NEGATIVE
Nitrite, UA: NEGATIVE
Protein Ur, POC: NEGATIVE mg/dL
Spec Grav, UA: 1.02 (ref 1.010–1.025)
Urobilinogen, UA: 1 U/dL
pH, UA: 6.5 (ref 5.0–8.0)

## 2022-12-29 MED ORDER — METHOCARBAMOL 500 MG PO TABS: 500.0000 mg | ORAL_TABLET | Freq: Two times a day (BID) | ORAL | 0 refills | Status: DC

## 2022-12-29 NOTE — Discharge Instructions (Signed)
I will contact you if any of your testing is abnormal.  Start Robaxin up to twice a day to see if this will help with your back pain.  This will make you sleepy so do not drive or drink alcohol taking it.  You can take Tylenol for breakthrough pain.  If all of your testing is negative and your pain does not improve I do think you should follow-up with your OB/GYN as it is possible that uterine prolapse is contributing to your symptoms.  Unfortunately, I do not have the ability to order an ultrasound to investigate this.  If anything worsens and you have severe pain, fever, nausea, vomiting, changes in your bowel habits, weakness you need to be seen immediately.

## 2022-12-29 NOTE — ED Provider Notes (Signed)
[facility]    CSN: [id number] Arrival date & time: 12/29/22  1443      History   Chief Complaint Chief Complaint  Patient presents with   Abdominal Pain   Back Pain   Urinary Frequency    HPI [name] is a 62 y.o. female.   Patient presents today with a several week history of lower abdominal and back pain.  She was seen by her primary care provider on 12/19/2022 at which when she had protein and blood in her urine but no other evidence of infection.  Symptoms were attributed to musculoskeletal etiology of back pain and she was given injection of steroids and Toradol.  She was then started on methylprednisolone taper.  She reports that despite this medication regimen she continues to have pain.  She denies any known injury or increased activity prior to symptom onset.  She reports that the pain feels connected between her lower back and lower abdomen.  It is rated 8 on a 0-10 pain scale, described as pressure, no alleviating factors identified.  She does report frequency and urgency but denies any dysuria, hematuria, pelvic pain, vaginal discharge, vaginal odor, fever, nausea, vomiting.  She denies any recent antibiotic use.  She denies history of recurrent UTI, single kidney, self-catheterization, recent urogenital procedure.    Past Medical History:  Diagnosis Date   Anxiety 11/2018   Asthma    Hepatitis C    Has been treated   HIV (human immunodeficiency virus infection) (HCC)    Hypertension    STD (sexually transmitted disease)    Substance abuse (HCC)    Underweight 07/29/2014    Patient Active Problem List   Diagnosis Date Noted   Acute low back pain without sciatica 12/19/2022   Chest pain 11/08/2022   Abnormal EKG 11/08/2022   Screening for cervical cancer 04/16/2022   Weight loss 04/16/2022   Nonintractable headache 08/10/2021   Cystocele with prolapse 07/21/2020   Callus of foot 12/12/2016   Multiple renal cysts 08/14/2016    Constipation 11/30/2014   Essential hypertension 11/30/2014   Hot flashes, menopausal 01/27/2014   Asthma 01/23/2011   HEADACHE 04/14/2008   CHOLELITHIASIS 12/15/2007   PERIPHERAL NEUROPATHY 11/07/2007   Avulsion fracture of tooth 07/23/2007   Depression 07/24/2006   Human immunodeficiency virus (HIV) disease (HCC) 12/14/2005   MACROCYTIC ANEMIA 12/14/2005   Tobacco use 12/14/2005   SUBSTANCE ABUSE 12/14/2005   GERD 12/14/2005   PNEUMONIA, HX OF 12/14/2005   COLPOSCOPY, HX OF 12/14/2005    Past Surgical History:  Procedure Laterality Date   BUNIONECTOMY Left 03/15/2020   COLONOSCOPY WITH PROPOFOL N/A 06/26/2016   Procedure: COLONOSCOPY WITH PROPOFOL;  Surgeon: Kathi Der, MD;  Location: [facility];  Service: Gastroenterology;  Laterality: N/A;   fractured wrist Left    LAPAROSCOPIC APPENDECTOMY N/A 07/20/2018   Procedure: APPENDECTOMY LAPAROSCOPIC;  Surgeon: Berna Bue, MD;  Location: [facility];  Service: General;  Laterality: N/A;   PANCREATECTOMY      OB History     Gravida  4   Para  2   Term  2   Preterm      AB  2   Living  2      SAB      IAB  2   Ectopic      Multiple      Live Births  2            Home Medications    Prior  to Admission medications   Medication Sig Start Date End Date Taking? Authorizing Provider  methocarbamol (ROBAXIN) 500 MG tablet Take 1 tablet (500 mg total) by mouth 2 (two) times daily. 12/29/22  Yes Jadee Golebiewski, Noberto Retort, PA-C  acetaminophen (TYLENOL) 500 MG tablet Take 1 tablet (500 mg total) by mouth every 6 (six) hours as needed. Patient taking differently: Take 1,000 mg by mouth every 6 (six) hours as needed for mild pain (pain score 1-3), moderate pain (pain score 4-6) or headache (or abdominal pain). 02/20/18   Bast, Gloris Manchester A, NP  amLODipine (NORVASC) 5 MG tablet TAKE 1 TABLET(5 MG) BY MOUTH DAILY Patient taking differently: Take 5 mg by mouth in the morning. 04/27/22   Ivonne Andrew, NP   bictegravir-emtricitabine-tenofovir AF (BIKTARVY) 50-200-25 MG TABS tablet Take 1 tablet by mouth daily. 08/28/22   Judyann Munson, MD  Biotin 5000 MCG TABS Take 5,000 mcg by mouth daily.    [provider]  buPROPion (WELLBUTRIN SR) 150 MG 12 hr tablet Take 1 tablet (150 mg total) by mouth 2 (two) times daily. Start with once a day for 3 days then increase to twice a day Patient taking differently: Take 150 mg by mouth in the morning. 08/28/22   Judyann Munson, MD  cetirizine (ZYRTEC) 10 MG tablet Take 10 mg by mouth daily as needed for allergies or rhinitis.    [provider]  Cholecalciferol (VITAMIN D3) 1000 units CAPS Take 1,000 Units by mouth daily.    [provider]  conjugated estrogens (PREMARIN) vaginal cream Place 1 Applicatorful vaginally 2 (two) times a week. Place 0.5g nightly for two weeks then twice a week after Patient taking differently: Place 0.5 g vaginally See admin instructions. Beginning on 10/26/2022, place 0.5 g vaginally at bedtime for two weeks, then twice a week thereafter 10/25/22   Selmer Dominion, NP  fluticasone (FLONASE) 50 MCG/ACT nasal spray SHAKE LIQUID AND USE 2 SPRAYS IN EACH NOSTRIL DAILY Patient taking differently: Place 2 sprays into both nostrils daily as needed for allergies or rhinitis. 10/22/22   Ivonne Andrew, NP  Fluticasone-Umeclidin-Vilant (TRELEGY ELLIPTA) 100-62.5-25 MCG/ACT AEPB INHALE 1 PUFF INTO THE LUNGS EVERY DAY Patient taking differently: Inhale 1 puff into the lungs daily. 11/01/22   Icard, Rachel Bo, DO  methylPREDNISolone (MEDROL DOSEPAK) 4 MG TBPK tablet Take as instructed on the packaging 12/19/22   Donell Beers, FNP  mirabegron ER (MYRBETRIQ) 25 MG TB24 tablet Take 1 tablet (25 mg total) by mouth daily. 10/25/22   Selmer Dominion, NP  mirtazapine (REMERON) 7.5 MG tablet Take 1 tablet (7.5 mg total) by mouth at bedtime. 08/28/22   Judyann Munson, MD  Multiple Vitamins-Minerals (MULTIVITAMIN GUMMIES  ADULTS PO) Take 1 tablet by mouth daily with breakfast.    [provider]  Omega-3 Fatty Acids (FISH OIL) 1000 MG CAPS Take 1,000 mg by mouth daily.    [provider]  rosuvastatin (CRESTOR) 5 MG tablet Take 1 tablet (5 mg total) by mouth daily. 04/27/22   Ivonne Andrew, NP  sodium chloride (OCEAN) 0.65 % SOLN nasal spray Place 1 spray into both nostrils as needed for congestion. 10/22/19   Ginnie Smart, MD  solifenacin (VESICARE) 10 MG tablet Take 10 mg by mouth daily.    [provider]  TUMS E-X 750 750 MG chewable tablet Chew 1 tablet by mouth 2 (two) times daily as needed for heartburn.    [provider]    Family History  Family History  Problem Relation Age of Onset   Cancer Mother        bone   Diabetes Sister    Colon cancer Maternal Uncle    Esophageal cancer Neg Hx    Gastric cancer Neg Hx    Pancreatic cancer Neg Hx    Liver disease Neg Hx    Inflammatory bowel disease Neg Hx     Social History Social History   Tobacco Use   Smoking status: Some Days    Current packs/day: 0.50    Average packs/day: 0.5 packs/day for 33.0 years (16.5 ttl pk-yrs)    Types: Cigarettes    Passive exposure: Never   Smokeless tobacco: Never   Tobacco comments:    Smokes 4 packs a cigarettes a week. 04/30/2022 Tay  Vaping Use   Vaping status: Never Used  Substance Use Topics   Alcohol use: Yes    Alcohol/week: 5.0 standard drinks of alcohol    Types: 5 Cans of beer per week   Drug use: No    Comment: clean for 10 years     Allergies   Aspirin   Review of Systems Review of Systems  Constitutional:  Positive for activity change. Negative for appetite change, fatigue and fever.  Gastrointestinal:  Positive for abdominal pain. Negative for diarrhea, nausea and vomiting.  Genitourinary:  Positive for frequency and urgency. Negative for dysuria, flank pain, pelvic pain, vaginal bleeding, vaginal discharge and vaginal pain.   Musculoskeletal:  Positive for back pain. Negative for arthralgias and myalgias.     Physical Exam Triage Vital Signs ED Triage Vitals [12/29/22 1514]  Encounter Vitals Group     BP (!) 137/91     Systolic BP Percentile      Diastolic BP Percentile      Pulse Rate 75     Resp 14     Temp 98 F (36.7 C)     Temp Source Oral     SpO2 98 %     Weight      Height      Head Circumference      Peak Flow      Pain Score 8     Pain Loc      Pain Education      Exclude from Growth Chart    No data found.  Updated Vital Signs BP (!) 137/91 (BP Location: Right Arm)   Pulse 75   Temp 98 F (36.7 C) (Oral)   Resp 14   LMP 03/27/2011   SpO2 98%   Visual Acuity Right Eye Distance:   Left Eye Distance:   Bilateral Distance:    Right Eye Near:   Left Eye Near:    Bilateral Near:     Physical Exam Vitals reviewed. Exam conducted with a chaperone present.  Constitutional:      General: She is awake. She is not in acute distress.    Appearance: Normal appearance. She is well-developed. She is not ill-appearing.     Comments: Very pleasant female appears stated age in no acute distress sitting comfortably in exam room  HENT:     Head: Normocephalic and atraumatic.  Cardiovascular:     Rate and Rhythm: Normal rate and regular rhythm.     Heart sounds: Normal heart sounds, S1 normal and S2 normal. No murmur heard. Pulmonary:     Effort: Pulmonary effort is normal.     Breath sounds: Normal breath sounds. No wheezing, rhonchi or rales.  Comments: Clear to auscultation bilaterally Abdominal:     General: Bowel sounds are normal.     Palpations: Abdomen is soft.     Tenderness: There is abdominal tenderness in the suprapubic area. There is no right CVA tenderness, left CVA tenderness, guarding or rebound. Negative signs include Rovsing's sign, McBurney's sign and psoas sign.     Comments: Mild tenderness palpation throughout lower abdomen worse in suprapubic region.  No  evidence of acute abdomen on physical exam.  No CVA tenderness.  Genitourinary:    Labia:        Right: No rash or tenderness.        Left: No rash or tenderness.      Vagina: Normal.     Cervix: Eversion present.     Uterus: With uterine prolapse.      Adnexa: Right adnexa normal and left adnexa normal.     Comments: Dee, CMA present as chaperone during exam.  Eversion noted of cervical os.  Uterine prolapse to approximately 1 inch deep of introitus.  No pain on bimanual exam.  No CMT. Musculoskeletal:     Cervical back: No tenderness or bony tenderness.     Thoracic back: No tenderness or bony tenderness.     Lumbar back: No tenderness or bony tenderness.     Comments: Back: No pain percussion of vertebrae.  No deformity or step-off noted.  No tenderness to palpation of paraspinal muscles.  Strength 5/5 bilateral lower extremities.  Psychiatric:        Behavior: Behavior is cooperative.      UC Treatments / Results  Labs (all labs ordered are listed, but only abnormal results are displayed) Labs Reviewed  POCT URINALYSIS DIP (MANUAL ENTRY) - Abnormal; Notable for the following components:      Result Value   Ketones, POC UA trace (5) (*)    Blood, UA trace-intact (*)    All other components within normal limits  URINE CULTURE  CERVICOVAGINAL ANCILLARY ONLY    EKG   Radiology No results found.  Procedures Procedures (including critical care time)  Medications Ordered in UC Medications - No data to display  Initial Impression / Assessment and Plan / UC Course  I have reviewed the triage vital signs and the nursing notes.  Pertinent labs & imaging results that were available during my care of the patient were reviewed by me and considered in my medical decision making (see chart for details).     Patient is well-appearing, afebrile, nontoxic, nontachycardic.  Vital signs of physical exam are reassuring with no indication for emergent evaluation or imaging.  Urine  had trace hemoglobin but no other evidence of infection.  Given she has suprapubic tenderness will send this for culture but defer antibiotics until culture results are available.  Discussed symptoms could be musculoskeletal in nature though she does have minimal tenderness on exam.  Will start Robaxin to help with pain and we discussed this can be sedating and she is not sure if drink alcohol taking it.  We discussed that uterine prolapse could be contributing to her symptoms and encouraged her to follow-up with her OB/GYN for further evaluation and management if her symptoms are not improving with conservative treatment measures.  She did have a small amount of discharge and so testing for BV and yeast was obtained and is pending.  She had no concern for STI and declined additional testing.  Discussed that if at any point she has worsening symptoms she needs  to be seen immediately.  Strict return precautions given.  All questions were answered to patient satisfaction.  Final Clinical Impressions(s) / UC Diagnoses   Final diagnoses:  Lower abdominal pain  Acute bilateral low back pain without sciatica  Urinary frequency     Discharge Instructions      I will contact you if any of your testing is abnormal.  Start Robaxin up to twice a day to see if this will help with your back pain.  This will make you sleepy so do not drive or drink alcohol taking it.  You can take Tylenol for breakthrough pain.  If all of your testing is negative and your pain does not improve I do think you should follow-up with your OB/GYN as it is possible that uterine prolapse is contributing to your symptoms.  Unfortunately, I do not have the ability to order an ultrasound to investigate this.  If anything worsens and you have severe pain, fever, nausea, vomiting, changes in your bowel habits, weakness you need to be seen immediately.     ED Prescriptions     Medication Sig Dispense Auth. Provider   methocarbamol  (ROBAXIN) 500 MG tablet Take 1 tablet (500 mg total) by mouth 2 (two) times daily. 20 tablet Elienai Gailey, Noberto Retort, PA-C      PDMP not reviewed this encounter.   Jeani Hawking, PA-C 12/29/22 1623

## 2022-12-29 NOTE — ED Triage Notes (Signed)
Patient states she has had left lower back pain and lower abdominal pain x 2 weeks. Patient also c/o urinary frequency. Patient states she saw her PCP last week and "did a urine specimen, received 2 shots of something and was told it was probably her back."

## 2022-12-31 ENCOUNTER — Encounter: Payer: Self-pay | Admitting: Obstetrics and Gynecology

## 2022-12-31 LAB — CERVICOVAGINAL ANCILLARY ONLY
Bacterial Vaginitis (gardnerella): NEGATIVE
Candida Glabrata: POSITIVE — AB
Candida Vaginitis: NEGATIVE
Comment: NEGATIVE
Comment: NEGATIVE
Comment: NEGATIVE

## 2022-12-31 LAB — URINE CULTURE: Culture: 10000 — AB

## 2023-01-02 ENCOUNTER — Telehealth (HOSPITAL_COMMUNITY): Payer: Self-pay

## 2023-01-02 MED ORDER — BORIC ACID VAGINAL 600 MG VA SUPP: 600.0000 mg | Freq: Every day | VAGINAL | 0 refills | Status: DC

## 2023-01-02 NOTE — Telephone Encounter (Signed)
Patient had positive results for Candida Glabrata. Pt does have symptoms and would like medication called into [facility] on [address].

## 2023-01-02 NOTE — Telephone Encounter (Signed)
Boric acid vaginal suppository sent to pharmacy as requested.

## 2023-01-03 ENCOUNTER — Telehealth (HOSPITAL_COMMUNITY): Payer: Self-pay

## 2023-01-03 MED ORDER — BORIC ACID VAGINAL 600 MG VA SUPP: 600.0000 mg | Freq: Every day | VAGINAL | 0 refills | Status: DC

## 2023-01-03 NOTE — Telephone Encounter (Signed)
Pt would like her Boric Acid Rx to be called into [facility].

## 2023-01-16 ENCOUNTER — Ambulatory Visit: Payer: BC Managed Care – PPO | Admitting: Primary Care

## 2023-01-16 ENCOUNTER — Encounter: Payer: Self-pay | Admitting: Primary Care

## 2023-01-16 VITALS — BP 126/78 | HR 83 | Temp 97.7°F | Ht 71.0 in | Wt 144.6 lb

## 2023-01-16 DIAGNOSIS — Z720 Tobacco use: Secondary | ICD-10-CM

## 2023-01-16 DIAGNOSIS — R911 Solitary pulmonary nodule: Secondary | ICD-10-CM

## 2023-01-16 MED ORDER — NICOTINE 14 MG/24HR TD PT24: 14.0000 mg | MEDICATED_PATCH | Freq: Every day | TRANSDERMAL | 0 refills | Status: DC

## 2023-01-16 NOTE — Patient Instructions (Addendum)
 -  LUNG NODULE: Your right upper lobe lung nodule remains stable with no significant activity on the PET scan, suggesting it is likely scarring or atelectasis rather than cancer. We will continue to monitor it through the lung cancer screening program and consider a repeat CT scan in 6-12 months.  -RENAL HYPERDENSITY: The hyperdensity in your left kidney has been stable since 2020 and is likely scarring or a chronic complex cyst. Given your recent urinary symptoms, we recommend following up with your primary care provider or gynecologist for a repeat urinalysis after you finish your current treatment. If you continue to have blood in your urine or develop new kidney symptoms, we may consider an MRI of your kidneys.  -CHRONIC OBSTRUCTIVE PULMONARY DISEASE (COPD): Your emphysema is being managed with Trelegy, which is helping with your symptoms. However, continuing to smoke can worsen your condition. We strongly encourage you to quit smoking and can provide resources to help, such as 1-800-QUIT-NOW. We will also attempt to send a prescription for nicotine patches. We will check in again in 6 months to monitor your COPD and weight loss.  -URINARY TRACT INFECTION: You have a bacterial infection in your urinary tract, which is causing blood in your urine. You are currently on a suppository treatment. Please complete this treatment and follow up with your primary care provider or gynecologist for a repeat urinalysis after you finish the treatment.  Follow-up: 6 months with Dr. Tonia Brooms of sooner if needed

## 2023-01-16 NOTE — Progress Notes (Signed)
@Patient  ID: [name], female    DOB: 08/06/1960, 62 y.o.   MRN: [id number]  Chief Complaint  Patient presents with   Follow-up    Referring provider: Ivonne Andrew, NP  HPI: 62 year old, some day smoker.   Previous [facility] pulmonary encounter: 04/30/22 This is a 62 year old female, past medical history of anxiety, hepatitis C, HIV, mother with a history of bone malignancy.  Patient is a current every day smoker for the past 30+ years.  She had a lung cancer screening CT completed that revealed a posterior right upper lobe nodule.  From respiratory standpoint she has no complaints.  She has been actively trying to reduce her smoking.  She has cut down to 8 cigarettes today.  Today we talked about various ways to decrease smoking cessation.  We also gave her some information to help with cost of nicotine replacement patches.   OV 04/30/2022: Here today for follow-up after recent PET scan imaging.Patient had pet imaging that had platelike subpleural consolidation of the right upper lobe with a max SUV of 1.9.  This was favored to be a rounded area of atelectasis and scarring however indolent neoplasm cannot be excluded.  She had no hypermetabolic adenopathy within the chest.  She also found to have a renal cyst that will warrant follow-up imaging.  Patient has no respiratory complaints.  She is looking for a new job.    01/16/2023 Patient presents today for follow-up regarding lung nodule/tobacco abuse.   Discussed the use of AI scribe software for clinical note transcription with the patient, who gave verbal consent to proceed.  History of Present Illness   The patient, a participant in a lung cancer screening program, has been under observation for a right upper lobe lung nodule. Previous PET scans have shown no significant hypermetabolic activity, suggesting a low likelihood of malignancy. The patient underwent a follow-up CT scan in September, which showed stable band-like and  pleural thickening measuring 3.4 by 1.1cm. The scan also revealed hyperdensities in the left kidney, which have been present since 2020 and have not shown substantial progression. These findings are thought to represent scarring or a chronic complex cyst.  The patient reported a recent episode of back pain and suspected urinary tract infection. UA obtain which showed moderate amount of blood in the urine, urine culture positive for staphylococcus. The patient was prescribed a suppository treatment for three weeks for vaginal yeast infection.   The patient has a history of emphysema and is currently taking Trelegy for management of symptoms. She reported a history of severe asthma, which has been less problematic in recent years. However, she noted some recent respiratory symptoms, which she attributed to weather changes. The patient is a current smoker and has been advised to quit.  The patient also reported some unintentional weight loss, which she attributed to stress related to job loss and the holiday season. She was advised to monitor her weight and to seek medical attention if she continues to lose weight despite no changes in diet or eating habits.       Allergies  Allergen Reactions   Aspirin Other (See Comments)    "Heart murmur"     Immunization History  Administered Date(s) Administered   Hepatitis A 03/06/2002   Hepatitis B 01/05/2003, 02/04/2003, 07/01/2003   Influenza Split 12/01/2010, 12/05/2011   Influenza Whole 10/24/2005, 11/07/2007, 12/01/2008, 11/30/2009   Influenza, Seasonal, Injecte, Preservative Fre 11/02/2022   Influenza,inj,Quad PF,6+ Mos 12/31/2012, 09/30/2013, 11/30/2014, 11/14/2015, 12/12/2016, 11/26/2017,  11/10/2018, 10/22/2019, 11/16/2021, 12/14/2021   PFIZER(Purple Top)SARS-COV-2 Vaccination 05/14/2019, 06/09/2019, 12/07/2019   Pfizer(Comirnaty)Fall Seasonal Vaccine 12 years and older 02/15/2022   Pneumococcal Conjugate-13 07/03/2017   Pneumococcal  Polysaccharide-23 03/14/2010, 11/14/2015   Tdap 06/19/2016    Past Medical History:  Diagnosis Date   Anxiety 11/2018   Asthma    Hepatitis C    Has been treated   HIV (human immunodeficiency virus infection) (HCC)    Hypertension    STD (sexually transmitted disease)    Substance abuse (HCC)    Underweight 07/29/2014    Tobacco History: Social History   Tobacco Use  Smoking Status Some Days   Current packs/day: 0.50   Average packs/day: 0.5 packs/day for 33.0 years (16.5 ttl pk-yrs)   Types: Cigarettes   Passive exposure: Never  Smokeless Tobacco Never  Tobacco Comments   Pt smokes 1/2 ppd. AB, CMA 01-16-2023   Ready to quit: No Counseling given: Yes Tobacco comments: Pt smokes 1/2 ppd. AB, CMA 01-16-2023   Outpatient Medications Prior to Visit  Medication Sig Dispense Refill   acetaminophen (TYLENOL) 500 MG tablet Take 1 tablet (500 mg total) by mouth every 6 (six) hours as needed. (Patient taking differently: Take 1,000 mg by mouth every 6 (six) hours as needed for mild pain (pain score 1-3), moderate pain (pain score 4-6) or headache (or abdominal pain).) 30 tablet 0   amLODipine (NORVASC) 5 MG tablet TAKE 1 TABLET(5 MG) BY MOUTH DAILY (Patient taking differently: Take 5 mg by mouth in the morning.) 90 tablet 3   bictegravir-emtricitabine-tenofovir AF (BIKTARVY) 50-200-25 MG TABS tablet Take 1 tablet by mouth daily. 30 tablet 11   Biotin 5000 MCG TABS Take 5,000 mcg by mouth daily.     Boric Acid Vaginal 600 MG SUPP Place 600 mg vaginally at bedtime. 21 suppository 0   buPROPion (WELLBUTRIN SR) 150 MG 12 hr tablet Take 1 tablet (150 mg total) by mouth 2 (two) times daily. Start with once a day for 3 days then increase to twice a day (Patient taking differently: Take 150 mg by mouth in the morning.) 60 tablet 11   cetirizine (ZYRTEC) 10 MG tablet Take 10 mg by mouth daily as needed for allergies or rhinitis.     Cholecalciferol (VITAMIN D3) 1000 units CAPS Take 1,000  Units by mouth daily.     conjugated estrogens (PREMARIN) vaginal cream Place 1 Applicatorful vaginally 2 (two) times a week. Place 0.5g nightly for two weeks then twice a week after (Patient taking differently: Place 0.5 g vaginally See admin instructions. Beginning on 10/26/2022, place 0.5 g vaginally at bedtime for two weeks, then twice a week thereafter) 30 g 5   fluticasone (FLONASE) 50 MCG/ACT nasal spray SHAKE LIQUID AND USE 2 SPRAYS IN EACH NOSTRIL DAILY (Patient taking differently: Place 2 sprays into both nostrils daily as needed for allergies or rhinitis.) 16 g 6   Fluticasone-Umeclidin-Vilant (TRELEGY ELLIPTA) 100-62.5-25 MCG/ACT AEPB INHALE 1 PUFF INTO THE LUNGS EVERY DAY (Patient taking differently: Inhale 1 puff into the lungs daily.) 60 each 6   methocarbamol (ROBAXIN) 500 MG tablet Take 1 tablet (500 mg total) by mouth 2 (two) times daily. 20 tablet 0   methylPREDNISolone (MEDROL DOSEPAK) 4 MG TBPK tablet Take as instructed on the packaging 1 each 0   mirabegron ER (MYRBETRIQ) 25 MG TB24 tablet Take 1 tablet (25 mg total) by mouth daily. 30 tablet 5   mirtazapine (REMERON) 7.5 MG tablet Take 1 tablet (7.5 mg total) by  mouth at bedtime. 90 tablet 3   Multiple Vitamins-Minerals (MULTIVITAMIN GUMMIES ADULTS PO) Take 1 tablet by mouth daily with breakfast.     Omega-3 Fatty Acids (FISH OIL) 1000 MG CAPS Take 1,000 mg by mouth daily.     rosuvastatin (CRESTOR) 5 MG tablet Take 1 tablet (5 mg total) by mouth daily. 30 tablet 11   sodium chloride (OCEAN) 0.65 % SOLN nasal spray Place 1 spray into both nostrils as needed for congestion. 480 mL 3   solifenacin (VESICARE) 10 MG tablet Take 10 mg by mouth daily.     TUMS E-X 750 750 MG chewable tablet Chew 1 tablet by mouth 2 (two) times daily as needed for heartburn.     No facility-administered medications prior to visit.    Review of Systems  Review of Systems  Constitutional:  Positive for unexpected weight change.  Respiratory:  Negative.  Negative for cough, shortness of breath and wheezing.   Genitourinary: Negative.    Physical Exam  BP 126/78 (BP Location: Right Arm, Patient Position: Sitting, Cuff Size: Normal)   Pulse 83   Temp 97.7 F (36.5 C) (Oral)   Ht 5\' 11"  (1.803 m)   Wt 144 lb 9.6 oz (65.6 kg)   LMP 03/27/2011   SpO2 99%   BMI 20.17 kg/m  Physical Exam Constitutional:      General: She is not in acute distress.    Appearance: Normal appearance. She is not ill-appearing.  HENT:     Head: Normocephalic and atraumatic.  Cardiovascular:     Rate and Rhythm: Normal rate and regular rhythm.  Pulmonary:     Effort: Pulmonary effort is normal.     Breath sounds: Normal breath sounds. No wheezing, rhonchi or rales.  Musculoskeletal:        General: Normal range of motion.     Cervical back: Normal range of motion and neck supple.  Lymphadenopathy:     Cervical: No cervical adenopathy.  Skin:    General: Skin is warm and dry.  Neurological:     General: No focal deficit present.     Mental Status: She is alert and oriented to person, place, and time. Mental status is at baseline.  Psychiatric:        Mood and Affect: Mood normal.        Behavior: Behavior normal.        Thought Content: Thought content normal.        Judgment: Judgment normal.      Lab Results:  CBC    Component Value Date/Time   WBC 3.5 (L) 11/09/2022 1259   RBC 4.32 11/09/2022 1259   HGB 12.6 11/09/2022 1259   HGB 13.0 07/20/2019 1150   HCT 40.6 11/09/2022 1259   HCT 41.4 07/20/2019 1150   PLT 208 11/09/2022 1259   PLT 199 07/20/2019 1150   MCV 94.0 11/09/2022 1259   MCV 92 07/20/2019 1150   MCH 29.2 11/09/2022 1259   MCHC 31.0 11/09/2022 1259   RDW 12.3 11/09/2022 1259   RDW 11.9 07/20/2019 1150   LYMPHSABS 1,531 08/28/2022 0414   LYMPHSABS 1.3 07/20/2019 1150   MONOABS 0.7 07/20/2018 1419   EOSABS 101 08/28/2022 0414   EOSABS 0.1 07/20/2019 1150   BASOSABS 9 08/28/2022 0414   BASOSABS 0.0  07/20/2019 1150    BMET    Component Value Date/Time   NA 139 11/09/2022 1259   NA 142 07/20/2019 1150   K 3.8 11/09/2022 1259   CL  105 11/09/2022 1259   CO2 27 11/09/2022 1259   GLUCOSE 108 (H) 11/09/2022 1259   BUN 14 11/09/2022 1259   BUN 14 07/20/2019 1150   CREATININE 1.03 (H) 11/09/2022 1259   CREATININE 1.08 (H) 08/28/2022 0414   CALCIUM 9.1 11/09/2022 1259   GFRNONAA >60 11/09/2022 1259   GFRNONAA 60 05/31/2015 1726   GFRAA 74 07/20/2019 1150   GFRAA 69 05/31/2015 1726    BNP No results found for: "BNP"  ProBNP No results found for: "PROBNP"  Imaging: No results found.   Assessment & Plan:   1. Lung nodule  2. Tobacco use  Lung Nodule Stable right upper lobe lung nodule on CT scan. No significant activity on PET scan, favoring scarring or atelectasis over malignancy. No new symptoms of coughing or hemoptysis. Continue monitoring through lung cancer screening program.  Renal Hyperdensity Stable hyperdensity in left kidney since 2020, likely representing scarring or chronic complex cyst. Recent urinary symptoms and hematuria likely due to urinary tract infection. Follow up with primary care provider or gynecologist for repeat urinalysis after completion of current treatment. Consider MRI of kidneys if persistent hematuria or new renal symptoms.  Chronic Obstructive Pulmonary Disease (COPD) Patient has emphysema and is currently on Trelegy, which is providing symptom relief. Patient continues to smoke. -Encourage smoking cessation and provide resources (1-800-QUIT-NOW). We will send prescription for nicotine patches. FU in 6 months.  Urinary Tract Infection Recent urinary symptoms and hematuria. Positive urine culture for Staphylococcus. Currently on suppository treatment for vaginal yeast infection. Follow up with primary care provider or gynecologist for repeat urinalysis after treatment completion.      Glenford Bayley, NP 01/16/2023

## 2023-02-19 ENCOUNTER — Other Ambulatory Visit: Payer: Self-pay | Admitting: *Deleted

## 2023-02-19 DIAGNOSIS — Z87891 Personal history of nicotine dependence: Secondary | ICD-10-CM

## 2023-02-19 DIAGNOSIS — Z122 Encounter for screening for malignant neoplasm of respiratory organs: Secondary | ICD-10-CM

## 2023-02-20 ENCOUNTER — Other Ambulatory Visit: Payer: Self-pay

## 2023-02-20 DIAGNOSIS — Z79899 Other long term (current) drug therapy: Secondary | ICD-10-CM

## 2023-02-20 DIAGNOSIS — B20 Human immunodeficiency virus [HIV] disease: Secondary | ICD-10-CM

## 2023-02-21 ENCOUNTER — Other Ambulatory Visit: Payer: Self-pay | Admitting: Internal Medicine

## 2023-02-21 ENCOUNTER — Other Ambulatory Visit: Payer: Self-pay

## 2023-02-21 ENCOUNTER — Other Ambulatory Visit: Payer: BC Managed Care – PPO

## 2023-02-21 DIAGNOSIS — B20 Human immunodeficiency virus [HIV] disease: Secondary | ICD-10-CM

## 2023-02-21 DIAGNOSIS — Z79899 Other long term (current) drug therapy: Secondary | ICD-10-CM

## 2023-02-21 NOTE — Addendum Note (Signed)
 Addended by: Harley Alto on: 02/21/2023 10:15 AM   Modules accepted: Orders

## 2023-02-24 ENCOUNTER — Other Ambulatory Visit: Payer: Self-pay | Admitting: Internal Medicine

## 2023-02-24 LAB — LIPID PANEL
Cholesterol: 158 mg/dL
HDL: 85 mg/dL
LDL Cholesterol (Calc): 60 mg/dL
Non-HDL Cholesterol (Calc): 73 mg/dL
Total CHOL/HDL Ratio: 1.9 (calc)
Triglycerides: 54 mg/dL

## 2023-02-24 LAB — COMPLETE METABOLIC PANEL WITH GFR
AG Ratio: 1.4 (calc) (ref 1.0–2.5)
ALT: 12 U/L (ref 6–29)
AST: 21 U/L (ref 10–35)
Albumin: 4.9 g/dL (ref 3.6–5.1)
Alkaline phosphatase (APISO): 130 U/L (ref 37–153)
BUN/Creatinine Ratio: 13 (calc) (ref 6–22)
BUN: 15 mg/dL (ref 7–25)
CO2: 27 mmol/L (ref 20–32)
Calcium: 9.9 mg/dL (ref 8.6–10.4)
Chloride: 103 mmol/L (ref 98–110)
Creat: 1.18 mg/dL — ABNORMAL HIGH (ref 0.50–1.05)
Globulin: 3.4 g/dL (ref 1.9–3.7)
Glucose, Bld: 73 mg/dL (ref 65–99)
Potassium: 3.9 mmol/L (ref 3.5–5.3)
Sodium: 140 mmol/L (ref 135–146)
Total Bilirubin: 0.5 mg/dL (ref 0.2–1.2)
Total Protein: 8.3 g/dL — ABNORMAL HIGH (ref 6.1–8.1)
eGFR: 52 mL/min/{1.73_m2} — ABNORMAL LOW (ref >=60)

## 2023-02-24 LAB — CBC WITH DIFFERENTIAL/PLATELET
Absolute Lymphocytes: 1526 {cells}/uL (ref 850–3900)
Absolute Monocytes: 418 {cells}/uL (ref 200–950)
Basophils Absolute: 11 {cells}/uL (ref 0–200)
Basophils Relative: 0.3 %
Eosinophils Absolute: 61 {cells}/uL (ref 15–500)
Eosinophils Relative: 1.7 %
HCT: 37.9 % (ref 35.0–45.0)
Hemoglobin: 12.4 g/dL (ref 11.7–15.5)
MCH: 30.8 pg (ref 27.0–33.0)
MCHC: 32.7 g/dL (ref 32.0–36.0)
MCV: 94 fL (ref 80.0–100.0)
MPV: 10.8 fL (ref 7.5–12.5)
Monocytes Relative: 11.6 %
Neutro Abs: 1584 {cells}/uL (ref 1500–7800)
Neutrophils Relative %: 44 %
Platelets: 183 Thousand/uL (ref 140–400)
RBC: 4.03 Million/uL (ref 3.80–5.10)
RDW: 11.8 % (ref 11.0–15.0)
Total Lymphocyte: 42.4 %
WBC: 3.6 Thousand/uL — ABNORMAL LOW (ref 3.8–10.8)

## 2023-02-24 LAB — HIV-1 RNA QUANT-NO REFLEX-BLD
HIV 1 RNA Quant: NOT DETECTED {copies}/mL
HIV-1 RNA Quant, Log: NOT DETECTED {Log_copies}/mL

## 2023-02-24 LAB — T-HELPER CELLS (CD4) COUNT (NOT AT ARMC)
Absolute CD4: 486 {cells}/uL — ABNORMAL LOW (ref 490–1740)
CD4 T Helper %: 31 % (ref 30–61)
Total lymphocyte count: 1581 {cells}/uL (ref 850–3900)

## 2023-03-07 ENCOUNTER — Ambulatory Visit: Payer: BC Managed Care – PPO | Admitting: Internal Medicine

## 2023-03-07 ENCOUNTER — Other Ambulatory Visit: Payer: Self-pay

## 2023-03-07 ENCOUNTER — Encounter: Payer: Self-pay | Admitting: Internal Medicine

## 2023-03-07 VITALS — BP 138/88 | HR 86 | Temp 97.5°F | Resp 16 | Wt 143.0 lb

## 2023-03-07 DIAGNOSIS — B20 Human immunodeficiency virus [HIV] disease: Secondary | ICD-10-CM | POA: Diagnosis not present

## 2023-03-07 DIAGNOSIS — H1013 Acute atopic conjunctivitis, bilateral: Secondary | ICD-10-CM

## 2023-03-07 DIAGNOSIS — B1920 Unspecified viral hepatitis C without hepatic coma: Secondary | ICD-10-CM | POA: Diagnosis not present

## 2023-03-07 DIAGNOSIS — Z79899 Other long term (current) drug therapy: Secondary | ICD-10-CM

## 2023-03-07 DIAGNOSIS — R634 Abnormal weight loss: Secondary | ICD-10-CM

## 2023-03-07 DIAGNOSIS — E440 Moderate protein-calorie malnutrition: Secondary | ICD-10-CM | POA: Diagnosis not present

## 2023-03-07 DIAGNOSIS — B182 Chronic viral hepatitis C: Secondary | ICD-10-CM

## 2023-03-07 MED ORDER — BIKTARVY 50-200-25 MG PO TABS: 1.0000 | ORAL_TABLET | Freq: Every day | ORAL | 11 refills | Status: DC

## 2023-03-07 NOTE — Progress Notes (Signed)
RFV: follow up for hiv disease  Patient ID: [name], female   DOB: 08-30-1960, 63 y.o.   MRN: [id number]  HPI  The patient, with a history of of well controlled hiv disease on biktarvy, treated Hepatitis C, and  COPD, presents for a routine follow-up. They report no issues with their current HIV medication, Biktarvy, and have not missed any doses. Recent lab work shows a CD4 count of 486 and an undetectable viral load, indicating effective management of their HIV. Kidney function is also reported as good.  The patient has a history of Hepatitis C treatment, and although they have not had recent abdominal imaging, they agree to schedule an ultrasound of the liver for monitoring purposes. They have been using their Trelegy inhaler for respiratory issues without any reported problems.  The patient recently experienced significant back pain, initially suspected to be a yeast infection or kidney stone. However, after receiving ineffective treatment from their primary care provider, they sought care at an urgent care center where they were prescribed a boric acid pill for bacterial vaginitis, which resolved their symptoms.  The patient also reports issues with a prolapsed bladder, which they have been unable to address due to financial constraints. They express concern that this may be contributing to their recent bacterial vaginitis due to potential bacterial transfer during personal hygiene practices.  The patient has also been experiencing burning sensations in their eyes, which they have been managing with over-the-counter eye drops.  In terms of dietary habits, the patient reports eating twice a day, but agrees to increase this to three times a day to address concerns about their weight. They are up-to-date with their flu vaccine and are due for a mammogram.  Outpatient Encounter Medications as of 03/07/2023  Medication Sig   acetaminophen (TYLENOL) 500 MG tablet Take 1 tablet (500  mg total) by mouth every 6 (six) hours as needed. (Patient taking differently: Take 1,000 mg by mouth every 6 (six) hours as needed for mild pain (pain score 1-3), moderate pain (pain score 4-6) or headache (or abdominal pain).)   amLODipine (NORVASC) 5 MG tablet TAKE 1 TABLET(5 MG) BY MOUTH DAILY (Patient taking differently: Take 5 mg by mouth in the morning.)   bictegravir-emtricitabine-tenofovir AF (BIKTARVY) 50-200-25 MG TABS tablet Take 1 tablet by mouth daily.   Biotin 5000 MCG TABS Take 5,000 mcg by mouth daily.   Boric Acid Vaginal 600 MG SUPP Place 600 mg vaginally at bedtime.   cetirizine (ZYRTEC) 10 MG tablet Take 10 mg by mouth daily as needed for allergies or rhinitis.   Cholecalciferol (VITAMIN D3) 1000 units CAPS Take 1,000 Units by mouth daily.   conjugated estrogens (PREMARIN) vaginal cream Place 1 Applicatorful vaginally 2 (two) times a week. Place 0.5g nightly for two weeks then twice a week after (Patient taking differently: Place 0.5 g vaginally See admin instructions. Beginning on 10/26/2022, place 0.5 g vaginally at bedtime for two weeks, then twice a week thereafter)   fluticasone (FLONASE) 50 MCG/ACT nasal spray SHAKE LIQUID AND USE 2 SPRAYS IN EACH NOSTRIL DAILY (Patient taking differently: Place 2 sprays into both nostrils daily as needed for allergies or rhinitis.)   Fluticasone-Umeclidin-Vilant (TRELEGY ELLIPTA) 100-62.5-25 MCG/ACT AEPB INHALE 1 PUFF INTO THE LUNGS EVERY DAY (Patient taking differently: Inhale 1 puff into the lungs daily.)   methocarbamol (ROBAXIN) 500 MG tablet Take 1 tablet (500 mg total) by mouth 2 (two) times daily.   mirabegron ER (MYRBETRIQ) 25 MG TB24 tablet Take 1  tablet (25 mg total) by mouth daily.   mirtazapine (REMERON) 7.5 MG tablet Take 1 tablet (7.5 mg total) by mouth at bedtime.   Multiple Vitamins-Minerals (MULTIVITAMIN GUMMIES ADULTS PO) Take 1 tablet by mouth daily with breakfast.   Omega-3 Fatty Acids (FISH OIL) 1000 MG CAPS Take 1,000  mg by mouth daily.   rosuvastatin (CRESTOR) 5 MG tablet TAKE 1 TABLET(5 MG) BY MOUTH DAILY   sodium chloride (OCEAN) 0.65 % SOLN nasal spray Place 1 spray into both nostrils as needed for congestion.   solifenacin (VESICARE) 10 MG tablet Take 10 mg by mouth daily.   TUMS E-X 750 750 MG chewable tablet Chew 1 tablet by mouth 2 (two) times daily as needed for heartburn.   buPROPion (WELLBUTRIN SR) 150 MG 12 hr tablet Take 1 tablet (150 mg total) by mouth 2 (two) times daily. Start with once a day for 3 days then increase to twice a day (Patient not taking: Reported on 03/07/2023)   methylPREDNISolone (MEDROL DOSEPAK) 4 MG TBPK tablet Take as instructed on the packaging (Patient not taking: Reported on 03/07/2023)   nicotine (NICODERM CQ - DOSED IN MG/24 HOURS) 14 mg/24hr patch Place 1 patch (14 mg total) onto the skin daily. (Patient not taking: Reported on 03/07/2023)   No facility-administered encounter medications on file as of 03/07/2023.     Patient Active Problem List   Diagnosis Date Noted   Acute low back pain without sciatica 12/19/2022   Chest pain 11/08/2022   Abnormal EKG 11/08/2022   Screening for cervical cancer 04/16/2022   Weight loss 04/16/2022   Nonintractable headache 08/10/2021   Cystocele with prolapse 07/21/2020   Callus of foot 12/12/2016   Multiple renal cysts 08/14/2016   Constipation 11/30/2014   Essential hypertension 11/30/2014   Hot flashes, menopausal 01/27/2014   Asthma 01/23/2011   HEADACHE 04/14/2008   CHOLELITHIASIS 12/15/2007   PERIPHERAL NEUROPATHY 11/07/2007   Avulsion fracture of tooth 07/23/2007   Depression 07/24/2006   Human immunodeficiency virus (HIV) disease (HCC) 12/14/2005   MACROCYTIC ANEMIA 12/14/2005   Tobacco use 12/14/2005   SUBSTANCE ABUSE 12/14/2005   GERD 12/14/2005   PNEUMONIA, HX OF 12/14/2005   COLPOSCOPY, HX OF 12/14/2005     Health Maintenance Due  Topic Date Due   Zoster Vaccines- Shingrix (1 of 2) Never done    COVID-19 Vaccine (5 - 2024-25 season) 10/14/2022   Cervical Cancer Screening (Pap smear)  04/16/2023     Review of Systems 12 point ros reviewed, also includes + allergic eyes Physical Exam   BP 138/88   Pulse 86   Temp (!) 97.5 F (36.4 C) (Temporal)   Resp 16   Wt 143 lb (64.9 kg)   LMP 03/27/2011   SpO2 99%   BMI 19.94 kg/m   Physical Exam  Constitutional:  oriented to person, place, and time. appears well-developed and well-nourished. No distress.  HENT: [place]/AT, PERRLA, no scleral icterus Mouth/Throat: Oropharynx is clear and moist. No oropharyngeal exudate.  Cardiovascular: Normal rate, regular rhythm and normal heart sounds. Exam reveals no gallop and no friction rub.  No murmur heard.  Pulmonary/Chest: Effort normal and breath sounds normal. No respiratory distress.  has no wheezes.  Neck = supple, no nuchal rigidity Lymphadenopathy: no cervical adenopathy. No axillary adenopathy Neurological: alert and oriented to person, place, and time.  Skin: Skin is warm and dry. No rash noted. No erythema.  Psychiatric: a normal mood and affect.  behavior is normal.   Lab Results  Component Value Date   CD4TCELL 31 02/21/2023   Lab Results  Component Value Date   CD4TABS 415 08/28/2022   CD4TABS 356 (L) 02/15/2022   CD4TABS 413 11/02/2021   Lab Results  Component Value Date   HIV1RNAQUANT Not Detected 02/21/2023   Lab Results  Component Value Date   HEPBSAB YES 04/08/2006   Lab Results  Component Value Date   LABRPR REACTIVE (A) 08/28/2022    CBC Lab Results  Component Value Date   WBC 3.6 (L) 02/21/2023   RBC 4.03 02/21/2023   HGB 12.4 02/21/2023   HCT 37.9 02/21/2023   PLT 183 02/21/2023   MCV 94.0 02/21/2023   MCH 30.8 02/21/2023   MCHC 32.7 02/21/2023   RDW 11.8 02/21/2023   LYMPHSABS 1,531 08/28/2022   MONOABS 0.7 07/20/2018   EOSABS 61 02/21/2023    BMET Lab Results  Component Value Date   NA 140 02/21/2023   K 3.9 02/21/2023   CL 103  02/21/2023   CO2 27 02/21/2023   GLUCOSE 73 02/21/2023   BUN 15 02/21/2023   CREATININE 1.18 (H) 02/21/2023   CALCIUM 9.9 02/21/2023   GFRNONAA >60 11/09/2022   GFRAA 74 07/20/2019    LABS CD4 count: 486 cells/L (02/21/2023) HIV viral load: undetectable (02/21/2023)  Assessment and Plan  HIV HIV follow-up. CD4 count is 486, improved from summer. Viral load undetectable. Kidney function normal. No issues with current medication (Biktarvy). - Continue Biktarvy, gave refills - Schedule follow-up in six months  Hepatitis C (post-treatment) Treated Hepatitis C. No recent abdominal imaging. Need to monitor for liver changes due to risk of liver cancer. Explained ongoing risk of liver cancer despite treatment, necessitating regular liver ultrasounds. - Order limited ultrasound of the liver - Schedule liver ultrasound once or twice a year  Chronic Obstructive Pulmonary Disease (COPD) and hx of pulmonary nodule Using Trelegy inhaler. No current issues with breathing. - Continue Trelegy inhaler - we will follow up with pulmonary to see when next imaging for surveillance  Allergic conjunctivitis Burning sensation in eyes. Using eye drops intermittently. Possible dry eyes or allergies. Advised to continue using eye drops and take Zyrtec or Claritin daily. Consider referral to an eye doctor if symptoms persist. - Continue using eye drops - Take Zyrtec or Claritin daily - Consider referral to an eye doctor if symptoms persist  General Health Maintenance Up to date on flu vaccine. Needs to increase food intake to three meals a day. Due for a mammogram. - Increase food intake to three meals a day - Schedule mammogram  Moderate protein calorie malnutirtion Increase caloric intake to 3 meals per day  Follow-up - Schedule follow-up visit in six months.

## 2023-03-13 ENCOUNTER — Inpatient Hospital Stay: Admission: RE | Admit: 2023-03-13 | Payer: BC Managed Care – PPO | Source: Ambulatory Visit

## 2023-03-18 ENCOUNTER — Ambulatory Visit
Admission: RE | Admit: 2023-03-18 | Discharge: 2023-03-18 | Disposition: A | Discharge status: Home / Self Care | Payer: BC Managed Care – PPO | Source: Ambulatory Visit | Attending: Internal Medicine | Admitting: Internal Medicine

## 2023-03-18 DIAGNOSIS — B182 Chronic viral hepatitis C: Secondary | ICD-10-CM

## 2023-04-02 ENCOUNTER — Other Ambulatory Visit: Payer: Self-pay | Admitting: Nurse Practitioner

## 2023-04-02 DIAGNOSIS — Z1231 Encounter for screening mammogram for malignant neoplasm of breast: Secondary | ICD-10-CM

## 2023-04-09 ENCOUNTER — Other Ambulatory Visit: Payer: Self-pay | Admitting: Internal Medicine

## 2023-04-10 ENCOUNTER — Other Ambulatory Visit: Payer: Self-pay | Admitting: Internal Medicine

## 2023-04-10 DIAGNOSIS — B20 Human immunodeficiency virus [HIV] disease: Secondary | ICD-10-CM

## 2023-04-22 ENCOUNTER — Other Ambulatory Visit: Payer: Self-pay | Admitting: Nurse Practitioner

## 2023-04-22 MED ORDER — ROSUVASTATIN CALCIUM 5 MG PO TABS: 5.0000 mg | ORAL_TABLET | Freq: Every day | ORAL | 11 refills | Status: AC

## 2023-04-22 NOTE — Telephone Encounter (Signed)
 Copied from CRM [id number]. Topic: Clinical - Medication Question >> Apr 22, 2023  3:16 PM Marland Kitchen D wrote: Patient is wanting to get her prescription- rosuvastatin (CRESTOR) 5 MG tablet refilled she contacted the pharmacy and was told she can't get it until May 04, 2023. She is out of the medication. She would like to be contacted through MyChart today regarding this.

## 2023-04-24 ENCOUNTER — Ambulatory Visit: Payer: BC Managed Care – PPO

## 2023-04-26 ENCOUNTER — Ambulatory Visit (HOSPITAL_COMMUNITY): Admission: EM | Admit: 2023-04-26 | Discharge: 2023-04-26 | Disposition: A | Discharge status: Home / Self Care

## 2023-04-26 ENCOUNTER — Encounter (HOSPITAL_COMMUNITY): Payer: Self-pay

## 2023-04-26 DIAGNOSIS — R051 Acute cough: Secondary | ICD-10-CM | POA: Diagnosis not present

## 2023-04-26 DIAGNOSIS — K5900 Constipation, unspecified: Secondary | ICD-10-CM

## 2023-04-26 DIAGNOSIS — J069 Acute upper respiratory infection, unspecified: Secondary | ICD-10-CM | POA: Diagnosis not present

## 2023-04-26 MED ORDER — BENZONATATE 100 MG PO CAPS: 100.0000 mg | ORAL_CAPSULE | Freq: Three times a day (TID) | ORAL | 0 refills | Status: AC

## 2023-04-26 MED ORDER — AZITHROMYCIN 250 MG PO TABS: ORAL_TABLET | ORAL | 0 refills | Status: DC

## 2023-04-26 NOTE — ED Triage Notes (Signed)
 Patient reports that she has nasal congestion, a productive cough with green sputum, abdominal pain, chills, and a headache x 4 days.  Patient reports that she has been taking Tylenol, flonase, and Night time cold and flu .

## 2023-04-26 NOTE — ED Provider Notes (Signed)
 [facility]    CSN: [id number] Arrival date & time: 04/26/23  4098      History   Chief Complaint Chief Complaint  Patient presents with   Nasal Congestion   Cough   Abdominal Pain   Headache   Chills    HPI [name] is a 63 y.o. female.   Patient presents with nasal congestion, productive cough with green sputum, chills, headache, and sore throat x 4 days.  Denies shortness of breath, chest pain, known fever.  Patient also presents with lower abdominal pain x 2 days.  Patient states she has not had a bowel movement since 3/12.  Denies nausea, vomiting, and diarrhea.  History of HIV and reports taking Biktarvy as prescribed.     Cough Associated symptoms: headaches   Abdominal Pain Associated symptoms: cough   Headache Associated symptoms: abdominal pain and cough     Past Medical History:  Diagnosis Date   Anxiety 11/2018   Asthma    Hepatitis C    Has been treated   HIV (human immunodeficiency virus infection) (HCC)    Hypertension    STD (sexually transmitted disease)    Substance abuse (HCC)    Underweight 07/29/2014    Patient Active Problem List   Diagnosis Date Noted   Acute low back pain without sciatica 12/19/2022   Chest pain 11/08/2022   Abnormal EKG 11/08/2022   Screening for cervical cancer 04/16/2022   Weight loss 04/16/2022   Nonintractable headache 08/10/2021   Cystocele with prolapse 07/21/2020   Callus of foot 12/12/2016   Multiple renal cysts 08/14/2016   Constipation 11/30/2014   Essential hypertension 11/30/2014   Hot flashes, menopausal 01/27/2014   Asthma 01/23/2011   HEADACHE 04/14/2008   CHOLELITHIASIS 12/15/2007   PERIPHERAL NEUROPATHY 11/07/2007   Avulsion fracture of tooth 07/23/2007   Depression 07/24/2006   Human immunodeficiency virus (HIV) disease (HCC) 12/14/2005   MACROCYTIC ANEMIA 12/14/2005   Tobacco use 12/14/2005   SUBSTANCE ABUSE 12/14/2005   GERD 12/14/2005   PNEUMONIA, HX OF  12/14/2005   COLPOSCOPY, HX OF 12/14/2005    Past Surgical History:  Procedure Laterality Date   BUNIONECTOMY Left 03/15/2020   COLONOSCOPY WITH PROPOFOL N/A 06/26/2016   Procedure: COLONOSCOPY WITH PROPOFOL;  Surgeon: Kathi Der, MD;  Location: [facility];  Service: Gastroenterology;  Laterality: N/A;   fractured wrist Left    LAPAROSCOPIC APPENDECTOMY N/A 07/20/2018   Procedure: APPENDECTOMY LAPAROSCOPIC;  Surgeon: Berna Bue, MD;  Location: [facility];  Service: General;  Laterality: N/A;   PANCREATECTOMY      OB History     Gravida  4   Para  2   Term  2   Preterm      AB  2   Living  2      SAB      IAB  2   Ectopic      Multiple      Live Births  2            Home Medications    Prior to Admission medications   Medication Sig Start Date End Date Taking? Authorizing Provider  amLODipine (NORVASC) 5 MG tablet Take 5 mg by mouth daily.   Yes [provider]  azithromycin (ZITHROMAX Z-PAK) 250 MG tablet Take 2 pills (500mg ) first day and one pill (250mg ) the remaining 4 days. 04/26/23  Yes Susann Givens, Aundraya Dripps A, NP  benzonatate (TESSALON) 100 MG capsule Take 1 capsule (100 mg total)  by mouth every 8 (eight) hours. 04/26/23  Yes Wynonia Lawman A, NP  acetaminophen (TYLENOL) 500 MG tablet Take 1 tablet (500 mg total) by mouth every 6 (six) hours as needed. Patient taking differently: Take 1,000 mg by mouth every 6 (six) hours as needed for mild pain (pain score 1-3), moderate pain (pain score 4-6) or headache (or abdominal pain). 02/20/18   Dahlia Byes A, FNP  amLODipine (NORVASC) 5 MG tablet TAKE 1 TABLET(5 MG) BY MOUTH DAILY Patient taking differently: Take 5 mg by mouth in the morning. 04/27/22   Ivonne Andrew, NP  bictegravir-emtricitabine-tenofovir AF (BIKTARVY) 50-200-25 MG TABS tablet Take 1 tablet by mouth daily. 03/07/23   Judyann Munson, MD  Biotin 5000 MCG TABS Take 5,000 mcg by mouth daily.    [provider]  Boric  Acid Vaginal 600 MG SUPP Place 600 mg vaginally at bedtime. 01/03/23   Raspet, Noberto Retort, PA-C  buPROPion (WELLBUTRIN SR) 150 MG 12 hr tablet Take 1 tablet (150 mg total) by mouth 2 (two) times daily. Start with once a day for 3 days then increase to twice a day Patient not taking: Reported on 03/07/2023 08/28/22   Judyann Munson, MD  cetirizine (ZYRTEC) 10 MG tablet Take 10 mg by mouth daily as needed for allergies or rhinitis.    [provider]  conjugated estrogens (PREMARIN) vaginal cream Place 1 Applicatorful vaginally 2 (two) times a week. Place 0.5g nightly for two weeks then twice a week after Patient taking differently: Place 0.5 g vaginally See admin instructions. Beginning on 10/26/2022, place 0.5 g vaginally at bedtime for two weeks, then twice a week thereafter 10/25/22   Selmer Dominion, NP  fluticasone (FLONASE) 50 MCG/ACT nasal spray SHAKE LIQUID AND USE 2 SPRAYS IN EACH NOSTRIL DAILY Patient taking differently: Place 2 sprays into both nostrils daily as needed for allergies or rhinitis. 10/22/22   Ivonne Andrew, NP  Fluticasone-Umeclidin-Vilant (TRELEGY ELLIPTA) 100-62.5-25 MCG/ACT AEPB INHALE 1 PUFF INTO THE LUNGS EVERY DAY Patient taking differently: Inhale 1 puff into the lungs daily. 11/01/22   Icard, Rachel Bo, DO  methylPREDNISolone (MEDROL DOSEPAK) 4 MG TBPK tablet Take as instructed on the packaging Patient not taking: Reported on 03/07/2023 12/19/22   Donell Beers, FNP  mirabegron ER (MYRBETRIQ) 25 MG TB24 tablet Take 1 tablet (25 mg total) by mouth daily. 10/25/22   Selmer Dominion, NP  Multiple Vitamins-Minerals (MULTIVITAMIN GUMMIES ADULTS PO) Take 1 tablet by mouth daily with breakfast.    [provider]  nicotine (NICODERM CQ - DOSED IN MG/24 HOURS) 14 mg/24hr patch Place 1 patch (14 mg total) onto the skin daily. Patient not taking: Reported on 03/07/2023 01/16/23   Glenford Bayley, NP  Omega-3 Fatty Acids (FISH OIL) 1000 MG CAPS Take 1,000 mg  by mouth daily.    [provider]  rosuvastatin (CRESTOR) 5 MG tablet Take 1 tablet (5 mg total) by mouth daily. 04/22/23   Ivonne Andrew, NP  solifenacin (VESICARE) 10 MG tablet Take 10 mg by mouth daily.    [provider]  TUMS E-X 750 750 MG chewable tablet Chew 1 tablet by mouth 2 (two) times daily as needed for heartburn.    [provider]    Family History Family History  Problem Relation Age of Onset   Cancer Mother        bone   Diabetes Sister    Colon cancer Maternal Uncle    Esophageal cancer Neg  Hx    Gastric cancer Neg Hx    Pancreatic cancer Neg Hx    Liver disease Neg Hx    Inflammatory bowel disease Neg Hx     Social History Social History   Tobacco Use   Smoking status: Some Days    Current packs/day: 0.50    Average packs/day: 0.5 packs/day for 33.0 years (16.5 ttl pk-yrs)    Types: Cigarettes    Passive exposure: Never   Smokeless tobacco: Never   Tobacco comments:    Pt smokes 1/2 ppd. AB, CMA 01-16-2023  Vaping Use   Vaping status: Never Used  Substance Use Topics   Alcohol use: Yes    Alcohol/week: 5.0 standard drinks of alcohol    Types: 5 Cans of beer per week   Drug use: No    Comment: clean for 10 years     Allergies   Aspirin   Review of Systems Review of Systems  Respiratory:  Positive for cough.   Gastrointestinal:  Positive for abdominal pain.  Neurological:  Positive for headaches.   Per HPI  Physical Exam Triage Vital Signs ED Triage Vitals [04/26/23 0851]  Encounter Vitals Group     BP      Systolic BP Percentile      Diastolic BP Percentile      Pulse      Resp      Temp      Temp src      SpO2      Weight      Height      Head Circumference      Peak Flow      Pain Score 8     Pain Loc      Pain Education      Exclude from Growth Chart    No data found.  Updated Vital Signs BP 108/73 (BP Location: Left Arm)   Pulse 91   Temp 99.2 F (37.3 C) (Oral)   Resp 14   LMP  03/27/2011   SpO2 97%   Visual Acuity Right Eye Distance:   Left Eye Distance:   Bilateral Distance:    Right Eye Near:   Left Eye Near:    Bilateral Near:     Physical Exam Vitals and nursing note reviewed.  Constitutional:      General: She is awake. She is not in acute distress.    Appearance: Normal appearance. She is well-developed and well-groomed. She is not ill-appearing.  HENT:     Right Ear: Tympanic membrane, ear canal and external ear normal.     Left Ear: Tympanic membrane, ear canal and external ear normal.     Nose: Congestion and rhinorrhea present.     Mouth/Throat:     Mouth: Mucous membranes are moist.     Pharynx: Posterior oropharyngeal erythema present. No oropharyngeal exudate.  Cardiovascular:     Rate and Rhythm: Normal rate and regular rhythm.  Pulmonary:     Effort: Pulmonary effort is normal.     Breath sounds: Normal breath sounds.  Abdominal:     General: Abdomen is flat. Bowel sounds are normal. There is no distension.     Palpations: Abdomen is soft.     Tenderness: There is abdominal tenderness in the periumbilical area and left lower quadrant.  Skin:    General: Skin is warm and dry.  Neurological:     Mental Status: She is alert.  Psychiatric:        Behavior:  Behavior is cooperative.      UC Treatments / Results  Labs (all labs ordered are listed, but only abnormal results are displayed) Labs Reviewed - No data to display  EKG   Radiology No results found.  Procedures Procedures (including critical care time)  Medications Ordered in UC Medications - No data to display  Initial Impression / Assessment and Plan / UC Course  I have reviewed the triage vital signs and the nursing notes.  Pertinent labs & imaging results that were available during my care of the patient were reviewed by me and considered in my medical decision making (see chart for details).     Upon assessment congestion and rhinorrhea present, mild  erythema noted to pharynx.  Lungs clear bilateral auscultation.  Mild tenderness noted to periumbilical and left lower quadrant likely related to constipation.  Prescribed azithromycin for upper respiratory infection.  Prescribed Tessalon as needed for cough.  Discussed appropriate use of MiraLAX to assist with constipation.  Discussed return precautions. Final Clinical Impressions(s) / UC Diagnoses   Final diagnoses:  Acute upper respiratory infection  Acute cough  Constipation, unspecified constipation type     Discharge Instructions      Start taking azithromycin by taking 2 tablets today and 1 tablet on the remaining 4 days.  I have prescribed Tessalon that you can take every 8 hours as needed for cough.  Otherwise alternate between Tylenol and ibuprofen as needed for pain and fever.  I also recommend taking Mucinex to help with cough and congestion.   You can take MiraLAX once daily to help with constipation.  Be sure to drink lots of water as this will help MiraLAX be most effective.  Return here if symptoms persist or worsen.      ED Prescriptions     Medication Sig Dispense Auth. Provider   benzonatate (TESSALON) 100 MG capsule Take 1 capsule (100 mg total) by mouth every 8 (eight) hours. 21 capsule Susann Givens, Toluwani Ruder A, NP   azithromycin (ZITHROMAX Z-PAK) 250 MG tablet Take 2 pills (500mg ) first day and one pill (250mg ) the remaining 4 days. 6 tablet Wynonia Lawman A, NP      PDMP not reviewed this encounter.   Wynonia Lawman A, NP 04/26/23 [phone]

## 2023-04-26 NOTE — Discharge Instructions (Addendum)
 Start taking azithromycin by taking 2 tablets today and 1 tablet on the remaining 4 days.  I have prescribed Tessalon that you can take every 8 hours as needed for cough.  Otherwise alternate between Tylenol and ibuprofen as needed for pain and fever.  I also recommend taking Mucinex to help with cough and congestion.   You can take MiraLAX once daily to help with constipation.  Be sure to drink lots of water as this will help MiraLAX be most effective.  Return here if symptoms persist or worsen.

## 2023-05-02 ENCOUNTER — Ambulatory Visit: Payer: Self-pay | Admitting: Nurse Practitioner

## 2023-05-03 ENCOUNTER — Ambulatory Visit
Admission: RE | Admit: 2023-05-03 | Discharge: 2023-05-03 | Disposition: A | Discharge status: Home / Self Care | Source: Ambulatory Visit | Attending: Nurse Practitioner | Admitting: Nurse Practitioner

## 2023-05-03 DIAGNOSIS — Z1231 Encounter for screening mammogram for malignant neoplasm of breast: Secondary | ICD-10-CM

## 2023-05-17 ENCOUNTER — Encounter: Payer: Self-pay | Admitting: Nurse Practitioner

## 2023-05-17 ENCOUNTER — Ambulatory Visit (INDEPENDENT_AMBULATORY_CARE_PROVIDER_SITE_OTHER): Payer: Self-pay | Admitting: Nurse Practitioner

## 2023-05-17 DIAGNOSIS — I10 Essential (primary) hypertension: Secondary | ICD-10-CM | POA: Diagnosis not present

## 2023-05-17 MED ORDER — AMLODIPINE BESYLATE 5 MG PO TABS: ORAL_TABLET | ORAL | 3 refills | Status: DC

## 2023-05-17 MED ORDER — TRELEGY ELLIPTA 100-62.5-25 MCG/ACT IN AEPB: 1.0000 | INHALATION_SPRAY | Freq: Every day | RESPIRATORY_TRACT | 11 refills | Status: AC

## 2023-05-17 MED ORDER — OMEPRAZOLE 20 MG PO CPDR: 20.0000 mg | DELAYED_RELEASE_CAPSULE | Freq: Every day | ORAL | 3 refills | Status: DC

## 2023-05-17 MED ORDER — NICOTINE 14 MG/24HR TD PT24: 14.0000 mg | MEDICATED_PATCH | Freq: Every day | TRANSDERMAL | 0 refills | Status: DC

## 2023-05-17 NOTE — Progress Notes (Signed)
 Subjective   Patient ID: [name], female    DOB: 26-Nov-1960, 62 y.o.   MRN: [id number]  Chief Complaint  Patient presents with   Medical Management of Chronic Issues    Referring provider: Ivonne Andrew, NP  [name] is a 63 y.o. female with Past Medical History: 11/2018: Anxiety No date: Asthma No date: Hepatitis C     Comment:  Has been treated No date: HIV (human immunodeficiency virus infection) (HCC) No date: Hypertension No date: STD (sexually transmitted disease) No date: Substance abuse (HCC) 07/29/2014: Underweight   HPI   Patient presents today for follow-up visit.  Overall she has been doing well since her last visit here.  She is due for a Pap smear.  We will schedule a follow-up appointment to have this completed as well as her lab work.  She does need refills today. Denies f/c/s, n/v/d, hemoptysis, PND, leg swelling Denies chest pain or edema     Allergies  Allergen Reactions   Aspirin Other (See Comments)    "Heart murmur"     Immunization History  Administered Date(s) Administered   Hepatitis A 03/06/2002   Hepatitis B 01/05/2003, 02/04/2003, 07/01/2003   Influenza Split 12/01/2010, 12/05/2011   Influenza Whole 10/24/2005, 11/07/2007, 12/01/2008, 11/30/2009   Influenza, Seasonal, Injecte, Preservative Fre 11/02/2022   Influenza,inj,Quad PF,6+ Mos 12/31/2012, 09/30/2013, 11/30/2014, 11/14/2015, 12/12/2016, 11/26/2017, 11/10/2018, 10/22/2019, 11/16/2021, 12/14/2021   PFIZER(Purple Top)SARS-COV-2 Vaccination 05/14/2019, 06/09/2019, 12/07/2019   Pfizer(Comirnaty)Fall Seasonal Vaccine 12 years and older 02/15/2022   Pneumococcal Conjugate-13 07/03/2017   Pneumococcal Polysaccharide-23 03/14/2010, 11/14/2015   Tdap 06/19/2016    Tobacco History: Social History   Tobacco Use  Smoking Status Some Days   Current packs/day: 0.50   Average packs/day: 0.5 packs/day for 33.0 years (16.5 ttl pk-yrs)   Types: Cigarettes    Passive exposure: Never  Smokeless Tobacco Never  Tobacco Comments   Pt smokes 1/2 ppd. AB, CMA 01-16-2023   Ready to quit: Yes Counseling given: Yes Tobacco comments: Pt smokes 1/2 ppd. AB, CMA 01-16-2023   Outpatient Encounter Medications as of 05/17/2023  Medication Sig   acetaminophen (TYLENOL) 500 MG tablet Take 1 tablet (500 mg total) by mouth every 6 (six) hours as needed. (Patient taking differently: Take 1,000 mg by mouth every 6 (six) hours as needed for mild pain (pain score 1-3), moderate pain (pain score 4-6) or headache (or abdominal pain).)   bictegravir-emtricitabine-tenofovir AF (BIKTARVY) 50-200-25 MG TABS tablet Take 1 tablet by mouth daily.   buPROPion (WELLBUTRIN SR) 150 MG 12 hr tablet Take 1 tablet (150 mg total) by mouth 2 (two) times daily. Start with once a day for 3 days then increase to twice a day   cetirizine (ZYRTEC) 10 MG tablet Take 10 mg by mouth daily as needed for allergies or rhinitis.   conjugated estrogens (PREMARIN) vaginal cream Place 1 Applicatorful vaginally 2 (two) times a week. Place 0.5g nightly for two weeks then twice a week after (Patient taking differently: Place 0.5 g vaginally See admin instructions. Beginning on 10/26/2022, place 0.5 g vaginally at bedtime for two weeks, then twice a week thereafter)   fluticasone (FLONASE) 50 MCG/ACT nasal spray SHAKE LIQUID AND USE 2 SPRAYS IN EACH NOSTRIL DAILY (Patient taking differently: Place 2 sprays into both nostrils daily as needed for allergies or rhinitis.)   Fluticasone-Umeclidin-Vilant (TRELEGY ELLIPTA) 100-62.5-25 MCG/ACT AEPB INHALE 1 PUFF INTO THE LUNGS EVERY DAY (Patient taking differently: Inhale 1 puff into the lungs daily.)  Fluticasone-Umeclidin-Vilant (TRELEGY ELLIPTA) 100-62.5-25 MCG/ACT AEPB Inhale 1 puff into the lungs daily.   mirabegron ER (MYRBETRIQ) 25 MG TB24 tablet Take 1 tablet (25 mg total) by mouth daily.   Multiple Vitamins-Minerals (MULTIVITAMIN GUMMIES ADULTS PO) Take 1  tablet by mouth daily with breakfast.   Omega-3 Fatty Acids (FISH OIL) 1000 MG CAPS Take 1,000 mg by mouth daily.   omeprazole (PRILOSEC) 20 MG capsule Take 1 capsule (20 mg total) by mouth daily.   rosuvastatin (CRESTOR) 5 MG tablet Take 1 tablet (5 mg total) by mouth daily.   TUMS E-X 750 750 MG chewable tablet Chew 1 tablet by mouth 2 (two) times daily as needed for heartburn.   [DISCONTINUED] amLODipine (NORVASC) 5 MG tablet TAKE 1 TABLET(5 MG) BY MOUTH DAILY (Patient taking differently: Take 5 mg by mouth in the morning.)   [DISCONTINUED] amLODipine (NORVASC) 5 MG tablet Take 5 mg by mouth daily.   amLODipine (NORVASC) 5 MG tablet TAKE 1 TABLET(5 MG) BY MOUTH DAILY   azithromycin (ZITHROMAX Z-PAK) 250 MG tablet Take 2 pills (500mg ) first day and one pill (250mg ) the remaining 4 days. (Patient not taking: Reported on 05/17/2023)   benzonatate (TESSALON) 100 MG capsule Take 1 capsule (100 mg total) by mouth every 8 (eight) hours. (Patient not taking: Reported on 05/17/2023)   Biotin 5000 MCG TABS Take 5,000 mcg by mouth daily. (Patient not taking: Reported on 05/17/2023)   Boric Acid Vaginal 600 MG SUPP Place 600 mg vaginally at bedtime. (Patient not taking: Reported on 05/17/2023)   methylPREDNISolone (MEDROL DOSEPAK) 4 MG TBPK tablet Take as instructed on the packaging (Patient not taking: Reported on 05/17/2023)   nicotine (NICODERM CQ - DOSED IN MG/24 HOURS) 14 mg/24hr patch Place 1 patch (14 mg total) onto the skin daily.   solifenacin (VESICARE) 10 MG tablet Take 10 mg by mouth daily. (Patient not taking: Reported on 05/17/2023)   [DISCONTINUED] nicotine (NICODERM CQ - DOSED IN MG/24 HOURS) 14 mg/24hr patch Place 1 patch (14 mg total) onto the skin daily. (Patient not taking: Reported on 05/17/2023)   No facility-administered encounter medications on file as of 05/17/2023.    Review of Systems  Review of Systems  Constitutional: Negative.   HENT: Negative.    Cardiovascular: Negative.    Gastrointestinal: Negative.   Allergic/Immunologic: Negative.   Neurological: Negative.   Psychiatric/Behavioral: Negative.       Objective:   BP 116/77   Pulse 84   Temp 98.2 F (36.8 C) (Oral)   Wt 141 lb (64 kg)   LMP 03/27/2011   SpO2 100%   BMI 19.67 kg/m   Wt Readings from Last 5 Encounters:  05/17/23 141 lb (64 kg)  03/07/23 143 lb (64.9 kg)  01/16/23 144 lb 9.6 oz (65.6 kg)  12/19/22 144 lb (65.3 kg)  11/08/22 140 lb 8 oz (63.7 kg)     Physical Exam Vitals and nursing note reviewed.  Constitutional:      General: She is not in acute distress.    Appearance: She is well-developed.  Cardiovascular:     Rate and Rhythm: Normal rate and regular rhythm.  Pulmonary:     Effort: Pulmonary effort is normal.     Breath sounds: Normal breath sounds.  Neurological:     Mental Status: She is alert and oriented to person, place, and time.       Assessment & Plan:   Essential hypertension -     amLODIPine Besylate; TAKE 1 TABLET(5 MG) BY  MOUTH DAILY  Dispense: 90 tablet; Refill: 3  Other orders -     Omeprazole; Take 1 capsule (20 mg total) by mouth daily.  Dispense: 30 capsule; Refill: 3 -     Nicotine; Place 1 patch (14 mg total) onto the skin daily.  Dispense: 28 patch; Refill: 0 -     Trelegy Ellipta; Inhale 1 puff into the lungs daily.  Dispense: 1 each; Refill: 11     Return in about 4 weeks (around 06/14/2023) for pap / labs.   Ivonne Andrew, NP 05/17/2023

## 2023-05-21 ENCOUNTER — Other Ambulatory Visit: Payer: Self-pay

## 2023-05-21 ENCOUNTER — Ambulatory Visit

## 2023-05-23 ENCOUNTER — Encounter: Payer: Self-pay | Admitting: Acute Care

## 2023-06-17 ENCOUNTER — Ambulatory Visit
Admission: RE | Admit: 2023-06-17 | Discharge: 2023-06-17 | Disposition: A | Discharge status: Home / Self Care | Source: Ambulatory Visit | Attending: Acute Care | Admitting: Acute Care

## 2023-06-17 DIAGNOSIS — Z122 Encounter for screening for malignant neoplasm of respiratory organs: Secondary | ICD-10-CM

## 2023-06-17 DIAGNOSIS — Z87891 Personal history of nicotine dependence: Secondary | ICD-10-CM

## 2023-06-18 ENCOUNTER — Ambulatory Visit (INDEPENDENT_AMBULATORY_CARE_PROVIDER_SITE_OTHER): Payer: Self-pay | Admitting: Nurse Practitioner

## 2023-06-18 ENCOUNTER — Encounter: Payer: Self-pay | Admitting: Nurse Practitioner

## 2023-06-18 ENCOUNTER — Other Ambulatory Visit (HOSPITAL_COMMUNITY)
Admission: RE | Admit: 2023-06-18 | Discharge: 2023-06-18 | Disposition: A | Discharge status: Home / Self Care | Source: Ambulatory Visit | Attending: Nurse Practitioner | Admitting: Nurse Practitioner

## 2023-06-18 VITALS — BP 130/74 | HR 78 | Temp 98.2°F | Wt 144.4 lb

## 2023-06-18 DIAGNOSIS — Z124 Encounter for screening for malignant neoplasm of cervix: Secondary | ICD-10-CM

## 2023-06-18 DIAGNOSIS — H04123 Dry eye syndrome of bilateral lacrimal glands: Secondary | ICD-10-CM

## 2023-06-18 NOTE — Progress Notes (Signed)
 Subjective   Patient ID: [name], female    DOB: [birth year], 63 y.o.   MRN: [id number]  Chief Complaint  Patient presents with   Gynecologic Exam    Referring provider: Jerrlyn Morel, NP  [name] is a 63 y.o. female with Past Medical History: 11/2018: Anxiety No date: Asthma No date: Hepatitis C     Comment:  Has been treated No date: HIV (human immunodeficiency virus infection) (HCC) No date: Hypertension No date: STD (sexually transmitted disease) No date: Substance abuse (HCC) 07/29/2014: Underweight  HPI  Patient presents today for Pap smear.  She is concerned also about dry eyes.  She would like to be tested for send returns in to have her thyroid tested.  We will check labs for patient request today.Denies f/c/s, n/v/d, hemoptysis, PND, leg swelling Denies chest pain or edema     Allergies  Allergen Reactions   Aspirin Other (See Comments)    "Heart murmur"     Immunization History  Administered Date(s) Administered   Hepatitis A 03/06/2002   Hepatitis B 01/05/2003, 02/04/2003, 07/01/2003   Influenza Split 12/01/2010, 12/05/2011   Influenza Whole 10/24/2005, 11/07/2007, 12/01/2008, 11/30/2009   Influenza, Seasonal, Injecte, Preservative Fre 11/02/2022   Influenza,inj,Quad PF,6+ Mos 12/31/2012, 09/30/2013, 11/30/2014, 11/14/2015, 12/12/2016, 11/26/2017, 11/10/2018, 10/22/2019, 11/16/2021, 12/14/2021   PFIZER(Purple Top)SARS-COV-2 Vaccination 05/14/2019, 06/09/2019, 12/07/2019   Pfizer(Comirnaty)Fall Seasonal Vaccine 12 years and older 02/15/2022   Pneumococcal Conjugate-13 07/03/2017   Pneumococcal Polysaccharide-23 03/14/2010, 11/14/2015   Tdap 06/19/2016    Tobacco History: Social History   Tobacco Use  Smoking Status Some Days   Current packs/day: 0.50   Average packs/day: 0.5 packs/day for 33.0 years (16.5 ttl pk-yrs)   Types: Cigarettes   Passive exposure: Never  Smokeless Tobacco Never  Tobacco Comments   Pt  smokes 1/2 ppd. AB, CMA 01-16-2023   Ready to quit: Yes Counseling given: Yes Tobacco comments: Pt smokes 1/2 ppd. AB, CMA 01-16-2023   Outpatient Encounter Medications as of 06/18/2023  Medication Sig   acetaminophen  (TYLENOL ) 500 MG tablet Take 1 tablet (500 mg total) by mouth every 6 (six) hours as needed. (Patient taking differently: Take 1,000 mg by mouth every 6 (six) hours as needed for mild pain (pain score 1-3), moderate pain (pain score 4-6) or headache (or abdominal pain).)   amLODipine  (NORVASC ) 5 MG tablet TAKE 1 TABLET(5 MG) BY MOUTH DAILY   bictegravir-emtricitabine -tenofovir  AF (BIKTARVY ) 50-200-25 MG TABS tablet Take 1 tablet by mouth daily.   buPROPion  (WELLBUTRIN  SR) 150 MG 12 hr tablet Take 1 tablet (150 mg total) by mouth 2 (two) times daily. Start with once a day for 3 days then increase to twice a day   cetirizine  (ZYRTEC ) 10 MG tablet Take 10 mg by mouth daily as needed for allergies or rhinitis.   conjugated estrogens  (PREMARIN ) vaginal cream Place 1 Applicatorful vaginally 2 (two) times a week. Place 0.5g nightly for two weeks then twice a week after (Patient taking differently: Place 0.5 g vaginally See admin instructions. Beginning on 10/26/2022, place 0.5 g vaginally at bedtime for two weeks, then twice a week thereafter)   fluticasone  (FLONASE ) 50 MCG/ACT nasal spray SHAKE LIQUID AND USE 2 SPRAYS IN EACH NOSTRIL DAILY (Patient taking differently: Place 2 sprays into both nostrils daily as needed for allergies or rhinitis.)   Fluticasone -Umeclidin-Vilant (TRELEGY ELLIPTA ) 100-62.5-25 MCG/ACT AEPB INHALE 1 PUFF INTO THE LUNGS EVERY DAY (Patient taking differently: Inhale 1 puff into the lungs daily.)   Fluticasone -Umeclidin-Vilant (  TRELEGY ELLIPTA ) 100-62.5-25 MCG/ACT AEPB Inhale 1 puff into the lungs daily.   mirabegron  ER (MYRBETRIQ ) 25 MG TB24 tablet Take 1 tablet (25 mg total) by mouth daily.   Multiple Vitamins-Minerals (MULTIVITAMIN GUMMIES ADULTS PO) Take 1 tablet  by mouth daily with breakfast.   Omega-3 Fatty Acids (FISH OIL) 1000 MG CAPS Take 1,000 mg by mouth daily.   omeprazole  (PRILOSEC) 20 MG capsule Take 1 capsule (20 mg total) by mouth daily.   rosuvastatin  (CRESTOR ) 5 MG tablet Take 1 tablet (5 mg total) by mouth daily.   TUMS E-X 750 750 MG chewable tablet Chew 1 tablet by mouth 2 (two) times daily as needed for heartburn.   azithromycin  (ZITHROMAX  Z-PAK) 250 MG tablet Take 2 pills (500mg ) first day and one pill (250mg ) the remaining 4 days. (Patient not taking: Reported on 05/17/2023)   benzonatate  (TESSALON ) 100 MG capsule Take 1 capsule (100 mg total) by mouth every 8 (eight) hours. (Patient not taking: Reported on 05/17/2023)   Biotin 5000 MCG TABS Take 5,000 mcg by mouth daily. (Patient not taking: Reported on 05/17/2023)   Boric Acid Vaginal 600 MG SUPP Place 600 mg vaginally at bedtime. (Patient not taking: Reported on 05/17/2023)   methylPREDNISolone  (MEDROL  DOSEPAK) 4 MG TBPK tablet Take as instructed on the packaging (Patient not taking: Reported on 05/17/2023)   nicotine  (NICODERM CQ  - DOSED IN MG/24 HOURS) 14 mg/24hr patch Place 1 patch (14 mg total) onto the skin daily. (Patient not taking: Reported on 06/18/2023)   solifenacin  (VESICARE ) 10 MG tablet Take 10 mg by mouth daily. (Patient not taking: Reported on 05/17/2023)   No facility-administered encounter medications on file as of 06/18/2023.    Review of Systems  Review of Systems  Constitutional: Negative.   HENT: Negative.    Cardiovascular: Negative.   Gastrointestinal: Negative.   Allergic/Immunologic: Negative.   Neurological: Negative.   Psychiatric/Behavioral: Negative.       Objective:   BP 130/74   Pulse 78   Temp 98.2 F (36.8 C) (Oral)   Wt 144 lb 6.4 oz (65.5 kg)   LMP 03/27/2011   SpO2 99%   BMI 20.14 kg/m   Wt Readings from Last 5 Encounters:  06/18/23 144 lb 6.4 oz (65.5 kg)  05/17/23 141 lb (64 kg)  03/07/23 143 lb (64.9 kg)  01/16/23 144 lb 9.6 oz (65.6  kg)  12/19/22 144 lb (65.3 kg)     Physical Exam Vitals and nursing note reviewed.  Constitutional:      General: She is not in acute distress.    Appearance: She is well-developed.  Cardiovascular:     Rate and Rhythm: Normal rate and regular rhythm.  Pulmonary:     Effort: Pulmonary effort is normal.     Breath sounds: Normal breath sounds.  Neurological:     Mental Status: She is alert and oriented to person, place, and time.       Assessment & Plan:   Dry eyes -     Sjogren's syndrome antibods(ssa + ssb) -     TSH  Cervical cancer screening -     Cytology - PAP     Return in about 1 year (around 06/17/2024).   Jerrlyn Morel, NP 06/18/2023

## 2023-06-19 ENCOUNTER — Ambulatory Visit

## 2023-06-19 ENCOUNTER — Other Ambulatory Visit: Payer: Self-pay

## 2023-06-19 LAB — SJOGREN'S SYNDROME ANTIBODS(SSA + SSB)
ENA SSA (RO) Ab: <0.2 AI (ref 0.0–0.9)
ENA SSB (LA) Ab: <0.2 AI (ref 0.0–0.9)

## 2023-06-19 LAB — TSH: TSH: 1.14 u[IU]/mL (ref 0.450–4.500)

## 2023-06-25 LAB — CYTOLOGY - PAP
Comment: NEGATIVE
Diagnosis: NEGATIVE
High risk HPV: NEGATIVE

## 2023-06-26 ENCOUNTER — Ambulatory Visit: Payer: Self-pay | Admitting: Nurse Practitioner

## 2023-07-12 NOTE — Progress Notes (Signed)
 The 10-year ASCVD risk score (Arnett DK, et al., 2019) is: 10.4%   Values used to calculate the score:     Age: 63 years     Sex: Female     Is Non-Hispanic African American: Yes     Diabetic: No     Tobacco smoker: Yes     Systolic Blood Pressure: 130 mmHg     Is BP treated: Yes     HDL Cholesterol: 85 mg/dL     Total Cholesterol: 158 mg/dL  Currently prescribed rosuvastatin  5 mg.  Marykathleen Russi, BSN, RN

## 2023-07-15 ENCOUNTER — Telehealth: Payer: Self-pay | Admitting: Acute Care

## 2023-07-15 NOTE — Telephone Encounter (Signed)
 Call report received:    IMPRESSION: Lung-RADS 4B, suspicious. Chest CT with or without contrast, PET/CT and/or tissue sampling depending on the probability of malignancy and comorbidities. PET/CT may be used when there is a = 8 mm (= 268 mm) solid component. For new large nodules that develop on an annual repeat screening CT, a 1 month LDCT may be recommended to address potentially infectious or inflammatory conditions Additional imaging evaluation or consultation with Pulmonology or Thoracic Surgery recommended.   Enlarging pulmonary nodule within the posterior right apex.   Mild emphysema.  Associated airway inflammation.   Aortic Atherosclerosis (ICD10-I70.0) and Emphysema (ICD10-J43.9).     Electronically Signed   By: Worthy Heads M.D.   On: 07/13/2023 00:38

## 2023-07-18 ENCOUNTER — Ambulatory Visit

## 2023-07-19 ENCOUNTER — Telehealth: Payer: Self-pay | Admitting: Acute Care

## 2023-07-19 DIAGNOSIS — R911 Solitary pulmonary nodule: Secondary | ICD-10-CM

## 2023-07-19 NOTE — Telephone Encounter (Signed)
 Result and plan sent to PCP. Reminder set to confirm PET scheduled and f/u appt.

## 2023-07-19 NOTE — Telephone Encounter (Signed)
 I have called the patient with the result of her low dose Ct Chest. Her scan was read as a LR 4 B. There is a stable band like parenchymal scarring within the posterior right upper lobe, favored to represent scarring over malignancy based on prior PET CT examination of 04/13/2022. Additional subpleural cavitary nodule within the right upper lobe demonstrates slight interval increase in size (mean 10.3 mm, 62/3). I have reviewed this scan with Dr.Gonzalez. She feels we need to move forward with PET to better evaluate the scan. Plan is for PET with follow up in the office to review the scan. This will ne a new consult for a nodule.  Please leave messages on the VM, as patient works 10 hours per day, so please leave message for PET and follow up appointments.  Thanks so much

## 2023-07-29 ENCOUNTER — Telehealth: Payer: Self-pay | Admitting: *Deleted

## 2023-07-29 NOTE — Telephone Encounter (Signed)
 error

## 2023-08-01 ENCOUNTER — Other Ambulatory Visit (HOSPITAL_COMMUNITY)

## 2023-08-02 ENCOUNTER — Ambulatory Visit (HOSPITAL_COMMUNITY)
Admission: RE | Admit: 2023-08-02 | Discharge: 2023-08-02 | Disposition: A | Discharge status: Home / Self Care | Source: Ambulatory Visit | Attending: Acute Care | Admitting: Acute Care

## 2023-08-02 DIAGNOSIS — R911 Solitary pulmonary nodule: Secondary | ICD-10-CM | POA: Diagnosis present

## 2023-08-02 LAB — GLUCOSE, CAPILLARY: Glucose-Capillary: 89 mg/dL (ref 70–99)

## 2023-08-02 MED ORDER — FLUDEOXYGLUCOSE F - 18 (FDG) INJECTION
7.2000 | Freq: Once | INTRAVENOUS | Status: AC
  Administered 2023-08-02: 7.48 via INTRAVENOUS

## 2023-08-06 ENCOUNTER — Ambulatory Visit (INDEPENDENT_AMBULATORY_CARE_PROVIDER_SITE_OTHER): Admitting: Acute Care

## 2023-08-06 ENCOUNTER — Other Ambulatory Visit: Payer: Self-pay | Admitting: Acute Care

## 2023-08-06 ENCOUNTER — Encounter: Payer: Self-pay | Admitting: Emergency Medicine

## 2023-08-06 ENCOUNTER — Encounter: Payer: Self-pay | Admitting: Acute Care

## 2023-08-06 ENCOUNTER — Telehealth: Payer: Self-pay | Admitting: Acute Care

## 2023-08-06 VITALS — BP 143/84 | HR 72 | Ht 72.0 in | Wt 142.0 lb

## 2023-08-06 DIAGNOSIS — F17210 Nicotine dependence, cigarettes, uncomplicated: Secondary | ICD-10-CM | POA: Diagnosis not present

## 2023-08-06 DIAGNOSIS — R911 Solitary pulmonary nodule: Secondary | ICD-10-CM | POA: Insufficient documentation

## 2023-08-06 DIAGNOSIS — F17200 Nicotine dependence, unspecified, uncomplicated: Secondary | ICD-10-CM

## 2023-08-06 MED ORDER — TRELEGY ELLIPTA 100-62.5-25 MCG/ACT IN AEPB
1.0000 | INHALATION_SPRAY | Freq: Every day | RESPIRATORY_TRACT | Status: AC
Start: 2023-08-06 — End: ?

## 2023-08-06 NOTE — Progress Notes (Signed)
 History of Present Illness [name] is a 63 y.o. female followed through the lung cancer screening program, here for consultation for an abnormal lung cancer Screening CT Chest.    08/06/2023 [name] is a 63 year old female who presents for evaluation of a right upper lobe lung nodule noted to be increasing in size on her most recent lung cancer screening scan.  She has been monitored for a right upper lobe lung nodule that has gradually enlarged over the past year and a half, increasing from 6.5 mm to 10.3 mm. Recent imaging includes a CT scan and then a PET scan, which showed an SUV of 1.4, slightly decreased from a previous SUV of 1.9. Per radiology they recommended a 36-month follow-up.  Patient however does not want to do a watchful wait and would rather know definitively if this is a cancer.  She is a current smoker attempting to quit by cutting back and using starter patches. Financial constraints have limited her ability to continue using smoking cessation aids. She has experienced weight fluctuations, previously weighing 156 lbs, dropping to 134 lbs, and currently stabilizing around 140 lbs. 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  No family history of lung cancer is reported, but there is mention of an unspecified cancer related to her mother's .  She has not experienced any recent episodes of hemoptysis but notes increased shortness of breath and decreased energy, which she associates with not taking her Trelegy medication.  She is currently unable to afford her Trelegy medication.  We will complete financial assistance paperwork and give her some samples today.  Her current medications include Crestor , Prilosec, fatty acids, Mirabegron , Trelegy, Flonase , and Premarin . She is not on any blood thinners or aspirin. She has no known allergies to latex and denies having  sleep apnea or diabetes.  As patient prefers not to take a watchful waiting approach, we will go ahead and schedule bronchoscopy with biopsies for definitive tissue diagnosis .  She is not on any blood thinners or diabetic medications.  Test Results: PET scan 08/02/2023, Read 08/03/2023 No abnormal radiotracer uptake seen above blood pool in the axillary region, hilum or mediastinum. The right upper lobe increasing ill-defined nodular focus is again seen today on series 4, image 65, similar in size measuring 13 by 8 mm. Previously 12 x 8 mm. The there is some minimal uptake in this location maximum SUV of 1.4. On the prior PET-CT this same area has maximum SUV of 1.9. No other areas of abnormal uptake along lung parenchyma.  Right upper lobe nodularity has no significant abnormal uptake. The recommend short-term CT surveillance in 3 months. If there is more clinical concern tissue sampling could be considered.   No soft tissue or nodal areas of abnormal uptake otherwise.   Stable areas of increased density along the mid to lower aspect the left kidney, similar to previous. Recommend dedicated pre and post contrast examination when appropriate with either CT or MRI.  06/17/2023 low-dose CT chest, read  07/13/2023 Mild biapical scarring. Mild emphysema. Minimal bibasilar fibrotic change. Stable band like parenchymal scarring within the posterior right upper lobe, favored to represent scarring over malignancy based on prior PET CT examination of 04/13/2022. Additional subpleural cavitary nodule within the right upper lobe demonstrates slight interval increase in size (mean 10.3 mm, 62/3). See radiologist derived volumetric analysis. No pneumothorax or pleural effusion.  No central obstructing lesion.  Lung-RADS 4B, suspicious. Chest CT with or without contrast, PET/CT and/or tissue sampling depending on the probability of malignancy and comorbidities. PET/CT may be used when there is a = 8  mm (= 268 mm) solid component. For new large nodules that develop on an annual repeat screening CT, a 1 month LDCT may be recommended to address potentially infectious or inflammatory conditions Additional imaging evaluation or consultation with Pulmonology or Thoracic Surgery recommended.   Enlarging pulmonary nodule within the posterior right apex.   Mild emphysema.  Associated airway inflammation.     Latest Ref Rng & Units 02/21/2023    2:58 AM 11/09/2022   12:59 PM 11/08/2022    2:41 PM  CBC  WBC 3.8 - 10.8 Thousand/uL 3.6  3.5  4.1   Hemoglobin 11.7 - 15.5 g/dL 87.5  87.3  87.8   Hematocrit 35.0 - 45.0 % 37.9  40.6  38.9   Platelets 140 - 400 Thousand/uL 183  208  195        Latest Ref Rng & Units 02/21/2023    2:58 AM 11/09/2022   12:59 PM 11/08/2022    2:41 PM  BMP  Glucose 65 - 99 mg/dL 73  891  96   BUN 7 - 25 mg/dL 15  14  22    Creatinine 0.50 - 1.05 mg/dL 8.81  8.96  8.70   BUN/Creat Ratio 6 - 22 (calc) 13     Sodium 135 - 146 mmol/L 140  139  140   Potassium 3.5 - 5.3 mmol/L 3.9  3.8  4.2   Chloride 98 - 110 mmol/L 103  105  104   CO2 20 - 32 mmol/L 27  27  26    Calcium  8.6 - 10.4 mg/dL 9.9  9.1  9.5     BNP No results found for: BNP  ProBNP No results found for: PROBNP  PFT    Component Value Date/Time   FEV1PRE 1.66 04/18/2022 1047   FEV1POST 1.14 04/18/2022 1047   FVCPRE 2.69 04/18/2022 1047   FVCPOST 3.19 04/18/2022 1047   TLC 7.78 04/18/2022 1047   DLCOUNC 0.69 04/18/2022 1047   PREFEV1FVCRT 62 04/18/2022 1047   PSTFEV1FVCRT 36 04/18/2022 1047    NM PET Image Initial (PI) Skull Base To Thigh Result Date: 08/03/2023 CLINICAL DATA:  Initial treatment strategy for lung nodule. EXAM: NUCLEAR MEDICINE PET SKULL BASE TO THIGH TECHNIQUE: 7.48 mCi F-18 FDG was injected intravenously. Full-ring PET imaging was performed from the skull base to thigh after the radiotracer. CT data was obtained and used for attenuation correction and anatomic  localization. Fasting blood glucose: 89 mg/dl COMPARISON:  Lung cancer screening CT 06/17/2023 and older. Prior PET-CT scan 04/13/2022. FINDINGS: Mediastinal blood pool activity: SUV max 2.2 Liver activity: SUV max 3.1 NECK: No abnormal uptake identified in the neck including along lymph node change of the submandibular posterior triangle or internal jugular region. Near symmetric uptake of the visualized portions of the intracranial compartment. Incidental CT findings: Paranasal sinuses and mastoid air cells are clear. The parotid glands, submandibular glands are unremarkable. Small thyroid gland. Streak artifact related to the patient's dental hardware. The CHEST: No abnormal radiotracer uptake seen above blood pool in the axillary region, hilum or mediastinum. The right upper lobe increasing ill-defined nodular focus is again seen today on series 4, image 65, similar in size measuring 13 by 8 mm. Previously 12 x 8 mm. The there is some minimal uptake in this location maximum  SUV of 1.4. On the prior PET-CT this same area has maximum SUV of 1.9. No other areas of abnormal uptake along lung parenchyma. Incidental CT findings: Breathing motion. No pleural effusion or pneumothorax. Other areas of nodularity along the lungs seen on the prior examination are not as well defined with the motion. Apical pleural thickening. Mild scattered vascular calcifications. Heart is nonenlarged. No pericardial effusion. Normal caliber thoracic esophagus. ABDOMEN/PELVIS: There is physiologic distribution radiotracer along the parenchymal organs, bowel and renal collecting systems. Minimal uptake along the antrum of the stomach. Nonspecific. No abnormal uptake along lymph node change in the abdomen and pelvis. Incidental CT findings: On this limited CT examination, grossly the liver, spleen, adrenal glands and pancreas are unremarkable. Prior cholecystectomy. There is breathing motion. There is some areas of increased density along  the lower pole left kidney as well as along the midportion of the kidney laterally, unchanged from previous. The no ureteral stones identified. Underdistended urinary bladder. Uterus is present. Redundant course to the sigmoid colon. Few sigmoid colon diverticula. Scattered colonic stool. Surgical changes along the base of the cecum. Please correlate with any history such as prior appendectomy. The stomach and small bowel are nondilated. No free air or free fluid. Scattered vascular calcifications. SKELETON: No specific abnormal uptake along the visualized osseous structures. Incidental CT findings: Mild degenerative changes. IMPRESSION: Right upper lobe nodularity has no significant abnormal uptake. The recommend short-term CT surveillance in 3 months. If there is more clinical concern tissue sampling could be considered. No soft tissue or nodal areas of abnormal uptake otherwise. Stable areas of increased density along the mid to lower aspect the left kidney, similar to previous. Recommend dedicated pre and post contrast examination when appropriate with either CT or MRI. Electronically Signed   By: Ranell Bring M.D.   On: 08/03/2023 10:17     Past medical hx Past Medical History:  Diagnosis Date   Anxiety 11/2018   Asthma    Hepatitis C    Has been treated   HIV (human immunodeficiency virus infection) (HCC)    Hypertension    STD (sexually transmitted disease)    Substance abuse (HCC)    Underweight 07/29/2014     Social History   Tobacco Use   Smoking status: Some Days    Current packs/day: 0.50    Average packs/day: 0.5 packs/day for 33.0 years (16.5 ttl pk-yrs)    Types: Cigarettes    Passive exposure: Never   Smokeless tobacco: Never   Tobacco comments:     7 cigarettes a day-08/06/2023-KRD  Vaping Use   Vaping status: Never Used  Substance Use Topics   Alcohol use: Yes    Alcohol/week: 5.0 standard drinks of alcohol    Types: 5 Cans of beer per week   Drug use: No     Comment: clean for 10 years    Ms.Vantassel reports that she has been smoking cigarettes. She has a 16.5 pack-year smoking history. She has never been exposed to tobacco smoke. She has never used smokeless tobacco. She reports current alcohol use of about 5.0 standard drinks of alcohol per week. She reports that she does not use drugs.  Tobacco Cessation: Ready to quit: Not Answered Counseling given: Not Answered Tobacco comments:  7 cigarettes a day-08/06/2023-KRD Current every day smoker  Working on cutting down  Past surgical hx, Family hx, Social hx all reviewed.  Current Outpatient Medications on File Prior to Visit  Medication Sig   acetaminophen  (TYLENOL ) 500 MG  tablet Take 1 tablet (500 mg total) by mouth every 6 (six) hours as needed. (Patient taking differently: Take 1,000 mg by mouth every 6 (six) hours as needed for mild pain (pain score 1-3), moderate pain (pain score 4-6) or headache (or abdominal pain).)   amLODipine  (NORVASC ) 5 MG tablet TAKE 1 TABLET(5 MG) BY MOUTH DAILY   benzonatate  (TESSALON ) 100 MG capsule Take 1 capsule (100 mg total) by mouth every 8 (eight) hours.   bictegravir-emtricitabine -tenofovir  AF (BIKTARVY ) 50-200-25 MG TABS tablet Take 1 tablet by mouth daily.   Biotin 5000 MCG TABS Take 5,000 mcg by mouth daily.   buPROPion  (WELLBUTRIN  SR) 150 MG 12 hr tablet Take 1 tablet (150 mg total) by mouth 2 (two) times daily. Start with once a day for 3 days then increase to twice a day   cetirizine  (ZYRTEC ) 10 MG tablet Take 10 mg by mouth daily as needed for allergies or rhinitis.   conjugated estrogens  (PREMARIN ) vaginal cream Place 1 Applicatorful vaginally 2 (two) times a week. Place 0.5g nightly for two weeks then twice a week after (Patient taking differently: Place 0.5 g vaginally See admin instructions. Beginning on 10/26/2022, place 0.5 g vaginally at bedtime for two weeks, then twice a week thereafter)   fluticasone  (FLONASE ) 50 MCG/ACT nasal spray SHAKE  LIQUID AND USE 2 SPRAYS IN EACH NOSTRIL DAILY (Patient taking differently: Place 2 sprays into both nostrils daily as needed for allergies or rhinitis.)   Fluticasone -Umeclidin-Vilant (TRELEGY ELLIPTA ) 100-62.5-25 MCG/ACT AEPB INHALE 1 PUFF INTO THE LUNGS EVERY DAY (Patient taking differently: Inhale 1 puff into the lungs daily.)   mirabegron  ER (MYRBETRIQ ) 25 MG TB24 tablet Take 1 tablet (25 mg total) by mouth daily.   Multiple Vitamins-Minerals (MULTIVITAMIN GUMMIES ADULTS PO) Take 1 tablet by mouth daily with breakfast.   Omega-3 Fatty Acids (FISH OIL) 1000 MG CAPS Take 1,000 mg by mouth daily.   omeprazole  (PRILOSEC) 20 MG capsule Take 1 capsule (20 mg total) by mouth daily.   rosuvastatin  (CRESTOR ) 5 MG tablet Take 1 tablet (5 mg total) by mouth daily.   solifenacin  (VESICARE ) 10 MG tablet Take 10 mg by mouth daily.   TUMS E-X 750 750 MG chewable tablet Chew 1 tablet by mouth 2 (two) times daily as needed for heartburn.   azithromycin  (ZITHROMAX  Z-PAK) 250 MG tablet Take 2 pills (500mg ) first day and one pill (250mg ) the remaining 4 days. (Patient not taking: Reported on 08/06/2023)   Boric Acid Vaginal 600 MG SUPP Place 600 mg vaginally at bedtime. (Patient not taking: Reported on 08/06/2023)   Fluticasone -Umeclidin-Vilant (TRELEGY ELLIPTA ) 100-62.5-25 MCG/ACT AEPB Inhale 1 puff into the lungs daily. (Patient not taking: Reported on 08/06/2023)   methylPREDNISolone  (MEDROL  DOSEPAK) 4 MG TBPK tablet Take as instructed on the packaging (Patient not taking: Reported on 08/06/2023)   nicotine  (NICODERM CQ  - DOSED IN MG/24 HOURS) 14 mg/24hr patch Place 1 patch (14 mg total) onto the skin daily. (Patient not taking: Reported on 08/06/2023)   No current facility-administered medications on file prior to visit.     Allergies  Allergen Reactions   Aspirin Other (See Comments)    Heart murmur     Review Of Systems:  Constitutional:   + weight loss, No night sweats,  Fevers, chills, fatigue, or   lassitude.  HEENT:   No headaches,  Difficulty swallowing,  Tooth/dental problems, or  Sore throat,                No sneezing, itching,  ear ache, nasal congestion, post nasal drip,   CV:  No chest pain,  Orthopnea, PND, swelling in lower extremities, anasarca, dizziness, palpitations, syncope.   GI  No heartburn, indigestion, abdominal pain, nausea, vomiting, diarrhea, change in bowel habits, loss of appetite, bloody stools.   Resp: No shortness of breath with exertion or at rest.  No excess mucus, no productive cough,  No non-productive cough,  No coughing up of blood.  No change in color of mucus.  No wheezing.  No chest wall deformity  Skin: no rash or lesions.  GU: no dysuria, change in color of urine, no urgency or frequency.  No flank pain, no hematuria   MS:  No joint pain or swelling.  No decreased range of motion.  No back pain.  Psych:  No change in mood or affect. No depression or anxiety.  No memory loss.   Vital Signs BP (!) 143/84 (BP Location: Left Arm, Patient Position: Sitting, Cuff Size: Normal)   Pulse 72   Ht 6' (1.829 m)   Wt 142 lb (64.4 kg)   LMP 03/27/2011   SpO2 96%   BMI 19.26 kg/m    Physical Exam:  General- No distress,  A&Ox3, pleasant ENT: No sinus tenderness, TM clear, pale nasal mucosa, no oral exudate,no post nasal drip, no LAN Cardiac: S1, S2, regular rate and rhythm, no murmur Chest: No wheeze/ rales/ dullness; no accessory muscle use, no nasal flaring, no sternal retractions Abd.: Soft Non-tender, ND, BS +, Body mass index is 19.26 kg/m.  Ext: No clubbing cyanosis, edema, no obvious deformities Neuro:  normal strength, MAE x 4, A&O x 3 Skin: No rashes, warm and dry, no obvious lesions  Psych: normal mood and behavior   Assessment/Plan Abnormal lung cancer Screening CT Chest PET scan with low SUV of 1.4 Current every day smoker Pt would prefer to move forward with bronch for definitive diagnosis Plan It is good to see you  today. We have reviewed your PET scan . You have decided to move forward with a biopsy vs doing a 3 month follow up scan because you want to know what we are seeing on the scan.  I have placed an order for a bronchoscopy with biopsies.  We have discussed the procedure in detail.  We have reviewed the risks and benefits of the procedure. These include bleeding, infection, puncture of the lung, and adverse reaction to anesthesia. You have agreed to proceed with biopsy to evaluate the right upper lobe Your procedure will be done by Dr. Lamar Chris. You will receive a letter today with date time and information pertaining to the procedure. You will need someone to drive you to the procedure, stay with you during the procedure, and stay with you after the procedure. You will also need someone to stay with you for 24 hours after anesthesia to ensure you have cleared and are doing well. You will follow-up with me 1 week after the procedure to review the results and to ensure you are doing well. We will give you some Trelegy samples today. We will have you fill out the financial aid paperwork.  Call if you need us  prior to the procedure or if you have any questions at all. Please contact office for sooner follow up if symptoms do not improve or worsen or seek emergency care    You can receive free nicotine  replacement therapy (patches, gum, or mints) by calling 1-800-QUIT NOW. Please call so we can get you on  the path to becoming a non-smoker. I know it is hard, but you can do this!  Hypnosis for smoking cessation  [employer] [phone]  Acupuncture for smoking cessation  [employer] [phone]    I spent 30 minutes dedicated to the care of this patient on the date of this encounter to include pre-visit review of records, face-to-face time with the patient discussing conditions above, post visit ordering of testing, clinical documentation with the electronic health  record, making appropriate referrals as documented, and communicating necessary information to the patient's healthcare team.    Lauraine JULIANNA Lites, NP 08/06/2023  9:47 AM

## 2023-08-06 NOTE — H&P (View-Only) (Signed)
 History of Present Illness [name] is a 63 y.o. female followed through the lung cancer screening program, here for consultation for an abnormal lung cancer Screening CT Chest.    08/06/2023 [name] is a 63 year old female who presents for evaluation of a right upper lobe lung nodule noted to be increasing in size on her most recent lung cancer screening scan.  She has been monitored for a right upper lobe lung nodule that has gradually enlarged over the past year and a half, increasing from 6.5 mm to 10.3 mm. Recent imaging includes a CT scan and then a PET scan, which showed an SUV of 1.4, slightly decreased from a previous SUV of 1.9. Per radiology they recommended a 36-month follow-up.  Patient however does not want to do a watchful wait and would rather know definitively if this is a cancer.  She is a current smoker attempting to quit by cutting back and using starter patches. Financial constraints have limited her ability to continue using smoking cessation aids. She has experienced weight fluctuations, previously weighing 156 lbs, dropping to 134 lbs, and currently stabilizing around 140 lbs. 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  No family history of lung cancer is reported, but there is mention of an unspecified cancer related to her mother's .  She has not experienced any recent episodes of hemoptysis but notes increased shortness of breath and decreased energy, which she associates with not taking her Trelegy medication.  She is currently unable to afford her Trelegy medication.  We will complete financial assistance paperwork and give her some samples today.  Her current medications include Crestor , Prilosec, fatty acids, Mirabegron , Trelegy, Flonase , and Premarin . She is not on any blood thinners or aspirin. She has no known allergies to latex and denies having  sleep apnea or diabetes.  As patient prefers not to take a watchful waiting approach, we will go ahead and schedule bronchoscopy with biopsies for definitive tissue diagnosis .  She is not on any blood thinners or diabetic medications.  Test Results: PET scan 08/02/2023, Read 08/03/2023 No abnormal radiotracer uptake seen above blood pool in the axillary region, hilum or mediastinum. The right upper lobe increasing ill-defined nodular focus is again seen today on series 4, image 65, similar in size measuring 13 by 8 mm. Previously 12 x 8 mm. The there is some minimal uptake in this location maximum SUV of 1.4. On the prior PET-CT this same area has maximum SUV of 1.9. No other areas of abnormal uptake along lung parenchyma.  Right upper lobe nodularity has no significant abnormal uptake. The recommend short-term CT surveillance in 3 months. If there is more clinical concern tissue sampling could be considered.   No soft tissue or nodal areas of abnormal uptake otherwise.   Stable areas of increased density along the mid to lower aspect the left kidney, similar to previous. Recommend dedicated pre and post contrast examination when appropriate with either CT or MRI.  06/17/2023 low-dose CT chest, read  07/13/2023 Mild biapical scarring. Mild emphysema. Minimal bibasilar fibrotic change. Stable band like parenchymal scarring within the posterior right upper lobe, favored to represent scarring over malignancy based on prior PET CT examination of 04/13/2022. Additional subpleural cavitary nodule within the right upper lobe demonstrates slight interval increase in size (mean 10.3 mm, 62/3). See radiologist derived volumetric analysis. No pneumothorax or pleural effusion.  No central obstructing lesion.  Lung-RADS 4B, suspicious. Chest CT with or without contrast, PET/CT and/or tissue sampling depending on the probability of malignancy and comorbidities. PET/CT may be used when there is a = 8  mm (= 268 mm) solid component. For new large nodules that develop on an annual repeat screening CT, a 1 month LDCT may be recommended to address potentially infectious or inflammatory conditions Additional imaging evaluation or consultation with Pulmonology or Thoracic Surgery recommended.   Enlarging pulmonary nodule within the posterior right apex.   Mild emphysema.  Associated airway inflammation.     Latest Ref Rng & Units 02/21/2023    2:58 AM 11/09/2022   12:59 PM 11/08/2022    2:41 PM  CBC  WBC 3.8 - 10.8 Thousand/uL 3.6  3.5  4.1   Hemoglobin 11.7 - 15.5 g/dL 87.5  87.3  87.8   Hematocrit 35.0 - 45.0 % 37.9  40.6  38.9   Platelets 140 - 400 Thousand/uL 183  208  195        Latest Ref Rng & Units 02/21/2023    2:58 AM 11/09/2022   12:59 PM 11/08/2022    2:41 PM  BMP  Glucose 65 - 99 mg/dL 73  891  96   BUN 7 - 25 mg/dL 15  14  22    Creatinine 0.50 - 1.05 mg/dL 8.81  8.96  8.70   BUN/Creat Ratio 6 - 22 (calc) 13     Sodium 135 - 146 mmol/L 140  139  140   Potassium 3.5 - 5.3 mmol/L 3.9  3.8  4.2   Chloride 98 - 110 mmol/L 103  105  104   CO2 20 - 32 mmol/L 27  27  26    Calcium  8.6 - 10.4 mg/dL 9.9  9.1  9.5     BNP No results found for: BNP  ProBNP No results found for: PROBNP  PFT    Component Value Date/Time   FEV1PRE 1.66 04/18/2022 1047   FEV1POST 1.14 04/18/2022 1047   FVCPRE 2.69 04/18/2022 1047   FVCPOST 3.19 04/18/2022 1047   TLC 7.78 04/18/2022 1047   DLCOUNC 0.69 04/18/2022 1047   PREFEV1FVCRT 62 04/18/2022 1047   PSTFEV1FVCRT 36 04/18/2022 1047    NM PET Image Initial (PI) Skull Base To Thigh Result Date: 08/03/2023 CLINICAL DATA:  Initial treatment strategy for lung nodule. EXAM: NUCLEAR MEDICINE PET SKULL BASE TO THIGH TECHNIQUE: 7.48 mCi F-18 FDG was injected intravenously. Full-ring PET imaging was performed from the skull base to thigh after the radiotracer. CT data was obtained and used for attenuation correction and anatomic  localization. Fasting blood glucose: 89 mg/dl COMPARISON:  Lung cancer screening CT 06/17/2023 and older. Prior PET-CT scan 04/13/2022. FINDINGS: Mediastinal blood pool activity: SUV max 2.2 Liver activity: SUV max 3.1 NECK: No abnormal uptake identified in the neck including along lymph node change of the submandibular posterior triangle or internal jugular region. Near symmetric uptake of the visualized portions of the intracranial compartment. Incidental CT findings: Paranasal sinuses and mastoid air cells are clear. The parotid glands, submandibular glands are unremarkable. Small thyroid gland. Streak artifact related to the patient's dental hardware. The CHEST: No abnormal radiotracer uptake seen above blood pool in the axillary region, hilum or mediastinum. The right upper lobe increasing ill-defined nodular focus is again seen today on series 4, image 65, similar in size measuring 13 by 8 mm. Previously 12 x 8 mm. The there is some minimal uptake in this location maximum  SUV of 1.4. On the prior PET-CT this same area has maximum SUV of 1.9. No other areas of abnormal uptake along lung parenchyma. Incidental CT findings: Breathing motion. No pleural effusion or pneumothorax. Other areas of nodularity along the lungs seen on the prior examination are not as well defined with the motion. Apical pleural thickening. Mild scattered vascular calcifications. Heart is nonenlarged. No pericardial effusion. Normal caliber thoracic esophagus. ABDOMEN/PELVIS: There is physiologic distribution radiotracer along the parenchymal organs, bowel and renal collecting systems. Minimal uptake along the antrum of the stomach. Nonspecific. No abnormal uptake along lymph node change in the abdomen and pelvis. Incidental CT findings: On this limited CT examination, grossly the liver, spleen, adrenal glands and pancreas are unremarkable. Prior cholecystectomy. There is breathing motion. There is some areas of increased density along  the lower pole left kidney as well as along the midportion of the kidney laterally, unchanged from previous. The no ureteral stones identified. Underdistended urinary bladder. Uterus is present. Redundant course to the sigmoid colon. Few sigmoid colon diverticula. Scattered colonic stool. Surgical changes along the base of the cecum. Please correlate with any history such as prior appendectomy. The stomach and small bowel are nondilated. No free air or free fluid. Scattered vascular calcifications. SKELETON: No specific abnormal uptake along the visualized osseous structures. Incidental CT findings: Mild degenerative changes. IMPRESSION: Right upper lobe nodularity has no significant abnormal uptake. The recommend short-term CT surveillance in 3 months. If there is more clinical concern tissue sampling could be considered. No soft tissue or nodal areas of abnormal uptake otherwise. Stable areas of increased density along the mid to lower aspect the left kidney, similar to previous. Recommend dedicated pre and post contrast examination when appropriate with either CT or MRI. Electronically Signed   By: Ranell Bring M.D.   On: 08/03/2023 10:17     Past medical hx Past Medical History:  Diagnosis Date   Anxiety 11/2018   Asthma    Hepatitis C    Has been treated   HIV (human immunodeficiency virus infection) (HCC)    Hypertension    STD (sexually transmitted disease)    Substance abuse (HCC)    Underweight 07/29/2014     Social History   Tobacco Use   Smoking status: Some Days    Current packs/day: 0.50    Average packs/day: 0.5 packs/day for 33.0 years (16.5 ttl pk-yrs)    Types: Cigarettes    Passive exposure: Never   Smokeless tobacco: Never   Tobacco comments:     7 cigarettes a day-08/06/2023-KRD  Vaping Use   Vaping status: Never Used  Substance Use Topics   Alcohol use: Yes    Alcohol/week: 5.0 standard drinks of alcohol    Types: 5 Cans of beer per week   Drug use: No     Comment: clean for 10 years    Ms.Vantassel reports that she has been smoking cigarettes. She has a 16.5 pack-year smoking history. She has never been exposed to tobacco smoke. She has never used smokeless tobacco. She reports current alcohol use of about 5.0 standard drinks of alcohol per week. She reports that she does not use drugs.  Tobacco Cessation: Ready to quit: Not Answered Counseling given: Not Answered Tobacco comments:  7 cigarettes a day-08/06/2023-KRD Current every day smoker  Working on cutting down  Past surgical hx, Family hx, Social hx all reviewed.  Current Outpatient Medications on File Prior to Visit  Medication Sig   acetaminophen  (TYLENOL ) 500 MG  tablet Take 1 tablet (500 mg total) by mouth every 6 (six) hours as needed. (Patient taking differently: Take 1,000 mg by mouth every 6 (six) hours as needed for mild pain (pain score 1-3), moderate pain (pain score 4-6) or headache (or abdominal pain).)   amLODipine  (NORVASC ) 5 MG tablet TAKE 1 TABLET(5 MG) BY MOUTH DAILY   benzonatate  (TESSALON ) 100 MG capsule Take 1 capsule (100 mg total) by mouth every 8 (eight) hours.   bictegravir-emtricitabine -tenofovir  AF (BIKTARVY ) 50-200-25 MG TABS tablet Take 1 tablet by mouth daily.   Biotin 5000 MCG TABS Take 5,000 mcg by mouth daily.   buPROPion  (WELLBUTRIN  SR) 150 MG 12 hr tablet Take 1 tablet (150 mg total) by mouth 2 (two) times daily. Start with once a day for 3 days then increase to twice a day   cetirizine  (ZYRTEC ) 10 MG tablet Take 10 mg by mouth daily as needed for allergies or rhinitis.   conjugated estrogens  (PREMARIN ) vaginal cream Place 1 Applicatorful vaginally 2 (two) times a week. Place 0.5g nightly for two weeks then twice a week after (Patient taking differently: Place 0.5 g vaginally See admin instructions. Beginning on 10/26/2022, place 0.5 g vaginally at bedtime for two weeks, then twice a week thereafter)   fluticasone  (FLONASE ) 50 MCG/ACT nasal spray SHAKE  LIQUID AND USE 2 SPRAYS IN EACH NOSTRIL DAILY (Patient taking differently: Place 2 sprays into both nostrils daily as needed for allergies or rhinitis.)   Fluticasone -Umeclidin-Vilant (TRELEGY ELLIPTA ) 100-62.5-25 MCG/ACT AEPB INHALE 1 PUFF INTO THE LUNGS EVERY DAY (Patient taking differently: Inhale 1 puff into the lungs daily.)   mirabegron  ER (MYRBETRIQ ) 25 MG TB24 tablet Take 1 tablet (25 mg total) by mouth daily.   Multiple Vitamins-Minerals (MULTIVITAMIN GUMMIES ADULTS PO) Take 1 tablet by mouth daily with breakfast.   Omega-3 Fatty Acids (FISH OIL) 1000 MG CAPS Take 1,000 mg by mouth daily.   omeprazole  (PRILOSEC) 20 MG capsule Take 1 capsule (20 mg total) by mouth daily.   rosuvastatin  (CRESTOR ) 5 MG tablet Take 1 tablet (5 mg total) by mouth daily.   solifenacin  (VESICARE ) 10 MG tablet Take 10 mg by mouth daily.   TUMS E-X 750 750 MG chewable tablet Chew 1 tablet by mouth 2 (two) times daily as needed for heartburn.   azithromycin  (ZITHROMAX  Z-PAK) 250 MG tablet Take 2 pills (500mg ) first day and one pill (250mg ) the remaining 4 days. (Patient not taking: Reported on 08/06/2023)   Boric Acid Vaginal 600 MG SUPP Place 600 mg vaginally at bedtime. (Patient not taking: Reported on 08/06/2023)   Fluticasone -Umeclidin-Vilant (TRELEGY ELLIPTA ) 100-62.5-25 MCG/ACT AEPB Inhale 1 puff into the lungs daily. (Patient not taking: Reported on 08/06/2023)   methylPREDNISolone  (MEDROL  DOSEPAK) 4 MG TBPK tablet Take as instructed on the packaging (Patient not taking: Reported on 08/06/2023)   nicotine  (NICODERM CQ  - DOSED IN MG/24 HOURS) 14 mg/24hr patch Place 1 patch (14 mg total) onto the skin daily. (Patient not taking: Reported on 08/06/2023)   No current facility-administered medications on file prior to visit.     Allergies  Allergen Reactions   Aspirin Other (See Comments)    Heart murmur     Review Of Systems:  Constitutional:   + weight loss, No night sweats,  Fevers, chills, fatigue, or   lassitude.  HEENT:   No headaches,  Difficulty swallowing,  Tooth/dental problems, or  Sore throat,                No sneezing, itching,  ear ache, nasal congestion, post nasal drip,   CV:  No chest pain,  Orthopnea, PND, swelling in lower extremities, anasarca, dizziness, palpitations, syncope.   GI  No heartburn, indigestion, abdominal pain, nausea, vomiting, diarrhea, change in bowel habits, loss of appetite, bloody stools.   Resp: No shortness of breath with exertion or at rest.  No excess mucus, no productive cough,  No non-productive cough,  No coughing up of blood.  No change in color of mucus.  No wheezing.  No chest wall deformity  Skin: no rash or lesions.  GU: no dysuria, change in color of urine, no urgency or frequency.  No flank pain, no hematuria   MS:  No joint pain or swelling.  No decreased range of motion.  No back pain.  Psych:  No change in mood or affect. No depression or anxiety.  No memory loss.   Vital Signs BP (!) 143/84 (BP Location: Left Arm, Patient Position: Sitting, Cuff Size: Normal)   Pulse 72   Ht 6' (1.829 m)   Wt 142 lb (64.4 kg)   LMP 03/27/2011   SpO2 96%   BMI 19.26 kg/m    Physical Exam:  General- No distress,  A&Ox3, pleasant ENT: No sinus tenderness, TM clear, pale nasal mucosa, no oral exudate,no post nasal drip, no LAN Cardiac: S1, S2, regular rate and rhythm, no murmur Chest: No wheeze/ rales/ dullness; no accessory muscle use, no nasal flaring, no sternal retractions Abd.: Soft Non-tender, ND, BS +, Body mass index is 19.26 kg/m.  Ext: No clubbing cyanosis, edema, no obvious deformities Neuro:  normal strength, MAE x 4, A&O x 3 Skin: No rashes, warm and dry, no obvious lesions  Psych: normal mood and behavior   Assessment/Plan Abnormal lung cancer Screening CT Chest PET scan with low SUV of 1.4 Current every day smoker Pt would prefer to move forward with bronch for definitive diagnosis Plan It is good to see you  today. We have reviewed your PET scan . You have decided to move forward with a biopsy vs doing a 3 month follow up scan because you want to know what we are seeing on the scan.  I have placed an order for a bronchoscopy with biopsies.  We have discussed the procedure in detail.  We have reviewed the risks and benefits of the procedure. These include bleeding, infection, puncture of the lung, and adverse reaction to anesthesia. You have agreed to proceed with biopsy to evaluate the right upper lobe Your procedure will be done by Dr. Lamar Chris. You will receive a letter today with date time and information pertaining to the procedure. You will need someone to drive you to the procedure, stay with you during the procedure, and stay with you after the procedure. You will also need someone to stay with you for 24 hours after anesthesia to ensure you have cleared and are doing well. You will follow-up with me 1 week after the procedure to review the results and to ensure you are doing well. We will give you some Trelegy samples today. We will have you fill out the financial aid paperwork.  Call if you need us  prior to the procedure or if you have any questions at all. Please contact office for sooner follow up if symptoms do not improve or worsen or seek emergency care    You can receive free nicotine  replacement therapy (patches, gum, or mints) by calling 1-800-QUIT NOW. Please call so we can get you on  the path to becoming a non-smoker. I know it is hard, but you can do this!  Hypnosis for smoking cessation  [employer] [phone]  Acupuncture for smoking cessation  [employer] [phone]    I spent 30 minutes dedicated to the care of this patient on the date of this encounter to include pre-visit review of records, face-to-face time with the patient discussing conditions above, post visit ordering of testing, clinical documentation with the electronic health  record, making appropriate referrals as documented, and communicating necessary information to the patient's healthcare team.    Lauraine JULIANNA Lites, NP 08/06/2023  9:47 AM

## 2023-08-06 NOTE — Patient Instructions (Addendum)
 It is good to see you today. We have reviewed your PET scan . You have decided to move forward with a biopsy vs doing a 3 month follow up scan because you want to know what we are seeing on the scan.  I have placed an order for a bronchoscopy with biopsies.  We have discussed the procedure in detail.  We have reviewed the risks and benefits of the procedure. These include bleeding, infection, puncture of the lung, and adverse reaction to anesthesia. You have agreed to proceed with biopsy to evaluate the right upper lobe Your procedure will be done by Dr. Lamar Chris. You will receive a letter today with date time and information pertaining to the procedure. You will need someone to drive you to the procedure, stay with you during the procedure, and stay with you after the procedure. You will also need someone to stay with you for 24 hours after anesthesia to ensure you have cleared and are doing well. You will follow-up with me 1 week after the procedure to review the results and to ensure you are doing well. We will give you some Trelegy samples today. We will have you fill out the financial aid paperwork.  Call if you need us  prior to the procedure or if you have any questions at all. Please contact office for sooner follow up if symptoms do not improve or worsen or seek emergency care

## 2023-08-06 NOTE — Telephone Encounter (Signed)
 Please schedule the following:  Provider performing procedure: [facility] Diagnosis:  Lung nodule Which side if for nodule / mass? Right  Procedure:  Navigational bronchoscopy with biopsies  Has patient been spoken to by Provider and given informed consent?  Yes, to Mikel Pyon NP on 08/06/2023 Anesthesia:  General Do you need Fluro?  Yes Duration of procedure: 1.5 hr Date: 08/19/2023 Alternate Date: 7/8  Time: Prefers am Location: [facility]  Does patient have OSA?  No DM?  No Or Latex allergy?  No Medication Restriction/ Anticoagulate/Antiplatelet:  No blood thinners or aspitin, no diabetic meds Pre-op Labs Ordered:determined by Anesthesia Imaging request: Recent LDCT  (If, [facility] CT Chest, please have STAT courier sent to ENDO)

## 2023-08-06 NOTE — Telephone Encounter (Signed)
 Letter given by the nurse Case# [id number]

## 2023-08-07 ENCOUNTER — Telehealth: Payer: Self-pay

## 2023-08-07 NOTE — Telephone Encounter (Signed)
 Sent patient mychart message in regards to trelegy financial assistance paperwork

## 2023-08-14 ENCOUNTER — Other Ambulatory Visit: Payer: Self-pay

## 2023-08-14 ENCOUNTER — Encounter (HOSPITAL_COMMUNITY): Payer: Self-pay | Admitting: Emergency Medicine

## 2023-08-14 DIAGNOSIS — Z113 Encounter for screening for infections with a predominantly sexual mode of transmission: Secondary | ICD-10-CM

## 2023-08-14 DIAGNOSIS — B20 Human immunodeficiency virus [HIV] disease: Secondary | ICD-10-CM

## 2023-08-14 NOTE — Progress Notes (Signed)
 SDW CALL  Patient was given pre-op instructions over the phone. The opportunity was given for the patient to ask questions. No further questions asked. Patient verbalized understanding of instructions given.   PCP - Bascom Henry Cardiologist - denies - did see cardiology for chest pain in September of 2024 but has not any chest pain recently Infectious Disease - Dr. Montie Bologna  PPM/ICD - denies   Chest CT - 07/13/23 EKG - 11/09/22 Stress Test - denies ECHO - 11/09/22 Cardiac Cath - denies  Sleep Study - denies  No DM  Last dose of GLP1 agonist-  n/a GLP1 instructions:  n/a  Blood Thinner Instructions: n/a Aspirin Instructions: n/a  ERAS Protcol - clears until 0945   COVID TEST- n/a   Anesthesia review: yes - heart murmur  Patient denies shortness of breath, fever, cough and chest pain over the phone call   All instructions explained to the patient, with a verbal understanding of the material. Patient agrees to go over the instructions while at home for a better understanding.

## 2023-08-15 NOTE — Progress Notes (Signed)
 Anesthesia Chart Review: SAME DAY WORK-UP  Case: [id number] Date/Time: 08/19/23 1245   Procedure: VIDEO BRONCHOSCOPY WITH ENDOBRONCHIAL NAVIGATION (Right)   Anesthesia type: General   Diagnosis: Lung nodule seen on imaging study [R91.1]   Pre-op diagnosis: lung nodule   [facility] / [facility]   Surgeons: Shelah Lamar RAMAN, MD       DISCUSSION: Patient is a 63 year old female scheduled for the above procedure. RUL lung nodule noted to have increased in size on most recent [facility] Chest CT. Patient preferred bronchoscopy with biopsy for diagnosis over watchful waiting.  Other history includes smoking, HTN, murmur, prior substance abuse (clean for 10 years; drinks 5 beers/week), HIV (diagnosed > 20 years ago), hepatitis C (s/p treatment), appendectomy (07/20/18) . Pancreatectomy was added to history in 2020 but recent CT and PET imaging show an unremarkable pancreas but prior cholecystectomy.  She was admitted overnight on 11/08/22 for chest pain (sharp, intermittent) and SOB. She had dynamic EKG changes while in the ED, with development of 1 mm ST depression/T wave inversions in inferior leads. HS Troponin normal x2. Cardiologist Kate Bruckner, MD was consulted. CCTA and echo ordered. TTE with LVEF 60-65%, no RWMA, trivial MR, normal RVSP. CCTA showed calcium  score of 0 and no significant CAD appreciated.     Anesthesia team to evaluate on the day of surgery. Will defer to anesthesiologist if updated EKG desired.    VS: LMP 03/27/2011  Wt Readings from Last 3 Encounters:  08/06/23 64.4 kg  06/18/23 65.5 kg  05/17/23 64 kg   BP Readings from Last 3 Encounters:  08/06/23 (!) 143/84  06/18/23 130/74  05/17/23 116/77   Pulse Readings from Last 3 Encounters:  08/06/23 72  06/18/23 78  05/17/23 84    PROVIDERS: Oley Bascom RAMAN, NP is PCP  Luiz Channel, MD is ID. Last visit 03/07/23. CD4 count is 486, improved from summer. Viral load undetectable. Kidney  function normal. No issues with current medication (Biktarvy ). Treated Hepatitis C but recommended liver US  due to liver cancer risk. Noted following up with pulmonology for lung nodule. Six month ID follow-up was recommended. Scheduled for 09/04/23.   LABS: For day of surgery as indicated. Most recent results in [facility] include: Lab Results  Component Value Date   WBC 3.6 (L) 02/21/2023   HGB 12.4 02/21/2023   HCT 37.9 02/21/2023   PLT 183 02/21/2023   GLUCOSE 73 02/21/2023   CHOL 158 02/21/2023   TRIG 54 02/21/2023   HDL 85 02/21/2023   LDLCALC 60 02/21/2023   ALT 12 02/21/2023   AST 21 02/21/2023   NA 140 02/21/2023   K 3.9 02/21/2023   CL 103 02/21/2023   CREATININE 1.18 (H) 02/21/2023   BUN 15 02/21/2023   CO2 27 02/21/2023   TSH 1.140 06/18/2023   INR 0.9 11/08/2022   HGBA1C 4.6 (L) 11/08/2022    PFTs 04/18/22: FVC 2.69 (62%), post 3.19 (73%). FEV1 1.66 (49%), post 1.14 (34%). DLCO unc/cor 0.69 (2%).  INTERPRETATION: Good Technique. Reduced FEV1, suspect underlying mixed pattern with normal ration. No significant bronchodilator response. Unable to perform DLCO. Lung volumes with air trapping and hyperinflation present.    IMAGES: PET Scan 08/02/23: IMPRESSION: - Right upper lobe nodularity has no significant abnormal uptake. The recommend short-term CT surveillance in 3 months. If there is more clinical concern tissue sampling could be considered. - No soft tissue or nodal areas of abnormal uptake otherwise. - Stable areas of increased density along  the mid to lower aspect the left kidney, similar to previous. Recommend dedicated pre and post contrast examination when appropriate with either CT or MRI.  CT Chest 06/17/23: IMPRESSION: - Lung-RADS 4B, suspicious. Chest CT with or without contrast, PET/CT and/or tissue sampling depending on the probability of malignancy and comorbidities. PET/CT may be used when there is a = 8 mm (= 268 mm) solid component. For new large  nodules that develop on an annual repeat screening CT, a 1 month LDCT may be recommended to address potentially infectious or inflammatory conditions Additional imaging evaluation or consultation with Pulmonology or Thoracic Surgery recommended. - Enlarging pulmonary nodule within the posterior right apex. - Mild emphysema.  Associated airway inflammation. - Aortic Atherosclerosis (ICD10-I70.0) and Emphysema (ICD10-J43.9).    EKG: EKG 11/08/22 15:58: Sinus rhythm LAE, consider biatrial enlargement IVCD, consider atypical RBBB ST elevation suggests acute pericarditis No significant change since last tracing Confirmed by Emil Share [phone]) on 11/09/2022 10:16:43 AM  EKG 11/08/22 14:14: Sinus rhythm LAE, consider biatrial enlargement Right ventricular hypertrophy Lateral infarct, age indeterminate Confirmed by Levander Houston [phone]) on 11/08/2022 3:50:00 PM   CV: Echo 11/09/22: IMPRESSIONS   1. Left ventricular ejection fraction, by estimation, is 60 to 65%. The  left ventricle has normal function. The left ventricle has no regional  wall motion abnormalities. Left ventricular diastolic parameters are  indeterminate.   2. Right ventricular systolic function is normal. The right ventricular  size is normal. There is normal pulmonary artery systolic pressure.   3. The mitral valve is normal in structure. Trivial mitral valve  regurgitation. No evidence of mitral stenosis.   4. The aortic valve is tricuspid. Aortic valve regurgitation is not  visualized. No aortic stenosis is present.   5. The inferior vena cava is dilated in size with <50% respiratory  variability, suggesting right atrial pressure of 15 mmHg.   CTA Coronary 11/08/22: IMPRESSION: 1. Coronary calcium  score of 0. This was <1 percentile for age and sex matched control. 2. Total plaque volume (TPV) 7 mm3 which is percentile for age-and sex matched controls (calcified plaque 0 mm3; non-calcified plaque 7 mm3). TPV is  mild. 3. Normal coronary origin with right dominance. 4. Minimal non obstructive soft plaque in proximal LAD. Non flow limiting CAD. Aggressive risk factor modification.    Past Medical History:  Diagnosis Date   Anxiety 11/2018   Asthma    Heart murmur    Hepatitis C    Has been treated   HIV (human immunodeficiency virus infection) (HCC)    Hypertension    STD (sexually transmitted disease)    Substance abuse (HCC)    Underweight 07/29/2014    Past Surgical History:  Procedure Laterality Date   BUNIONECTOMY Left 03/15/2020   COLONOSCOPY WITH PROPOFOL  N/A 06/26/2016   Procedure: COLONOSCOPY WITH PROPOFOL ;  Surgeon: Elicia Claw, MD;  Location: [facility];  Service: Gastroenterology;  Laterality: N/A;   fractured wrist Left    LAPAROSCOPIC APPENDECTOMY N/A 07/20/2018   Procedure: APPENDECTOMY LAPAROSCOPIC;  Surgeon: Signe Mitzie LABOR, MD;  Location: [facility] OR;  Service: General;  Laterality: N/A;   PANCREATECTOMY      MEDICATIONS: No current facility-administered medications for this encounter.    acetaminophen  (TYLENOL ) 500 MG tablet   amLODipine  (NORVASC ) 5 MG tablet   azithromycin  (ZITHROMAX  Z-PAK) 250 MG tablet   benzonatate  (TESSALON ) 100 MG capsule   bictegravir-emtricitabine -tenofovir  AF (BIKTARVY ) 50-200-25 MG TABS tablet   Biotin 5000 MCG TABS   Boric Acid Vaginal 600 MG  SUPP   buPROPion  (WELLBUTRIN  SR) 150 MG 12 hr tablet   cetirizine  (ZYRTEC ) 10 MG tablet   conjugated estrogens  (PREMARIN ) vaginal cream   fluticasone  (FLONASE ) 50 MCG/ACT nasal spray   Fluticasone -Umeclidin-Vilant (TRELEGY ELLIPTA ) 100-62.5-25 MCG/ACT AEPB   Fluticasone -Umeclidin-Vilant (TRELEGY ELLIPTA ) 100-62.5-25 MCG/ACT AEPB   Fluticasone -Umeclidin-Vilant (TRELEGY ELLIPTA ) 100-62.5-25 MCG/ACT AEPB   methylPREDNISolone  (MEDROL  DOSEPAK) 4 MG TBPK tablet   mirabegron  ER (MYRBETRIQ ) 25 MG TB24 tablet   Multiple Vitamins-Minerals (MULTIVITAMIN GUMMIES ADULTS PO)   nicotine  (NICODERM CQ  -  DOSED IN MG/24 HOURS) 14 mg/24hr patch   Omega-3 Fatty Acids (FISH OIL) 1000 MG CAPS   omeprazole  (PRILOSEC) 20 MG capsule   rosuvastatin  (CRESTOR ) 5 MG tablet   solifenacin  (VESICARE ) 10 MG tablet   TUMS E-X 750 750 MG chewable tablet  Not currently taking azithromycin , boric acid vaginal supp, Medrol  Dose pack, Nicoderm    Romain Erion, PA-C Surgical Short Stay/Anesthesiology [facility] Phone [phone] [facility] Phone [phone] 08/15/2023 9:59 AM

## 2023-08-15 NOTE — Anesthesia Preprocedure Evaluation (Addendum)
 Anesthesia Evaluation  Patient identified by MRN, date of birth, ID band Patient awake    Reviewed: Allergy & Precautions, NPO status , Patient's Chart, lab work & pertinent test results, reviewed documented beta blocker date and time   History of Anesthesia Complications Negative for: history of anesthetic complications  Airway Mallampati: I  TM Distance: >3 FB     Dental no notable dental hx.    Pulmonary asthma , Current Smoker and Patient abstained from smoking.   breath sounds clear to auscultation       Cardiovascular hypertension, (-) CAD, (-) Past MI, (-) Cardiac Stents and (-) CABG + Valvular Problems/Murmurs  Rhythm:Regular Rate:Normal  IMPRESSIONS     1. Left ventricular ejection fraction, by estimation, is 60 to 65%. The  left ventricle has normal function. The left ventricle has no regional  wall motion abnormalities. Left ventricular diastolic parameters are  indeterminate.   2. Right ventricular systolic function is normal. The right ventricular  size is normal. There is normal pulmonary artery systolic pressure.   3. The mitral valve is normal in structure. Trivial mitral valve  regurgitation. No evidence of mitral stenosis.   4. The aortic valve is tricuspid. Aortic valve regurgitation is not  visualized. No aortic stenosis is present.   5. The inferior vena cava is dilated in size with <50% respiratory  variability, suggesting right atrial pressure of 15 mmHg.      Neuro/Psych  Headaches PSYCHIATRIC DISORDERS Anxiety Depression     Neuromuscular disease    GI/Hepatic ,GERD  ,,(+) Hepatitis -, C  Endo/Other    Renal/GU Renal disease     Musculoskeletal   Abdominal   Peds  Hematology  (+) Blood dyscrasia, anemia   Anesthesia Other Findings   Reproductive/Obstetrics                              Anesthesia Physical Anesthesia Plan  ASA: 2  Anesthesia Plan: General    Post-op Pain Management:    Induction: Intravenous  PONV Risk Score and Plan: 2 and Ondansetron  and Dexamethasone   Airway Management Planned: Oral ETT  Additional Equipment:   Intra-op Plan:   Post-operative Plan: Extubation in OR  Informed Consent: I have reviewed the patients History and Physical, chart, labs and discussed the procedure including the risks, benefits and alternatives for the proposed anesthesia with the patient or authorized representative who has indicated his/her understanding and acceptance.     Dental advisory given  Plan Discussed with: CRNA  Anesthesia Plan Comments: (PAT note written 08/15/2023 by Allison Zelenak, PA-C.  )         Anesthesia Quick Evaluation

## 2023-08-19 ENCOUNTER — Ambulatory Visit (HOSPITAL_COMMUNITY)
Admission: RE | Admit: 2023-08-19 | Discharge: 2023-08-19 | Disposition: A | Discharge status: Home / Self Care | Source: Ambulatory Visit | Attending: Emergency Medicine | Admitting: Emergency Medicine

## 2023-08-19 ENCOUNTER — Ambulatory Visit (HOSPITAL_COMMUNITY): Payer: Self-pay | Admitting: Vascular Surgery

## 2023-08-19 ENCOUNTER — Encounter (HOSPITAL_COMMUNITY): Payer: Self-pay | Admitting: Emergency Medicine

## 2023-08-19 ENCOUNTER — Encounter (HOSPITAL_COMMUNITY)
Admission: RE | Disposition: A | Discharge status: Home / Self Care | Payer: Self-pay | Source: Ambulatory Visit | Attending: Emergency Medicine

## 2023-08-19 ENCOUNTER — Ambulatory Visit (HOSPITAL_COMMUNITY)

## 2023-08-19 DIAGNOSIS — I10 Essential (primary) hypertension: Secondary | ICD-10-CM | POA: Insufficient documentation

## 2023-08-19 DIAGNOSIS — F17200 Nicotine dependence, unspecified, uncomplicated: Secondary | ICD-10-CM | POA: Diagnosis not present

## 2023-08-19 DIAGNOSIS — J4489 Other specified chronic obstructive pulmonary disease: Secondary | ICD-10-CM | POA: Insufficient documentation

## 2023-08-19 DIAGNOSIS — B1920 Unspecified viral hepatitis C without hepatic coma: Secondary | ICD-10-CM | POA: Diagnosis not present

## 2023-08-19 DIAGNOSIS — R911 Solitary pulmonary nodule: Secondary | ICD-10-CM | POA: Diagnosis present

## 2023-08-19 DIAGNOSIS — B20 Human immunodeficiency virus [HIV] disease: Secondary | ICD-10-CM

## 2023-08-19 DIAGNOSIS — J0110 Acute frontal sinusitis, unspecified: Secondary | ICD-10-CM

## 2023-08-19 DIAGNOSIS — Z21 Asymptomatic human immunodeficiency virus [HIV] infection status: Secondary | ICD-10-CM | POA: Diagnosis not present

## 2023-08-19 HISTORY — PX: BRONCHIAL NEEDLE ASPIRATION BIOPSY: SHX5106

## 2023-08-19 HISTORY — PX: BRONCHIAL BRUSHINGS: SHX5108

## 2023-08-19 HISTORY — DX: Cardiac murmur, unspecified: R01.1

## 2023-08-19 HISTORY — PX: BRONCHIAL BIOPSY: SHX5109

## 2023-08-19 HISTORY — PX: BRONCHIAL WASHINGS: SHX5105

## 2023-08-19 HISTORY — PX: VIDEO BRONCHOSCOPY WITH ENDOBRONCHIAL NAVIGATION: SHX6175

## 2023-08-19 LAB — COMPREHENSIVE METABOLIC PANEL WITH GFR
ALT: 20 U/L (ref 0–44)
AST: 25 U/L (ref 15–41)
Albumin: 4.1 g/dL (ref 3.5–5.0)
Alkaline Phosphatase: 115 U/L (ref 38–126)
Anion gap: 9 (ref 5–15)
BUN: 17 mg/dL (ref 8–23)
CO2: 25 mmol/L (ref 22–32)
Calcium: 9.4 mg/dL (ref 8.9–10.3)
Chloride: 106 mmol/L (ref 98–111)
Creatinine, Ser: 1.03 mg/dL — ABNORMAL HIGH (ref 0.44–1.00)
GFR, Estimated: >60 mL/min (ref >60)
Glucose, Bld: 97 mg/dL (ref 70–99)
Potassium: 4.1 mmol/L (ref 3.5–5.1)
Sodium: 140 mmol/L (ref 135–145)
Total Bilirubin: 0.7 mg/dL (ref 0.0–1.2)
Total Protein: 8.1 g/dL (ref 6.5–8.1)

## 2023-08-19 LAB — CBC
HCT: 43.7 % (ref 36.0–46.0)
Hemoglobin: 13.7 g/dL (ref 12.0–15.0)
MCH: 30 pg (ref 26.0–34.0)
MCHC: 31.4 g/dL (ref 30.0–36.0)
MCV: 95.6 fL (ref 80.0–100.0)
Platelets: 203 K/uL (ref 150–400)
RBC: 4.57 MIL/uL (ref 3.87–5.11)
RDW: 12.5 % (ref 11.5–15.5)
WBC: 4.3 K/uL (ref 4.0–10.5)
nRBC: 0 % (ref 0.0–0.2)

## 2023-08-19 SURGERY — VIDEO BRONCHOSCOPY WITH ENDOBRONCHIAL NAVIGATION
Anesthesia: General | Laterality: Right

## 2023-08-19 MED ORDER — ESTROGENS CONJUGATED 0.625 MG/GM VA CREA: 0.5000 g | TOPICAL_CREAM | VAGINAL | Status: AC

## 2023-08-19 MED ORDER — ONDANSETRON HCL 4 MG/2ML IJ SOLN: 4.0000 mg | Freq: Once | INTRAMUSCULAR | Status: DC | PRN

## 2023-08-19 MED ORDER — CHLORHEXIDINE GLUCONATE 0.12 % MT SOLN
15.0000 mL | OROMUCOSAL | Status: AC
  Administered 2023-08-19: 15 mL via OROMUCOSAL
  Filled 2023-08-19: qty 15

## 2023-08-19 MED ORDER — ROCURONIUM BROMIDE 10 MG/ML (PF) SYRINGE
PREFILLED_SYRINGE | INTRAVENOUS | Status: DC | PRN
  Administered 2023-08-19: 40 mg via INTRAVENOUS
  Administered 2023-08-19: 20 mg via INTRAVENOUS

## 2023-08-19 MED ORDER — ACETAMINOPHEN 10 MG/ML IV SOLN: 1000.0000 mg | Freq: Once | INTRAVENOUS | Status: DC | PRN

## 2023-08-19 MED ORDER — FLUTICASONE PROPIONATE 50 MCG/ACT NA SUSP: 2.0000 | Freq: Every day | NASAL | Status: DC | PRN

## 2023-08-19 MED ORDER — ONDANSETRON HCL 4 MG/2ML IJ SOLN
INTRAMUSCULAR | Status: DC | PRN
  Administered 2023-08-19: 4 mg via INTRAVENOUS

## 2023-08-19 MED ORDER — SUGAMMADEX SODIUM 200 MG/2ML IV SOLN
INTRAVENOUS | Status: DC | PRN
Start: 2023-08-19 — End: 2023-08-19
  Administered 2023-08-19: 150 mg via INTRAVENOUS

## 2023-08-19 MED ORDER — DEXAMETHASONE SODIUM PHOSPHATE 10 MG/ML IJ SOLN
INTRAMUSCULAR | Status: DC | PRN
  Administered 2023-08-19: 10 mg via INTRAVENOUS

## 2023-08-19 MED ORDER — FENTANYL CITRATE (PF) 100 MCG/2ML IJ SOLN: 25.0000 ug | INTRAMUSCULAR | Status: DC | PRN

## 2023-08-19 MED ORDER — ACETAMINOPHEN 500 MG PO TABS
1000.0000 mg | ORAL_TABLET | Freq: Four times a day (QID) | ORAL | Status: AC | PRN
Start: 2023-08-19 — End: ?

## 2023-08-19 MED ORDER — FENTANYL CITRATE (PF) 100 MCG/2ML IJ SOLN
INTRAMUSCULAR | Status: AC
  Filled 2023-08-19: qty 2

## 2023-08-19 MED ORDER — OXYCODONE HCL 5 MG PO TABS: 5.0000 mg | ORAL_TABLET | Freq: Once | ORAL | Status: DC | PRN

## 2023-08-19 MED ORDER — PROPOFOL 10 MG/ML IV BOLUS
INTRAVENOUS | Status: DC | PRN
  Administered 2023-08-19: 120 mg via INTRAVENOUS

## 2023-08-19 MED ORDER — OXYCODONE HCL 5 MG/5ML PO SOLN: 5.0000 mg | Freq: Once | ORAL | Status: DC | PRN

## 2023-08-19 MED ORDER — SALINE SPRAY 0.65 % NA SOLN
1.0000 | NASAL | Status: DC | PRN
  Administered 2023-08-19: 1 via NASAL
  Filled 2023-08-19: qty 44

## 2023-08-19 MED ORDER — LACTATED RINGERS IV SOLN: INTRAVENOUS | Status: DC

## 2023-08-19 MED ORDER — PROPOFOL 500 MG/50ML IV EMUL
INTRAVENOUS | Status: DC | PRN
  Administered 2023-08-19: 115 ug/kg/min via INTRAVENOUS

## 2023-08-19 MED ORDER — FENTANYL CITRATE (PF) 250 MCG/5ML IJ SOLN
INTRAMUSCULAR | Status: DC | PRN
  Administered 2023-08-19 (×2): 50 ug via INTRAVENOUS

## 2023-08-19 MED ORDER — LIDOCAINE 2% (20 MG/ML) 5 ML SYRINGE
INTRAMUSCULAR | Status: DC | PRN
  Administered 2023-08-19: 100 mg via INTRAVENOUS

## 2023-08-19 MED ORDER — BUPROPION HCL ER (SR) 150 MG PO TB12: 150.0000 mg | ORAL_TABLET | Freq: Every day | ORAL | Status: AC

## 2023-08-19 SURGICAL SUPPLY — 36 items
ADAPTER BRONCHOSCOPE OLYMPUS (ADAPTER) ×2 IMPLANT
ADAPTER VALVE BIOPSY EBUS (MISCELLANEOUS) IMPLANT
BAG COUNTER SPONGE SURGICOUNT (BAG) ×2 IMPLANT
BRUSH CYTOL CELLEBRITY 1.5X140 (MISCELLANEOUS) ×2 IMPLANT
BRUSH SUPERTRAX BIOPSY (INSTRUMENTS) IMPLANT
BRUSH SUPERTRAX NDL-TIP CYTO (INSTRUMENTS) ×2 IMPLANT
CANISTER SUCTION 3000ML PPV (SUCTIONS) ×2 IMPLANT
CNTNR URN SCR LID CUP LEK RST (MISCELLANEOUS) ×2 IMPLANT
COVER BACK TABLE 60X90IN (DRAPES) ×2 IMPLANT
FILTER STRAW FLUID ASPIR (MISCELLANEOUS) IMPLANT
FORCEPS BIOP 1.5 SINGLE USE (MISCELLANEOUS) ×2 IMPLANT
FORCEPS BIOP SUPERTRX PREMAR (INSTRUMENTS) ×2 IMPLANT
GAUZE SPONGE 4X4 12PLY STRL (GAUZE/BANDAGES/DRESSINGS) ×2 IMPLANT
GLOVE BIO SURGEON STRL SZ7.5 (GLOVE) ×4 IMPLANT
GOWN STRL REUS W/ TWL LRG LVL3 (GOWN DISPOSABLE) ×4 IMPLANT
KIT CLEAN ENDO COMPLIANCE (KITS) ×2 IMPLANT
KIT LOCATABLE GUIDE (CANNULA) IMPLANT
KIT MARKER FIDUCIAL DELIVERY (KITS) IMPLANT
KIT TURNOVER KIT B (KITS) ×2 IMPLANT
MARKER SKIN DUAL TIP RULER LAB (MISCELLANEOUS) ×2 IMPLANT
NDL SUPERTRX PREMARK BIOPSY (NEEDLE) ×2 IMPLANT
NEEDLE SUPERTRX PREMARK BIOPSY (NEEDLE) ×2 IMPLANT
NS IRRIG 1000ML POUR BTL (IV SOLUTION) ×2 IMPLANT
OIL SILICONE PENTAX (PARTS (SERVICE/REPAIRS)) ×2 IMPLANT
PAD ARMBOARD POSITIONER FOAM (MISCELLANEOUS) ×4 IMPLANT
PATCHES PATIENT (LABEL) ×6 IMPLANT
SYR 20ML ECCENTRIC (SYRINGE) ×2 IMPLANT
SYR 20ML LL LF (SYRINGE) ×2 IMPLANT
SYR 50ML SLIP (SYRINGE) ×2 IMPLANT
TOWEL GREEN STERILE FF (TOWEL DISPOSABLE) ×2 IMPLANT
TRAP SPECIMEN MUCUS 40CC (MISCELLANEOUS) IMPLANT
TUBE CONNECTING 20X1/4 (TUBING) ×2 IMPLANT
UNDERPAD 30X36 HEAVY ABSORB (UNDERPADS AND DIAPERS) ×2 IMPLANT
VALVE BIOPSY SINGLE USE (MISCELLANEOUS) ×2 IMPLANT
VALVE SUCTION BRONCHIO DISP (MISCELLANEOUS) ×2 IMPLANT
WATER STERILE IRR 1000ML POUR (IV SOLUTION) ×2 IMPLANT

## 2023-08-19 NOTE — Op Note (Signed)
 Procedure Note  Patient: [name]  [employer] Cios mobile C-arm was utilized to identify and biopsy right upper lobe pulmonary nodule.  Needle-in-lesion was confirmed using real-time Cios imaging, and images were uploaded to PACS.   Lamar Chris, MD, PhD 08/19/2023, 1:38 PM [facility] [phone] or if no answer before 7:00PM call [phone] For any issues after 7:00PM please call eLink [phone]

## 2023-08-19 NOTE — Anesthesia Procedure Notes (Signed)
 Procedure Name: Intubation Date/Time: 08/19/2023 12:49 PM  Performed by: Shlomo Tinnie SAILOR, RNPre-anesthesia Checklist: Patient identified, Emergency Drugs available, Suction available and Patient being monitored Patient Re-evaluated:Patient Re-evaluated prior to induction Oxygen Delivery Method: Circle System Utilized Preoxygenation: Pre-oxygenation with 100% oxygen Induction Type: IV induction Ventilation: Mask ventilation without difficulty Laryngoscope Size: Mac and 3 Grade View: Grade I Tube type: Oral Tube size: 8.5 mm Number of attempts: 1 Airway Equipment and Method: Stylet and Oral airway Placement Confirmation: ETT inserted through vocal cords under direct vision, positive ETCO2 and breath sounds checked- equal and bilateral Secured at: 23 cm Tube secured with: Tape Dental Injury: Teeth and Oropharynx as per pre-operative assessment

## 2023-08-19 NOTE — Interval H&P Note (Signed)
 History and Physical Interval Note:  08/19/2023 10:53 AM  [name]  has presented today for surgery, with the diagnosis of lung nodule.  The various methods of treatment have been discussed with the patient and family. After consideration of risks, benefits and other options for treatment, the patient has consented to  Procedure(s): VIDEO BRONCHOSCOPY WITH ENDOBRONCHIAL NAVIGATION (Right) as a surgical intervention.  The patient's history has been reviewed, patient examined, no change in status, stable for surgery.  I have reviewed the patient's chart and labs.  Questions were answered to the patient's satisfaction.     Lamar GORMAN Chris

## 2023-08-19 NOTE — Op Note (Signed)
 Video Bronchoscopy with Robotic Assisted Bronchoscopic Navigation   Date of Operation: 08/19/2023   Pre-op Diagnosis: Right upper lobe pulmonary nodule  Post-op Diagnosis: Same  Surgeon: Lamar Chris  Assistants: None  Anesthesia: General endotracheal anesthesia  Operation: Flexible video fiberoptic bronchoscopy with robotic assistance and biopsies.  Estimated Blood Loss: Minimal  Complications: None  Indications and History: [name] is a 63 y.o. female with history of tobacco use COPD lung cancer screening.  She has a posterior right upper lobe pulmonary nodule that has increased in size on serial imaging, is negative on PET scan.  Recommendation made to achieve a tissue diagnosis via robotic assisted navigational bronchoscopy.  The risks, benefits, complications, treatment options and expected outcomes were discussed with the patient.  The possibilities of pneumothorax, pneumonia, reaction to medication, pulmonary aspiration, perforation of a viscus, bleeding, failure to diagnose a condition and creating a complication requiring transfusion or operation were discussed with the patient who freely signed the consent.    Description of Procedure: The patient was seen in the Preoperative Area, was examined and was deemed appropriate to proceed.  The patient was taken to [facility] room 3, identified as [name] and the procedure verified as Flexible Video Fiberoptic Bronchoscopy.  A Time Out was held and the above information confirmed.   Prior to the date of the procedure a high-resolution CT scan of the chest was performed. Utilizing ION software program a virtual tracheobronchial tree was generated to allow the creation of distinct navigation pathways to the patient's parenchymal abnormalities. After being taken to the operating room general anesthesia was initiated and the patient  was orally intubated. The video fiberoptic bronchoscope was introduced via the  endotracheal tube and a general inspection was performed which showed normal right and left lung anatomy. Aspiration of the bilateral mainstems was completed to remove any remaining secretions. Robotic catheter inserted into patient's endotracheal tube.   Target #1 right upper lobe pulmonary nodule: The distinct navigation pathways prepared prior to this procedure were then utilized to navigate to patient's lesion identified on CT scan. The robotic catheter was secured into place and the vision probe was withdrawn.  Lesion location was approximated using fluoroscopy.  Local registration and targeting was performed using [employer] Cios mobile C-arm three-dimensional imaging. Under fluoroscopic guidance transbronchial needle brushings, transbronchial needle biopsies, and transbronchial forceps biopsies were performed to be sent for cytology and pathology.  Needle-in-lesion was confirmed using Cios mobile C-arm.  A bronchioalveolar lavage was performed in the right upper lobe and sent for cytology and microbiology.     At the end of the procedure a general airway inspection was performed and there was no evidence of active bleeding. The bronchoscope was removed.  The patient tolerated the procedure well. There was no significant blood loss and there were no obvious complications. A post-procedural chest x-ray is pending.  Samples Target #1: 1. Transbronchial needle brushings from right upper lobe nodule 2. Transbronchial Wang needle biopsies from right upper lobe nodule 3. Transbronchial forceps biopsies from right upper lobe nodule 4. Bronchoalveolar lavage from right upper lobe    Plans:  The patient will be discharged from the PACU to home when recovered from anesthesia and after chest x-ray is reviewed. We will review the cytology, pathology and microbiology results with the patient when they become available. Outpatient followup will be with GORMAN Lites, NP and Dr. Chris.    Lamar Chris, MD, PhD 08/19/2023, 1:34 PM [facility]  [phone] or if no answer before 7:00PM call [phone] For any issues after 7:00PM please call eLink [phone]

## 2023-08-19 NOTE — Transfer of Care (Signed)
 Immediate Anesthesia Transfer of Care Note  Patient: [name]  Procedure(s) Performed: VIDEO BRONCHOSCOPY WITH ENDOBRONCHIAL NAVIGATION (Right) BRONCHOSCOPY, WITH NEEDLE ASPIRATION BIOPSY BRONCHOSCOPY, WITH BIOPSY BRONCHOSCOPY, WITH BRUSH BIOPSY IRRIGATION, BRONCHUS  Patient Location: PACU  Anesthesia Type:General  Level of Consciousness: awake, alert , and oriented  Airway & Oxygen Therapy: Patient Spontanous Breathing and Patient connected to face mask oxygen  Post-op Assessment: Report given to RN and Post -op Vital signs reviewed and stable  Post vital signs: Reviewed and stable  Last Vitals:  Vitals Value Taken Time  BP 135/78 08/19/23 13:45  Temp    Pulse 76 08/19/23 13:47  Resp 12 08/19/23 13:47  SpO2 94 % 08/19/23 13:47  Vitals shown include unfiled device data.  Last Pain:  Vitals:   08/19/23 1012  TempSrc:   PainSc: 0-No pain      Patients Stated Pain Goal: 0 (08/19/23 1012)  Complications: No notable events documented.

## 2023-08-19 NOTE — Discharge Instructions (Addendum)

## 2023-08-20 ENCOUNTER — Encounter (HOSPITAL_COMMUNITY): Payer: Self-pay | Admitting: Emergency Medicine

## 2023-08-20 LAB — CYTOLOGY - NON PAP

## 2023-08-20 NOTE — Anesthesia Postprocedure Evaluation (Signed)
 Anesthesia Post Note  Patient: [name]  Procedure(s) Performed: VIDEO BRONCHOSCOPY WITH ENDOBRONCHIAL NAVIGATION (Right) BRONCHOSCOPY, WITH NEEDLE ASPIRATION BIOPSY BRONCHOSCOPY, WITH BIOPSY BRONCHOSCOPY, WITH BRUSH BIOPSY IRRIGATION, BRONCHUS     Patient location during evaluation: PACU Anesthesia Type: General Level of consciousness: awake and alert Pain management: pain level controlled Vital Signs Assessment: post-procedure vital signs reviewed and stable Respiratory status: spontaneous breathing, nonlabored ventilation, respiratory function stable and patient connected to nasal cannula oxygen Cardiovascular status: blood pressure returned to baseline and stable Postop Assessment: no apparent nausea or vomiting Anesthetic complications: no   No notable events documented.  Last Vitals:  Vitals:   08/19/23 1400 08/19/23 1410  BP: 129/76 138/83  Pulse: 73 69  Resp: 13 18  Temp:    SpO2: 98% 100%    Last Pain:  Vitals:   08/19/23 1400  TempSrc:   PainSc: 0-No pain                 [name] Cornea

## 2023-08-21 ENCOUNTER — Other Ambulatory Visit: Payer: BC Managed Care – PPO

## 2023-08-21 LAB — CULTURE, BAL-QUANTITATIVE W GRAM STAIN
Culture: NO GROWTH
Gram Stain: NONE SEEN

## 2023-08-21 LAB — ACID FAST SMEAR (AFB, MYCOBACTERIA): Acid Fast Smear: NEGATIVE

## 2023-08-24 LAB — AEROBIC/ANAEROBIC CULTURE W GRAM STAIN (SURGICAL/DEEP WOUND)
Culture: NO GROWTH
Gram Stain: NONE SEEN

## 2023-08-26 ENCOUNTER — Ambulatory Visit (INDEPENDENT_AMBULATORY_CARE_PROVIDER_SITE_OTHER): Admitting: Acute Care

## 2023-08-26 ENCOUNTER — Encounter: Payer: Self-pay | Admitting: Acute Care

## 2023-08-26 VITALS — BP 152/87 | HR 72 | Temp 97.9°F | Ht 70.0 in | Wt 143.6 lb

## 2023-08-26 DIAGNOSIS — F17200 Nicotine dependence, unspecified, uncomplicated: Secondary | ICD-10-CM

## 2023-08-26 DIAGNOSIS — R911 Solitary pulmonary nodule: Secondary | ICD-10-CM

## 2023-08-26 DIAGNOSIS — F17210 Nicotine dependence, cigarettes, uncomplicated: Secondary | ICD-10-CM | POA: Diagnosis not present

## 2023-08-26 LAB — CYTOLOGY - NON PAP

## 2023-08-26 MED ORDER — TRELEGY ELLIPTA 100-62.5-25 MCG/ACT IN AEPB
1.0000 | INHALATION_SPRAY | Freq: Every day | RESPIRATORY_TRACT | 3 refills | Status: AC
Start: 2023-08-26 — End: ?

## 2023-08-26 NOTE — Patient Instructions (Addendum)
 It is good to see you today. I am glad you have done well after the biopsies. Two of your biopsy results are back, and do not show any malignant cells.  This is good news. We are still waiting on a third biopsy specimen to be resulted. We are calling pathology now, I will call you once I know the results. Plan will be for a 3 month follow up Ct chest You will get a call to get this scheduled closer to the time. You will follow up with me 1 week after the scan has been done. If the final biopsy shows a malignancy, I will call you and we will refer you for treatment. I have messaged Bascom Borer to follow up on the renal finding noted on the PET scan.  I have let her know the initial 2 biopsies are negative for malignancy. Please work on quitting smoking.  You can receive free nicotine  replacement therapy (patches, gum, or mints) by calling 1-800-QUIT NOW. Please call so we can get you on the path to becoming a non-smoker. I know it is hard, but you can do this!  Hypnosis for smoking cessation  [employer] [phone]  Acupuncture for smoking cessation  [employer] [phone]

## 2023-08-26 NOTE — Progress Notes (Signed)
 History of Present Illness [name] is a 63 y.o. female some day smoker followed through the lung cancer screening program, here for consultation for an abnormal lung cancer Screening CT Chest.      08/26/2023 Pt. Presents for follow up after Bronchoscopy with biopsies for a slowly enlarging pulmonary nodule. She has been monitored for a right upper lobe lung nodule that has gradually enlarged over the past year and a half, increasing from 6.5 mm to 10.3 mm. Recent imaging includes a CT scan and then a PET scan, which showed an SUV of 1.4, slightly decreased from a previous SUV of 1.9. Per radiology they recommended a 82-month follow-up.  Patient however does not want to do a watchful wait and would rather know definitively if this is a cancer.  She underwent bronchoscopy with biopsies, 08/19/2023. She states she had worsening shortness of breath initially after the procedure.She used Trelegy , and that has helped with her symptoms. She states her breathing is back to baseline.No discolored secretions, no blood in her secretions. No coughing post procedure.   We have reviewed her cytology results. 2 of the Right upper lobe biopsy results are final and were negative for malignancy. All cultures have been negative thus far. There is one that is pending. I have called Pathology. They are working on getting the final biopsy result into the system . I will call [name] with the results once they are posted.  Plan will be for a 3 month follow up CT Chest to continue to monitor the nodule.Pt. is in agreement with the plan. I told her if the thord biopsy is positive for malignancy, I will refer her to radiation oncology and surgery so she can choose the best option for treatment.   I did message her PCP regarding follow up of the renal finding on the PET scan. I have asked her to follow up with imaging recommended by radiology.I will await her response.  We discussed complete smoking  cessation for 3-4 minutes. I have provided resources to help with smoking cessation , see AVS for specifics.She states she is currently smoking about 8 cigarettes per day and is working on quitting.  Test Results: Cytology 08/19/2023 FINAL MICROSCOPIC DIAGNOSIS:  A. LUNG, RUL, FINE NEEDLE ASPIRATION:  - No malignant cells identified  B. LUNG, RUL, BRUSHING:  - No malignant cells identified  C. LUNG, RUL, LAVAGE:  FINAL MICROSCOPIC DIAGNOSIS:  - No malignant cells identified      Latest Ref Rng & Units 08/19/2023   10:31 AM 02/21/2023    2:58 AM 11/09/2022   12:59 PM  CBC  WBC 4.0 - 10.5 K/uL 4.3  3.6  3.5   Hemoglobin 12.0 - 15.0 g/dL 86.2  87.5  87.3   Hematocrit 36.0 - 46.0 % 43.7  37.9  40.6   Platelets 150 - 400 K/uL 203  183  208        Latest Ref Rng & Units 08/19/2023   10:31 AM 02/21/2023    2:58 AM 11/09/2022   12:59 PM  BMP  Glucose 70 - 99 mg/dL 97  73  891   BUN 8 - 23 mg/dL 17  15  14    Creatinine 0.44 - 1.00 mg/dL 8.96  8.81  8.96   BUN/Creat Ratio 6 - 22 (calc)  13    Sodium 135 - 145 mmol/L 140  140  139   Potassium 3.5 - 5.1 mmol/L 4.1  3.9  3.8  Chloride 98 - 111 mmol/L 106  103  105   CO2 22 - 32 mmol/L 25  27  27    Calcium  8.9 - 10.3 mg/dL 9.4  9.9  9.1     BNP No results found for: BNP  ProBNP No results found for: PROBNP  PFT    Component Value Date/Time   FEV1PRE 1.66 04/18/2022 1047   FEV1POST 1.14 04/18/2022 1047   FVCPRE 2.69 04/18/2022 1047   FVCPOST 3.19 04/18/2022 1047   TLC 7.78 04/18/2022 1047   DLCOUNC 0.69 04/18/2022 1047   PREFEV1FVCRT 62 04/18/2022 1047   PSTFEV1FVCRT 36 04/18/2022 1047    DG Chest Port 1 View Result Date: 08/19/2023 CLINICAL DATA:  Status post right upper lobe bronchoscopy and biopsy EXAM: PORTABLE CHEST 1 VIEW COMPARISON:  Chest radiograph dated 11/08/2022 FINDINGS: Mildly low lung volumes. Asymmetric, subtle patchy right apical opacities. Bibasilar patchy opacities. No pleural effusion or pneumothorax.  Enlarged cardiomediastinal silhouette is likely projectional. No acute osseous abnormality. IMPRESSION: 1. Asymmetric, subtle patchy right apical opacities, likely postprocedural. No pneumothorax. 2. Bibasilar patchy opacities, likely atelectasis. Electronically Signed   By: Limin  Xu M.D.   On: 08/19/2023 14:10   DG C-ARM BRONCHOSCOPY Result Date: 08/19/2023 C-ARM BRONCHOSCOPY: Fluoroscopy was utilized by the requesting physician.  No radiographic interpretation.   NM PET Image Initial (PI) Skull Base To Thigh Result Date: 08/03/2023 CLINICAL DATA:  Initial treatment strategy for lung nodule. EXAM: NUCLEAR MEDICINE PET SKULL BASE TO THIGH TECHNIQUE: 7.48 mCi F-18 FDG was injected intravenously. Full-ring PET imaging was performed from the skull base to thigh after the radiotracer. CT data was obtained and used for attenuation correction and anatomic localization. Fasting blood glucose: 89 mg/dl COMPARISON:  Lung cancer screening CT 06/17/2023 and older. Prior PET-CT scan 04/13/2022. FINDINGS: Mediastinal blood pool activity: SUV max 2.2 Liver activity: SUV max 3.1 NECK: No abnormal uptake identified in the neck including along lymph node change of the submandibular posterior triangle or internal jugular region. Near symmetric uptake of the visualized portions of the intracranial compartment. Incidental CT findings: Paranasal sinuses and mastoid air cells are clear. The parotid glands, submandibular glands are unremarkable. Small thyroid gland. Streak artifact related to the patient's dental hardware. The CHEST: No abnormal radiotracer uptake seen above blood pool in the axillary region, hilum or mediastinum. The right upper lobe increasing ill-defined nodular focus is again seen today on series 4, image 65, similar in size measuring 13 by 8 mm. Previously 12 x 8 mm. The there is some minimal uptake in this location maximum SUV of 1.4. On the prior PET-CT this same area has maximum SUV of 1.9. No other areas of  abnormal uptake along lung parenchyma. Incidental CT findings: Breathing motion. No pleural effusion or pneumothorax. Other areas of nodularity along the lungs seen on the prior examination are not as well defined with the motion. Apical pleural thickening. Mild scattered vascular calcifications. Heart is nonenlarged. No pericardial effusion. Normal caliber thoracic esophagus. ABDOMEN/PELVIS: There is physiologic distribution radiotracer along the parenchymal organs, bowel and renal collecting systems. Minimal uptake along the antrum of the stomach. Nonspecific. No abnormal uptake along lymph node change in the abdomen and pelvis. Incidental CT findings: On this limited CT examination, grossly the liver, spleen, adrenal glands and pancreas are unremarkable. Prior cholecystectomy. There is breathing motion. There is some areas of increased density along the lower pole left kidney as well as along the midportion of the kidney laterally, unchanged from previous. The no ureteral stones  identified. Underdistended urinary bladder. Uterus is present. Redundant course to the sigmoid colon. Few sigmoid colon diverticula. Scattered colonic stool. Surgical changes along the base of the cecum. Please correlate with any history such as prior appendectomy. The stomach and small bowel are nondilated. No free air or free fluid. Scattered vascular calcifications. SKELETON: No specific abnormal uptake along the visualized osseous structures. Incidental CT findings: Mild degenerative changes. IMPRESSION: Right upper lobe nodularity has no significant abnormal uptake. The recommend short-term CT surveillance in 3 months. If there is more clinical concern tissue sampling could be considered. No soft tissue or nodal areas of abnormal uptake otherwise. Stable areas of increased density along the mid to lower aspect the left kidney, similar to previous. Recommend dedicated pre and post contrast examination when appropriate with either CT  or MRI. Electronically Signed   By: Ranell Bring M.D.   On: 08/03/2023 10:17     Past medical hx Past Medical History:  Diagnosis Date   Anxiety 11/2018   Asthma    Heart murmur    Hepatitis C    Has been treated   HIV (human immunodeficiency virus infection) (HCC)    Hypertension    STD (sexually transmitted disease)    Substance abuse (HCC)    Underweight 07/29/2014     Social History   Tobacco Use   Smoking status: Some Days    Current packs/day: 0.50    Average packs/day: 0.5 packs/day for 33.0 years (16.5 ttl pk-yrs)    Types: Cigarettes    Passive exposure: Never   Smokeless tobacco: Never   Tobacco comments:     7-8 cigarettes a day-08/26/2023-KRD  Vaping Use   Vaping status: Never Used  Substance Use Topics   Alcohol use: Yes    Alcohol/week: 5.0 standard drinks of alcohol    Types: 5 Cans of beer per week   Drug use: No    Comment: clean for 10 years    [name] reports that she has been smoking cigarettes. She has a 16.5 pack-year smoking history. She has never been exposed to tobacco smoke. She has never used smokeless tobacco. She reports current alcohol use of about 5.0 standard drinks of alcohol per week. She reports that she does not use drugs.  Tobacco Cessation: Ready to quit: Not Answered Counseling given: Not Answered Tobacco comments:  7-8 cigarettes a day-08/26/2023-KRD Current every day smoker , I spent 3-4 minutes counseling patient on  steps to stop use of tobacco products. I have provided patient with information on receiving free nicotine  replacement therapy, and contact numbers for hypnosis for smoking cessation as well as acupuncture for smoking cessation.   Past surgical hx, Family hx, Social hx all reviewed.  Current Outpatient Medications on File Prior to Visit  Medication Sig   acetaminophen  (TYLENOL ) 500 MG tablet Take 2 tablets (1,000 mg total) by mouth every 6 (six) hours as needed for mild pain (pain score 1-3), moderate pain  (pain score 4-6) or headache (or abdominal pain).   amLODipine  (NORVASC ) 5 MG tablet TAKE 1 TABLET(5 MG) BY MOUTH DAILY   benzonatate  (TESSALON ) 100 MG capsule Take 1 capsule (100 mg total) by mouth every 8 (eight) hours.   bictegravir-emtricitabine -tenofovir  AF (BIKTARVY ) 50-200-25 MG TABS tablet Take 1 tablet by mouth daily.   Biotin 5000 MCG TABS Take 5,000 mcg by mouth daily.   buPROPion  (WELLBUTRIN  SR) 150 MG 12 hr tablet Take 1 tablet (150 mg total) by mouth daily. Start with once a day for 3  days then increase to twice a day   cetirizine  (ZYRTEC ) 10 MG tablet Take 10 mg by mouth daily as needed for allergies or rhinitis.   conjugated estrogens  (PREMARIN ) vaginal cream Place 0.25 Applicatorfuls vaginally See admin instructions. Beginning on 10/26/2022, place 0.5 g vaginally at bedtime for two weeks, then twice a week thereafter   fluticasone  (FLONASE ) 50 MCG/ACT nasal spray Place 2 sprays into both nostrils daily as needed for allergies or rhinitis.   Fluticasone -Umeclidin-Vilant (TRELEGY ELLIPTA ) 100-62.5-25 MCG/ACT AEPB INHALE 1 PUFF INTO THE LUNGS EVERY DAY (Patient taking differently: Inhale 1 puff into the lungs daily.)   Fluticasone -Umeclidin-Vilant (TRELEGY ELLIPTA ) 100-62.5-25 MCG/ACT AEPB Inhale 1 puff into the lungs daily.   Fluticasone -Umeclidin-Vilant (TRELEGY ELLIPTA ) 100-62.5-25 MCG/ACT AEPB Inhale 1 puff into the lungs daily at 2 PM.   mirabegron  ER (MYRBETRIQ ) 25 MG TB24 tablet Take 1 tablet (25 mg total) by mouth daily.   Multiple Vitamins-Minerals (MULTIVITAMIN GUMMIES ADULTS PO) Take 1 tablet by mouth daily with breakfast.   Omega-3 Fatty Acids (FISH OIL) 1000 MG CAPS Take 1,000 mg by mouth daily.   omeprazole  (PRILOSEC) 20 MG capsule Take 1 capsule (20 mg total) by mouth daily.   rosuvastatin  (CRESTOR ) 5 MG tablet Take 1 tablet (5 mg total) by mouth daily.   solifenacin  (VESICARE ) 10 MG tablet Take 10 mg by mouth daily.   TUMS E-X 750 750 MG chewable tablet Chew 1 tablet  by mouth 2 (two) times daily as needed for heartburn.   No current facility-administered medications on file prior to visit.     Allergies  Allergen Reactions   Aspirin Other (See Comments)    Heart murmur     Review Of Systems:  Constitutional:   No  weight loss, night sweats,  Fevers, chills, fatigue, or  lassitude.  HEENT:   No headaches,  Difficulty swallowing,  Tooth/dental problems, or  Sore throat,                No sneezing, itching, ear ache, nasal congestion, post nasal drip,   CV:  No chest pain,  Orthopnea, PND, swelling in lower extremities, anasarca, dizziness, palpitations, syncope.   GI  No heartburn, indigestion, abdominal pain, nausea, vomiting, diarrhea, change in bowel habits, loss of appetite, bloody stools.   Resp: No shortness of breath with exertion or at rest.  No excess mucus, no productive cough,  No non-productive cough,  No coughing up of blood.  No change in color of mucus.  No wheezing.  No chest wall deformity  Skin: no rash or lesions.  GU: no dysuria, change in color of urine, no urgency or frequency.  No flank pain, no hematuria   MS:  No joint pain or swelling.  No decreased range of motion.  No back pain.  Psych:  No change in mood or affect. No depression or anxiety.  No memory loss.   Vital Signs BP (!) 152/87 (BP Location: Right Arm, Patient Position: Sitting, Cuff Size: Normal)   Pulse 72   Temp 97.9 F (36.6 C) (Oral)   Ht 5' 10 (1.778 m)   Wt 143 lb 9.6 oz (65.1 kg)   LMP 03/27/2011   SpO2 100%   BMI 20.60 kg/m    Physical Exam:  General- No distress,  A&Ox3, pleasant ENT: No sinus tenderness, TM clear, pale nasal mucosa, no oral exudate,no post nasal drip, no LAN Cardiac: S1, S2, regular rate and rhythm, no murmur Chest: No wheeze/ rales/ dullness; no accessory muscle use, no  nasal flaring, no sternal retractions, clear breath sounds throughout Abd.: Soft Non-tender, ND, BS +, Body mass index is 20.6 kg/m.  Ext: No  clubbing cyanosis, edema, no obvious deformities Neuro:  normal strength, MAE x 4, A&O x 3, Appropriate Skin: No rashes, warm and dry, no obvious skin lesions Psych: normal mood and behavior   Assessment/Plan Lung nodule Right upper lobe lung nodule with two negative biopsies. Awaiting third biopsy results. PET scan indicates slow growth. - Order three-month follow-up CT chest. - Call with results of the third biopsy result when available. - Refer to radiation oncology if third biopsy is positive.  Dyspnea with exertion Intermittent breathing problems post-procedure managed with Trelegy inhaler. - Continue Trelegy inhaler as needed. - Rinse after use  Nicotine  dependence Currently smoking eight cigarettes daily. Attempting cessation. - Counseled x 3-4  minutes on smoking cessation  - Provide information on free nicotine  replacement options, including gum and medications. - Encourage use of smoking cessation resources.  Kidney imaging recommendation Increased density noted on left kidney imaging. - Send message to primary care physician, Tabitha Borer, [name] to evaluate need for kidney imaging.  I spent 30 minutes dedicated to the care of this patient on the date of this encounter to include pre-visit review of records, face-to-face time with the patient discussing conditions above, post visit ordering of testing, clinical documentation with the electronic health record, making appropriate referrals as documented, and communicating necessary information to the patient's healthcare team.   Tabitha JULIANNA Lites, [name] 08/26/2023  9:45 AM   Addendum: The final biopsy was negative for malignancy. I have called the patient and she verbalized understanding. Plan will be for the 3 month follow up 11/2023.Pt. is in agreement

## 2023-08-26 NOTE — Addendum Note (Signed)
 Addended by: MELVENIA WILFORD SAUNDERS on: 08/26/2023 02:52 PM   Modules accepted: Orders

## 2023-09-04 ENCOUNTER — Ambulatory Visit: Payer: Self-pay | Admitting: Internal Medicine

## 2023-09-05 ENCOUNTER — Other Ambulatory Visit: Payer: Self-pay | Admitting: Nurse Practitioner

## 2023-09-10 NOTE — Telephone Encounter (Signed)
 [name]

## 2023-09-18 LAB — FUNGAL ORGANISM REFLEX

## 2023-09-18 LAB — FUNGUS CULTURE WITH STAIN

## 2023-09-18 LAB — FUNGUS CULTURE RESULT

## 2023-09-30 LAB — ACID FAST CULTURE WITH REFLEXED SENSITIVITIES (MYCOBACTERIA): Acid Fast Culture: NEGATIVE

## 2023-10-02 ENCOUNTER — Other Ambulatory Visit: Payer: Self-pay

## 2023-10-02 ENCOUNTER — Other Ambulatory Visit

## 2023-10-02 DIAGNOSIS — Z113 Encounter for screening for infections with a predominantly sexual mode of transmission: Secondary | ICD-10-CM

## 2023-10-02 DIAGNOSIS — B20 Human immunodeficiency virus [HIV] disease: Secondary | ICD-10-CM

## 2023-10-03 LAB — T-HELPER CELL (CD4) - (RCID CLINIC ONLY)
CD4 % Helper T Cell: 26 % — ABNORMAL LOW (ref 33–65)
CD4 T Cell Abs: 317 /uL — ABNORMAL LOW (ref 400–1790)

## 2023-10-04 LAB — COMPLETE METABOLIC PANEL WITHOUT GFR
AG Ratio: 1.6 (calc) (ref 1.0–2.5)
ALT: 15 U/L (ref 6–29)
AST: 21 U/L (ref 10–35)
Albumin: 4.7 g/dL (ref 3.6–5.1)
Alkaline phosphatase (APISO): 126 U/L (ref 37–153)
BUN/Creatinine Ratio: 14 (calc) (ref 6–22)
BUN: 17 mg/dL (ref 7–25)
CO2: 28 mmol/L (ref 20–32)
Calcium: 9.7 mg/dL (ref 8.6–10.4)
Chloride: 105 mmol/L (ref 98–110)
Creat: 1.2 mg/dL — ABNORMAL HIGH (ref 0.50–1.05)
Globulin: 3 g/dL (ref 1.9–3.7)
Glucose, Bld: 78 mg/dL (ref 65–99)
Potassium: 4.5 mmol/L (ref 3.5–5.3)
Sodium: 141 mmol/L (ref 135–146)
Total Bilirubin: 0.5 mg/dL (ref 0.2–1.2)
Total Protein: 7.7 g/dL (ref 6.1–8.1)

## 2023-10-04 LAB — CBC WITH DIFFERENTIAL/PLATELET
Absolute Lymphocytes: 1273 {cells}/uL (ref 850–3900)
Absolute Monocytes: 310 {cells}/uL (ref 200–950)
Basophils Absolute: 9 {cells}/uL (ref 0–200)
Basophils Relative: 0.2 %
Eosinophils Absolute: 52 {cells}/uL (ref 15–500)
Eosinophils Relative: 1.2 %
HCT: 41.2 % (ref 35.0–45.0)
Hemoglobin: 12.9 g/dL (ref 11.7–15.5)
MCH: 29.8 pg (ref 27.0–33.0)
MCHC: 31.3 g/dL — ABNORMAL LOW (ref 32.0–36.0)
MCV: 95.2 fL (ref 80.0–100.0)
MPV: 10.3 fL (ref 7.5–12.5)
Monocytes Relative: 7.2 %
Neutro Abs: 2657 {cells}/uL (ref 1500–7800)
Neutrophils Relative %: 61.8 %
Platelets: 222 Thousand/uL (ref 140–400)
RBC: 4.33 Million/uL (ref 3.80–5.10)
RDW: 12.7 % (ref 11.0–15.0)
Total Lymphocyte: 29.6 %
WBC: 4.3 Thousand/uL (ref 3.8–10.8)

## 2023-10-04 LAB — RPR TITER: RPR Titer: 1:1 {titer} — ABNORMAL HIGH

## 2023-10-04 LAB — HIV-1 RNA QUANT-NO REFLEX-BLD
HIV 1 RNA Quant: NOT DETECTED {copies}/mL
HIV-1 RNA Quant, Log: NOT DETECTED {Log_copies}/mL

## 2023-10-04 LAB — T PALLIDUM AB: T Pallidum Abs: NEGATIVE

## 2023-10-04 LAB — RPR: RPR Ser Ql: REACTIVE — AB

## 2023-10-16 ENCOUNTER — Encounter: Payer: Self-pay | Admitting: Internal Medicine

## 2023-10-16 ENCOUNTER — Other Ambulatory Visit: Payer: Self-pay

## 2023-10-16 ENCOUNTER — Ambulatory Visit (INDEPENDENT_AMBULATORY_CARE_PROVIDER_SITE_OTHER): Admitting: Internal Medicine

## 2023-10-16 VITALS — BP 126/86 | HR 67 | Temp 97.3°F | Ht 70.0 in | Wt 140.0 lb

## 2023-10-16 DIAGNOSIS — B20 Human immunodeficiency virus [HIV] disease: Secondary | ICD-10-CM | POA: Diagnosis not present

## 2023-10-16 DIAGNOSIS — R634 Abnormal weight loss: Secondary | ICD-10-CM | POA: Diagnosis not present

## 2023-10-16 DIAGNOSIS — E440 Moderate protein-calorie malnutrition: Secondary | ICD-10-CM | POA: Diagnosis not present

## 2023-10-16 DIAGNOSIS — Z79899 Other long term (current) drug therapy: Secondary | ICD-10-CM | POA: Diagnosis not present

## 2023-10-16 DIAGNOSIS — R911 Solitary pulmonary nodule: Secondary | ICD-10-CM

## 2023-10-16 MED ORDER — BIKTARVY 50-200-25 MG PO TABS: 1.0000 | ORAL_TABLET | Freq: Every day | ORAL | 11 refills | Status: DC

## 2023-10-16 NOTE — Patient Instructions (Addendum)
  Please schedule a nurse visit for your flu shot some time next month  Mental Health Resources  988: can call or text 24/7  [facility]: Address: [address], [place], [place] [address] Open 24 hours Phone: [phone]  [facility] [place]: Address: [address], [place], [place] [address] Phone: [phone] Appointments: [email or web address]   Smoking Cessation: QuitlineNC 1-800-QUIT-NOW 905-522-4142); Espaol: 1-855-Djelo-Ya (1-682-109-2997) [email or web address].[email or web address]

## 2023-10-16 NOTE — Progress Notes (Signed)
 RFV: follow up for hiv disease Patient ID: [name], female   DOB: 05-12-1960, 62 y.o.   MRN: [id number]  HPI [name] 63yo F with hiv disease, well controlled, on biktarvy . Not missing doses. She has htn, also moderate protein-calorie malnutrition. She states her health is average, difficulty gaining weight. We reviewed her dietary intake.  Coffee morning, work, 15 min break nutrigrain. Eat dinner 6:30 pm, and no bedtime snacks  Outpatient Encounter Medications as of 10/16/2023  Medication Sig   acetaminophen  (TYLENOL ) 500 MG tablet Take 2 tablets (1,000 mg total) by mouth every 6 (six) hours as needed for mild pain (pain score 1-3), moderate pain (pain score 4-6) or headache (or abdominal pain).   amLODipine  (NORVASC ) 5 MG tablet TAKE 1 TABLET(5 MG) BY MOUTH DAILY   bictegravir-emtricitabine -tenofovir  AF (BIKTARVY ) 50-200-25 MG TABS tablet Take 1 tablet by mouth daily.   cetirizine  (ZYRTEC ) 10 MG tablet Take 10 mg by mouth daily as needed for allergies or rhinitis.   conjugated estrogens  (PREMARIN ) vaginal cream Place 0.25 Applicatorfuls vaginally See admin instructions. Beginning on 10/26/2022, place 0.5 g vaginally at bedtime for two weeks, then twice a week thereafter   fluticasone  (FLONASE ) 50 MCG/ACT nasal spray Place 2 sprays into both nostrils daily as needed for allergies or rhinitis.   Fluticasone -Umeclidin-Vilant (TRELEGY ELLIPTA ) 100-62.5-25 MCG/ACT AEPB Inhale 1 puff into the lungs daily.   Omega-3 Fatty Acids (FISH OIL) 1000 MG CAPS Take 1,000 mg by mouth daily.   TUMS E-X 750 750 MG chewable tablet Chew 1 tablet by mouth 2 (two) times daily as needed for heartburn.   benzonatate  (TESSALON ) 100 MG capsule Take 1 capsule (100 mg total) by mouth every 8 (eight) hours. (Patient not taking: Reported on 10/16/2023)   Biotin 5000 MCG TABS Take 5,000 mcg by mouth daily. (Patient not taking: Reported on 10/16/2023)   buPROPion  (WELLBUTRIN  SR) 150 MG 12 hr tablet Take 1 tablet  (150 mg total) by mouth daily. Start with once a day for 3 days then increase to twice a day (Patient not taking: Reported on 10/16/2023)   Fluticasone -Umeclidin-Vilant (TRELEGY ELLIPTA ) 100-62.5-25 MCG/ACT AEPB INHALE 1 PUFF INTO THE LUNGS EVERY DAY (Patient taking differently: Inhale 1 puff into the lungs daily.)   Fluticasone -Umeclidin-Vilant (TRELEGY ELLIPTA ) 100-62.5-25 MCG/ACT AEPB Inhale 1 puff into the lungs daily at 2 PM.   Fluticasone -Umeclidin-Vilant (TRELEGY ELLIPTA ) 100-62.5-25 MCG/ACT AEPB Inhale 1 puff into the lungs daily at 2 PM.   mirabegron  ER (MYRBETRIQ ) 25 MG TB24 tablet Take 1 tablet (25 mg total) by mouth daily. (Patient not taking: Reported on 10/16/2023)   Multiple Vitamins-Minerals (MULTIVITAMIN GUMMIES ADULTS PO) Take 1 tablet by mouth daily with breakfast. (Patient not taking: Reported on 10/16/2023)   omeprazole  (PRILOSEC) 20 MG capsule Take 1 capsule (20 mg total) by mouth daily. (Patient not taking: Reported on 10/16/2023)   rosuvastatin  (CRESTOR ) 5 MG tablet Take 1 tablet (5 mg total) by mouth daily. (Patient not taking: Reported on 10/16/2023)   solifenacin  (VESICARE ) 10 MG tablet Take 10 mg by mouth daily. (Patient not taking: Reported on 10/16/2023)   No facility-administered encounter medications on file as of 10/16/2023.     Patient Active Problem List   Diagnosis Date Noted   Lung nodule seen on imaging study 08/06/2023   Acute low back pain without sciatica 12/19/2022   Chest pain 11/08/2022   Abnormal EKG 11/08/2022   Screening for cervical cancer 04/16/2022   Weight loss 04/16/2022   Nonintractable headache 08/10/2021  Cystocele with prolapse 07/21/2020   Callus of foot 12/12/2016   Multiple renal cysts 08/14/2016   Constipation 11/30/2014   Essential hypertension 11/30/2014   Hot flashes, menopausal 01/27/2014   Asthma 01/23/2011   HEADACHE 04/14/2008   CHOLELITHIASIS 12/15/2007   PERIPHERAL NEUROPATHY 11/07/2007   Avulsion fracture of tooth 07/23/2007    Depression 07/24/2006   Human immunodeficiency virus (HIV) disease (HCC) 12/14/2005   MACROCYTIC ANEMIA 12/14/2005   Tobacco use 12/14/2005   SUBSTANCE ABUSE 12/14/2005   GERD 12/14/2005   PNEUMONIA, HX OF 12/14/2005   COLPOSCOPY, HX OF 12/14/2005     Health Maintenance Due  Topic Date Due   Zoster Vaccines- Shingrix (1 of 2) Never done   Pneumococcal Vaccine: 50+ Years (4 of 4 - PCV20 or PCV21) 07/04/2022   INFLUENZA VACCINE  09/13/2023   COVID-19 Vaccine (5 - 2025-26 season) 10/14/2023     Review of Systems + 12 point ros is otherwise negative except weight loss, but due to less intake Physical Exam   BP 126/86   Pulse 67   Temp (!) 97.3 F (36.3 C) (Temporal)   Ht 5' 10 (1.778 m)   Wt 140 lb (63.5 kg)   LMP 03/27/2011   SpO2 97%   BMI 20.09 kg/m   Physical Exam  Constitutional:  oriented to person, place, and time. appears well-developed and well-nourished. No distress.  HENT: [place]/AT, PERRLA, no scleral icterus Mouth/Throat: Oropharynx is clear and moist. No oropharyngeal exudate.  Cardiovascular: Normal rate, regular rhythm and normal heart sounds. Exam reveals no gallop and no friction rub.  No murmur heard.  Pulmonary/Chest: Effort normal and breath sounds normal. No respiratory distress.  has no wheezes.  Neck = supple, no nuchal rigidity Abdominal: Soft. Bowel sounds are normal.  exhibits no distension. There is no tenderness.  Lymphadenopathy: no cervical adenopathy. No axillary adenopathy Neurological: alert and oriented to person, place, and time.  Skin: Skin is warm and dry. No rash noted. No erythema.  Psychiatric: a normal mood and affect.  behavior is normal.   Lab Results  Component Value Date   CD4TCELL 26 (L) 10/02/2023   Lab Results  Component Value Date   CD4TABS 317 (L) 10/02/2023   CD4TABS 415 08/28/2022   CD4TABS 356 (L) 02/15/2022   Lab Results  Component Value Date   HIV1RNAQUANT NOT DETECTED 10/02/2023   Lab Results  Component  Value Date   HEPBSAB YES 04/08/2006   Lab Results  Component Value Date   LABRPR REACTIVE (A) 10/02/2023    CBC Lab Results  Component Value Date   WBC 4.3 10/02/2023   RBC 4.33 10/02/2023   HGB 12.9 10/02/2023   HCT 41.2 10/02/2023   PLT 222 10/02/2023   MCV 95.2 10/02/2023   MCH 29.8 10/02/2023   MCHC 31.3 (L) 10/02/2023   RDW 12.7 10/02/2023   LYMPHSABS 1,531 08/28/2022   MONOABS 0.7 07/20/2018   EOSABS 52 10/02/2023    BMET Lab Results  Component Value Date   NA 141 10/02/2023   K 4.5 10/02/2023   CL 105 10/02/2023   CO2 28 10/02/2023   GLUCOSE 78 10/02/2023   BUN 17 10/02/2023   CREATININE 1.20 (H) 10/02/2023   CALCIUM  9.7 10/02/2023   GFRNONAA >60 08/19/2023   GFRAA 74 07/20/2019      Assessment and Plan  Hiv disease = recent labs reviewed shows that she is well controlled. CD 4count slightly lower than last year.  Long term medication = cr stable, no  need to change medications  Moderate protein-calorie malnutrition = recommend to increase caloric intake with more snacks and additional meal per day  Health maintenance - flu shot next month  Lung nodule = needs repeat Chest CT in 1 month as follow up to pet scan

## 2023-11-11 ENCOUNTER — Other Ambulatory Visit: Payer: Self-pay | Admitting: Nurse Practitioner

## 2023-11-11 DIAGNOSIS — N2889 Other specified disorders of kidney and ureter: Secondary | ICD-10-CM

## 2023-12-02 ENCOUNTER — Encounter: Payer: Self-pay | Admitting: Acute Care

## 2023-12-02 ENCOUNTER — Telehealth: Payer: Self-pay | Admitting: Acute Care

## 2023-12-02 NOTE — Telephone Encounter (Signed)
 CT is scheduled. The scheduler I had spoken to had told me the information about the packs per year. I am new at doing this so I didn't catch that that scheduler was looking at the other order. My apologies for any confusion. NFN

## 2023-12-02 NOTE — Telephone Encounter (Signed)
**Note De-identified  [place] Obfuscation** Please advise 

## 2023-12-02 NOTE — Telephone Encounter (Signed)
 Patient does not qualify for this test because of the 20 packs per year criteria. I see 16.5 listed for her. Please advise

## 2023-12-02 NOTE — Telephone Encounter (Signed)
 Lauraine Lites NP ordered a CT Chest without contrast on 08/26/2023 to be completed in mid October 2025. The smoking history is not needed for this scan, only the noted 10mm RUL nodule. If/when patient is able to resume LCS we can use her smoking history. Per the LDCT she had in 06/2023 she was a current smoker with a 24 pack year history. The patient's smoking history in her chart was incorrect stating 16 pack year, I have corrected this to reflect her current smoking history. Thank you Erin.

## 2023-12-11 ENCOUNTER — Ambulatory Visit (HOSPITAL_COMMUNITY): Admission: RE | Admit: 2023-12-11 | Source: Ambulatory Visit

## 2024-02-20 ENCOUNTER — Ambulatory Visit: Payer: Self-pay

## 2024-02-20 NOTE — Telephone Encounter (Signed)
 FYI Only or Action Required?: Action required by provider: request for appointment.  Patient was last seen in primary care on 06/18/2023 by Oley Bascom RAMAN, NP.  Called Nurse Triage reporting Eye Problem.  Symptoms began several months ago.  Interventions attempted: Nothing.  Symptoms are: unchanged.  Triage Disposition: See HCP Within 4 Hours (Or PCP Triage)  Patient/caregiver understands and will follow disposition?: Yes  Copied from CRM #[id number]. Topic: Clinical - Red Word Triage >> Feb 20, 2024  3:39 PM Donna BRAVO wrote: Red Word that prompted transfer to Nurse Triage:  Patient has high BP  blurry vision every so often and burning during the day, sometimes all day.  Headache Urinating more than usual, only a little comes out Reason for Disposition  [1] Eye pain AND [2] brief (now gone) blurred vision or visual changes  Answer Assessment - Initial Assessment Questions No available appointments within dispo. Patient will need called for work but she works during the day. Her lunch break is 10:45-11:15 daily and she can not answer any other time than this. She needs an appointment after 3:15 any day of the week. 1. SYMPTOM: What's the main symptom you're concerned about? (e.g., frequency, incontinence)     Urinary frequency , Urine is dark 2. ONSET: When did the  urinary freuqency  start?     6 months 3. PAIN: Is there any pain? If Yes, ask: How bad is it? (Scale: 1-10; mild, moderate, severe)     In middle of stomach, under ribs 6, pain comes and goes 4. CAUSE: What do you think is causing the symptoms?     Unsure 5. OTHER SYMPTOMS: Do you have any other symptoms? (e.g., blood in urine, fever, flank pain, pain with urination)     Hx Acid reflux.  Answer Assessment - Initial Assessment Questions 1. DESCRIPTION: How has your vision changed? (e.g., complete vision loss, blurred vision, double vision, floaters, etc.)     Blurriness that comes and goes. Is worst when  she is outside in the sun. 2. LOCATION: One or both eyes? If one, ask: Which eye?     She believes only in right eye 3. SEVERITY: Can you see anything? If Yes, ask: What can you see? (e.g., fine print)     Has to focus hard to see 4. ONSET: When did this begin? Did it start suddenly or has this been gradual?    October-November 5. PATTERN: Does this come and go, or has it been constant since it started?     Comes and goes 6. PAIN: Is there any pain in your eye(s)?  (Scale 1-10; or mild, moderate, severe)     Burning 7. CONTACTS-GLASSES: Do you wear contacts or glasses?     Glasses 8. CAUSE: What do you think is causing this visual problem?     Unsure 9. OTHER SYMPTOMS: Do you have any other symptoms? (e.g., confusion, headache, arm or leg weakness, speech problems)     Denies  Protocols used: Vision Loss or Change-A-AH, Urinary Symptoms-A-AH

## 2024-02-21 NOTE — Telephone Encounter (Signed)
 Left message asking for a return call back in regards to getting sooner appointment. CB.

## 2024-02-21 NOTE — Telephone Encounter (Signed)
 FYI Only or Action Required?: Action required by provider: request for appointment. Ppt transferred to office to schedule  Patient was last seen in primary care on 06/18/2023 by Oley Bascom RAMAN, NP.  Called Nurse Triage reporting Eye Problem.  Symptoms began No triage.  Interventions attempted: Other: No triage.  Symptoms are: gradually improving.  Triage Disposition: See HCP Within 4 Hours (Or PCP Triage)  Patient/caregiver understands and will follow disposition?: Yes   Triaged yesterday. Reports symptoms the same except headaches and blurry vision have improved. Pt returning a missed call from CMA at the office today to get scheduled for an appt that fits her work schedule. Called [facility] and spoke with Macario, transferred pt over to schedule appt.

## 2024-02-28 ENCOUNTER — Ambulatory Visit: Payer: Self-pay | Admitting: Nurse Practitioner

## 2024-02-28 ENCOUNTER — Encounter: Payer: Self-pay | Admitting: Nurse Practitioner

## 2024-02-28 VITALS — BP 140/83 | HR 75 | Temp 97.4°F | Wt 144.8 lb

## 2024-02-28 DIAGNOSIS — R911 Solitary pulmonary nodule: Secondary | ICD-10-CM | POA: Diagnosis not present

## 2024-02-28 DIAGNOSIS — I10 Essential (primary) hypertension: Secondary | ICD-10-CM | POA: Diagnosis not present

## 2024-02-28 DIAGNOSIS — F17200 Nicotine dependence, unspecified, uncomplicated: Secondary | ICD-10-CM

## 2024-02-28 DIAGNOSIS — J0110 Acute frontal sinusitis, unspecified: Secondary | ICD-10-CM | POA: Diagnosis not present

## 2024-02-28 MED ORDER — OLOPATADINE HCL 0.1 % OP SOLN: 1.0000 [drp] | Freq: Two times a day (BID) | OPHTHALMIC | 12 refills | Status: AC

## 2024-02-28 MED ORDER — AMLODIPINE BESYLATE 10 MG PO TABS: 10.0000 mg | ORAL_TABLET | Freq: Every day | ORAL | 2 refills | Status: AC

## 2024-02-28 MED ORDER — AZITHROMYCIN 250 MG PO TABS: ORAL_TABLET | ORAL | 0 refills | Status: AC

## 2024-02-28 MED ORDER — FLUTICASONE PROPIONATE 50 MCG/ACT NA SUSP: 2.0000 | Freq: Every day | NASAL | 2 refills | Status: AC | PRN

## 2024-02-28 MED ORDER — OMEPRAZOLE 20 MG PO CPDR: 20.0000 mg | DELAYED_RELEASE_CAPSULE | Freq: Every day | ORAL | 3 refills | Status: AC

## 2024-02-28 MED ORDER — SOLIFENACIN SUCCINATE 10 MG PO TABS: 10.0000 mg | ORAL_TABLET | Freq: Every day | ORAL | 2 refills | Status: AC

## 2024-02-28 NOTE — Progress Notes (Signed)
 "  Subjective   Patient ID: [name], female    DOB: 1960/05/24, 64 y.o.   MRN: [id number]  Chief Complaint  Patient presents with   Blurred Vision    With HA's think has sinus infection x before christmas but has worsen.     Referring provider: Oley Bascom RAMAN, NP  [name] is a 64 y.o. female with Past Medical History: 11/2018: Anxiety No date: Asthma No date: Heart murmur No date: Hepatitis C     Comment:  Has been treated No date: HIV (human immunodeficiency virus infection) (HCC) No date: Hypertension No date: STD (sexually transmitted disease) No date: Substance abuse (HCC) 07/29/2014: Underweight   HPI  Patient presents today for follow-up and sick visit.  She has been having ongoing sinus congestion pressure and pain since before Christmas.  We will trial azithromycin .  Patient does need a follow-up in 1 cancer screening.  Will but will place orders today.  Patient will need labs and refills today. Denies f/c/s, n/v/d, hemoptysis, PND, leg swelling Denies chest pain or edema       Allergies[1]  Immunization History  Administered Date(s) Administered   Hepatitis A 03/06/2002   Hepatitis B 01/05/2003, 02/04/2003, 07/01/2003   Influenza Split 12/01/2010, 12/05/2011   Influenza Whole 10/24/2005, 11/07/2007, 12/01/2008, 11/30/2009   Influenza, Seasonal, Injecte, Preservative Fre 11/02/2022   Influenza,inj,Quad PF,6+ Mos 12/31/2012, 09/30/2013, 11/30/2014, 11/14/2015, 12/12/2016, 11/26/2017, 11/10/2018, 10/22/2019, 11/16/2021, 12/14/2021   PFIZER(Purple Top)SARS-COV-2 Vaccination 05/14/2019, 06/09/2019, 12/07/2019   Pfizer(Comirnaty)Fall Seasonal Vaccine 12 years and older 02/15/2022   Pneumococcal Conjugate-13 07/03/2017   Pneumococcal Polysaccharide-23 03/14/2010, 11/14/2015   Tdap 06/19/2016    Tobacco History: Tobacco Use History[2] Ready to quit: Not Answered Counseling given: Not Answered Tobacco comments:  7-8 cigarettes a  day-08/26/2023-KRD   Outpatient Encounter Medications as of 02/28/2024  Medication Sig   acetaminophen  (TYLENOL ) 500 MG tablet Take 2 tablets (1,000 mg total) by mouth every 6 (six) hours as needed for mild pain (pain score 1-3), moderate pain (pain score 4-6) or headache (or abdominal pain).   amLODipine  (NORVASC ) 10 MG tablet Take 1 tablet (10 mg total) by mouth daily.   [EXPIRED] azithromycin  (ZITHROMAX ) 250 MG tablet Take 2 tablets on day 1, then 1 tablet daily on days 2 through 5   Biotin 5000 MCG TABS Take 5,000 mcg by mouth daily.   conjugated estrogens  (PREMARIN ) vaginal cream Place 0.25 Applicatorfuls vaginally See admin instructions. Beginning on 10/26/2022, place 0.5 g vaginally at bedtime for two weeks, then twice a week thereafter   Fluticasone -Umeclidin-Vilant (TRELEGY ELLIPTA ) 100-62.5-25 MCG/ACT AEPB INHALE 1 PUFF INTO THE LUNGS EVERY DAY   Fluticasone -Umeclidin-Vilant (TRELEGY ELLIPTA ) 100-62.5-25 MCG/ACT AEPB Inhale 1 puff into the lungs daily.   Fluticasone -Umeclidin-Vilant (TRELEGY ELLIPTA ) 100-62.5-25 MCG/ACT AEPB Inhale 1 puff into the lungs daily at 2 PM.   Fluticasone -Umeclidin-Vilant (TRELEGY ELLIPTA ) 100-62.5-25 MCG/ACT AEPB Inhale 1 puff into the lungs daily at 2 PM.   Multiple Vitamins-Minerals (MULTIVITAMIN GUMMIES ADULTS PO) Take 1 tablet by mouth daily with breakfast.   olopatadine  (PATADAY ) 0.1 % ophthalmic solution Place 1 drop into both eyes 2 (two) times daily.   Omega-3 Fatty Acids (FISH OIL) 1000 MG CAPS Take 1,000 mg by mouth daily.   TUMS E-X 750 750 MG chewable tablet Chew 1 tablet by mouth 2 (two) times daily as needed for heartburn.   [DISCONTINUED] amLODipine  (NORVASC ) 5 MG tablet TAKE 1 TABLET(5 MG) BY MOUTH DAILY   [DISCONTINUED] bictegravir-emtricitabine -tenofovir  AF (BIKTARVY ) 50-200-25 MG TABS tablet  Take 1 tablet by mouth daily.   [DISCONTINUED] fluticasone  (FLONASE ) 50 MCG/ACT nasal spray Place 2 sprays into both nostrils daily as needed for  allergies or rhinitis.   benzonatate  (TESSALON ) 100 MG capsule Take 1 capsule (100 mg total) by mouth every 8 (eight) hours. (Patient not taking: Reported on 02/28/2024)   buPROPion  (WELLBUTRIN  SR) 150 MG 12 hr tablet Take 1 tablet (150 mg total) by mouth daily. Start with once a day for 3 days then increase to twice a day (Patient not taking: Reported on 02/28/2024)   cetirizine  (ZYRTEC ) 10 MG tablet Take 10 mg by mouth daily as needed for allergies or rhinitis. (Patient not taking: Reported on 02/28/2024)   fluticasone  (FLONASE ) 50 MCG/ACT nasal spray Place 2 sprays into both nostrils daily as needed for allergies or rhinitis.   mirabegron  ER (MYRBETRIQ ) 25 MG TB24 tablet Take 1 tablet (25 mg total) by mouth daily. (Patient not taking: Reported on 02/28/2024)   omeprazole  (PRILOSEC) 20 MG capsule Take 1 capsule (20 mg total) by mouth daily.   rosuvastatin  (CRESTOR ) 5 MG tablet Take 1 tablet (5 mg total) by mouth daily. (Patient not taking: Reported on 02/28/2024)   solifenacin  (VESICARE ) 10 MG tablet Take 1 tablet (10 mg total) by mouth daily.   [DISCONTINUED] omeprazole  (PRILOSEC) 20 MG capsule Take 1 capsule (20 mg total) by mouth daily. (Patient not taking: Reported on 02/28/2024)   [DISCONTINUED] solifenacin  (VESICARE ) 10 MG tablet Take 10 mg by mouth daily. (Patient not taking: Reported on 02/28/2024)   No facility-administered encounter medications on file as of 02/28/2024.    Review of Systems  Review of Systems  Constitutional: Negative.   HENT:  Positive for congestion, sinus pressure and sinus pain.   Cardiovascular: Negative.   Gastrointestinal: Negative.   Allergic/Immunologic: Negative.   Neurological: Negative.   Psychiatric/Behavioral: Negative.       Objective:   BP (!) 140/83   Pulse 75   Temp (!) 97.4 F (36.3 C) (Temporal)   Wt 144 lb 12.8 oz (65.7 kg)   LMP 03/27/2011   SpO2 96%   BMI 20.78 kg/m   Wt Readings from Last 5 Encounters:  02/28/24 144 lb 12.8 oz (65.7  kg)  10/16/23 140 lb (63.5 kg)  08/26/23 143 lb 9.6 oz (65.1 kg)  08/19/23 140 lb (63.5 kg)  08/06/23 142 lb (64.4 kg)     Physical Exam Vitals and nursing note reviewed.  Constitutional:      General: She is not in acute distress.    Appearance: She is well-developed.  HENT:     Nose: Congestion present.  Cardiovascular:     Rate and Rhythm: Normal rate and regular rhythm.  Pulmonary:     Effort: Pulmonary effort is normal.     Breath sounds: Normal breath sounds.  Neurological:     Mental Status: She is alert and oriented to person, place, and time.       Assessment & Plan:   Essential hypertension -     amLODIPine  Besylate; Take 1 tablet (10 mg total) by mouth daily.  Dispense: 90 tablet; Refill: 2 -     CBC -     Comprehensive metabolic panel with GFR -     Lipid panel  Acute non-recurrent frontal sinusitis -     Fluticasone  Propionate; Place 2 sprays into both nostrils daily as needed for allergies or rhinitis.  Dispense: 15.8 mL; Refill: 2 -     Azithromycin ; Take 2 tablets on day 1,  then 1 tablet daily on days 2 through 5  Dispense: 6 tablet; Refill: 0  Lung nodule seen on imaging study -     Ambulatory Referral for Lung Cancer Scre  Current smoker -     Ambulatory Referral for Lung Cancer Scre  Other orders -     Omeprazole ; Take 1 capsule (20 mg total) by mouth daily.  Dispense: 30 capsule; Refill: 3 -     Olopatadine  HCl; Place 1 drop into both eyes 2 (two) times daily.  Dispense: 5 mL; Refill: 12 -     Solifenacin  Succinate; Take 1 tablet (10 mg total) by mouth daily.  Dispense: 90 tablet; Refill: 2     Return in about 6 months (around 08/27/2024).   Bascom GORMAN Borer, NP 03/11/2024     [1]  Allergies Allergen Reactions   Aspirin Other (See Comments)    Heart murmur   [2]  Social History Tobacco Use  Smoking Status Some Days   Current packs/day: 0.75   Average packs/day: 0.8 packs/day for 33.0 years (24.8 ttl pk-yrs)   Types: Cigarettes    Passive exposure: Never  Smokeless Tobacco Never  Tobacco Comments    7-8 cigarettes a day-08/26/2023-KRD   "

## 2024-02-28 NOTE — Progress Notes (Signed)
 "  Subjective   Patient ID: [name], female    DOB: August 09, 1960, 64 y.o.   MRN: [id number]  Chief Complaint  Patient presents with   Blurred Vision    With HA's think has sinus infection x before christmas but has worsen.     Referring provider: Oley Bascom RAMAN, NP  [name] is a 64 y.o. female with Past Medical History: 11/2018: Anxiety No date: Asthma No date: Heart murmur No date: Hepatitis C     Comment:  Has been treated No date: HIV (human immunodeficiency virus infection) (HCC) No date: Hypertension No date: STD (sexually transmitted disease) No date: Substance abuse (HCC) 07/29/2014: Underweight  HPI: HPI  Allergies[1]  Immunization History  Administered Date(s) Administered   Hepatitis A 03/06/2002   Hepatitis B 01/05/2003, 02/04/2003, 07/01/2003   Influenza Split 12/01/2010, 12/05/2011   Influenza Whole 10/24/2005, 11/07/2007, 12/01/2008, 11/30/2009   Influenza, Seasonal, Injecte, Preservative Fre 11/02/2022   Influenza,inj,Quad PF,6+ Mos 12/31/2012, 09/30/2013, 11/30/2014, 11/14/2015, 12/12/2016, 11/26/2017, 11/10/2018, 10/22/2019, 11/16/2021, 12/14/2021   PFIZER(Purple Top)SARS-COV-2 Vaccination 05/14/2019, 06/09/2019, 12/07/2019   Pfizer(Comirnaty)Fall Seasonal Vaccine 12 years and older 02/15/2022   Pneumococcal Conjugate-13 07/03/2017   Pneumococcal Polysaccharide-23 03/14/2010, 11/14/2015   Tdap 06/19/2016    Tobacco History: Tobacco Use History[2] Ready to quit: Not Answered Counseling given: Not Answered Tobacco comments:  7-8 cigarettes a day-08/26/2023-KRD   Outpatient Encounter Medications as of 02/28/2024  Medication Sig   acetaminophen  (TYLENOL ) 500 MG tablet Take 2 tablets (1,000 mg total) by mouth every 6 (six) hours as needed for mild pain (pain score 1-3), moderate pain (pain score 4-6) or headache (or abdominal pain).   amLODipine  (NORVASC ) 5 MG tablet TAKE 1 TABLET(5 MG) BY MOUTH DAILY    bictegravir-emtricitabine -tenofovir  AF (BIKTARVY ) 50-200-25 MG TABS tablet Take 1 tablet by mouth daily.   Biotin 5000 MCG TABS Take 5,000 mcg by mouth daily.   conjugated estrogens  (PREMARIN ) vaginal cream Place 0.25 Applicatorfuls vaginally See admin instructions. Beginning on 10/26/2022, place 0.5 g vaginally at bedtime for two weeks, then twice a week thereafter   fluticasone  (FLONASE ) 50 MCG/ACT nasal spray Place 2 sprays into both nostrils daily as needed for allergies or rhinitis.   Fluticasone -Umeclidin-Vilant (TRELEGY ELLIPTA ) 100-62.5-25 MCG/ACT AEPB INHALE 1 PUFF INTO THE LUNGS EVERY DAY   Fluticasone -Umeclidin-Vilant (TRELEGY ELLIPTA ) 100-62.5-25 MCG/ACT AEPB Inhale 1 puff into the lungs daily.   Fluticasone -Umeclidin-Vilant (TRELEGY ELLIPTA ) 100-62.5-25 MCG/ACT AEPB Inhale 1 puff into the lungs daily at 2 PM.   Fluticasone -Umeclidin-Vilant (TRELEGY ELLIPTA ) 100-62.5-25 MCG/ACT AEPB Inhale 1 puff into the lungs daily at 2 PM.   Multiple Vitamins-Minerals (MULTIVITAMIN GUMMIES ADULTS PO) Take 1 tablet by mouth daily with breakfast.   Omega-3 Fatty Acids (FISH OIL) 1000 MG CAPS Take 1,000 mg by mouth daily.   TUMS E-X 750 750 MG chewable tablet Chew 1 tablet by mouth 2 (two) times daily as needed for heartburn.   benzonatate  (TESSALON ) 100 MG capsule Take 1 capsule (100 mg total) by mouth every 8 (eight) hours. (Patient not taking: Reported on 02/28/2024)   buPROPion  (WELLBUTRIN  SR) 150 MG 12 hr tablet Take 1 tablet (150 mg total) by mouth daily. Start with once a day for 3 days then increase to twice a day (Patient not taking: Reported on 02/28/2024)   cetirizine  (ZYRTEC ) 10 MG tablet Take 10 mg by mouth daily as needed for allergies or rhinitis. (Patient not taking: Reported on 02/28/2024)   mirabegron  ER (MYRBETRIQ ) 25 MG TB24 tablet Take 1 tablet (25  mg total) by mouth daily. (Patient not taking: Reported on 02/28/2024)   omeprazole  (PRILOSEC) 20 MG capsule Take 1 capsule (20 mg total) by  mouth daily. (Patient not taking: Reported on 02/28/2024)   rosuvastatin  (CRESTOR ) 5 MG tablet Take 1 tablet (5 mg total) by mouth daily. (Patient not taking: Reported on 02/28/2024)   solifenacin  (VESICARE ) 10 MG tablet Take 10 mg by mouth daily. (Patient not taking: Reported on 02/28/2024)   No facility-administered encounter medications on file as of 02/28/2024.    Review of Systems  Review of Systems   Objective:   Wt 144 lb 12.8 oz (65.7 kg)   LMP 03/27/2011   BMI 20.78 kg/m   Wt Readings from Last 5 Encounters:  02/28/24 144 lb 12.8 oz (65.7 kg)  10/16/23 140 lb (63.5 kg)  08/26/23 143 lb 9.6 oz (65.1 kg)  08/19/23 140 lb (63.5 kg)  08/06/23 142 lb (64.4 kg)     Physical Exam    Assessment & Plan:   Acute non-recurrent frontal sinusitis     No follow-ups on file.   Zygmunt Mcglinn P Indio Santilli, CMA 02/28/2024     [1]  Allergies Allergen Reactions   Aspirin Other (See Comments)    Heart murmur   [2]  Social History Tobacco Use  Smoking Status Some Days   Current packs/day: 0.75   Average packs/day: 0.8 packs/day for 33.0 years (24.8 ttl pk-yrs)   Types: Cigarettes   Passive exposure: Never  Smokeless Tobacco Never  Tobacco Comments    7-8 cigarettes a day-08/26/2023-KRD   "

## 2024-02-29 LAB — COMPREHENSIVE METABOLIC PANEL WITH GFR
ALT: 14 IU/L (ref 0–32)
AST: 25 IU/L (ref 0–40)
Albumin: 4.7 g/dL (ref 3.9–4.9)
Alkaline Phosphatase: 133 IU/L (ref 49–135)
BUN/Creatinine Ratio: 11 — ABNORMAL LOW (ref 12–28)
BUN: 12 mg/dL (ref 8–27)
Bilirubin Total: 0.5 mg/dL (ref 0.0–1.2)
CO2: 23 mmol/L (ref 20–29)
Calcium: 9.7 mg/dL (ref 8.7–10.3)
Chloride: 103 mmol/L (ref 96–106)
Creatinine, Ser: 1.11 mg/dL — ABNORMAL HIGH (ref 0.57–1.00)
Globulin, Total: 2.7 g/dL (ref 1.5–4.5)
Glucose: 77 mg/dL (ref 70–99)
Potassium: 4.5 mmol/L (ref 3.5–5.2)
Sodium: 142 mmol/L (ref 134–144)
Total Protein: 7.4 g/dL (ref 6.0–8.5)
eGFR: 56 mL/min/1.73 — ABNORMAL LOW

## 2024-02-29 LAB — CBC
Hematocrit: 40.1 % (ref 34.0–46.6)
Hemoglobin: 12.7 g/dL (ref 11.1–15.9)
MCH: 29.7 pg (ref 26.6–33.0)
MCHC: 31.7 g/dL (ref 31.5–35.7)
MCV: 94 fL (ref 79–97)
Platelets: 206 x10E3/uL (ref 150–450)
RBC: 4.28 x10E6/uL (ref 3.77–5.28)
RDW: 11.8 % (ref 11.7–15.4)
WBC: 4 x10E3/uL (ref 3.4–10.8)

## 2024-02-29 LAB — LIPID PANEL
Chol/HDL Ratio: 1.8 ratio (ref 0.0–4.4)
Cholesterol, Total: 136 mg/dL (ref 100–199)
HDL: 77 mg/dL
LDL Chol Calc (NIH): 44 mg/dL (ref 0–99)
Triglycerides: 76 mg/dL (ref 0–149)
VLDL Cholesterol Cal: 15 mg/dL (ref 5–40)

## 2024-03-02 ENCOUNTER — Ambulatory Visit: Payer: Self-pay | Admitting: Nurse Practitioner

## 2024-03-06 ENCOUNTER — Other Ambulatory Visit: Payer: Self-pay | Admitting: Internal Medicine

## 2024-03-06 DIAGNOSIS — B20 Human immunodeficiency virus [HIV] disease: Secondary | ICD-10-CM

## 2024-03-07 ENCOUNTER — Ambulatory Visit (HOSPITAL_BASED_OUTPATIENT_CLINIC_OR_DEPARTMENT_OTHER)

## 2024-03-16 ENCOUNTER — Ambulatory Visit: Admitting: Acute Care

## 2024-03-17 ENCOUNTER — Ambulatory Visit: Admitting: Acute Care

## 2024-03-17 ENCOUNTER — Ambulatory Visit
Admission: RE | Admit: 2024-03-17 | Discharge: 2024-03-17 | Disposition: A | Source: Ambulatory Visit | Attending: Acute Care | Admitting: Acute Care

## 2024-03-17 DIAGNOSIS — R911 Solitary pulmonary nodule: Secondary | ICD-10-CM

## 2024-03-18 ENCOUNTER — Other Ambulatory Visit: Payer: Self-pay | Admitting: Pharmacist

## 2024-03-18 MED ORDER — BICTEGRAVIR-EMTRICITAB-TENOFOV 50-200-25 MG PO TABS: 1.0000 | ORAL_TABLET | Freq: Every day | ORAL | Status: AC

## 2024-03-18 NOTE — Progress Notes (Signed)
 Medication Samples have been provided to the patient.  Drug name: Biktarvy         Strength: 50/200/25 mg       Qty: 14 tablets (2 bottles) LOT: [id number]   Exp.Date: 1/28  Samples requested by Leslee Code.  Dosing instructions: Take one tablet by mouth once daily  The patient has been instructed regarding the correct time, dose, and frequency of taking this medication, including desired effects and most common side effects.   Alan Geralds, PharmD, CPP, BCIDP, AAHIVP Clinical Pharmacist Practitioner Infectious Diseases Clinical Pharmacist [facility]

## 2024-03-19 ENCOUNTER — Other Ambulatory Visit: Payer: Self-pay

## 2024-03-19 DIAGNOSIS — B20 Human immunodeficiency virus [HIV] disease: Secondary | ICD-10-CM

## 2024-03-19 MED ORDER — BIKTARVY 50-200-25 MG PO TABS: 1.0000 | ORAL_TABLET | Freq: Every day | ORAL | 1 refills | Status: AC

## 2024-04-07 ENCOUNTER — Ambulatory Visit: Admitting: Acute Care

## 2024-04-16 ENCOUNTER — Other Ambulatory Visit

## 2024-04-30 ENCOUNTER — Ambulatory Visit: Payer: Self-pay | Admitting: Internal Medicine

## 2024-06-17 ENCOUNTER — Ambulatory Visit: Payer: Self-pay | Admitting: Nurse Practitioner

## 2024-08-27 ENCOUNTER — Ambulatory Visit: Payer: Self-pay | Admitting: Nurse Practitioner
